# Patient Record
Sex: Female | Born: 1935 | Race: White | Hispanic: No | Smoking: Never smoker
Health system: Southern US, Community
[De-identification: ages and names within clinical notes are randomized; demographics above are authoritative.]

## PROBLEM LIST (undated history)

## (undated) DIAGNOSIS — R05 Cough: Secondary | ICD-10-CM

## (undated) DIAGNOSIS — K219 Gastro-esophageal reflux disease without esophagitis: Secondary | ICD-10-CM

## (undated) DIAGNOSIS — G43909 Migraine, unspecified, not intractable, without status migrainosus: Secondary | ICD-10-CM

## (undated) DIAGNOSIS — K449 Diaphragmatic hernia without obstruction or gangrene: Secondary | ICD-10-CM

## (undated) DIAGNOSIS — J449 Chronic obstructive pulmonary disease, unspecified: Secondary | ICD-10-CM

## (undated) DIAGNOSIS — E871 Hypo-osmolality and hyponatremia: Secondary | ICD-10-CM

## (undated) DIAGNOSIS — I4891 Unspecified atrial fibrillation: Secondary | ICD-10-CM

## (undated) DIAGNOSIS — R053 Chronic cough: Secondary | ICD-10-CM

## (undated) DIAGNOSIS — F419 Anxiety disorder, unspecified: Secondary | ICD-10-CM

## (undated) DIAGNOSIS — R32 Unspecified urinary incontinence: Secondary | ICD-10-CM

## (undated) DIAGNOSIS — E222 Syndrome of inappropriate secretion of antidiuretic hormone: Secondary | ICD-10-CM

## (undated) DIAGNOSIS — J8410 Pulmonary fibrosis, unspecified: Secondary | ICD-10-CM

## (undated) DIAGNOSIS — G459 Transient cerebral ischemic attack, unspecified: Secondary | ICD-10-CM

## (undated) DIAGNOSIS — M810 Age-related osteoporosis without current pathological fracture: Secondary | ICD-10-CM

## (undated) DIAGNOSIS — E785 Hyperlipidemia, unspecified: Secondary | ICD-10-CM

## (undated) DIAGNOSIS — J45909 Unspecified asthma, uncomplicated: Secondary | ICD-10-CM

## (undated) DIAGNOSIS — E039 Hypothyroidism, unspecified: Secondary | ICD-10-CM

## (undated) DIAGNOSIS — D649 Anemia, unspecified: Secondary | ICD-10-CM

## (undated) DIAGNOSIS — I10 Essential (primary) hypertension: Secondary | ICD-10-CM

## (undated) DIAGNOSIS — M1990 Unspecified osteoarthritis, unspecified site: Secondary | ICD-10-CM

## (undated) HISTORY — DX: Hypothyroidism, unspecified: E03.9

## (undated) HISTORY — PX: EYE SURGERY: SHX253

## (undated) HISTORY — DX: Hyperlipidemia, unspecified: E78.5

## (undated) HISTORY — DX: Unspecified osteoarthritis, unspecified site: M19.90

## (undated) HISTORY — DX: Unspecified asthma, uncomplicated: J45.909

## (undated) HISTORY — DX: Anxiety disorder, unspecified: F41.9

## (undated) HISTORY — DX: Gastro-esophageal reflux disease without esophagitis: K21.9

## (undated) HISTORY — DX: Unspecified urinary incontinence: R32

## (undated) HISTORY — DX: Diaphragmatic hernia without obstruction or gangrene: K44.9

---

## 1964-07-13 HISTORY — PX: ABDOMINAL HYSTERECTOMY: SHX81

## 2004-01-23 ENCOUNTER — Other Ambulatory Visit: Payer: Self-pay

## 2004-10-06 ENCOUNTER — Ambulatory Visit: Payer: Self-pay | Admitting: Internal Medicine

## 2005-10-14 ENCOUNTER — Inpatient Hospital Stay: Payer: Self-pay | Admitting: Internal Medicine

## 2005-10-14 ENCOUNTER — Other Ambulatory Visit: Payer: Self-pay

## 2005-10-20 ENCOUNTER — Ambulatory Visit: Payer: Self-pay | Admitting: Internal Medicine

## 2006-10-26 ENCOUNTER — Ambulatory Visit: Payer: Self-pay | Admitting: Internal Medicine

## 2007-04-11 ENCOUNTER — Inpatient Hospital Stay: Payer: Self-pay | Admitting: Internal Medicine

## 2007-04-11 ENCOUNTER — Other Ambulatory Visit: Payer: Self-pay

## 2007-06-22 ENCOUNTER — Emergency Department: Payer: Self-pay | Admitting: Emergency Medicine

## 2007-06-22 ENCOUNTER — Other Ambulatory Visit: Payer: Self-pay

## 2007-12-08 ENCOUNTER — Ambulatory Visit: Payer: Self-pay | Admitting: Internal Medicine

## 2008-03-22 ENCOUNTER — Ambulatory Visit: Payer: Self-pay | Admitting: Internal Medicine

## 2008-10-13 ENCOUNTER — Emergency Department: Payer: Self-pay | Admitting: Emergency Medicine

## 2009-10-03 ENCOUNTER — Ambulatory Visit: Payer: Self-pay | Admitting: Internal Medicine

## 2009-10-07 ENCOUNTER — Inpatient Hospital Stay: Payer: Self-pay | Admitting: Internal Medicine

## 2009-10-09 ENCOUNTER — Ambulatory Visit: Payer: Self-pay | Admitting: Internal Medicine

## 2009-10-16 ENCOUNTER — Ambulatory Visit: Payer: Self-pay | Admitting: Internal Medicine

## 2009-10-21 ENCOUNTER — Ambulatory Visit: Payer: Self-pay | Admitting: Internal Medicine

## 2009-10-25 ENCOUNTER — Ambulatory Visit: Payer: Self-pay | Admitting: Internal Medicine

## 2009-10-29 ENCOUNTER — Ambulatory Visit: Payer: Self-pay | Admitting: Internal Medicine

## 2009-11-04 ENCOUNTER — Ambulatory Visit: Payer: Self-pay | Admitting: Internal Medicine

## 2009-11-11 ENCOUNTER — Ambulatory Visit: Payer: Self-pay | Admitting: Internal Medicine

## 2009-11-12 ENCOUNTER — Ambulatory Visit: Payer: Self-pay | Admitting: Internal Medicine

## 2009-11-18 ENCOUNTER — Ambulatory Visit: Payer: Self-pay | Admitting: Internal Medicine

## 2009-11-22 ENCOUNTER — Ambulatory Visit: Payer: Self-pay | Admitting: Internal Medicine

## 2009-11-26 ENCOUNTER — Ambulatory Visit: Payer: Self-pay | Admitting: Internal Medicine

## 2009-12-02 ENCOUNTER — Ambulatory Visit: Payer: Self-pay | Admitting: Internal Medicine

## 2009-12-06 ENCOUNTER — Ambulatory Visit: Payer: Self-pay | Admitting: Internal Medicine

## 2009-12-13 ENCOUNTER — Ambulatory Visit: Payer: Self-pay | Admitting: Internal Medicine

## 2009-12-20 ENCOUNTER — Ambulatory Visit: Payer: Self-pay | Admitting: Internal Medicine

## 2009-12-27 ENCOUNTER — Ambulatory Visit: Payer: Self-pay | Admitting: Internal Medicine

## 2010-01-03 ENCOUNTER — Ambulatory Visit: Payer: Self-pay | Admitting: Internal Medicine

## 2010-01-10 ENCOUNTER — Ambulatory Visit: Payer: Self-pay | Admitting: Internal Medicine

## 2011-04-11 ENCOUNTER — Ambulatory Visit: Payer: Self-pay | Admitting: Internal Medicine

## 2011-07-14 ENCOUNTER — Ambulatory Visit: Payer: Self-pay | Admitting: Oncology

## 2011-07-14 ENCOUNTER — Ambulatory Visit: Payer: Self-pay | Admitting: Internal Medicine

## 2011-07-20 LAB — CBC WITH DIFFERENTIAL/PLATELET
Basophil #: 0 10*3/uL (ref 0.0–0.1)
Basophil %: 0.4 %
Eosinophil #: 0 10*3/uL (ref 0.0–0.7)
HGB: 12 g/dL (ref 12.0–16.0)
Lymphocyte #: 1.1 10*3/uL (ref 1.0–3.6)
MCH: 30.3 pg (ref 26.0–34.0)
MCHC: 33.6 g/dL (ref 32.0–36.0)
MCV: 90 fL (ref 80–100)
Monocyte #: 0.5 10*3/uL (ref 0.0–0.7)
Neutrophil #: 6.3 10*3/uL (ref 1.4–6.5)
Neutrophil %: 79.5 %
Platelet: 275 10*3/uL (ref 150–440)
RDW: 13.6 % (ref 11.5–14.5)
WBC: 7.9 10*3/uL (ref 3.6–11.0)

## 2011-07-20 LAB — URINALYSIS, COMPLETE
Bacteria: NONE SEEN
Blood: NEGATIVE
Protein: NEGATIVE
Specific Gravity: 1.002 (ref 1.003–1.030)
WBC UR: NONE SEEN /HPF (ref 0–5)

## 2011-07-20 LAB — BASIC METABOLIC PANEL
Anion Gap: 9 (ref 7–16)
BUN: 7 mg/dL (ref 7–18)
Calcium, Total: 8.6 mg/dL (ref 8.5–10.1)
EGFR (African American): >60
Glucose: 93 mg/dL (ref 65–99)
Potassium: 4.3 mmol/L (ref 3.5–5.1)
Sodium: 127 mmol/L — ABNORMAL LOW (ref 136–145)

## 2011-07-21 ENCOUNTER — Observation Stay: Payer: Self-pay | Admitting: Internal Medicine

## 2011-07-21 LAB — BASIC METABOLIC PANEL
Anion Gap: 9 (ref 7–16)
BUN: 7 mg/dL (ref 7–18)
Chloride: 98 mmol/L (ref 98–107)
Creatinine: 0.72 mg/dL (ref 0.60–1.30)
EGFR (African American): >60
EGFR (Non-African Amer.): >60
Glucose: 94 mg/dL (ref 65–99)
Osmolality: 270 (ref 275–301)
Sodium: 136 mmol/L (ref 136–145)

## 2011-07-21 LAB — CK TOTAL AND CKMB (NOT AT ARMC)
CK, Total: 43 U/L (ref 21–215)
CK, Total: 49 U/L (ref 21–215)
CK-MB: 1.4 ng/mL (ref 0.5–3.6)
CK-MB: 1.4 ng/mL (ref 0.5–3.6)
CK-MB: 1.5 ng/mL (ref 0.5–3.6)

## 2011-07-21 LAB — TROPONIN I
Troponin-I: 0.12 ng/mL — ABNORMAL HIGH
Troponin-I: <0.02 ng/mL

## 2011-07-29 ENCOUNTER — Inpatient Hospital Stay: Payer: Self-pay | Admitting: Internal Medicine

## 2011-07-29 LAB — CBC
MCH: 30 pg (ref 26.0–34.0)
MCHC: 33.3 g/dL (ref 32.0–36.0)
MCV: 90 fL (ref 80–100)
RBC: 3.43 10*6/uL — ABNORMAL LOW (ref 3.80–5.20)
WBC: 6.7 10*3/uL (ref 3.6–11.0)

## 2011-07-29 LAB — COMPREHENSIVE METABOLIC PANEL
Albumin: 2.9 g/dL — ABNORMAL LOW (ref 3.4–5.0)
Anion Gap: 10 (ref 7–16)
Bilirubin,Total: 0.3 mg/dL (ref 0.2–1.0)
Calcium, Total: 8.1 mg/dL — ABNORMAL LOW (ref 8.5–10.1)
Co2: 31 mmol/L (ref 21–32)
EGFR (African American): >60
EGFR (Non-African Amer.): >60
Osmolality: 248 (ref 275–301)
Potassium: 4.8 mmol/L (ref 3.5–5.1)
Sodium: 124 mmol/L — ABNORMAL LOW (ref 136–145)

## 2011-07-29 LAB — CK TOTAL AND CKMB (NOT AT ARMC)
CK, Total: 43 U/L (ref 21–215)
CK-MB: 2.5 ng/mL (ref 0.5–3.6)

## 2011-07-30 LAB — BASIC METABOLIC PANEL
BUN: 6 mg/dL — ABNORMAL LOW (ref 7–18)
Chloride: 85 mmol/L — ABNORMAL LOW (ref 98–107)
Co2: 29 mmol/L (ref 21–32)
Creatinine: 0.58 mg/dL — ABNORMAL LOW (ref 0.60–1.30)
Potassium: 4.4 mmol/L (ref 3.5–5.1)

## 2011-07-30 LAB — CBC WITH DIFFERENTIAL/PLATELET
Basophil #: 0 10*3/uL (ref 0.0–0.1)
Basophil %: 0 %
Eosinophil %: 0 %
HCT: 30.4 % — ABNORMAL LOW (ref 35.0–47.0)
Lymphocyte #: 0.7 10*3/uL — ABNORMAL LOW (ref 1.0–3.6)
Lymphocyte %: 7.5 %
MCH: 29.8 pg (ref 26.0–34.0)
MCV: 90 fL (ref 80–100)
Monocyte #: 0.5 10*3/uL (ref 0.0–0.7)
Monocyte %: 5.8 %
Neutrophil #: 7.9 10*3/uL — ABNORMAL HIGH (ref 1.4–6.5)
Platelet: 244 10*3/uL (ref 150–440)
RBC: 3.4 10*6/uL — ABNORMAL LOW (ref 3.80–5.20)
WBC: 9.1 10*3/uL (ref 3.6–11.0)

## 2011-07-30 LAB — TSH: Thyroid Stimulating Horm: 6.21 u[IU]/mL — ABNORMAL HIGH

## 2011-07-30 LAB — SODIUM, URINE, RANDOM: Sodium, Urine Random: 38 mmol/L (ref 20–110)

## 2011-07-30 LAB — TROPONIN I: Troponin-I: <0.02 ng/mL

## 2011-07-30 LAB — CK TOTAL AND CKMB (NOT AT ARMC): CK, Total: 31 U/L (ref 21–215)

## 2011-07-31 LAB — BASIC METABOLIC PANEL
Calcium, Total: 7.9 mg/dL — ABNORMAL LOW (ref 8.5–10.1)
Chloride: 79 mmol/L — ABNORMAL LOW (ref 98–107)
Co2: 33 mmol/L — ABNORMAL HIGH (ref 21–32)
EGFR (Non-African Amer.): >60
Osmolality: 238 (ref 275–301)
Potassium: 4.8 mmol/L (ref 3.5–5.1)
Sodium: 119 mmol/L — CL (ref 136–145)

## 2011-08-01 LAB — BASIC METABOLIC PANEL
Calcium, Total: 8.5 mg/dL (ref 8.5–10.1)
Chloride: 78 mmol/L — ABNORMAL LOW (ref 98–107)
Co2: 31 mmol/L (ref 21–32)
Creatinine: 0.45 mg/dL — ABNORMAL LOW (ref 0.60–1.30)
EGFR (African American): >60
EGFR (Non-African Amer.): >60
Glucose: 134 mg/dL — ABNORMAL HIGH (ref 65–99)
Osmolality: 241 (ref 275–301)
Sodium: 119 mmol/L — CL (ref 136–145)

## 2011-08-01 LAB — MAGNESIUM: Magnesium: 1.6 mg/dL — ABNORMAL LOW

## 2011-08-01 LAB — URIC ACID: Uric Acid: 1.9 mg/dL — ABNORMAL LOW (ref 2.6–6.0)

## 2011-08-02 LAB — BASIC METABOLIC PANEL
BUN: 11 mg/dL (ref 7–18)
Calcium, Total: 8.8 mg/dL (ref 8.5–10.1)
Co2: 32 mmol/L (ref 21–32)
EGFR (African American): >60
Glucose: 169 mg/dL — ABNORMAL HIGH (ref 65–99)
Osmolality: 242 (ref 275–301)
Potassium: 4.9 mmol/L (ref 3.5–5.1)
Sodium: 118 mmol/L — CL (ref 136–145)

## 2011-08-02 LAB — SODIUM: Sodium: 123 mmol/L — ABNORMAL LOW (ref 136–145)

## 2011-08-03 LAB — BASIC METABOLIC PANEL
BUN: 13 mg/dL (ref 7–18)
Calcium, Total: 8.5 mg/dL (ref 8.5–10.1)
EGFR (African American): >60
EGFR (Non-African Amer.): >60
Glucose: 116 mg/dL — ABNORMAL HIGH (ref 65–99)
Osmolality: 249 (ref 275–301)
Potassium: 5.9 mmol/L — ABNORMAL HIGH (ref 3.5–5.1)
Sodium: 123 mmol/L — ABNORMAL LOW (ref 136–145)

## 2011-08-04 LAB — BASIC METABOLIC PANEL
Anion Gap: 10 (ref 7–16)
Calcium, Total: 8.3 mg/dL — ABNORMAL LOW (ref 8.5–10.1)
Chloride: 82 mmol/L — ABNORMAL LOW (ref 98–107)
Co2: 36 mmol/L — ABNORMAL HIGH (ref 21–32)
Osmolality: 257 (ref 275–301)
Potassium: 4.6 mmol/L (ref 3.5–5.1)

## 2011-08-04 LAB — MAGNESIUM: Magnesium: 1.5 mg/dL — ABNORMAL LOW

## 2011-08-04 LAB — CA 125: CA 125: 128.7 U/mL — ABNORMAL HIGH (ref 0.0–34.0)

## 2011-08-04 LAB — PROTEIN ELECTROPHORESIS(ARMC)

## 2011-08-05 LAB — BASIC METABOLIC PANEL
Anion Gap: 7 (ref 7–16)
BUN: 16 mg/dL (ref 7–18)
Chloride: 85 mmol/L — ABNORMAL LOW (ref 98–107)
Co2: 38 mmol/L — ABNORMAL HIGH (ref 21–32)
Creatinine: 0.57 mg/dL — ABNORMAL LOW (ref 0.60–1.30)
EGFR (African American): >60
Glucose: 124 mg/dL — ABNORMAL HIGH (ref 65–99)
Osmolality: 263 (ref 275–301)
Sodium: 130 mmol/L — ABNORMAL LOW (ref 136–145)

## 2011-08-05 LAB — MAGNESIUM: Magnesium: 1.7 mg/dL — ABNORMAL LOW

## 2011-08-05 LAB — URINE IEP, RANDOM

## 2011-08-06 LAB — BASIC METABOLIC PANEL
Anion Gap: 5 — ABNORMAL LOW (ref 7–16)
Calcium, Total: 8.1 mg/dL — ABNORMAL LOW (ref 8.5–10.1)
Chloride: 85 mmol/L — ABNORMAL LOW (ref 98–107)
Co2: 37 mmol/L — ABNORMAL HIGH (ref 21–32)
Creatinine: 0.43 mg/dL — ABNORMAL LOW (ref 0.60–1.30)
EGFR (African American): >60
Potassium: 3.8 mmol/L (ref 3.5–5.1)
Sodium: 127 mmol/L — ABNORMAL LOW (ref 136–145)

## 2011-08-10 ENCOUNTER — Inpatient Hospital Stay: Payer: Self-pay | Admitting: Internal Medicine

## 2011-08-10 LAB — LIPID PANEL
HDL Cholesterol: 106 mg/dL — ABNORMAL HIGH (ref 40–60)
Triglycerides: 73 mg/dL (ref 0–200)
VLDL Cholesterol, Calc: 15 mg/dL (ref 5–40)

## 2011-08-10 LAB — CK TOTAL AND CKMB (NOT AT ARMC)
CK, Total: 20 U/L — ABNORMAL LOW (ref 21–215)
CK, Total: 22 U/L (ref 21–215)
CK-MB: 0.8 ng/mL (ref 0.5–3.6)

## 2011-08-10 LAB — COMPREHENSIVE METABOLIC PANEL
Anion Gap: 10 (ref 7–16)
Bilirubin,Total: 0.8 mg/dL (ref 0.2–1.0)
Calcium, Total: 8.5 mg/dL (ref 8.5–10.1)
Chloride: 88 mmol/L — ABNORMAL LOW (ref 98–107)
Co2: 30 mmol/L (ref 21–32)
Creatinine: 0.38 mg/dL — ABNORMAL LOW (ref 0.60–1.30)
EGFR (African American): >60
EGFR (Non-African Amer.): >60
Glucose: 91 mg/dL (ref 65–99)
Osmolality: 257 (ref 275–301)
Potassium: 3.8 mmol/L (ref 3.5–5.1)
SGOT(AST): 27 U/L (ref 15–37)
Sodium: 128 mmol/L — ABNORMAL LOW (ref 136–145)

## 2011-08-10 LAB — CBC
HCT: 39.7 % (ref 35.0–47.0)
MCHC: 33.1 g/dL (ref 32.0–36.0)
Platelet: 312 10*3/uL (ref 150–440)
RBC: 4.43 10*6/uL (ref 3.80–5.20)
RDW: 13.8 % (ref 11.5–14.5)
WBC: 13 10*3/uL — ABNORMAL HIGH (ref 3.6–11.0)

## 2011-08-10 LAB — URINALYSIS, COMPLETE
Bilirubin,UR: NEGATIVE
Blood: NEGATIVE
Glucose,UR: NEGATIVE mg/dL (ref 0–75)
Specific Gravity: 1.006 (ref 1.003–1.030)

## 2011-08-10 LAB — TROPONIN I
Troponin-I: 0.03 ng/mL
Troponin-I: <0.02 ng/mL

## 2011-08-11 LAB — COMPREHENSIVE METABOLIC PANEL
Alkaline Phosphatase: 78 U/L (ref 50–136)
Anion Gap: 11 (ref 7–16)
Bilirubin,Total: 0.8 mg/dL (ref 0.2–1.0)
Calcium, Total: 8.2 mg/dL — ABNORMAL LOW (ref 8.5–10.1)
Chloride: 90 mmol/L — ABNORMAL LOW (ref 98–107)
Co2: 26 mmol/L (ref 21–32)
EGFR (African American): >60
EGFR (Non-African Amer.): >60
Glucose: 77 mg/dL (ref 65–99)
SGOT(AST): 17 U/L (ref 15–37)
SGPT (ALT): 23 U/L

## 2011-08-11 LAB — CBC WITH DIFFERENTIAL/PLATELET
Basophil #: 0 10*3/uL (ref 0.0–0.1)
Basophil %: 0.1 %
Eosinophil #: 0 10*3/uL (ref 0.0–0.7)
HGB: 13.1 g/dL (ref 12.0–16.0)
Lymphocyte #: 1.3 10*3/uL (ref 1.0–3.6)
Lymphocyte %: 7.3 %
MCH: 30.1 pg (ref 26.0–34.0)
MCHC: 33.1 g/dL (ref 32.0–36.0)
Neutrophil %: 86.8 %
Platelet: 315 10*3/uL (ref 150–440)
RBC: 4.36 10*6/uL (ref 3.80–5.20)
RDW: 13.8 % (ref 11.5–14.5)

## 2011-08-12 LAB — BASIC METABOLIC PANEL
BUN: 11 mg/dL (ref 7–18)
Creatinine: 0.41 mg/dL — ABNORMAL LOW (ref 0.60–1.30)
EGFR (African American): >60
EGFR (Non-African Amer.): >60
Glucose: 75 mg/dL (ref 65–99)
Sodium: 128 mmol/L — ABNORMAL LOW (ref 136–145)

## 2011-08-13 LAB — BASIC METABOLIC PANEL
Anion Gap: 10 (ref 7–16)
Calcium, Total: 8.1 mg/dL — ABNORMAL LOW (ref 8.5–10.1)
Chloride: 94 mmol/L — ABNORMAL LOW (ref 98–107)
Co2: 27 mmol/L (ref 21–32)
EGFR (African American): >60
EGFR (Non-African Amer.): >60
Glucose: 75 mg/dL (ref 65–99)
Sodium: 131 mmol/L — ABNORMAL LOW (ref 136–145)

## 2011-08-14 ENCOUNTER — Ambulatory Visit: Payer: Self-pay | Admitting: Internal Medicine

## 2011-08-14 ENCOUNTER — Ambulatory Visit: Payer: Self-pay | Admitting: Oncology

## 2011-08-14 LAB — BASIC METABOLIC PANEL
Anion Gap: 9 (ref 7–16)
BUN: 7 mg/dL (ref 7–18)
Calcium, Total: 8.3 mg/dL — ABNORMAL LOW (ref 8.5–10.1)
Chloride: 95 mmol/L — ABNORMAL LOW (ref 98–107)
Co2: 30 mmol/L (ref 21–32)
Glucose: 79 mg/dL (ref 65–99)
Potassium: 3.5 mmol/L (ref 3.5–5.1)
Sodium: 134 mmol/L — ABNORMAL LOW (ref 136–145)

## 2011-08-15 LAB — BASIC METABOLIC PANEL
Anion Gap: 9 (ref 7–16)
Calcium, Total: 8 mg/dL — ABNORMAL LOW (ref 8.5–10.1)
Chloride: 99 mmol/L (ref 98–107)
Co2: 28 mmol/L (ref 21–32)
Creatinine: 0.61 mg/dL (ref 0.60–1.30)
EGFR (African American): >60
EGFR (Non-African Amer.): >60
Glucose: 93 mg/dL (ref 65–99)
Osmolality: 272 (ref 275–301)

## 2011-08-15 LAB — URINALYSIS, COMPLETE
Blood: NEGATIVE
Glucose,UR: NEGATIVE mg/dL (ref 0–75)
Leukocyte Esterase: NEGATIVE
Nitrite: NEGATIVE
Protein: NEGATIVE
RBC,UR: <1 /HPF (ref 0–5)
Specific Gravity: 1.011 (ref 1.003–1.030)
WBC UR: 3 /HPF (ref 0–5)

## 2011-08-15 LAB — PLATELET COUNT: Platelet: 219 10*3/uL (ref 150–440)

## 2011-08-16 LAB — BASIC METABOLIC PANEL
BUN: 12 mg/dL (ref 7–18)
Calcium, Total: 8.4 mg/dL — ABNORMAL LOW (ref 8.5–10.1)
Co2: 31 mmol/L (ref 21–32)
EGFR (African American): >60
EGFR (Non-African Amer.): >60
Osmolality: 265 (ref 275–301)
Sodium: 133 mmol/L — ABNORMAL LOW (ref 136–145)

## 2011-08-16 LAB — CBC WITH DIFFERENTIAL/PLATELET
Basophil #: 0 10*3/uL (ref 0.0–0.1)
Eosinophil %: 2.1 %
MCV: 89 fL (ref 80–100)
Monocyte %: 9 %
Neutrophil #: 4.6 10*3/uL (ref 1.4–6.5)
Neutrophil %: 69.9 %
Platelet: 230 10*3/uL (ref 150–440)
RBC: 3.62 10*6/uL — ABNORMAL LOW (ref 3.80–5.20)
RDW: 14 % (ref 11.5–14.5)
WBC: 6.6 10*3/uL (ref 3.6–11.0)

## 2011-08-16 LAB — CULTURE, BLOOD (SINGLE)

## 2011-08-17 LAB — BASIC METABOLIC PANEL
Anion Gap: 8 (ref 7–16)
BUN: 10 mg/dL (ref 7–18)
Calcium, Total: 8.3 mg/dL — ABNORMAL LOW (ref 8.5–10.1)
Chloride: 96 mmol/L — ABNORMAL LOW (ref 98–107)
Co2: 31 mmol/L (ref 21–32)
Creatinine: 0.55 mg/dL — ABNORMAL LOW (ref 0.60–1.30)
EGFR (African American): >60
Potassium: 3.9 mmol/L (ref 3.5–5.1)
Sodium: 135 mmol/L — ABNORMAL LOW (ref 136–145)

## 2012-06-28 ENCOUNTER — Emergency Department: Payer: Self-pay | Admitting: Emergency Medicine

## 2012-06-28 LAB — CBC WITH DIFFERENTIAL/PLATELET
Eosinophil #: 0.1 10*3/uL (ref 0.0–0.7)
Eosinophil %: 1.4 %
HCT: 32.4 % — ABNORMAL LOW (ref 35.0–47.0)
Lymphocyte #: 0.9 10*3/uL — ABNORMAL LOW (ref 1.0–3.6)
MCH: 30.2 pg (ref 26.0–34.0)
MCHC: 33.8 g/dL (ref 32.0–36.0)
Monocyte #: 1 x10 3/mm — ABNORMAL HIGH (ref 0.2–0.9)
Neutrophil #: 8.3 10*3/uL — ABNORMAL HIGH (ref 1.4–6.5)
Neutrophil %: 79.6 %
Platelet: 325 10*3/uL (ref 150–440)

## 2012-06-28 LAB — COMPREHENSIVE METABOLIC PANEL
Albumin: 3.7 g/dL (ref 3.4–5.0)
Alkaline Phosphatase: 146 U/L — ABNORMAL HIGH (ref 50–136)
BUN: 14 mg/dL (ref 7–18)
Calcium, Total: 9.1 mg/dL (ref 8.5–10.1)
EGFR (Non-African Amer.): >60
Glucose: 110 mg/dL — ABNORMAL HIGH (ref 65–99)
Osmolality: 260 (ref 275–301)
Potassium: 4.2 mmol/L (ref 3.5–5.1)
SGPT (ALT): 20 U/L (ref 12–78)
Sodium: 129 mmol/L — ABNORMAL LOW (ref 136–145)
Total Protein: 7.2 g/dL (ref 6.4–8.2)

## 2012-06-28 LAB — CK TOTAL AND CKMB (NOT AT ARMC): CK, Total: 61 U/L (ref 21–215)

## 2012-06-28 LAB — TROPONIN I: Troponin-I: <0.02 ng/mL

## 2012-08-11 ENCOUNTER — Inpatient Hospital Stay: Payer: Self-pay | Admitting: Internal Medicine

## 2012-08-11 LAB — TSH: Thyroid Stimulating Horm: 9.34 u[IU]/mL — ABNORMAL HIGH

## 2012-08-11 LAB — COMPREHENSIVE METABOLIC PANEL
Albumin: 3.5 g/dL (ref 3.4–5.0)
Alkaline Phosphatase: 126 U/L (ref 50–136)
Anion Gap: 8 (ref 7–16)
BUN: 17 mg/dL (ref 7–18)
Bilirubin,Total: 0.2 mg/dL (ref 0.2–1.0)
Chloride: 95 mmol/L — ABNORMAL LOW (ref 98–107)
Co2: 25 mmol/L (ref 21–32)
Creatinine: 0.65 mg/dL (ref 0.60–1.30)
EGFR (African American): >60
SGPT (ALT): 17 U/L (ref 12–78)
Sodium: 128 mmol/L — ABNORMAL LOW (ref 136–145)
Total Protein: 7.2 g/dL (ref 6.4–8.2)

## 2012-08-11 LAB — CK TOTAL AND CKMB (NOT AT ARMC)
CK, Total: 63 U/L (ref 21–215)
CK-MB: 1.4 ng/mL (ref 0.5–3.6)

## 2012-08-11 LAB — CBC
HCT: 31.6 % — ABNORMAL LOW (ref 35.0–47.0)
MCH: 31 pg (ref 26.0–34.0)
MCHC: 33.8 g/dL (ref 32.0–36.0)
MCV: 92 fL (ref 80–100)
RDW: 13.5 % (ref 11.5–14.5)
WBC: 9.9 10*3/uL (ref 3.6–11.0)

## 2012-08-11 LAB — PROTIME-INR
INR: 0.9
Prothrombin Time: 12.4 secs (ref 11.5–14.7)

## 2012-08-11 LAB — URINALYSIS, COMPLETE
Bilirubin,UR: NEGATIVE
Hyaline Cast: 1
Ketone: NEGATIVE
Ph: 6 (ref 4.5–8.0)
Protein: NEGATIVE
RBC,UR: 1 /HPF (ref 0–5)
Squamous Epithelial: 1
WBC UR: 62 /HPF (ref 0–5)

## 2012-08-11 LAB — TROPONIN I: Troponin-I: <0.02 ng/mL

## 2012-08-12 LAB — CBC WITH DIFFERENTIAL/PLATELET
Basophil #: 0.1 10*3/uL (ref 0.0–0.1)
Eosinophil %: 1.9 %
HCT: 28.4 % — ABNORMAL LOW (ref 35.0–47.0)
HGB: 9.9 g/dL — ABNORMAL LOW (ref 12.0–16.0)
Lymphocyte #: 1.3 10*3/uL (ref 1.0–3.6)
Lymphocyte %: 17.6 %
MCH: 32.7 pg (ref 26.0–34.0)
MCHC: 35 g/dL (ref 32.0–36.0)
Neutrophil #: 4.8 10*3/uL (ref 1.4–6.5)
RBC: 3.04 10*6/uL — ABNORMAL LOW (ref 3.80–5.20)
RDW: 13.4 % (ref 11.5–14.5)

## 2012-08-12 LAB — BASIC METABOLIC PANEL
Anion Gap: 7 (ref 7–16)
BUN: 12 mg/dL (ref 7–18)
Chloride: 102 mmol/L (ref 98–107)
Co2: 25 mmol/L (ref 21–32)
EGFR (African American): >60
EGFR (Non-African Amer.): >60
Osmolality: 267 (ref 275–301)
Potassium: 4.3 mmol/L (ref 3.5–5.1)
Sodium: 134 mmol/L — ABNORMAL LOW (ref 136–145)

## 2012-08-12 LAB — SODIUM, URINE, RANDOM: Sodium, Urine Random: 33 mmol/L (ref 20–110)

## 2012-08-12 LAB — OSMOLALITY, URINE: Osmolality: 365 mOsm/kg

## 2012-08-14 LAB — URINE CULTURE

## 2014-11-02 NOTE — H&P (Signed)
DATE OF BIRTH:  02/28/1919  CHIEF COMPLAINT:  Syncopal episode.   PRIMARY CARE PHYSICIAN:  Dr. Yates Decamp from [facility].   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   The patient comes today with a history of being in the beauty parlor under the dryer for a while. After she was asked to move into the beautician chair, the patient walked several steps, sat down in the chair and then collapsed leaning down to the right. She was not responsive, not talking, and she kept her eyes closed. She was breathing without any difficulty. She woke up, talked for a couple of sentences and then she passed out again, kept her eyes closed, and she was a little bit limp and not answering for commands. There was never any slurred speech associated with this prior to the event or after the event. She was alert, oriented x 3 after a while. She was brought in to be evaluated. At the ER, she was found to have a urinary tract infection with 62 white blood cells, positive leukocyte esterase, but negative nitrites. Her hemoglobin was around 10, and her TSH has been slightly elevated at 9.34. Her vital signs were all stable, and she was afebrile.   REVIEW OF SYSTEMS:   GENERAL:  A 12-system review of systems was done. The patient denies any fever, fatigue, significant weakness, weight loss or weight gain.  EYES:  Denies any blurry vision or redness. She had cataract surgery.  ENT:  Denies any tinnitus, difficulty swallowing or nasal discharge.  RESPIRATORY:  No cough. No  wheezing prior to today. Apparently, she was wheezing here at the ER, for which respiratory treatments were given. Right now, she is clear. There is no painful respiration or pneumonias at this moment. She has COPD and asthma.  CARDIOVASCULAR:  No chest pain, no orthopnea, no edema, no arrhythmias, no palpitations. Positive syncope today, but not prior to today.  GASTROINTESTINAL:  No nausea, vomiting, diarrhea. Normal bowel movements. No hematemesis or melena.  GENITOURINARY:  The patient denies any dysuria or hematuria. She feels like she has not been urinating as much lately.  GYNECOLOGIC:  No breast masses. The patient had a hysterectomy in the past.  HEMATOLOGY/LYMPHATIC:  No anemia, easy bruising or bleeding.  ENDOCRINOLOGY:  No polyuria, polydipsia or polyphagia. No cold or heat intolerance. She is on thyroid replacement, and at this moment, her TSH is elevated, but she is asymptomatic.  SKIN:  No acne, rashes or lesions.  NEUROLOGIC:  No numbness, tingling, vertigo or ataxia. Positive for TIAs and CVAs in the past. She has been recently started on aspirin on top of her Plavix due to a past TIA back in December. Primary care physician, Dr. Dan Humphreys, did not want to go ahead and do warfarin on her, which would be her next step, which I agree with since the patient will have a high risk of bleeding at the age of 42 due to falls.  PSYCHIATRIC:  No anxiety or insomnia or depression.   PAST MEDICAL HISTORY:  1.  TIAs.  2.  Previous CVA.  3.  COPD.  4.  Asthma.  5.  Hypothyroidism.  6.  Osteoporosis.  7.  GERD.  8.  Hiatal hernia.  9.  Chronic anemia.  10.  History of migraine headaches.  11.  Hypertension, uncontrolled.  12.  Fibrocystic breast disease.  13.  History of ovarian mass, status post hysterectomy.  14.  Chronic anxiety, which at this moment is stable.   ALLERGIES:  CODEINE,, DITROPAN, FLAGYL, HYDROCHLOROTHIAZIDE, PROPRANOLOL, TRAMADOL, and VICODIN. see other allergies on  allscript.  FAMILY HISTORY:  Denies any hypertension, diabetes or MIs in her family.   SOCIAL HISTORY:  The patient used to live alone up until last year. She was hospitalized several times, for which she moved in with her daughter after she was discharged from rehab. She ambulates with a walker. She does not smoke or drink.   CURRENT MEDICATIONS:  Include losartan 50 mg once a day, clonidine 0.1 mg every 8 hours, Nitrol patch every day 0.6 mg, nitroglycerin sublingual p.r.n., Ativan 0.5 mg every 8 hours, Plavix 75 mg once a day, Spiriva 80 mcg once daily, amlodipine 10 mg once a day, ranitidine 150 mg twice daily, ferrous sulfate 325 mg twice daily, Colace 100 mg twice daily, MiraLax as needed for constipation, Flonase nasal spray once a day, fluticasone inhaler 110 mcg twice daily, levothyroxine 100 mcg once a day, multivitamin once daily, vitamin B12 at 1000 mcg once daily, vitamin C 500 mg once daily, vitamin D 3000 mg once daily.   PHYSICAL EXAMINATION:  VITAL SIGNS:  Blood pressure 150/65, pulse 66, respiratory rate 18, temperature 97, O2 sats 97% on room air.  GENERAL:  The patient is alert and oriented x 3. No acute distress. No respiratory distress. Hemodynamically stable.  HEENT:  Her pupils are equal and reactive. Extraocular movements are intact. Anicteric sclerae. Pink conjunctivae. Mucosa are overall moist. No oral lesions. No oropharyngeal exudates.  NECK:  Supple. No JVD. No thyromegaly. No adenopathy. No carotid bruits. Normal range of motion. No rigidity.  CARDIOVASCULAR:  Regular rate and rhythm. Positive systolic ejection murmur 2/6. The patient knows about this murmur. She has been told all her life she had it. No rubs or gallops. No displacement of PMI. No tenderness to palpation of anterior chest wall.  LUNGS:  Clear without any wheezing or crepitus at this moment. She is status post a respiratory treatment. No use of accessory muscles. No dullness to percussion.  ABDOMEN:   Soft, nontender, nondistended. No hepatosplenomegaly. No masses. Bowel sounds are positive.  GENITAL:  Exam negative for external lesions.  EXTREMITIES:  No edema, no cyanosis, no clubbing.  SKIN:  Without any rashes or petechiae. Has good turgor.  LYMPHATIC:  Negative for lymphadenopathies in neck or supraclavicular areas.  NEUROLOGIC:  Cranial nerves II through XII intact. No focal findings. Strength is 5/5 in all 4 extremities. Sensation is normal.  PSYCHIATRIC:  Negative for agitation or significant depression.  MUSCULOSKELETAL:  No joint effusions or joint deformities.   LABORATORIES:  Glucose 98, BUN 17, sodium 128, potassium 4.3. LFTs within normal limits. TSH 9.34. Hemoglobin of 10.7, red blood cells of 345, hematocrit 31, white blood cells 9.9 and platelets 327. UA shows white blood cells of 62 with positive leukocyte esterase +2, negative nitrites, good sample with only 1 epithelial cell. EKG, normal sinus rhythm with bradycardia at 57. CT of the head shows atrophy with chronic microvascular ischemic disease, no acute intracranial abnormalities, bilateral basal ganglia lacunar infarcts are present.   ASSESSMENT AND PLAN:  A 81 year old female with history of cerebrovascular accidents, transient ischemic attacks, hypothyroidism, osteoporosis, uncontrolled hypertension, migraines, anemia, admitted for a syncopal episode.  1.  Syncope. The patient is admitted due to syncope and collapse, likely related to orthostatic hypotension. Players on these event would be urinary tract infection, as the patient has 61 white blood cells and may have some symptoms of urinary tract infection in the past couple days. Orthostatic hypotension related to being in a beauty parlor under a dryer for a while, getting up and walking around, and having decreased high blood pressure. Due to all this, I also cannot rule out the possibility of a cerebrovascular accident or transient ischemic attack. We are going to keep her  on observation, telemetry monitoring, neurologic checks p.r.n. and also fluid restriction due to her syndrome of inappropriate antidiuretic hormone secretion with close fluid ins and outs monitoring every 4 hours.  2.  The patient also had a bradycardia and elevated TSH, which could be related also to the syncopal episode or be playing a role in the syncopal episode. The patient is going to be admitted and transferred to the care of Dr. Dan Humphreys in the morning. Due to her bradycardia, the patient is just going to be on telemetry. She is not on a beta blocker, but the bradycardia could be related to uncontrolled hypothyroidism.  3.  Uncontrolled hypertension. The patient has a high blood pressure. We will continue all her medications, except for the nitroglycerin transdermal patch. She does not have a history of coronary artery disease, as far as we know. I will let Dr. Dan Humphreys decide if he wants to continue that, but she has a lot of blood pressure medications, and that could be causing orthostatic hypotension. I might suggest for this patient to have permissive hypertension to a certain degree since she is 68 with a high risk of falls.  4.  Transient ischemic attacks. The patient is on Plavix and aspirin. The patient her last transient ischemic attack in December 2013, and this is when she was started on aspirin. She is not on a statin, which I am not going to start. Due to her fragility, she could have significant complications out of this medication, plus at the age of 71, she might not benefit from starting one anyway.  5.  History of anemia. Her hemoglobin is stable around 10. No signs of bleeding.  6.  Gastroesophageal reflux. Continue ranitidine.  7.  Syndrome of inappropriate antidiuretic hormone secretion. The patient has a sodium, as per family, that usually runs around 130 to 131. Today, it is 128. She does not look fluid overloaded or dehydrated. I am going to order a urine osmolarity, urine sodium,  to see if this is really syndrome of inappropriate antidiuretic hormone secretion. She does not look dehydrated, as I said. She has been given some fluids here in the ER by the ER physician, but I think at this moment, I will just try to pursue gentle fluid restriction and monitor her fluid intake and outtake very closely.  8.  Urinary tract infection. The patient is going to be started on Rocephin, and follow up urine cultures. This might be playing a role in her syncopal episode today.  9.  Deep vein thrombosis prophylaxis. As the patient is already on Plavix and aspirin, we are going to continue that with sequential compression devices, and for gastrointestinal prophylaxis, the patient is on ranitidine.  10.  Other medical problems seem to be stable except for her  hypothyroidism, which I already said is off, with her TSH of 9.0. We are going to increase the dose of Synthroid to 125 mcg. She needs to recheck this within the next 4 to 6 weeks.   Total time with this admission 45 minutes. The patient is a FULL CODE at this moment. The patient is going to be transferred to Dr. Tilman Neat service.    ____________________________ Felipa Furnace, MD rsg:ms D: 08/11/2012 20:51:14 ET T: 08/11/2012 23:25:54 ET JOB#: [id number]  cc: Felipa Furnace, MD, <Dictator> Alanni Vader Juanda Chance MD ELECTRONICALLY SIGNED 08/23/2012 13:24

## 2014-11-04 NOTE — Discharge Summary (Signed)
PATIENT NAME:  Alyssa Landry, Alyssa Landry MR#:  [id number] DATE OF BIRTH:  May 24, 1919  DATE OF ADMISSION:  08/10/2011 DATE OF DISCHARGE:  08/17/2011  HISTORY OF PRESENT ILLNESS: Alyssa Landry is a 81 year old white lady who recently had been admitted for hypertensive crisis and then transferred to rehab for reconditioning. The patient apparently did well according to the daughter the first 2 or 3 days but then had the sudden onset of acute confusion. She started having slurred speech and was hard to arouse. The patient was brought back to the Emergency Room for evaluation where she was evaluated by the hospitalist service and admitted.   PAST MEDICAL HISTORY: 1. Asthma. 2. Hypothyroidism.  3. Osteoporosis.  4. Esophageal reflux disease.  5. Chronic anemia. 6. Hypertension. 7. Chronic hyponatremia. 8. History of transient ischemic attacks. 9. History of a left adnexal mass noted on her last admission.   ADMISSION MEDICATIONS:  1. Amlodipine 10 mg daily.  2. Nitroglycerin 0.4 mg per hour patch. 3. Plavix 75 mg daily.  4. Spiriva 1 puff daily.  5. Vitamin B12 1000 mcg daily.  6. Vitamin D3 1000 mg daily.  7. Flovent 110 mcg 2 puffs b.i.d.  8. Clonidine 0.2 mg every eight hours. 9. Levoxyl 100 mcg daily.  10. Protonix 20 mg daily.  11. Tranxene 3.75 mg t.i.d. p.r.n.   ALLERGIES: The patient was noted to be intolerant to aspirin, codeine, Ditropan, Flagyl, hydrochlorothiazide, propranolol, Tavist, tramadol, and Vicodin.   SOCIAL HISTORY AND FAMILY HISTORY: Unchanged as per the last admission.   ADMISSION PHYSICAL EXAMINATION: Temperature 97, pulse 74, respirations 18, blood pressure 200/81. Oxygen saturation was 97% on room air. Exam as described by the admitting physician was notable for some scattered wheezes on pulmonary exam. CARDIAC: Regular rhythm with a grade 3/6 systolic murmur. Some ankle edema was noted. NEUROLOGIC: Neurological exam was felt to be compromised by the patient's inability  to cooperate but no focal neurological findings were found.   LABORATORY, DIAGNOSTIC AND RADIOLOGIC DATA: The patient's admission CBC showed a hemoglobin of 13.1 with a hematocrit of 39.7. White count was 13,000. Platelet count was 312,000. Admission comprehensive metabolic panel was notable for a sodium of 128 and a chloride of 88. Albumin was 2.8. Admission urinalysis showed 20 RBCs per high-power field and 9 WBCs per high-power field. There was trace leukocyte esterase on the dipstick. Blood cultures drawn on admission eventually showed no growth. Urine culture eventually grew out 100,000 colonies of a coag negative Staph. Admission EKG shows normal sinus rhythm with left anterior fascicular block. There was evidence of an old septal infarction. Echocardiogram showed a normal ejection fraction of 50%. There was mild to moderate mitral regurgitation. There was also mild to moderate tricuspid regurgitation. There was no clot.   Admission head CT without contrast showed no acute changes. An MRI of the brain without contrast showed chronic microangiopathy but no acute infarct. Carotid Doppler's showed no stenosis. Chest x-ray showed atelectasis in the right base. There was a persistent effusion on the left. There were changes consistent with chronic obstructive pulmonary disease and pulmonary fibrosis.   HOSPITAL COURSE: The patient was admitted to the regular medical floor where she was placed on IV antibiotics by the admitting physician with a tentative diagnosis of possible pneumonia. She was eventually seen by both Neurology and Behavioral Medicine. It was Neurology's opinion that her altered mental status was metabolic encephalopathy secondary to urinary tract infection. She was subsequently not felt to have pneumonia. She was seen in  consultation by Behavioral Medicine because of chronic anxiety. She was placed on Zyprexa which seemed to help smooth out her mood. The patient's blood pressure remained a  problem on this hospitalization and there were some minor alterations made in her medication for better blood pressure control. The patient was continued on a fluid restriction diet because of her chronic hyponatremia. Her final chemistry panel showed a BUN of 10 with a creatinine of 0.55. Sodium was 135, potassium 3.9, and chloride 96. The patient was re-evaluated by physical therapy while she was in the hospital and appeared to be gaining strength on the days that she was not confused. The patient is now being transferred back to a rehab facility for further reconditioning.   DISCHARGE MEDICATIONS:  1. Spiriva 1 puff daily.  2. Nitro-Dur patch 0.6 mg daily.  3. Flovent 110 mcg inhaler 2 puffs b.i.d.  4. Norvasc 10 mg daily.  5. Levaquin 250 mg for an additional three days.  6. Plavix 75 mg daily.  7. Ativan 0.5 mg q.8 hours p.r.n.  8. Vitamin B12 1000 mcg daily.  9. Vitamin D3 1000 units daily.  10. Zyprexa 2.5 mg daily.  11. Clonidine 0.1 mg q.8 hours.  12. Synthroid 0.1 mg daily.  13. Colace 100 mg b.i.d.   14. Milk of Magnesia 30 mL daily p.r.n.  15. Cozaar 50 mg daily.   DISCHARGE DISPOSITION: The patient is being discharged on a mechanical soft diet. The patient is on a 1 liter fluid restriction.   PROGNOSIS: The patient's prognosis remains somewhat guarded.   CODE STATUS: The patient is a NO CODE.   ____________________________ Letta Pate. Danne Harbor, MD jbw:drc D: 08/17/2011 07:06:47 ET T: 08/17/2011 08:08:12 ET JOB#: [id number]  cc: Jonny Ruiz B. Danne Harbor, MD, <Dictator> Elmo Putt III MD ELECTRONICALLY SIGNED 08/20/2011 10:31

## 2014-11-04 NOTE — H&P (Signed)
PATIENT NAME:  Alyssa Landry, Alyssa Landry MR#:  [id number] DATE OF BIRTH:  10/20/1918  DATE OF ADMISSION:  07/20/2011  PRIMARY MD: Dr. Dan HumphreysWalker   ED REFERRING DOCTOR: Dr. Pamala Duffelobb Wiegand   CHIEF COMPLAINT: Substernal pressure, headache, accelerated hypertension.   HISTORY OF PRESENT ILLNESS: The patient is a 81 year old white female with history of hypertension who earlier at noon started having shortness of breath, also started having headaches and felt like she was having difficulty with speech but was able to communicate with her daughter on the phone. She told her about these symptoms so they brought her to the ED. In the ER, the patient was noted to have elevated blood pressure initially 215/100. The patient had a similar type of presentation in March 2011. The patient reports that her blood pressure has been intermittently high, usually stays at 150. She was recently seen as an outpatient for possible sinus infection and at that time systolic was in the 160's. She otherwise denies any fevers or chills. No cough. No abdominal pain, nausea, vomiting, or diarrhea.   PAST MEDICAL HISTORY:  1. History of transient ischemic attacks in the past with negative work-up.  2. Asthma, on inhalers.  3. Hypothyroidism.  4. Osteoporosis.  5. Hiatal hernia with reflux.  6. Fibrocystic breast disease.  7. History of anemia.  8. History of migraine headaches.  9. Hypertension.  10. Status post hysterectomy   ALLERGIES: She has an allergy to aspirin, codeine, Ditropan, Flagyl, hydrochlorothiazide, Flagyl, propranolol, Tavist, and tramadol.   CURRENT MEDICATIONS:  1. Acetaminophen 500 1 tab p.o. daily p.r.n. for pain.  2. Amoxicillin 875 mg 1 tab p.o. b.i.d. for 10 days; start date 01/03, end date 07/26/2011. 3. Bystolic 20 10 b.i.d.  4. Calcium Plus Vitamin D 1 tab p.o. daily.  5. Clorazepate 3.75 1 tab p.o. t.i.d.  6. Flovent 2 puffs b.i.d. 7. Gelnique apply gel to affected areas.  8. Lisinopril 40 daily.   9. Maalox 1 tsp as needed.  10. Protonix 20 daily.  11. Plavix 75 p.o. daily.  12. Premarin 0.3 1 tab p.o. daily.  13. Synthroid 100 mcg 1 tab p.o. daily in the morning half hour prior to breakfast.  14. Vitamin B12 1 tab p.o. daily.  15. Vitamin D3 1000 IU b.i.d.   SOCIAL HISTORY: The patient is a nonsmoker. Lives by herself. No alcohol. She is independent.   FAMILY HISTORY: No history of any heart disease, diabetes, or hypertension.   REVIEW OF SYSTEMS: CONSTITUTIONAL: Denies any fevers or fatigue. No weakness. Headache which is now resolved. No weight loss. No weight gain. EYES: No blurred or double vision. No pain. No redness. No inflammation. No glaucoma. ENT: No tinnitus. No ear pain. Does have hearing aids for hearing loss, age related. No epistaxis. No difficulty with swallowing. RESPIRATORY: No cough. No wheezing. No hemoptysis. CARDIOVASCULAR: No chest pain. No orthopnea. No edema. No arrhythmia. No palpitations. No syncope. GI: No nausea, vomiting, diarrhea. No abdominal pain. No hematemesis. No melena. The patient does report difficulty with swallowing especially pills that get stuck in her throat. GU: Denies any dysuria, hematuria, renal calculus, frequency, or incontinence. ENDOCRINE: Denies any polyuria or nocturia. Does have history of hypothyroidism. Denies any heat or cold intolerance. HEME/LYMPH: Denies any anemia, easy bruisability or bleeding. SKIN: No acne. No rash. No change in mole, hair or skin. MUSCULOSKELETAL: Denies any pain in the neck, back or shoulders. NEUROLOGIC: No numbness. No weakness. No dysarthria. PSYCHIATRIC: No anxiety. No insomnia. No ADD.  PHYSICAL EXAMINATION:   VITAL SIGNS: Temperature 98.5, pulse 67, respirations 20, initial blood pressure on presentation 215/100, O2 99%. Currently last blood pressure was 188/81.   GENERAL: The patient is a 81 year old, appears younger than her stated age, in no acute distress.   HEENT: Head atraumatic,  normocephalic. Pupils equally round, reactive to light and accommodation. Extraocular movements intact. Oropharynx is clear without any exudates.   NECK: There is no thyromegaly. No carotid bruits.   CARDIOVASCULAR: Regular rate and rhythm. No murmurs, rubs, clicks, or gallops. PMI is not displaced.   LUNGS: Clear to auscultation bilaterally without any rales, rhonchi, or wheezing.   ABDOMEN: Soft, nontender, nondistended. Positive bowel sounds x4.   EXTREMITIES: No clubbing, cyanosis, or edema.   NEUROLOGIC: Awake, alert, oriented x3. No focal deficits.   SKIN: There is no rash.   LYMPHATICS: No lymph nodes palpable.   MUSCULOSKELETAL: There is no erythema or swelling.   VASCULAR: Good DP, PT pulses.   NEUROLOGICAL: Awake, alert, oriented x3. Cranial nerves II through XII grossly intact. No focal deficits.   LABORATORY, DIAGNOSTIC, AND RADIOLOGICAL DATA: Urinalysis nitrites negative, leukocytes negative. CT scan of the head shows no acute abnormality. WBC 7.9, hemoglobin 12, platelet count 275. BMP glucose 93, creatinine 0.51, sodium 127, potassium 4.3, chloride 89, CO2 29.   ASSESSMENT AND PLAN: The patient is a 81 year old white female who comes to the ED complaining of shortness of breath, headache with speech difficulties. Now her speech is normal.  1. Accelerated/malignant hypertension. At this time will continue Bystolic and lisinopril. Will give her clonidine b.i.d. and p.r.n. hydralazine. If the patient's blood pressure continues to be an issue, consider evaluation for renal artery stenosis with outpatient Doppler.  2. Speech difficulties which have now resolved likely related to her malignant hypertension. If recurs, consider getting carotid Doppler's, et Karie Soda. Continue Plavix. She can't take aspirin.  3. Asthma. Continue Flovent as doing at home.  4. Hypothyroidism. Continue thyroid supplements as doing previously.   5. Hyponatremia of unclear etiology. During previous  hospitalization, the patient had similar low sodium, not certain it it's chronic. At this time will follow. She was given IV fluids which caused her blood pressure to increase. I will hold off on any fluids for the time being. Will follow her BMP in the a.m.   TIME SPENT: 35 minutes spent on this H and P.  ____________________________ Lacie Scotts. Allena Katz, MD shp:drc D: 07/20/2011 22:46:22 ET T: 07/21/2011 07:53:35 ET JOB#: [id number]  cc: Isabel Freese H. Allena Katz, MD, <Dictator> John B. Danne Harbor, MD Charise Carwin MD ELECTRONICALLY SIGNED 07/28/2011 14:23

## 2014-11-04 NOTE — Consult Note (Signed)
PATIENT NAME:  Alyssa Landry, ATTRIDGE MR#:  [id number] DATE OF BIRTH:  Aug 24, 1918  DATE OF CONSULTATION:  08/11/2011  REFERRING PHYSICIAN:   CONSULTING PHYSICIAN:  Sandeep Radell K. Sherryll Burger, MD  REASON FOR CONSULTATION: Altered mental status, possible seizure.   HISTORY OF PRESENT ILLNESS: Alyssa Landry is a 81 year old Caucasian female who has a history of recurrent transient ischemic attack and multiple other medical problems who was recently admitted and then discharged to a facility. On Sunday, around 08/09/2011, the family felt like she was just not quite herself. She needed help eating, dressing, etc. For a period of time, her level of alertness went downhill and she was not talking to anybody. She was not able to complete sentences and would not keep her eyes open.   She was responding to very short sentences.   This afternoon, on 08/11/2011, the patient was noted to have sudden episode of shouting, calling for God, and asking him to take her away, etc.   The patient was never noted to have shaking, jerking, tongue biting, or loss of vision, urine, or bowel movement.   During this hospitalization, the patient was found to have some urinary tract infection. She does have some leukocytosis which is getting worse. For example, at admission, her WBC count was 13 but this morning it is 17.6.   The patient also received MRI of the brain which was unremarkable.   PAST MEDICAL HISTORY:  1. Positive for recent admission with chest pain and asthma. 2. Hypothyroidism. 3. Osteoporosis. 4. Hiatal hernia. 5. History of fibrocystic breast disease. 6. Migraine headaches.  7. Anemia.  8. Hypertension.  9. Chronic hyponatremia with possible SIADH.  10. History of ovarian mass of unclear etiology.  11. Anxiety disorder.   ALLERGIES: She is allergic to aspirin, codeine, Ditropan, Flagyl, HCTZ, propranolol, Tavist, Tramadol, and Vicodin.   SOCIAL HISTORY: She does not smoke and does not drink alcohol. She has  never had a DUI. She does not do recreational drugs. She was recently discharged to a rehab place. Before that she used to live by her himself.   FAMILY HISTORY: Negative for heart disease, diabetes, and hypertension. Most of her immediate family members are deceased.   REVIEW OF SYSTEMS: Not obtainable as the patient gives very few responses.   PHYSICAL EXAMINATION:   VITAL SIGNS: Temperature 97.8, pulse 82, pulse oximetry 98%, respiratory 22, blood pressure 158/69.   GENERAL: She is an elderly-looking, thin, cachectic Caucasian female lying in bed, not in acute distress, surrounded by more than 7 to 8 family members.   She has severe arthritic changes in her fingers. She is arousable with physical and verbal stimulation, but falls back to sleep very easily.   She gives one word answers. She was able to tell me the daughter's name. She showed me two fingers on her right hand. She was able to move the left lower extremity voluntarily, but then she started saying no to most of the questions such as further questions for orientation and whether her skin can feel me touching or not.   She did not resist any movement of her arms or legs.   Further assessment of higher cortical function was difficult due to her poor responsiveness and cooperation.   A family member mentioned that she just gets tired easily and does not want to answer any questions.   CRANIAL NERVES: Her pupils are equal, round, and reactive. Her extraocular movements were difficult to check and her visual fields seemed symmetric. Her face  was symmetric. Tongue was midline. Her hearing is not reliably tested.   On her motor exam, her tone was normal. She seemed to move all her extremities.  On sensory examination, she declined she felt touch anywhere.  Her reflexes were not checked. Her gait was not checked.   LABS/STUDIES: On MRI of the brain, she has extensive white matter microvascular ischemic changes, but I did not see  any new ischemic infarct.   I also reviewed her MRI from 2008, 2011, and 2012.   ASSESSMENT AND PLAN:  Acute on chronic encephalopathy. At baseline she does have some cognitive decline, but for her age she is doing good. She does need help with food and taking care of her finances. She had very limited driving at baseline before all her problems started.   She does not have known psychiatric history.   Based on the family's description of events, I have very low suspicion that this spell in the morning represented a seizure episode.   I cannot completely rule out postictal phase, but she did not have any other signs of postictal Todd's, etc.   I will not continue Dilantin on her for now.   If the patient continues to have fluctuation of her symptoms, we can consider doing an EEG in the future, but might be low yield.   Avoid giving her Phenergan, which is on her current orders, as a p.r.n.   The most likely cause of her encephalopathy is toxic metabolic (possible septic encephalopathy with her worsening leukocytosis and possible urinary tract infection and possible pneumonia).   I had an extensive discussion with the family regarding in patients with poor cognitive reserve can have delirium with the changes in other systemic issues.   The family seemed to agree. They prefer not going for any other further invasive diagnostic tests such lumbar puncture, which is understandable.   So far her labs look okay.  I talked to the family about the importance of providing frequent orientation and orientation about day, date, and month, and having a well-lit room during the daytime, keep the lights off at nighttime, avoid any unnecessary interruption to her sleep, and keep the TV off if she is not watching, especially at nighttime.   Look for any stool impaction, etc.   It was my pleasure to see this patient. Feel free to contact me with any further questions. I will try to call Dr. Dan HumphreysWalker to  update him. I will see her on a p.r.n. basis.  ____________________________ Durene CalHemang K. Sherryll BurgerShah, MD hks:slb D: 08/11/2011 18:08:54 ET T: 08/12/2011 07:18:47 ET JOB#: [id number]  cc: Gracelynn Bircher K. Sherryll BurgerShah, MD, <Dictator> Durene CalHEMANG K [facility] MD ELECTRONICALLY SIGNED 08/26/2011 12:25

## 2014-11-04 NOTE — Consult Note (Signed)
Past Medical/Surgical Hx:  GERD - Esophageal Reflux:   anxiety:   hypothyroidism:   hiatal hernia:   hypertension:   atshma:   Partial Hysterectomy:   Home Medications: Medication Instructions Last Modified Date/Time  predniSONE 10 mg oral tablet 1 tab(s) orally x 1 dose at 9am for pneumonia, bacterial. **start date 08/11/11 end date 08/11/11** 28-Jan-13 12:37  predniSONE 10 mg oral tablet 2 tab(s) orally x 1 dose at 9am for pneumonia, bacterial.  28-Jan-13 12:37  amlodipine 10 mg oral tablet 1 tab(s) orally once a day at 9am for benign essential hypertension. 28-Jan-13 12:37  nitroglycerin 0.4 mg/hr transdermal film, extended release 1 patch transdermal once a day at 9am for chest pain symptom. **remove old patch 28-Jan-13 12:37  clopidogrel 75 mg oral tablet 1 tab(s) orally once a day at 9am for cerebrovascular disease le. **brand name plavix** 28-Jan-13 12:37  tiotropium 18 mcg inhalation capsule Inhale 1 cap(s) via handihaler once a day at 9am for chronic obst asthma.  **brand name spiriva** 28-Jan-13 12:37  Vitamin B-12 1000 mcg oral tablet 1 tab(s) orally once a day at 9am for vitamin b deficiency. 28-Jan-13 12:37  cholecalciferol 1000 intl units oral tablet 1 tab(s) orally once a day at 9am for vitamin deficiency.  **brand name vitamin d3** 28-Jan-13 12:37  fluticasone CFC free 110 mcg/inh inhalation aerosol 2 puff(s) inhaled 2 times a day (9am, 6pm) for chronic obst asthma.  **brand name flovent** 28-Jan-13 12:37  clonidine 0.2 mg oral tablet 1 tab(s) orally every 8 hours (6am, 2pm, 10pm) for benign hypertension. 28-Jan-13 12:37  levothyroxine 100 mcg (0.1 mg) oral tablet 1 tab(s) orally once a day at 6:30am for hypothyroidism. **brand name levoxyl** 28-Jan-13 12:37  pantoprazole 20 mg oral delayed release tablet 1 tab(s) orally once a day at 6:30am for esophageal reflux. *do not crush*  **brand name protonix** 28-Jan-13 12:37  nitroglycerin 0.4 mg sublingual tablet 1 tab(s) sublingual  every 5 minutes for chest pain x 3 doses as needed. if no relief call 911 28-Jan-13 12:37  clorazepate 3.75 mg oral tablet 1 tab(s) orally 3 times a day as needed for anxiety. **brand name tranxene t-tab** 28-Jan-13 12:37  acetaminophen 325 mg oral tablet 2 tabs (620m) orally every 4 hours as needed for pain/fever. **brand name tylenol** 28-Jan-13 12:37   Allergies:  Codeine: Unknown  Propranolol: Unknown  Flagyl: Unknown  Metronidazole: Unknown  Ditropan: Unknown  Aspirin: Unknown  Tavist: Unknown  Hydrochlorothiazide: Unknown  Tramadol HCl: Unknown  Vicodin: Unknown  Vital Signs: **Vital Signs.:   29-Jan-13 14:52   Vital Signs Type Routine   Temperature Temperature (F) 97.4   Celsius 36.3   Temperature Source axillary   Pulse Pulse 89   Pulse source per Dinamap   Respirations Respirations 22   Systolic BP Systolic BP 1161  Diastolic BP (mmHg) Diastolic BP (mmHg) 89   Mean BP 113   BP Source Dinamap   Pulse Ox % Pulse Ox % 95   Pulse Ox Activity Level  At rest   Oxygen Delivery Room Air/ 21 %   Lab Results:  Thyroid:  28-Jan-13 06:19    Thyroid Stimulating Hormone 3.85  Hepatic:  29-Jan-13 05:01    Bilirubin, Total 0.8   Alkaline Phosphatase 78   SGPT (ALT) 23   SGOT (AST) 17   Total Protein, Serum 6.6   Albumin, Serum   2.8  Routine Chem:  28-Jan-13 06:19    Cholesterol, Serum   220   Triglycerides, Serum 73  HDL (INHOUSE)   106   VLDL Cholesterol Calculated 15   LDL Cholesterol Calculated 99  29-Jan-13 05:01    Glucose, Serum 77   BUN 15   Creatinine (comp)   0.49   Sodium, Serum   127   Potassium, Serum 3.8   Chloride, Serum   90   CO2, Serum 26   Calcium (Total), Serum   8.2   Osmolality (calc) 255   eGFR (African American) >60   eGFR (Non-African American) >60   Anion Gap 11  Cardiac:  28-Jan-13 22:04    Troponin I < 0.02   CK, Total 22   CPK-MB, Serum 0.6  Routine UA:  28-Jan-13 09:04    Color (UA) Yellow   Clarity (UA) Cloudy    Glucose (UA) Negative   Bilirubin (UA) Negative   Ketones (UA) Trace   Specific Gravity (UA) 1.006   Blood (UA) Negative   pH (UA) 8.0   Protein (UA) Negative   Nitrite (UA) Negative   Leukocyte Esterase (UA) Trace   RBC (UA) 20 /HPF   WBC (UA) 9 /HPF   Bacteria (UA) TRACE   Amorphous Crystal (UA) PRESENT  Routine Hem:  29-Jan-13 05:01    WBC (CBC)   17.6   RBC (CBC) 4.36   Hemoglobin (CBC) 13.1   Hematocrit (CBC) 39.7   Platelet Count (CBC) 315   MCV 91   MCH 30.1   MCHC 33.1   RDW 13.8   Neutrophil % 86.8   Lymphocyte % 7.3   Monocyte % 5.7   Eosinophil % 0.1   Basophil % 0.1   Neutrophil #   15.3   Lymphocyte # 1.3   Monocyte #   1.0   Eosinophil # 0.0   Basophil # 0.0   Radiology Results: [place]:    28-Jan-13 11:52, US Carotid Doppler Bilateral   US Carotid Doppler Bilateral    REASON FOR EXAM:    suspected stroke in L hemisphere  COMMENTS:       PROCEDURE: [place]  - US CAROTID DOPPLER BILATERAL  - Aug 10 2011 11:52AM     RESULT: Carotid Doppler interrogation is compared to the previous study   of October 07, 2009.    Antegrade flow is seen in both carotids and both vertebral arteries. The   peak systolic velocities, color Doppler and SPECTRAL Doppler appearance   is normal. The internal to common carotid peak systolic velocity ratio is   1.18 on the right and 0.78 on the left. Minimal atherosclerotic   noncalcified and calcified plaque is seen in the carotid bifurcation   regions bilaterally.    IMPRESSION:   1. Minimal atherosclerotic disease.   2. No evidence of hemodynamically significant stenosis.    Thank you for the opportunity to contribute to the care of your patient.           Verified By: Sundra Aland, M.D., MD  MRI:    28-Jan-13 13:51, MRI Brain Without Contrast   MRI Brain Without Contrast    REASON FOR EXAM:    [place]  COMMENTS:       PROCEDURE: MR  - MR BRAIN WO CONTRAST  - Aug 10 2011  1:51PM     RESULT: Comparison: MRI the  brain 04/11/2011. CT of the brain 08/10/2011    Technique: Standard brain protocol, without intravenous contrast.    Findings:  Periventricular and subcortical T2 hyperintensity is similar to prior,   and consistent with sequela of  chronic microangiopathy. The images are   slightly degraded by patient motion artifact. There are no areas of   restricted diffusion to suggest acute infarct. No evidence of mass, mass   effect, midline shift, or extra-axial fluid collection. The intracranial     flow voids are unremarkable.    The visualized paranasal sinuses are well aerated.    IMPRESSION:     1. No acute infarct.  2. Chronic microangiopathy.          Verified By: Gregor Hams, M.D., MD  CT:    28-Jan-13 06:29, CT Head Without Contrast   CT Head Without Contrast    REASON FOR EXAM:    aloc  COMMENTS:   May transport without cardiac monitor    PROCEDURE: CT  - CT HEAD WITHOUT CONTRAST  - Aug 10 2011  6:29AM     RESULT:     Comparison:  Comparison is made to a prior study dated 07/20/2011.    Technique: Helical noncontrasted 5 mm sections were obtained from the   skull base through the vertex.    Findings: There is mild diffuse cortical atrophy and mild areas of low   attenuation within the subcortical, deep, and periventricular white   matter regions. There isno evidence of intra-axial or extra-axial fluid     collections or evidence of acute hemorrhage. The ventricles and cisterns   are patent. There is no evidence of mass effect or a depressed skull   fracture.    IMPRESSION:      1. Involutional changes without evidence of focal or acute abnormalities.  2. Dr. Renee Ramus of the Emergency Department was informed of these findings   via a preliminary faxed report.    Thank you for the opportunity to contribute to the care of your patient.           Verified By: Mikki Santee, M.D., MD   Impression/Recommendations:  Recommendations:   Please see my dictation  for details.81 yo WF with worsening leucocytosis, possible UTI came due to lethargy, decreased ability to do things, hand an episode of shouting out loud for god's help etc.to represent seizure based on family's description of events. Non focal exam.toxic-metabolic encephalopathy (septic encephalopthy). Neg MRI brain, not indicated.pt continues to have flactuation - we can consider EEG in future but low yield. Don't continue dilantin. for the opportunity to participate in care of your patient. Feel free to contact me with any further questions on this pt, I will follow this patient on PRN basis.   Electronic Signatures: Ray Church (MD)  (Signed 29-Jan-13 18:09)  Authored: PAST MEDICAL/SURGICAL HISTORY, HOME MEDICATIONS, ALLERGIES, NURSING VITAL SIGNS, LAB RESULTS, RADIOLOGY RESULTS, Recommendations   Last Updated: 29-Jan-13 18:09 by Ray Church (MD)

## 2014-11-04 NOTE — H&P (Signed)
PATIENT NAME:  Alyssa Landry, LANGILL MR#:  [id number] DATE OF BIRTH:  1918/12/03  DATE OF ADMISSION:  07/29/2011  ER REFERRING PHYSICIAN:   Maurilio Lovely, MD   PRIMARY CARE PHYSICIAN: John B. Dan Humphreys, III, MD   CHIEF COMPLAINT: Chest tightness, shortness of breath.   HISTORY OF PRESENT ILLNESS: The patient is a 81 year old female with past medical history of transient ischemic attack. She also reports that during her recent admission to [facility] from 07/20/2011 to 07/22/2011 she had small stroke, asthma, hypothyroidism, osteoporosis,  reports that she was well until this afternoon when she suddenly developed chest tightness described as pressure-like and difficulty breathing. She reports that she could not take a deep breath. She reports some cough, denies any fever, denies any expectoration, nausea, vomiting, diarrhea, constipation, lightheadedness or dizziness. The patient was recently admitted to [facility] from 07/20/2011 to 07/22/2011 for substernal pressure, headache and accelerated hypertension. She was also found to be hyponatremic. The patient reports that she has chronic on and off hyponatremia which resolves on its own without any treatment. During her last admission, she was given some fluids and that caused her blood pressure to further increase, therefore, her fluids were stopped. The patient denies any wheezing. On chest x-ray, she was found to have slightly increased pleural effusion. During her last admission she had mildly elevated troponin which was found to be due to stress demand ischemia resulting from accelerated hypertension.   ALLERGIES: Aspirin, codeine, Ditropan,  Flagyl, HCTZ, propranolol, Tavist, tramadol, Vicodin.   CURRENT MEDICATIONS:  1. Tylenol p.r.n.  2. Bystolic 10 mg b.i.d.  3. Clonidine 0.2 mg t.i.d.   4. Clorazepate 3.75 mg t.i.d.  5. Flovent 110 mcg, 2 puffs b.i.d.  6. Gelnique, apply to affected area once a day.  7. Lisinopril 5 mg daily.  8. Maalox p.r.n.   9. Protonix 20 mg daily.  10. Plavix 75 mg daily p.r.n.  11. Premarin 0.3 mg daily.  12. Synthroid 100 mcg daily.  13. Vitamin B12 daily.  14. Vitamin D3, 1000 international units b.i.d.   PAST MEDICAL HISTORY:  1. History of transient ischemic.  2. Asthma.  3. Hypothyroidism.  4. Osteoporosis.  5. Hiatal hernia with gastroesophageal reflux disease. 6. Fibrocystic breast disease. 7. History of anemia.  8. Migraine headaches.  9. Hypertension.  10. History of chronic hyponatremia.  11. Recent admission for accelerated hypertension. The patient reported that she had a stroke at that time; however, I do not see if the patient had an MRI of her brain that time. The last MRI of the patient was in September 2012 which showed no acute intracranial abnormality.   SOCIAL HISTORY: The patient denies any history of smoking, alcohol or drug abuse. She lives alone. Since her last hospitalization, she has been ambulating with a walker.   FAMILY HISTORY: Denies any history of heart disease, diabetes or hypertension.   REVIEW OF SYSTEMS: CONSTITUTIONAL: Denies any fever or fatigue. Reports weakness and headache. EYES: Denies any blurred or double vision. ENT: Denies any tinnitus, ear pain. Wears hearing aids. RESPIRATORY: Reports some cough. Denies any wheezing or painful respiration. CARDIOVASCULAR: Reports some chest tightness. Denies any orthopnea, edema, or palpitations. GI: Denies any nausea, vomiting, diarrhea, or abdominal pain. GU: Denies any hematuria, dysuria. ENDOCRINE: Denies any polyuria, nocturia. Has hypothyroidism. HEME/LYMPH: Denies any anemia or easy bruisability. SKIN: Denies any history of acne or rash. NEUROLOGICAL: Denies any numbness or weakness. PSYCHIATRIC: Denies any history of anxiety, depression.     PHYSICAL EXAMINATION:  VITAL SIGNS: Temperature 97.7, heart rate 52, respiratory rate 20, blood pressure 218/102, pulse oximetry 94% on room air.   GENERAL: The patient is an  elderly Caucasian female sitting comfortably in bed. She appears mildly distressed.   HEENT: Head: Atraumatic, normocephalic. Eyes: No pallor, icterus, or cyanosis. Pupils equal, round, and reactive to light and accommodation. Extraocular movements intact. ENT: Dry mucous membranes. No oropharyngeal erythema or thrush.   NECK: Supple. No masses. No JVD. No thyromegaly or lymphadenopathy.   CHEST WALL: No tenderness to palpation. Not using accessory muscles of respiration. No intercostal retractions.   LUNGS: The patient has bilateral basal crepitations. No wheezing, rhonchi.   CARDIOVASCULAR: S1, S2 regular.  There is a systolic murmur. No rubs or gallops.  ABDOMEN: Soft, nontender, nondistended. No guarding, no rigidity, no organomegaly. Normal bowel sounds.   SKIN: No rashes or lesions.   PERIPHERIES: No pedal edema, 2+ pedal pulses.   MUSCULOSKELETAL: No cyanosis or clubbing.   NEUROLOGICAL: Awake, alert, oriented x3. Nonfocal neurological exam. Cranial nerves are grossly intact.   PSYCHIATRIC: Appears to be mildly anxious.   LABORATORY, DIAGNOSTIC AND RADIOLOGICAL DATA:  EKG shows bradycardia with heart rate of about 52. There is no acute ST-T wave change. Cardiac enzymes are negative so far.  Glucose 104, BUN 8, creatinine 0.50, sodium 124, potassium 4.8, chloride 8.83, CO2 31, calcium 8.1, AST 59. The rest of hepatic panel is normal.  White count 6.7, hemoglobin 10.3, hematocrit 31.0, platelet count 247.  Chest x-ray showed increasing left pleural effusion. Underlying infection or atelectasis is suggested. Changes of chronic fibrosis versus edema right lung base. Minimal infiltrate versus atelectasis versus fibrosis.   ASSESSMENT AND PLAN: A 81 year old female with history of hypertension, recently discharged from the hospital, presents with complaints of chest pain, chest tightness, and shortness of breath.   1. Chest pain: Could be possibly due to accelerated hypertension.  During the patient's last hospitalization, she did have minimally elevated troponin. The highest was 0.16, and this was felt to be due to demand ischemia from accelerated hypertension. The primary physician saw the patient and did not order any additional work-up. We will admit the patient into the hospital and check serial cardiac enzymes. We will continue her current medications, including beta blocker and ACE inhibitor. She is allergic to aspirin. We will continue her Plavix. We will defer further management and testing to the patient's primary care physician.  2. Accelerated hypertension: The patient's most recent blood pressure was 218/102.  Currently her blood pressure is better. We will continue the patient's Bystolic, clonidine and lisinopril. We will place on p.r.n. hydralazine. We will adjust medications as needed to achieve good hypertensive control.  3. Hyperglycemia, possibly reactive: The patient has a history of diabetes.  4. Hyponatremia: The patient's sodium level is 124. During the last hospitalization, her sodium level was 127. As per the patient, she has chronic hyponatremia and has had work-up which has been negative. With just conservative management, her sodium has normalized since the last admission. When she was given fluids, it caused her blood pressure to increase; therefore, I will not give her any IV fluids for the time being. The patient's mental status appears to be at her baseline.   5. Anemia, likely of chronic disease: Stable.  6. Left pleural effusion: The patient's chest x-ray shows left pleural effusion and changes of atelectasis versus fibrosis versus infection. However, clinically the patient does not appear to have any signs of infection. She is afebrile with  a normal white count, therefore, we will manage conservatively.  7. History of asthma: We will continue the patient's Flovent and also place on albuterol. Currently there is no evidence of wheezing.   8. Hypothyroidism: We will continue the patient's Synthroid.  9. History of gastroesophageal reflux disease with hiatal hernia: Continue PPI.  10. History of transient ischemic attack/questionable recent stroke: We will continue the patient on her Plavix.  I reviewed old medical records, discussed with the ER physician, discussed with the patient and her daughter the plan of care and management.   TIME SPENT:   75 minutes.   ____________________________ Darrick Meigs, MD sp:cbb D: 07/29/2011 17:31:48 ET T: 07/29/2011 18:28:16 ET JOB#: [id number]  cc: Darrick Meigs, MD, <Dictator> John B. Danne Harbor, MD Darrick Meigs MD ELECTRONICALLY SIGNED 07/29/2011 19:21

## 2014-11-04 NOTE — Discharge Summary (Signed)
PATIENT NAME:  Alyssa Landry, Alyssa Landry MR#:  [id number] DATE OF BIRTH:  1918/11/12  DATE OF ADMISSION:  07/29/2011 DATE OF DISCHARGE:  08/07/2011  HISTORY OF PRESENT ILLNESS: Alyssa Landry is a 81 year old white lady with a history of difficult to control hypertension who presented to the Emergency Room complaining of chest pain and difficulty breathing. The patient had recently been hospitalized with hypertensive crisis with transient neurological deficits. At that time she had been discharged on clonidine which had controlled her blood pressure while in the hospital but at home she started having wide variations between high and low blood pressures. In the Emergency Room, the patient was noted to have a small pleural effusion on chest x-ray. She was also complaining of substernal chest pain and, therefore, was admitted for further evaluation.   ALLERGIES: The patient was noted to be intolerant to aspirin, codeine, Ditropan, Flagyl, hydrochlorothiazide, propranolol, Tavist, tramadol, and Vicodin.   MEDICATIONS ON ADMISSION:  1. Bystolic 10 mg b.i.d.  2. Clonidine 0.2 mg t.i.d.  3. Tranxene 3.75 mg t.i.d. p.r.n.  4. Flovent 110 mcg 2 puffs b.i.d.  5. Lisinopril 5 mg daily.  6. Protonix 40 mg daily.  7. Plavix 75 mg daily.  8. Premarin 0.3 mg daily.  9. Synthroid 100 mcg daily.  10. Vitamin B12 1000 mcg daily.  11. Vitamin D3 1000 units b.i.d.   PAST MEDICAL HISTORY:  1. Cerebrovascular disease with previous transient ischemic attacks.  2. Asthma.  3. Hypothyroidism.  4. Osteoporosis. 5. Hypertension. 6. Chronic hyponatremia.   ADMISSION PHYSICAL EXAMINATION: Temperature 97.7, heart rate 52, respiratory rate 20, blood pressure 218/102. Pulse oximetry was 94% on room air. Exam as described by the admitting physician revealed crackles in both lung bases but no wheezing, rhonchi, or rales. She was also noted to have a systolic murmur that is not well described in the admission note. The remainder of  the exam was essentially unremarkable.   LABORATORY, DIAGNOSTIC, AND RADIOLOGICAL DATA: The patient's admission CBC showed a hemoglobin of 10.3 with a hematocrit of 31. White count was 6700. Platelet count was 247,000. Admission comprehensive metabolic panel showed a BUN of 8 with a creatinine of 0.5, sodium 124, chloride 83, calcium 8.1. Albumin was 2.9. Magnesium was 1.5. TSH was 6.21. Admission EKG showed a sinus bradycardia with marked sinus arrhythmia. There was evidence of an old anterior septal infarct. Admission chest x-ray showed a left pleural effusion. There was evidence of pulmonary fibrosis. There was possible atelectasis versus fibrosis in the right lung base.   HOSPITAL COURSE: The patient was admitted to the regular medical floor where she was placed on telemetry. Again, serial cardiac enzymes were obtained all of which were normal. Cardiology was consulted since on her previous admission she had had one troponin that was slightly elevated. Their opinion was, however, that her chest discomfort was noncardiac. The patient was also seen in consultation by Nephrology for control of her blood pressure and also her electrolyte imbalance. Her beta-blocker eventually had to be discontinued due to bradycardia. She was placed on hydralazine which also eventually had to be discontinued due to severe flushing. She was eventually placed back on Norvasc which she had taken in the past and at the time of transfer her blood pressure control is still borderline. The patient was evaluated for her hyponatremia and was felt to have inappropriate ADH. However, a search for a possible underlying malignancy was initiated by Nephrology. She did have a chest CT that showed multiple mediastinal lymph nodes.  There were also multiple tiny lucencies noted throughout the spine. There were also interstitial changes that was read out by the radiologist as heart failure but it was felt by the consultant to myself to represent  the patient's underlying pulmonary fibrosis as she had no clinical evidence of heart failure. The patient did have a serum protein immunoelectrophoresis that was negative. She eventually also had a CT scan of the abdomen and pelvis. CT scan of the pelvis was notable for a 5 x 4 cm hypodense fluid attenuating left adnexal mass. Follow-up pelvic ultrasound showed a cystic focus in the left adnexal region. It had a thick rim and they cannot decide whether it was benign or malignant. The patient, however, did have an elevated CA-125. She was seen in consultation by Oncology who felt that she would be a poor candidate for treatment and did not recommend pursuing a tissue diagnosis. They do plan to see her as an outpatient following discharge. The patient was placed on fluid restriction during her hospitalization because of her hyponatremia. At the time of discharge, it was ranging between 128 and 130. The patient was also started on a pulmonary toilet for her chronic hypoxia that again was felt to be due to her underlying pulmonary fibrosis.   DISCHARGE DIAGNOSES:  1. Malignant hypertension.  2. Hyponatremia most likely secondary to inappropriate ADH.  3. Possible left adnexal mass.  4. Pulmonary fibrosis.  5. Hypothyroidism  6. History of asthma.   DISCHARGE MEDICATIONS:  1. Nitroglycerin 0.4 mg sublingually p.r.n.  2. Clonidine 0.2 mg q.8 hours.  3. Tranxene 3.75 mg t.i.d. p.r.n.  4. Flovent 110 mcg 2 puffs b.i.d.  5. Protonix 20 mg daily.  6. Plavix 75 mg daily.  7. Levoxyl 0.1 mg daily.  8. Vitamin B12 1000 mcg daily.  9. Vitamin D3 1000 units daily.  10. Tylenol 650 mg q.4 hours p.r.n.  11. Nitroglycerin patch 0.4 mg topically daily.  12. Spiriva 1 puff daily.  13. Prednisone 40 mg to be tapered by 10 mg daily.  14. Amlodipine 10 mg daily.   DISCHARGE INSTRUCTIONS:  1. The patient is being discharged on diet as tolerated.  2. She is to continue on fluid restriction to 1 liter a day.   3. It is suggested that her basic metabolic panel be repeated in approximately one week.  4. She was seen in consultation by physical therapy while she was in the hospital which will be continued in skilled nursing.  5. The patient was also seen by Palliative Care and is now a NO CODE.      6. The patient has a tentative appointment to be seen by Oncology on February 12th for follow-up on her possible left adnexal mass.   ____________________________ Letta PateJohn B. Danne HarborWalker III, MD [id number] D: 08/07/2011 07:43:00 ET T: 08/07/2011 08:07:20 ET JOB#: [id number]  cc: Letta PateJohn B. Danne HarborWalker III, MD, <Dictator> Elmo PuttJOHN B WALKER III MD ELECTRONICALLY SIGNED 08/10/2011 21:39

## 2014-11-04 NOTE — Consult Note (Signed)
    General Aspect 81 yo female with history of hypertension who was admitted with chest discomfort, hypertension and shortness of breath with chest pain. She has no signficant troponin elevation. She complains of chest tightness at rest. She has wheezing and a dry cough. Her blood pressure has been difficult to control. She states she is compliant with her meds at home.   Physical Exam:   GEN thin    HEENT PERRL    NECK supple    RESP wheezing  rhonchi    CARD Regular rate and rhythm  Murmur    ABD denies tenderness  normal BS    LYMPH negative neck    EXTR negative cyanosis/clubbing, negative edema    SKIN normal to palpation    NEURO cranial nerves intact, motor/sensory function intact    PSYCH A+O to time, place, person   Review of Systems:   Subjective/Chief Complaint chest tightness and shortness of breath    Skin: No Complaints    ENT: No Complaints    Eyes: No Complaints    Neck: No Complaints    Respiratory: Frequent cough  Short of breath  Wheezing    Cardiovascular: Tightness    Gastrointestinal: No Complaints    Genitourinary: No Complaints    Vascular: No Complaints    Musculoskeletal: No Complaints    Neurologic: No Complaints    Hematologic: No Complaints    Endocrine: No Complaints    Psychiatric: No Complaints    Review of Systems: All other systems were reviewed and found to be negative    Medications/Allergies Reviewed Medications/Allergies reviewed     GERD - Esophageal Reflux:    anxiety:    hypothyroidism:    hiatal hernia:    hypertension:    atshma:    Partial Hysterectomy:   EKG:   EKG NSR    Codeine: Unknown  Propranolol: Unknown  Flagyl: Unknown  Metronidazole: Unknown  Ditropan: Unknown  Aspirin: Unknown  Tavist: Unknown  Hydrochlorothiazide: Unknown  Tramadol HCl: Unknown  Vicodin: Unknown    Impression 81 yo female with history of hypertension who was admitted with accelerated hypertension,  shortness of breath, chest tightness. SHe has ruled out for an mi with a normal serum tropoonin. Her blood pressure has been difficult to control. She complains of chest tightness without radiation. She denies syncope or presyncope. She is being worked up for renovascular hypertension.    Plan 1. Continue with aggressive treatemnt of hypertension. Have added topical nitrates to help control pressure 2. Continue iwth workup of renovascular hypertension, pheo etc. 3. Does not appear to require invasive cardiac evaluaiton at present.   Electronic Signatures: Dalia HeadingFath, Kenneth A (MD)  (Signed 17-Jan-13 19:26)  Authored: General Aspect/Present Illness, History and Physical Exam, Review of System, Past Medical History, EKG , Allergies, Impression/Plan   Last Updated: 17-Jan-13 19:26 by Dalia HeadingFath, Kenneth A (MD)

## 2014-11-04 NOTE — Consult Note (Signed)
PATIENT NAME:  Allen DerryWILLIAMS, Donella C MR#:  [id number] DATE OF BIRTH:  1918-10-20  DATE OF CONSULTATION:  08/11/2011  REFERRING PHYSICIAN:   Ned GraceNancy Phifer, MD CONSULTING PHYSICIAN:  Adelene AmasJames S. Jameela Michna, MD  REASON FOR CONSULTATION: Acute mental status changes with hallucinations.    HISTORY OF PRESENT ILLNESS: Ms. Heidi Dachnnie Lang is a 81 year old widowed female admitted [facility] on the 28th of January 2013 due to acute mental status change.    [name] has a number of general medical problems. She recently was undergoing rehabilitation for a CVA two days prior to admission. She was in her normal mental status. She then became progressively confused and was not able to feed herself, and she was brought into the hospital. This morning she began calling out to God over and over asking God not to leave her in a distressed disposition. She then went into a position of resisting movement without cooperating with the staff. This afternoon, the staff was able to provide bedside care. The patient is not combative. She will only provide very short phrases when asked questions. She has severe poverty of speech. She remains in a partially reclined supine position with her hands folded.   The undersigned discussed the behavior and verbal activities of the patient this morning with her daughter, Ms. [place]. Ms. Marcelle OverlieHolland stated that the repeated verbal exclamations were consistent with the patient's religion. She did not know of any expression that was not consistent with the patient's religion, and she is open to treating any thoughts or hallucinations that are distressing as well as any disposition that would undermine critical care of the patient.   Regarding memory and orientation specifically, the patient will not provide answers.   PAST PSYCHIATRIC HISTORY: [name] does have a long-term history of feeling on edge, muscle tension and excessive worry which have been treated with  Tranxene 3.75 mg per day as needed. That was the most recent dose upon admission. If there have been other dosages they are not known.  [name] has no history of major depression, elevated mood, or prior hallucinations. She has no history of psychiatric care or suicide attempts.   FAMILY PSYCHIATRIC HISTORY: None known.   SOCIAL HISTORY: [name] is widowed. She has no history of smoking, using alcohol or using illegal drugs. Most recently she was living alone and was ambulating with a walker. Occupation: Retired. Marital status: Widowed. She has close family support.   PAST MEDICAL HISTORY:  1. Cerebrovascular disease with history of cerebrovascular accidents.  2. Osteoporosis. 3. Hypothyroidism.  4. Asthma.  5. Fibrocystic breast disease. 6. Hiatal hernia.  7. Gastroesophageal reflux.  8. Anemia. 9. Migraine headaches. 10. Hypertension. 11. Hyponatremia secondary to SIADH.  12. Ovarian mass.   ALLERGIES: Aspirin, codeine, Ditropan, Flagyl, hydrochlorothiazide, metronidazole, propranolol, Tavist, tramadol, and Vicodin.   MEDICATIONS: The MAR is reviewed. There are no psychotropic medications.   LABORATORY, DIAGNOSTIC AND RADIOLOGICAL DATA:  Complete metabolic profile shows decreased albumin at 2.8, hyponatremia at 127. BUN and creatinine are unremarkable.  CBC shows WBC at 17.6.  CK total was 22. MRI of the brain without contrast yesterday showed no acute infarct with chronic microangiopathy.  Urinalysis showed 9 per high-power field of WBCs. No epithelial cells. The growth showed coagulase-negative Staphylococcus. The patient is on Levaquin.  EKG: QTc 415 ms. TSH normal.  REVIEW OF SYSTEMS: Constitutional, HEENT, mouth, neurologic, psychiatric, cardiovascular, respiratory, gastrointestinal, genitourinary, skin, musculoskeletal, hematologic, lymphatic, endocrine, metabolic are all unremarkable.  PHYSICAL EXAMINATION:  VITAL SIGNS: Temperature 97.4, pulse 89,  respiratory rate 22, blood pressure 163/89.   MENTAL STATUS EXAM: A slightly cachectic elderly female lying in a partially reclined supine position in her hospital bed with no abnormal involuntary movements. She does allow a soft grasping of her right hand and examination of range of motion at the right elbow. There is no rigidity. Her grooming and hygiene are normal. Specific testing on orientation, memory, detailed thought process, fund of knowledge, abstraction, ability, use of language, intelligence, and thought content are not permitted by the patient. She does appear to be fairly alert. She does provide short answers, occasionally in the form of phrases, without dysarthria. They are appropriate. Please see the history of present illness. The undersigned asked about her religious faith, and she stated "I am Baptist." When the undersigned stated that he appreciated her answers and that she could contact the nursing station if she was motivated to provide any additional information, the patient stated, "It was nice talking to you, also." Her judgment and insight are grossly impaired. Her affect is flat with eyes closed. Her head remains in the same position symmetrically positioned forward anterior. She does appear to be able to concentrate on what is said around her. She does not divulge any hallucinations at the time of the interview.   ASSESSMENT:  AXIS I:  1. Delirium due to multiple general medical problems.  2. Psychotic disorder, not otherwise specified, with hallucinations.  3. Generalized anxiety disorder, provisional. This disorder is in the background given her current organic condition.   AXIS II: None.   AXIS III: Cerebrovascular disease. Please see the past medical history.   AXIS IV: General medical.   AXIS V: 20.   The indications, alternatives and adverse effects of Zyprexa and Ativan were discussed with the patient's daughter, Ms. [place]. She understands and wants to proceed  with Ativan for anti- acute severe anxiety/agitation or Zyprexa for antipsychosis. She only approves of using the Zyprexa if the hallucinations are of an ego dystonic nature, not if they are of the patient's religion and pleasant. The undersigned discussed the case with Dr. Harvie Junior.    RECOMMENDATIONS:  1. If the conditions of psychosis emerge as above, I would start Zyprexa 2.5 mg, Zydis p.o. or 2.5 mg IM at 17:00.  2. For severe anxiety or agitation, I would utilize Ativan 0.5 to 1 mg p.o. or IM every 6 hours p.r.n.  3. The following tests would be unlikely needed; however, if the patient persists with cognitive abnormalities over the long run, could check B12 and folic acid.  4. I would continue to provide a low-stimulation environment and reinforcement of orientation.  ____________________________ Adelene Amas. Gustavus Haskin, MD [id number]:[id number] D: 08/11/2011 16:37:05 ET T: 08/11/2011 17:21:36 ET JOB#: [id number]  cc: Adelene Amas. Adelyna Brockman, MD, <Dictator> Lester Amherst MD ELECTRONICALLY SIGNED 08/14/2011 11:14

## 2014-11-04 NOTE — Consult Note (Signed)
History of Present Illness:   Reason for Consult Mediastinal lymphadenopathy and multiple bony lesions, suspicious for underlying malignancy.    HPI Patient is a 81-year-old female who presented to the emergency room with increasing weakness and fatigue, chest tightness, and difficulty breathing.  She also admits to a approximately 15-20 pound unintentional weight loss over the past 1-2 months.  Finally, she complains of a mild persistent right-sided headache.  She was recently admitted to the hospital in early January to accelerated hypertension and hyponatremia.  Currently she feels improved from admission, but not back to baseline.  She has no other neurologic complaints.  She denies any nausea, vomiting, constipation, or diarrhea.  She has a fair appetite.  She denies any bony pain.  She has no urinary complaints.  Patient otherwise feels well and offers no further specific.   PFSH:   Additional Past Medical and Surgical History Past medical history: TIA, asthma, viral, osteoporosis, GERD, fibrocystic breast disease, anemia, migraines, hypertension, hyponatremia.  Surgical history: Negative.  Family history: Negative and noncontributory.  Social history: No report of tobacco or alcohol.   Review of Systems:   Performance Status (ECOG) 1    Review of Systems As per HPI. Otherwise, 10 point system review was negative.   NURSING NOTES: **Vital Signs.:   21-Jan-13 13:58    Vital Signs Type: Routine    Temperature Temperature (F): 97.4    Celsius: 36.3    Temperature Source: oral    Pulse Pulse: 67    Pulse source: per Dinamap    Respirations Respirations: 18    Systolic BP Systolic BP: 140    Diastolic BP (mmHg) Diastolic BP (mmHg): 85    Mean BP: 103    BP Source: manual    Pulse Ox % Pulse Ox %: 96    Pulse Ox Activity Level: At rest    Oxygen Delivery: 2L   Physical Exam:   Physical Exam General: Thin, no acute distress. Eyes: Pink conjunctiva, anicteric  sclera. HEENT: Normocephalic, moist mucous membranes, clear oropharnyx. Lungs: Clear to auscultation bilaterally. Heart: Regular rate and rhythm. No rubs, murmurs, or gallops. Abdomen: Soft, nontender, nondistended. No organomegaly noted, normoactive bowel sounds. Musculoskeletal: No edema, cyanosis, or clubbing. Neuro: Alert, answering all questions appropriately. Cranial nerves grossly intact. Skin: No rashes or petechiae noted. Psych: Normal affect. Lymphatics: No cervical, calvicular, axillary or inguinal LAD.    Codeine: Unknown  Propranolol: Unknown  Flagyl: Unknown  Metronidazole: Unknown  Ditropan: Unknown  Aspirin: Unknown  Tavist: Unknown  Hydrochlorothiazide: Unknown  Tramadol HCl: Unknown  Vicodin: Unknown    pantoprazole 20 mg oral delayed release tablet: 1 tab(s) orally once a day (in the morning) before breakfast., Active, 0, None   clorazepate 3.75 mg oral tablet: 1 tab(s) orally 3 times a day, Active, 0, None   Flovent HFA 110 mcg/inh inhalation aerosol: 2 puff(s) inhaled 2 times a day (morning and evening), Active, 0, None   Premarin 0.3 mg oral tablet: 1 tab(s) orally once a day at lunch., Active, 0, None   Synthroid 100 mcg (0.1 mg) oral tablet: 1 tab(s) orally once a day (in the morning) 1/2 hour before breakfast., Active, 0, None   Plavix 75 mg oral tablet: 1 tab(s) orally once a day (in the morning) with food., Active, 0, None   lisinopril 40 mg oral tablet: 1 tab(s) orally once a day (in the morning), Active, 0, None   Bystolic 20 mg oral tablet: 0.5 tab (10mg) orally once a day (  in the morning) and 0.5 tab (70m) orally in the evening., Active, 0, None   Gelnique: apply gel to affected area as directed transdermal once a day. **pt not sure of strength**, Active, 0, None   Vitamin D3 1000 intl units oral tablet: 1 tab(s) orally 2 times a day, Active, 0, None   Vitamin B-12: 1 tab(s) orally once a day. **pt not sure of strength**, Active, 0, None    Maalox Antacid [place] Regular Strength: 1 teaspoonful (5 milliliters) orally as needed., Active, 0, None   acetaminophen 500 mg oral tablet: 1 tab(s) orally as needed- for pain , Active, 0, None   clonidine 0.2 mg oral tablet: 1 tab(s) orally 3 times a day (morning, afternoon, and night), Active, 0, None    Routine Chem:  21-Jan-13 02:28    Glucose, Serum 116   BUN 13   Creatinine (comp) 0.55   Sodium, Serum 123   Potassium, Serum 5.9   Chloride, Serum 82   CO2, Serum 34   Calcium (Total), Serum 8.5   Osmolality (calc) 249   eGFR (African American) >60   eGFR (Non-African American) >60   Anion Gap 7   Assessment and Plan:   Impression Mediastinal lymphadenopathy and multiple bony lesions, suspicious for underlying malignancy.    Plan 1. Possible underlying malignancy:  Have ordered a CT of the chest, abdomen, and pelvis with contrast to further evaluate for a primary.  Tumor markers as well as SIEP and UIEP are currently pending.  Given patient's advanced age and decreased performance, if she has underlying malignancy, would unlikely be able to offer any treatment.  Will further discusss with family when results are available. 2.  Headache: Recent CT scan did not reveal abnormality.  No further intervention needed at this time.   Appeciate consult, will follow.   Electronic Signatures: FDelight Hoh(MD)  (Signed 21-Jan-13 15:13)  Authored: HISTORY OF PRESENT ILLNESS, PFSH, ROS, NURSING NOTES, PE, ALLERGIES, HOME MEDICATIONS, LABS, ASSESSMENT AND PLAN   Last Updated: 21-Jan-13 15:13 by FDelight Hoh(MD)

## 2014-11-04 NOTE — H&P (Signed)
PATIENT NAME:  Alyssa Landry, Alyssa Landry MR#:  [id number] DATE OF BIRTH:  [birth year]  DATE OF ADMISSION:  08/10/2011  PRIMARY CARE PHYSICIAN: Dr. Dan Humphreys.   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 Apparently according to the patient's daughters, last night she was not able to feed herself. She was able to move her arms, however, not able to bring spoon to her mouth to feed herself so she was helped to do so. Today in the morning she was not able to talk completely. She was, according to the patient's daughters, lifting up her upper lids, however, not able to speak whatsoever. She was able to respond to their request with simple nods of her head intermittently, however, not following much commands according to the patient's daughters. No reported cough or fevers, however, according to the Emergency Room physician the patient was suspected to have pneumonia and hospitalist services were contacted for admission.   PAST MEDICAL HISTORY:  1. Last admission in January 2013 and just recently discharged on 08/07/2011 for chest pains.  2. History of CVAs/TIAs. 3. Asthma. 4. Hypothyroidism. 5. Osteoporosis. 6. Hiatal hernia with gastroesophageal reflux disease. 7. History of fibrocystic breast disease. 8. History of anemia. 9. Migraine headaches. 10. Hypertension. 11. 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 12. History of accelerated hypertension.  13. History of ovarian mass of unclear etiology. Left adnexal mass was noted on last admission which was unclear benign or malignant. The patient was evaluated by oncologist and felt that given the patient's age as well as decreased performance even if she has underlying malignancy would  not be able to offer her treatment.  14. Anxiety disorder.  MEDICATIONS FROM NURSING FACILITY: 1. Amlodipine 10 mg p.o. daily.  2. Nitroglycerin 0.4 mg an hour, 24 hour patch daily.  3. Plavix 75 mg p.o. daily.  4. Spiriva 1 capsule once daily.  5. Vitamin B12 1000 mcg p.o. daily.  6. Vitamin D3 1000 units p.o. daily.  7. Prednisone 40 mg starting dose, started on 08/08/2011, to be tapered by 10 mg daily.  8. Flovent 110 mcg inhalation 2 puffs twice daily.  9. Mechanical soft diet with aspiration precautions as well as upright for all meals.  10. Clonidine 0.2 mg p.o. every eight hours.  11. Levoxyl 100 mcg p.o. daily.  12. Protonix 20 mg p.o. daily.  13. One liter fluid restriction a day.  14. Nitroglycerin 0.4 mg sublingually as needed.  15. Tranxene 3.75 mg p.o. daily as needed.  16. Tylenol 650 mg p.o. daily as needed.  ALLERGIES: Aspirin, codeine, Ditropan, Flagyl, HCTZ, propranolol, Tavist, tramadol, as well as Vicodin.   SOCIAL HISTORY: No smoking, alcohol, or drug abuse. Lives alone, since last hospitalization discharge she is in rehab. She is able to ambulate with walker. She was seen by physical therapist yesterday and she was able to walk at least 50 feet.   FAMILY HISTORY: No heart disease, diabetes, or hypertension.   REVIEW OF SYSTEMS: Not available as the patient is mute.   PHYSICAL EXAMINATION:   VITAL SIGNS: On arrival to the hospital, temperature 97, pulse 74, respiration rate 18, blood pressure 200/81, saturation 97% on room air.   GENERAL: This is a well developed, well nourished pale  tiny Caucasian female sitting on the stretcher.   HEENT: Her pupils are equal and reactive to light. Extraocular movements intact. No icterus or conjunctivitis. Has normal hearing. Not able to assess pharynx due to the patient not opening her mouth. Not able to assess her tongue.  NECK: Thyroid is nontender. Supple. No masses. No adenopathy. No JVD or carotid bruits bilaterally.  Full range of motion.   LUNGS: Few wheezes were heard on the left but not measured on the right. Quite clear auscultation on the right anteriorly. Not able to assess posteriorly as the patient is not leaning forward. No rales, rhonchi, diminished breath sounds or wheezing otherwise was noted. No labored inspirations, increased effort, dullness to percussion, or respiratory distress.   CARDIOVASCULAR: S1, S2 appreciated. 3/6 systolic murmur was heard precordially. No gallops or rubs were noted. The patient did have some mild swelling in the lower extremities around her calves as well as pedal area, however, peripheral pulses are 2+.   ABDOMEN: Soft, nontender. Bowel sounds are present. No hepatosplenomegaly or masses were noted.   RECTAL: Deferred.   MUSCULOSKELETAL: She was able to move her upper extremities quite well. She was able to follow commands. She is able to wiggle her toes, however, not able to lift up her lower extremities from horizontal surface. No cyanosis.   SKIN: She has no rashes, lesions, erythema, nodularity, or induration. Skin was warm and dry to palpation. She has few dressings, Band-Aids on her right toes.   LYMPH: No adenopathy in the cervical region.   NEUROLOGICAL: Cranial nerves grossly intact, however, very difficult exam. Deep tendon reflexes are somewhat diminished. Sensory not able to assess. The patient is mute. She is alert, intermittently cooperative. Not able to assess her memory. No agitation or confusion was noted.   LABORATORY, DIAGNOSTIC, AND RADIOLOGICAL DATA: The patient's EKG showed normal sinus rhythm with sinus arrhythmia at 90 beats per minute, left axis deviation, left anterior fascicular block with QS in V1, V2. No acute ST-T changes were noted. No significant change since 07/29/2011.    Chest, portable, single view, 08/10/2011, /2013 atelectasis versus infiltrate right lung base. Findings likely representing left effusion and a component of  atelectasis versus infiltrate in left lung base. Chronic obstructive pulmonary disease with component of underlying pulmonary fibrosis according to the radiologist.   CT of head without contrast 08/10/2011 showed involutional changes without evidence of focal or acute abnormalities.   BUN and creatinine 15 and 0.38, sodium 128, otherwise unremarkable study. Albumin level on liver enzymes 2.8, otherwise unremarkable study. Troponin level 0.02. White blood cell count elevated to 13.0, hemoglobin 13.1, platelet count 312. Urinalysis yellow cloudy urine, negative for glucose or bilirubin, trace ketones, specific gravity 1.006, pH 8.0, negative for blood, protein, nitrites, trace leukocyte esterase, 20 red blood cells, 9 white blood cells, trace bacteria, no epithelial cells were seen as well as amorphous crystals were present   ASSESSMENT AND PLAN:  1. Cerebrovascular accident. Admit patient to medical floor. I am concerned about the patient being mute and possible damage in her left hemisphere. She has intermittently followed commands. Her upper extremity strength seems to be good. However, lower extremity strength is difficult to get due to her inability to follow commands. Will admit her to telemetry. Will get MRI of her brain as well as carotid ultrasound and echocardiogram. Will follow her on telemetry. Will start rectal aspirin at this time, however, will change her to p.o. Plavix as soon as speech therapist evaluates and clears  her for p.o. intake.  2. Malignant hypertension. Will continue nitroglycerin topically. Add labetalol IV as needed.  3. Aspiration versus bacterial pneumonia. Will check sputum cultures. Will continue Levaquin. The patient was given Rocephin as well as Zithromax IV in the Emergency Room.  4. Hyponatremia, seems to be stable, in the past was considered to be SIADH. The patient would benefit from fluid restriction, however, now she is n.p.o. and we will be giving her just a very  little amount of IV fluids. If her sodium level worsens, will discontinue IV fluids.  5. Leukocytosis, questionable due to pneumonia versus urinary tract infection. Get urine cultures as well as sputum cultures. Follow culture results as well as white blood cell count with antibiotic therapy.   TIME SPENT: 50 minutes.   ____________________________ Katharina Caperima Caydn Justen, MD rv:drc D: 08/10/2011 10:43:10 ET T: 08/10/2011 11:51:12 ET JOB#: [id number] Alyssa Delia MD ELECTRONICALLY SIGNED 08/23/2011 20:25

## 2015-01-25 ENCOUNTER — Telehealth: Payer: Self-pay

## 2015-01-25 NOTE — Telephone Encounter (Signed)
PA for gelnique 1% was approved for 01/25/15-01/25/16. Cw,lpn

## 2015-03-08 ENCOUNTER — Telehealth: Payer: Self-pay

## 2015-03-08 DIAGNOSIS — N3281 Overactive bladder: Secondary | ICD-10-CM

## 2015-03-08 NOTE — Telephone Encounter (Signed)
Kristen from [employer] called stating they can no longer get gelnique 3%. The 10% is the only dosage available now. Please advise.

## 2015-03-09 NOTE — Telephone Encounter (Signed)
She will now have to apply just 1 packet of the medication to her scan versus the 3 pumps from the dispenser.  Prescription will be oxybutynin topical 10% apply 1 packet 2 skin daily #90 packets with 6 refills.

## 2015-03-11 MED ORDER — OXYBUTYNIN CHLORIDE 10 % TD GEL: 1.0000 | Freq: Every day | TRANSDERMAL | Status: DC

## 2015-03-11 NOTE — Telephone Encounter (Signed)
New script has been sent

## 2015-06-26 ENCOUNTER — Encounter: Payer: Self-pay | Admitting: *Deleted

## 2015-06-26 ENCOUNTER — Emergency Department
Admission: EM | Admit: 2015-06-26 | Discharge: 2015-06-26 | Disposition: A | Discharge status: Home / Self Care | Payer: Medicare Other | Attending: Emergency Medicine | Admitting: Emergency Medicine

## 2015-06-26 ENCOUNTER — Emergency Department: Payer: Medicare Other

## 2015-06-26 DIAGNOSIS — G459 Transient cerebral ischemic attack, unspecified: Secondary | ICD-10-CM

## 2015-06-26 DIAGNOSIS — Z7951 Long term (current) use of inhaled steroids: Secondary | ICD-10-CM | POA: Diagnosis not present

## 2015-06-26 DIAGNOSIS — Z79899 Other long term (current) drug therapy: Secondary | ICD-10-CM | POA: Diagnosis not present

## 2015-06-26 DIAGNOSIS — Z7982 Long term (current) use of aspirin: Secondary | ICD-10-CM | POA: Insufficient documentation

## 2015-06-26 DIAGNOSIS — I10 Essential (primary) hypertension: Secondary | ICD-10-CM | POA: Diagnosis not present

## 2015-06-26 DIAGNOSIS — R4701 Aphasia: Secondary | ICD-10-CM | POA: Diagnosis present

## 2015-06-26 HISTORY — DX: Essential (primary) hypertension: I10

## 2015-06-26 HISTORY — DX: Transient cerebral ischemic attack, unspecified: G45.9

## 2015-06-26 HISTORY — DX: Chronic obstructive pulmonary disease, unspecified: J44.9

## 2015-06-26 LAB — COMPREHENSIVE METABOLIC PANEL
ALBUMIN: 3.9 g/dL (ref 3.5–5.0)
ALT: 14 U/L (ref 14–54)
ANION GAP: 6 (ref 5–15)
AST: 18 U/L (ref 15–41)
Alkaline Phosphatase: 119 U/L (ref 38–126)
BILIRUBIN TOTAL: 0.6 mg/dL (ref 0.3–1.2)
BUN: 22 mg/dL — ABNORMAL HIGH (ref 6–20)
CALCIUM: 8.8 mg/dL — AB (ref 8.9–10.3)
CHLORIDE: 96 mmol/L — AB (ref 101–111)
CO2: 27 mmol/L (ref 22–32)
CREATININE: 0.5 mg/dL (ref 0.44–1.00)
GFR calc Af Amer: >60 mL/min (ref >60)
GLUCOSE: 107 mg/dL — AB (ref 65–99)
POTASSIUM: 4.2 mmol/L (ref 3.5–5.1)
Sodium: 129 mmol/L — ABNORMAL LOW (ref 135–145)
TOTAL PROTEIN: 7.5 g/dL (ref 6.5–8.1)

## 2015-06-26 LAB — CBC WITH DIFFERENTIAL/PLATELET
BASOS PCT: 1 %
Basophils Absolute: 0.1 10*3/uL (ref 0–0.1)
EOS ABS: 0.1 10*3/uL (ref 0–0.7)
Eosinophils Relative: 1 %
HEMATOCRIT: 31.8 % — AB (ref 35.0–47.0)
Hemoglobin: 10.8 g/dL — ABNORMAL LOW (ref 12.0–16.0)
Lymphocytes Relative: 11 %
Lymphs Abs: 1 10*3/uL (ref 1.0–3.6)
MCH: 30.2 pg (ref 26.0–34.0)
MCHC: 34 g/dL (ref 32.0–36.0)
MCV: 88.7 fL (ref 80.0–100.0)
MONO ABS: 0.8 10*3/uL (ref 0.2–0.9)
MONOS PCT: 9 %
NEUTROS ABS: 7 10*3/uL — AB (ref 1.4–6.5)
Neutrophils Relative %: 78 %
PLATELETS: 322 10*3/uL (ref 150–440)
RBC: 3.59 MIL/uL — ABNORMAL LOW (ref 3.80–5.20)
RDW: 13.4 % (ref 11.5–14.5)
WBC: 9 10*3/uL (ref 3.6–11.0)

## 2015-06-26 LAB — URINALYSIS COMPLETE WITH MICROSCOPIC (ARMC ONLY)
Bacteria, UA: NONE SEEN
Bilirubin Urine: NEGATIVE
GLUCOSE, UA: NEGATIVE mg/dL
Hgb urine dipstick: NEGATIVE
KETONES UR: NEGATIVE mg/dL
Leukocytes, UA: NEGATIVE
Nitrite: NEGATIVE
Protein, ur: NEGATIVE mg/dL
SPECIFIC GRAVITY, URINE: 1.005 (ref 1.005–1.030)
SQUAMOUS EPITHELIAL / LPF: NONE SEEN
pH: 7 (ref 5.0–8.0)

## 2015-06-26 NOTE — ED Provider Notes (Signed)
[facility] Emergency Department Provider Note  ____________________________________________  Time seen: Approximately 7:20 PM  I have reviewed the triage vital signs and the nursing notes.   HISTORY  Chief Complaint Aphasia and Transient Ischemic Attack    HPI Allen Derrynnie C Brawley is a 81 y.o. female who was noticed by her daughter at about 579 AM to have a lot of word finding difficulty for about 2-3 minutes. Daughter reports that the patient had been sitting by herself for sometime before that the daughter was not sure when this word finding difficulty started. Patient has been well since about 9 AM. Patient reportedly had TIAs in the past but that was over 2 years ago. Patient is taking aspirin 325 and Plavix by mouth now. Patient is returned to baseline at the present time    Past Medical History  Diagnosis Date  . Hypertension   . COPD (chronic obstructive pulmonary disease) (HCC)   . TIA (transient ischemic attack)     There are no active problems to display for this patient.   History reviewed. No pertinent past surgical history.  Current Outpatient Rx  Name  Route  Sig  Dispense  Refill  . albuterol (PROVENTIL HFA;VENTOLIN HFA) 108 (90 BASE) MCG/ACT inhaler   Inhalation   Inhale 1-2 puffs into the lungs every 4 (four) hours as needed for wheezing or shortness of breath.         Marland Kitchen. amLODipine (NORVASC) 10 MG tablet   Oral   Take 10 mg by mouth daily.         Marland Kitchen. aspirin EC 81 MG tablet   Oral   Take 81 mg by mouth every evening.          . cholecalciferol (VITAMIN D) 1000 UNITS tablet   Oral   Take 1,000 Units by mouth daily.         . cloNIDine (CATAPRES) 0.1 MG tablet   Oral   Take 0.1 mg by mouth 3 (three) times daily.         . clopidogrel (PLAVIX) 75 MG tablet   Oral   Take 75 mg by mouth daily.         Marland Kitchen. docusate sodium (COLACE) 100 MG capsule   Oral   Take 100 mg by mouth 2 (two) times daily.          . ferrous  sulfate 325 (65 FE) MG tablet   Oral   Take 325 mg by mouth 2 (two) times daily.         . fluticasone (FLONASE) 50 MCG/ACT nasal spray   Each Nare   Place 2 sprays into both nostrils daily.          . fluticasone (FLOVENT HFA) 110 MCG/ACT inhaler   Inhalation   Inhale 2 puffs into the lungs 2 (two) times daily.         Marland Kitchen. levothyroxine (SYNTHROID, LEVOTHROID) 100 MCG tablet   Oral   Take 100 mcg by mouth daily before breakfast.         . LORazepam (ATIVAN) 0.5 MG tablet   Oral   Take 0.5 mg by mouth 3 (three) times daily as needed for anxiety.         Marland Kitchen. losartan (COZAAR) 50 MG tablet   Oral   Take 50 mg by mouth daily.         . Multiple Vitamin (MULTIVITAMIN WITH MINERALS) TABS tablet   Oral   Take 1 tablet by mouth  daily.         . nitroGLYCERIN (NITRODUR - DOSED IN MG/24 HR) 0.6 mg/hr patch   Transdermal   Place 0.6 mg onto the skin daily.         . Oxybutynin 3 (28) % (MG/ACT) GEL   Transdermal   Place 1 application onto the skin at bedtime.          . polyethylene glycol (MIRALAX / GLYCOLAX) packet   Oral   Take 17 g by mouth daily.         . ranitidine (ZANTAC) 150 MG tablet   Oral   Take 150 mg by mouth 2 (two) times daily before a meal.          . sodium bicarbonate 325 MG tablet   Oral   Take 325 mg by mouth 4 (four) times daily.         Marland Kitchen tiotropium (SPIRIVA) 18 MCG inhalation capsule   Inhalation   Place 18 mcg into inhaler and inhale daily.         . vitamin B-12 (CYANOCOBALAMIN) 1000 MCG tablet   Oral   Take 1,000 mcg by mouth daily.         . Oxybutynin Chloride 10 % GEL   Transdermal   Place 1 application onto the skin daily. Patient not taking: Reported on 06/26/2015   90 g   3     Allergies Codeine; Ditropan; Hctz; Metronidazole; Propranolol; Tavist; Tramadol; and Vicodin  Family History  Problem Relation Age of Onset  . Leukemia Mother   . Heart disease Father     Social History Social History   Substance Use Topics  . Smoking status: Never Smoker   . Smokeless tobacco: None  . Alcohol Use: None    Review of Systems Constitutional: No fever/chills Eyes: No visual changes. ENT: No sore throat. Cardiovascular: Denies chest pain. Respiratory: Denies shortness of breath. Gastrointestinal: No abdominal pain.  No nausea, no vomiting.  No diarrhea.  No constipation. Genitourinary: Negative for dysuria. Musculoskeletal: Negative for back pain. Skin: Negative for rash. Neurological: See history of present illness  10-point ROS otherwise negative.  ____________________________________________   PHYSICAL EXAM:  VITAL SIGNS: ED Triage Vitals  Enc Vitals Group     BP 06/26/15 1729 175/80 mmHg     Pulse Rate 06/26/15 1729 93     Resp 06/26/15 1729 18     Temp 06/26/15 1729 97.8 F (36.6 C)     Temp Source 06/26/15 1729 Oral     SpO2 06/26/15 1729 97 %     Weight 06/26/15 1729 104 lb (47.174 kg)     Height 06/26/15 1729  (1.549 m)     Head Cir --      Peak Flow --      Pain Score --      Pain Loc --      Pain Edu? --      Excl. in GC? --     Constitutional: Alert and oriented. Well appearing and in no acute distress. Eyes: Conjunctivae are normal. PERRL. EOMI. Head: Atraumatic. Nose: No congestion/rhinnorhea. Mouth/Throat: Mucous membranes are moist.  Oropharynx non-erythematous. Neck: No stridor. No bruits  Cardiovascular: Normal rate, regular rhythm. Grossly normal heart sounds.  Good peripheral circulation. Respiratory: Normal respiratory effort.  No retractions. Lungs CTAB. Gastrointestinal: Soft and nontender. No distention. No abdominal bruits. No CVA tenderness. Musculoskeletal: No lower extremity tenderness nor edema.  No joint effusions. Neurologic:  Normal speech and language.  No gross focal neurologic deficits are appreciated. No gait instability. Skin:  Skin is warm, dry and intact. No rash noted. Psychiatric: Mood and affect are normal. Speech  and behavior are normal.  ____________________________________________   LABS (all labs ordered are listed, but only abnormal results are displayed)  Labs Reviewed  COMPREHENSIVE METABOLIC PANEL - Abnormal; Notable for the following:    Sodium 129 (*)    Chloride 96 (*)    Glucose, Bld 107 (*)    BUN 22 (*)    Calcium 8.8 (*)    All other components within normal limits  CBC WITH DIFFERENTIAL/PLATELET - Abnormal; Notable for the following:    RBC 3.59 (*)    Hemoglobin 10.8 (*)    HCT 31.8 (*)    Neutro Abs 7.0 (*)    All other components within normal limits  URINALYSIS COMPLETEWITH MICROSCOPIC ([facility] ONLY) - Abnormal; Notable for the following:    Color, Urine STRAW (*)    APPearance CLEAR (*)    All other components within normal limits   ____________________________________________  EKG  EKG read and interpreted by me shows normal sinus rhythm at a rate of 94 slight possible right axis the baseline is very irregular and the way QRS complex in lead 1 is very small there are no apparent acute ST-T wave changes  ____________________________________________  RADIOLOGY  No acute strokes are seen on the head CT by radiology Chest x-ray is read as no acute disease by radiology ____________________________________________   PROCEDURES   Hospitalist spoke with Dr. Herma Carson the neurologist as did I Dr. the neurologist feels like the patient can complete outpatient workup since she is taking both aspirin and Plavix. Hospitalist explained this to the family ____________________________________________   INITIAL IMPRESSION / ASSESSMENT AND PLAN / ED COURSE  Pertinent labs & imaging results that were available during my care of the patient were reviewed by me and considered in my medical decision making (see chart for details).  ____________________________________________   FINAL CLINICAL IMPRESSION(S) / ED DIAGNOSES  Final diagnoses:  Transient cerebral ischemia, unspecified  transient cerebral ischemia type      Arnaldo Natal, MD 06/26/15 2329

## 2015-06-26 NOTE — ED Notes (Signed)
Introduced self to patient. Patient updated on current plan of care. No further needs at this time.  

## 2015-06-26 NOTE — ED Notes (Signed)
Patient with no complaints at this time. Respirations even and unlabored. Skin warm/dry. Discharge instructions reviewed with patient at this time. Patient given opportunity to voice concerns/ask questions. IV removed per policy and band-aid applied to site. Patient discharged at this time and left Emergency Department, via wheelchair.   

## 2015-06-26 NOTE — ED Notes (Signed)
Admitting MD at bedside, updating patient and family on discharge disposition.

## 2015-06-26 NOTE — ED Notes (Signed)
Admitting MD at bedside.

## 2015-06-26 NOTE — Consult Note (Signed)
[facility] - [place] at [facility]   PATIENT NAME: Alyssa Landry    MR#:  [id number]  DATE OF BIRTH:  [birth year]  DATE OF CONSULT:  06/26/2015  PRIMARY CARE PHYSICIAN: Rafael Bihari, MD   REQUESTING/REFERRING PHYSICIAN: Dr. Dorothea Glassman  CHIEF COMPLAINT:   Chief Complaint  Patient presents with  . Aphasia  . Transient Ischemic Attack    HISTORY OF PRESENT ILLNESS:  Alyssa Landry  is a 81 y.o. female with a known history of COPD, hypertension, history of previous TIA, hypothyroidism, GERD who presented to the hospital due to slurred speech and aphasia. As per the daughter gives most of the history when her mother woke up this morning and she was talking to her she couldn't get the right words out. This only lasted a few minutes and then resolved on its own. She denied any headache, nausea, vomiting, chest pain, shortness of breath or any other associated symptoms. Given the afternoon today patient developed some dizziness so daughter was a bit concerned brought her to the ER for further evaluation. Hospitalist services were contacted for possible admission for TIA/CVA workup. Clinically The patient is back to baseline and denies any acute complaints.  PAST MEDICAL HISTORY:   Past Medical History  Diagnosis Date  . Hypertension   . COPD (chronic obstructive pulmonary disease) (HCC)   . TIA (transient ischemic attack)     PAST SURGICAL HISTOIRY:  History reviewed. No pertinent past surgical history.  SOCIAL HISTORY:   Social History  Substance Use Topics  . Smoking status: Never Smoker   . Smokeless tobacco: Not on file  . Alcohol Use: Not on file    FAMILY HISTORY:  History reviewed. No pertinent family history.  DRUG ALLERGIES:   Allergies  Allergen Reactions  . Codeine Other (See Comments)    Reaction:  Unknown   . Ditropan [Oxybutynin] Other (See Comments)    Reaction:  Unknown   . Hctz [Hydrochlorothiazide] Other (See  Comments)    Reaction:  Causes pts sodium/potassium levels to drop significantly   . Metronidazole Other (See Comments)    Reaction:  Unknown   . Propranolol Other (See Comments)    Reaction:  Unknown   . Tavist [Clemastine] Other (See Comments)    Reaction:  Unknown   . Tramadol Other (See Comments)    Reaction:  Unknown   . Vicodin [Hydrocodone-Acetaminophen] Other (See Comments)    Reaction:  Unknown     REVIEW OF SYSTEMS:   Review of Systems  Constitutional: Negative for fever and weight loss.  HENT: Negative for congestion, nosebleeds and tinnitus.   Eyes: Negative for blurred vision, double vision and redness.  Respiratory: Negative for cough, hemoptysis and shortness of breath.   Cardiovascular: Negative for chest pain, orthopnea, leg swelling and PND.  Gastrointestinal: Negative for nausea, vomiting, abdominal pain, diarrhea and melena.  Genitourinary: Negative for dysuria, urgency and hematuria.  Musculoskeletal: Negative for joint pain and falls.  Neurological: Positive for speech change (resolved). Negative for dizziness, tingling, sensory change, focal weakness, seizures, weakness and headaches.  Endo/Heme/Allergies: Negative for polydipsia. Does not bruise/bleed easily.  Psychiatric/Behavioral: Negative for depression and memory loss. The patient is not nervous/anxious.      MEDICATIONS AT HOME:   Prior to Admission medications   Medication Sig Start Date End Date Taking? Authorizing Provider  albuterol (PROVENTIL HFA;VENTOLIN HFA) 108 (90 BASE) MCG/ACT inhaler Inhale 1-2 puffs into the lungs every 4 (four) hours as needed for wheezing  or shortness of breath.   Yes Historical Provider, MD  amLODipine (NORVASC) 10 MG tablet Take 10 mg by mouth daily.   Yes Historical Provider, MD  aspirin EC 81 MG tablet Take 81 mg by mouth every evening.    Yes Historical Provider, MD  cholecalciferol (VITAMIN D) 1000 UNITS tablet Take 1,000 Units by mouth daily.   Yes Historical  Provider, MD  cloNIDine (CATAPRES) 0.1 MG tablet Take 0.1 mg by mouth 3 (three) times daily.   Yes Historical Provider, MD  clopidogrel (PLAVIX) 75 MG tablet Take 75 mg by mouth daily.   Yes Historical Provider, MD  docusate sodium (COLACE) 100 MG capsule Take 100 mg by mouth 2 (two) times daily.    Yes Historical Provider, MD  ferrous sulfate 325 (65 FE) MG tablet Take 325 mg by mouth 2 (two) times daily.   Yes Historical Provider, MD  fluticasone (FLONASE) 50 MCG/ACT nasal spray Place 2 sprays into both nostrils daily.    Yes Historical Provider, MD  fluticasone (FLOVENT HFA) 110 MCG/ACT inhaler Inhale 2 puffs into the lungs 2 (two) times daily.   Yes Historical Provider, MD  levothyroxine (SYNTHROID, LEVOTHROID) 100 MCG tablet Take 100 mcg by mouth daily before breakfast.   Yes Historical Provider, MD  LORazepam (ATIVAN) 0.5 MG tablet Take 0.5 mg by mouth 3 (three) times daily as needed for anxiety.   Yes Historical Provider, MD  losartan (COZAAR) 50 MG tablet Take 50 mg by mouth daily.   Yes Historical Provider, MD  Multiple Vitamin (MULTIVITAMIN WITH MINERALS) TABS tablet Take 1 tablet by mouth daily.   Yes Historical Provider, MD  nitroGLYCERIN (NITRODUR - DOSED IN MG/24 HR) 0.6 mg/hr patch Place 0.6 mg onto the skin daily.   Yes Historical Provider, MD  Oxybutynin 3 (28) % (MG/ACT) GEL Place 1 application onto the skin at bedtime.    Yes Historical Provider, MD  polyethylene glycol (MIRALAX / GLYCOLAX) packet Take 17 g by mouth daily.   Yes Historical Provider, MD  ranitidine (ZANTAC) 150 MG tablet Take 150 mg by mouth 2 (two) times daily before a meal.    Yes Historical Provider, MD  sodium bicarbonate 325 MG tablet Take 325 mg by mouth 4 (four) times daily.   Yes Historical Provider, MD  tiotropium (SPIRIVA) 18 MCG inhalation capsule Place 18 mcg into inhaler and inhale daily.   Yes Historical Provider, MD  vitamin B-12 (CYANOCOBALAMIN) 1000 MCG tablet Take 1,000 mcg by mouth daily.   Yes  Historical Provider, MD  Oxybutynin Chloride 10 % GEL Place 1 application onto the skin daily. Patient not taking: Reported on 06/26/2015 03/11/15   Carollee HerterShannon A McGowan, PA-C      VITAL SIGNS:  Blood pressure 137/61, pulse 90, temperature 97.8 F (36.6 C), temperature source Oral, resp. rate 18, height 5\' 1"  (1.549 m), weight 47.174 kg (104 lb), SpO2 94 %.  PHYSICAL EXAMINATION:  GENERAL:  81 y.o.-year-old patient lying in the bed with no acute distress.  EYES: Pupils equal, round, reactive to light and accommodation. No scleral icterus. Extraocular muscles intact.  HEENT: Head atraumatic, normocephalic. Oropharynx and nasopharynx clear.  NECK:  Supple, no jugular venous distention. No thyroid enlargement, no tenderness.  LUNGS: Normal breath sounds bilaterally, no wheezing, rales, rhonchi . No use of accessory muscles of respiration.  CARDIOVASCULAR: S1, S2, RRR. No murmurs, rubs, gallops, clicks.  ABDOMEN: Soft, nontender, nondistended. Bowel sounds present. No organomegaly or mass.  EXTREMITIES: No pedal edema, cyanosis, or clubbing.  NEUROLOGIC: Cranial nerves II through XII are intact. No focal motor or sensory deficits appreciated bilaterally  PSYCHIATRIC: The patient is alert and oriented x 3. Good affect SKIN: No obvious rash, lesion, or ulcer.   LABORATORY PANEL:   CBC  Recent Labs Lab 06/26/15 1835  WBC 9.0  HGB 10.8*  HCT 31.8*  PLT 322   ------------------------------------------------------------------------------------------------------------------  Chemistries   Recent Labs Lab 06/26/15 1835  NA 129*  K 4.2  CL 96*  CO2 27  GLUCOSE 107*  BUN 22*  CREATININE 0.50  CALCIUM 8.8*  AST 18  ALT 14  ALKPHOS 119  BILITOT 0.6   ------------------------------------------------------------------------------------------------------------------  Cardiac Enzymes No results for input(s): TROPONINI in the last 168  hours. ------------------------------------------------------------------------------------------------------------------  RADIOLOGY:  Ct Head Wo Contrast  06/26/2015  CLINICAL DATA:  Acute onset slurred speech this morning. Presyncope. Hypertension. Transient ischemic attacks. EXAM: CT HEAD WITHOUT CONTRAST TECHNIQUE: Contiguous axial images were obtained from the base of the skull through the vertex without intravenous contrast. COMPARISON:  08/11/2012 FINDINGS: There is no evidence of intracranial hemorrhage, brain edema, or other signs of acute infarction. There is no evidence of intracranial mass lesion or mass effect. No abnormal extraaxial fluid collections are identified. Mild cerebral atrophy and chronic small vessel disease are stable in appearance. No evidence of hydrocephalus. No skull abnormality identified. IMPRESSION: No acute intracranial abnormality. Stable mild cerebral atrophy and chronic small vessel disease. Electronically Signed   By: Myles Rosenthal M.D.   On: 06/26/2015 18:21   Dg Chest Portable 1 View  06/26/2015  CLINICAL DATA:  Episode of difficulty speaking this morning while sitting in chair, could not get words out, near syncopal this afternoon, sinus congestion, history hypertension, COPD EXAM: PORTABLE CHEST 1 VIEW COMPARISON:  Portable exam 1814 hours compared to 08/11/2012 FINDINGS: Upper normal heart size. Atherosclerotic calcification aorta. Mediastinal contours and pulmonary vascularity normal. Emphysematous and bronchitic changes consistent with COPD. No acute infiltrate, pleural effusion or pneumothorax. Diffuse osseous demineralization. IMPRESSION: COPD changes. No acute abnormalities. Electronically Signed   By: Ulyses Southward M.D.   On: 06/26/2015 18:26     IMPRESSION AND PLAN:   81 year old female with past medical history of COPD, history of TIAs, hypothyroidism, anxiety, history of urinary incontinence, who presented to the hospital due to slurred speech and  aphasia.  #1 aphasia/slurred speech-patient's clinical symptoms have resolved now and she is back to baseline. This possibly could be secondary to a TIA. Her CT head on admission is actually negative. -Patient given her advanced age and even if she was  was noted to have a possible small stroke on MRI, her management would not change.  This plan was discussed over the phone w/ Neurology Dr. Loretha Brasil.   -As per neurology then recommended an outpatient Holter monitor and a carotid Doppler to be done. -For now would continue dual antiplatelet therapy with aspirin and Plavix and follow up with PCP as an outpatient. Patient is clinically asymptomatic and this was discussed with the daughter at bedside and also with the patient and they're in agreement with this plan.  #2 hypertension-continue Norvasc, clonidine, losartan.  #3 hypothyroidism-continue Synthroid.  #4 anxiety-continue Ativan.  #5 GERD-continue Zantac.  #6 COPD-no acute exacerbation. Continue Spiriva, albuterol inhaler as needed.  #7 urinary incontinence-continue oxybutynin gel.  Patient is to be discharged home with close follow-up with her primary care physician as an outpatient. She is to have an outpatient carotid Doppler and also Holter monitor which is to be arranged through  primary care physician. The patient and the ER physician are in agreement with this plan.  All the records are reviewed and case discussed with Consulting provider. Management plans discussed with the patient, family and they are in agreement.  CODE STATUS: Full  TOTAL TIME TAKING CARE OF THIS PATIENT: 50 minutes.    Houston Siren M.D on 06/26/2015 at 9:02 PM  Between 7am to 6pm - Pager - [phone]  After 6pm go to [email or web address] - password [email or web address] [facility]  [place] [facility]  Office  [phone]  CC: Primary care Physician: Rafael Bihari, MD

## 2015-06-26 NOTE — ED Notes (Signed)
Daughter states this AM pt had an episode of difficulty speaking, states this AM she was sitting in her chair and couldn't get her words out, pt states this afternoon she felt like she was going to pass out, states some sinus congestion, pt awake and alert

## 2015-06-26 NOTE — ED Notes (Signed)
Patient given food tray per patient request.

## 2015-09-13 ENCOUNTER — Encounter: Payer: Self-pay | Admitting: *Deleted

## 2015-09-17 ENCOUNTER — Ambulatory Visit: Payer: Self-pay | Admitting: Urology

## 2015-09-24 ENCOUNTER — Ambulatory Visit (INDEPENDENT_AMBULATORY_CARE_PROVIDER_SITE_OTHER): Payer: Medicare Other | Admitting: Urology

## 2015-09-24 ENCOUNTER — Encounter: Payer: Self-pay | Admitting: Urology

## 2015-09-24 VITALS — BP 151/72 | HR 102 | Ht <= 58 in | Wt 106.9 lb

## 2015-09-24 DIAGNOSIS — L280 Lichen simplex chronicus: Secondary | ICD-10-CM

## 2015-09-24 DIAGNOSIS — N3941 Urge incontinence: Secondary | ICD-10-CM

## 2015-09-24 LAB — BLADDER SCAN AMB NON-IMAGING: SCAN RESULT: 0

## 2015-09-24 MED ORDER — OXYBUTYNIN CHLORIDE 10 % TD GEL: 1.0000 | Freq: Every day | TRANSDERMAL | Status: DC

## 2015-09-24 NOTE — Progress Notes (Signed)
09/24/2015 2:00 PM   Alyssa Landry 11/27/1918 [id number]  Referring provider: Rafael BihariJohn B Walker III, MD [phone] [facility] [place], [place] [id number]  Chief Complaint  Patient presents with  . Urinary Incontinence    HPI: Patient is a 82 year old Caucasian female who is using oxybutynin topical gel for her urinary incontinence who presents today for her yearly office visit.  Patient continues to use the topical gel.  She does not experience any rash or irritation on application sites.  She is still experiencing urinary incontinence and wears pads daily.  She states she uses one poise pad a day.  Her PVR today is 0 mL.   She still experiencing frequent urination, urgency, nocturia and leakage.  She does not have dysuria, gross hematuria or suprapubic pain.  She does not have fevers, chills, nausea or vomiting.  She did suffer a urinary tract infection at the end of February 2017.  It was pansensitive Escherichia coli and she was treated with the appropriate antibiotics.  Her symptoms at the time of the infection were dysuria, but the dysuria has now abated.   PMH: Past Medical History  Diagnosis Date  . Hypertension   . COPD (chronic obstructive pulmonary disease) (HCC)   . TIA (transient ischemic attack)   . Acid reflux   . Arthritis   . Diabetes (HCC)   . Hyperlipidemia   . Anxiety   . Hypothyroidism   . Hiatal hernia   . Asthma   . Incontinence     Surgical History: Past Surgical History  Procedure Laterality Date  . Abdominal hysterectomy  1966    Home Medications:    Medication List       This list is accurate as of: 09/24/15 11:59 PM.  Always use your most recent med list.               albuterol 108 (90 Base) MCG/ACT inhaler  Commonly known as:  PROVENTIL HFA;VENTOLIN HFA  Inhale 1-2 puffs into the lungs every 4 (four) hours as needed for wheezing or shortness of breath.     amLODipine 10 MG tablet  Commonly known as:   NORVASC  Take 10 mg by mouth daily.     aspirin EC 81 MG tablet  Take 81 mg by mouth every evening.     cholecalciferol 1000 units tablet  Commonly known as:  VITAMIN D  Take 1,000 Units by mouth daily.     cloNIDine 0.1 MG tablet  Commonly known as:  CATAPRES  Take 0.1 mg by mouth 3 (three) times daily.     clopidogrel 75 MG tablet  Commonly known as:  PLAVIX  Take 75 mg by mouth daily.     docusate sodium 100 MG capsule  Commonly known as:  COLACE  Take 100 mg by mouth 2 (two) times daily.     ENSURE PO  Take by mouth. Liquid form     ferrous sulfate 325 (65 FE) MG tablet  Take 325 mg by mouth 2 (two) times daily.     fluticasone 110 MCG/ACT inhaler  Commonly known as:  FLOVENT HFA  Inhale 2 puffs into the lungs 2 (two) times daily.     fluticasone 50 MCG/ACT nasal spray  Commonly known as:  FLONASE  Place 2 sprays into both nostrils daily.     levothyroxine 100 MCG tablet  Commonly known as:  SYNTHROID, LEVOTHROID  Take 100 mcg by mouth daily before breakfast.  LORazepam 0.5 MG tablet  Commonly known as:  ATIVAN  Take 0.5 mg by mouth 3 (three) times daily as needed for anxiety.     losartan 50 MG tablet  Commonly known as:  COZAAR  Take 50 mg by mouth daily.     multivitamin with minerals Tabs tablet  Take 1 tablet by mouth daily.     nitroGLYCERIN 0.6 mg/hr patch  Commonly known as:  NITRODUR - Dosed in mg/24 hr  Place 0.6 mg onto the skin daily.     Oxybutynin 3 (28) % (MG/ACT) Gel  Place 1 application onto the skin at bedtime. Reported on 09/24/2015     Oxybutynin Chloride 10 % Gel  Place 1 application onto the skin daily.     polyethylene glycol packet  Commonly known as:  MIRALAX / GLYCOLAX  Take 17 g by mouth daily.     ranitidine 150 MG tablet  Commonly known as:  ZANTAC  Take 150 mg by mouth 2 (two) times daily before a meal.     sodium bicarbonate 325 MG tablet  Take 325 mg by mouth 4 (four) times daily.     tiotropium 18 MCG  inhalation capsule  Commonly known as:  SPIRIVA  Place 18 mcg into inhaler and inhale daily.     vitamin B-12 1000 MCG tablet  Commonly known as:  CYANOCOBALAMIN  Take 1,000 mcg by mouth daily.        Allergies:  Allergies  Allergen Reactions  . Codeine Other (See Comments)    Reaction:  Unknown   . Ditropan [Oxybutynin] Other (See Comments)    Reaction:  Unknown   . Hctz [Hydrochlorothiazide] Other (See Comments)    Reaction:  Causes pts sodium/potassium levels to drop significantly   . Metronidazole Other (See Comments)    Reaction:  Unknown   . Propranolol Other (See Comments)    Reaction:  Unknown   . Tavist [Clemastine] Other (See Comments)    Reaction:  Unknown   . Tramadol Other (See Comments)    Reaction:  Unknown   . Vicodin [Hydrocodone-Acetaminophen] Other (See Comments)    Reaction:  Unknown     Family History: Family History  Problem Relation Age of Onset  . Leukemia Mother   . Heart disease Father   . Hypertension      Social History:  reports that she has never smoked. She does not have any smokeless tobacco history on file. She reports that she does not drink alcohol or use illicit drugs.  ROS: UROLOGY Frequent Urination?: Yes Hard to postpone urination?: No Burning/pain with urination?: No Get up at night to urinate?: Yes Leakage of urine?: Yes Urine stream starts and stops?: No Trouble starting stream?: No Do you have to strain to urinate?: No Blood in urine?: No Urinary tract infection?: No Sexually transmitted disease?: No Injury to kidneys or bladder?: No Painful intercourse?: No Weak stream?: No Currently pregnant?: No Vaginal bleeding?: No Last menstrual period?: n  Gastrointestinal Nausea?: No Vomiting?: No Indigestion/heartburn?: No Diarrhea?: No Constipation?: No  Constitutional Fever: No Night sweats?: No Weight loss?: No Fatigue?: No  Skin Skin rash/lesions?: Yes Itching?: Yes  Eyes Blurred vision?: No Double  vision?: No  Ears/Nose/Throat Sore throat?: No Sinus problems?: No  Hematologic/Lymphatic Swollen glands?: No Easy bruising?: Yes  Cardiovascular Leg swelling?: Yes Chest pain?: No  Respiratory Cough?: No Shortness of breath?: No  Endocrine Excessive thirst?: No  Musculoskeletal Back pain?: No Joint pain?: Yes  Neurological Headaches?: No Dizziness?:  No  Psychologic Depression?: No Anxiety?: Yes  Physical Exam: BP 151/72 mmHg  Pulse 102  Ht  (1.448 m)  Wt 106 lb 14.4 oz (48.49 kg)  BMI 23.13 kg/m2  Constitutional: Well nourished. Alert and oriented, No acute distress. HEENT: [place] AT, moist mucus membranes. Trachea midline, no masses. Cardiovascular: No clubbing, cyanosis, or edema. Respiratory: Normal respiratory effort, no increased work of breathing. GI: Abdomen is soft, non tender, non distended, no abdominal masses. Liver and spleen not palpable.  No hernias appreciated.  Stool sample for occult testing is not indicated.   GU: No CVA tenderness.  No bladder fullness or masses.  External genitalia with lichenification, normal pubic hair distribution, no lesions.  Urethral carbuncle is noted   No urethral masses, tenderness and/or tenderness. No bladder fullness, tenderness or masses. Normal vagina mucosa, good estrogen effect, no discharge, no lesions, good pelvic support, no cystocele or rectocele noted.  Uterus, cervix and ovaries are surgically absent.   No pelvic masses are noted. Anus and perineum are without rashes or lesions.    Skin: No rashes, bruises or suspicious lesions. Lymph: No cervical or inguinal adenopathy. Neurologic: Grossly intact, no focal deficits, moving all 4 extremities. Psychiatric: Normal mood and affect.  Laboratory Data: Lab Results  Component Value Date   WBC 6.0 09/26/2015   HGB 10.3* 09/26/2015   HCT 29.5* 09/26/2015   MCV 87.1 09/26/2015   PLT 270 09/26/2015   Lab Results  Component Value Date   CREATININE 0.50  09/26/2015   Lab Results  Component Value Date   TSH 9.34* 08/11/2012      Component Value Date/Time   CHOL 220* 08/10/2011 0619   HDL 106* 08/10/2011 0619   VLDL 15 08/10/2011 0619   LDLCALC 99 08/10/2011 0619   Lab Results  Component Value Date   AST 18 06/26/2015   Lab Results  Component Value Date   ALT 14 06/26/2015    Pertinent Imaging: Results for ALANEA, WOOLRIDGE (MRN [id number]) as of 09/26/2015 13:52  Ref. Range 09/24/2015 15:18  Scan Result Unknown 0    Assessment & Plan:    1. Urge incontinence:   Patient's urge incontinence is reduced with oxybutynin gel.  She will continue that prescription.  A refill sent to her pharmacy.  She will return in 1 year for PVR and exam.  - BLADDER SCAN AMB NON-IMAGING  2. Lichenification:    Patient was found to have lichenification of her labia.  I have advised her and her daughter to apply protective barrier creams to the labial to prevent irritation from the incontinence pads.  She return in 1 year for an exam.    3. UTI:   Resolved.  I did express to the patient that we are happy to see her for urinary tract issues whenever they may occur. If she should have difficulty in attaining an appointment, she can specifically ask for me.  Return in about 1 year (around 09/23/2016) for PVR and exam.  These notes generated with voice recognition software. I apologize for typographical errors.  Michiel Cowboy, PA-C  [facility] [address] [place], [place] [address] [phone]

## 2015-09-25 ENCOUNTER — Observation Stay
Admission: EM | Admit: 2015-09-25 | Discharge: 2015-09-26 | Disposition: A | Discharge status: Home / Self Care | Payer: Medicare Other | Attending: Internal Medicine | Admitting: Internal Medicine

## 2015-09-25 ENCOUNTER — Inpatient Hospital Stay
Admit: 2015-09-25 | Discharge: 2015-09-25 | Disposition: A | Discharge status: Home / Self Care | Payer: Medicare Other | Attending: Internal Medicine | Admitting: Internal Medicine

## 2015-09-25 DIAGNOSIS — J45909 Unspecified asthma, uncomplicated: Secondary | ICD-10-CM | POA: Diagnosis not present

## 2015-09-25 DIAGNOSIS — M1990 Unspecified osteoarthritis, unspecified site: Secondary | ICD-10-CM | POA: Diagnosis not present

## 2015-09-25 DIAGNOSIS — Z885 Allergy status to narcotic agent status: Secondary | ICD-10-CM | POA: Insufficient documentation

## 2015-09-25 DIAGNOSIS — I452 Bifascicular block: Secondary | ICD-10-CM | POA: Insufficient documentation

## 2015-09-25 DIAGNOSIS — E785 Hyperlipidemia, unspecified: Secondary | ICD-10-CM | POA: Insufficient documentation

## 2015-09-25 DIAGNOSIS — I639 Cerebral infarction, unspecified: Secondary | ICD-10-CM

## 2015-09-25 DIAGNOSIS — I440 Atrioventricular block, first degree: Secondary | ICD-10-CM | POA: Insufficient documentation

## 2015-09-25 DIAGNOSIS — K219 Gastro-esophageal reflux disease without esophagitis: Secondary | ICD-10-CM | POA: Diagnosis not present

## 2015-09-25 DIAGNOSIS — F419 Anxiety disorder, unspecified: Secondary | ICD-10-CM | POA: Diagnosis not present

## 2015-09-25 DIAGNOSIS — I10 Essential (primary) hypertension: Secondary | ICD-10-CM | POA: Diagnosis not present

## 2015-09-25 DIAGNOSIS — Z888 Allergy status to other drugs, medicaments and biological substances status: Secondary | ICD-10-CM | POA: Diagnosis not present

## 2015-09-25 DIAGNOSIS — Z79899 Other long term (current) drug therapy: Secondary | ICD-10-CM | POA: Insufficient documentation

## 2015-09-25 DIAGNOSIS — R55 Syncope and collapse: Secondary | ICD-10-CM | POA: Diagnosis not present

## 2015-09-25 DIAGNOSIS — Z7951 Long term (current) use of inhaled steroids: Secondary | ICD-10-CM | POA: Diagnosis not present

## 2015-09-25 DIAGNOSIS — R32 Unspecified urinary incontinence: Secondary | ICD-10-CM | POA: Insufficient documentation

## 2015-09-25 DIAGNOSIS — I959 Hypotension, unspecified: Secondary | ICD-10-CM | POA: Insufficient documentation

## 2015-09-25 DIAGNOSIS — K449 Diaphragmatic hernia without obstruction or gangrene: Secondary | ICD-10-CM | POA: Insufficient documentation

## 2015-09-25 DIAGNOSIS — Z90710 Acquired absence of both cervix and uterus: Secondary | ICD-10-CM | POA: Diagnosis not present

## 2015-09-25 DIAGNOSIS — I739 Peripheral vascular disease, unspecified: Secondary | ICD-10-CM | POA: Insufficient documentation

## 2015-09-25 DIAGNOSIS — Z7902 Long term (current) use of antithrombotics/antiplatelets: Secondary | ICD-10-CM | POA: Diagnosis not present

## 2015-09-25 DIAGNOSIS — Z8673 Personal history of transient ischemic attack (TIA), and cerebral infarction without residual deficits: Secondary | ICD-10-CM | POA: Diagnosis not present

## 2015-09-25 DIAGNOSIS — E039 Hypothyroidism, unspecified: Secondary | ICD-10-CM | POA: Insufficient documentation

## 2015-09-25 DIAGNOSIS — J449 Chronic obstructive pulmonary disease, unspecified: Secondary | ICD-10-CM | POA: Insufficient documentation

## 2015-09-25 DIAGNOSIS — E119 Type 2 diabetes mellitus without complications: Secondary | ICD-10-CM | POA: Insufficient documentation

## 2015-09-25 DIAGNOSIS — Z7982 Long term (current) use of aspirin: Secondary | ICD-10-CM | POA: Diagnosis not present

## 2015-09-25 LAB — BASIC METABOLIC PANEL
Anion gap: 8 (ref 5–15)
BUN: 16 mg/dL (ref 6–20)
CALCIUM: 8.9 mg/dL (ref 8.9–10.3)
CO2: 28 mmol/L (ref 22–32)
CREATININE: 0.64 mg/dL (ref 0.44–1.00)
Chloride: 93 mmol/L — ABNORMAL LOW (ref 101–111)
GFR calc Af Amer: >60 mL/min (ref >60)
GLUCOSE: 98 mg/dL (ref 65–99)
POTASSIUM: 3.9 mmol/L (ref 3.5–5.1)
SODIUM: 129 mmol/L — AB (ref 135–145)

## 2015-09-25 LAB — CBC
HCT: 34.1 % — ABNORMAL LOW (ref 35.0–47.0)
Hemoglobin: 11.7 g/dL — ABNORMAL LOW (ref 12.0–16.0)
MCH: 30 pg (ref 26.0–34.0)
MCHC: 34.2 g/dL (ref 32.0–36.0)
MCV: 87.7 fL (ref 80.0–100.0)
PLATELETS: 303 10*3/uL (ref 150–440)
RBC: 3.89 MIL/uL (ref 3.80–5.20)
RDW: 13 % (ref 11.5–14.5)
WBC: 7.6 10*3/uL (ref 3.6–11.0)

## 2015-09-25 LAB — URINALYSIS COMPLETE WITH MICROSCOPIC (ARMC ONLY)
BACTERIA UA: NONE SEEN
BILIRUBIN URINE: NEGATIVE
GLUCOSE, UA: NEGATIVE mg/dL
HGB URINE DIPSTICK: NEGATIVE
KETONES UR: NEGATIVE mg/dL
LEUKOCYTES UA: NEGATIVE
NITRITE: NEGATIVE
Protein, ur: NEGATIVE mg/dL
SPECIFIC GRAVITY, URINE: 1.006 (ref 1.005–1.030)
Squamous Epithelial / LPF: NONE SEEN
pH: 8 (ref 5.0–8.0)

## 2015-09-25 LAB — TROPONIN I

## 2015-09-25 MED ORDER — LEVOTHYROXINE SODIUM 100 MCG PO TABS
100.0000 ug | ORAL_TABLET | Freq: Every day | ORAL | Status: DC
  Administered 2015-09-26: 100 ug via ORAL
  Filled 2015-09-25: qty 1

## 2015-09-25 MED ORDER — TIOTROPIUM BROMIDE MONOHYDRATE 18 MCG IN CAPS: 18.0000 ug | ORAL_CAPSULE | Freq: Every day | RESPIRATORY_TRACT | Status: DC

## 2015-09-25 MED ORDER — HEPARIN SODIUM (PORCINE) 5000 UNIT/ML IJ SOLN
5000.0000 [IU] | Freq: Three times a day (TID) | INTRAMUSCULAR | Status: DC
  Administered 2015-09-25 – 2015-09-26 (×2): 5000 [IU] via SUBCUTANEOUS
  Filled 2015-09-25 (×3): qty 1

## 2015-09-25 MED ORDER — VITAMIN B-12 1000 MCG PO TABS
1000.0000 ug | ORAL_TABLET | Freq: Every day | ORAL | Status: DC
  Administered 2015-09-26: 1000 ug via ORAL
  Filled 2015-09-25: qty 1

## 2015-09-25 MED ORDER — FLUTICASONE PROPIONATE 50 MCG/ACT NA SUSP: 2.0000 | Freq: Every day | NASAL | Status: DC

## 2015-09-25 MED ORDER — POLYETHYLENE GLYCOL 3350 17 G PO PACK
17.0000 g | PACK | Freq: Every day | ORAL | Status: DC
  Administered 2015-09-26: 17 g via ORAL
  Filled 2015-09-25: qty 1

## 2015-09-25 MED ORDER — ADULT MULTIVITAMIN W/MINERALS CH
1.0000 | ORAL_TABLET | Freq: Every day | ORAL | Status: DC
  Administered 2015-09-26: 1 via ORAL
  Filled 2015-09-25: qty 1

## 2015-09-25 MED ORDER — BUDESONIDE 0.25 MG/2ML IN SUSP
0.2500 mg | Freq: Two times a day (BID) | RESPIRATORY_TRACT | Status: DC
Start: 2015-09-25 — End: 2015-09-26
  Administered 2015-09-25 – 2015-09-26 (×2): 0.25 mg via RESPIRATORY_TRACT
  Filled 2015-09-25 (×3): qty 2

## 2015-09-25 MED ORDER — LORAZEPAM 0.5 MG PO TABS
0.5000 mg | ORAL_TABLET | Freq: Three times a day (TID) | ORAL | Status: DC | PRN
  Administered 2015-09-25 – 2015-09-26 (×2): 0.5 mg via ORAL
  Filled 2015-09-25 (×2): qty 1

## 2015-09-25 MED ORDER — ENSURE ENLIVE PO LIQD
237.0000 mL | Freq: Two times a day (BID) | ORAL | Status: DC
  Administered 2015-09-26: 237 mL via ORAL

## 2015-09-25 MED ORDER — SODIUM BICARBONATE 650 MG PO TABS
325.0000 mg | ORAL_TABLET | Freq: Four times a day (QID) | ORAL | Status: DC
  Administered 2015-09-25 – 2015-09-26 (×4): 325 mg via ORAL
  Filled 2015-09-25 (×4): qty 1

## 2015-09-25 MED ORDER — FLUTICASONE PROPIONATE HFA 110 MCG/ACT IN AERO: 2.0000 | INHALATION_SPRAY | Freq: Two times a day (BID) | RESPIRATORY_TRACT | Status: DC

## 2015-09-25 MED ORDER — CLONIDINE HCL 0.1 MG PO TABS
0.1000 mg | ORAL_TABLET | Freq: Three times a day (TID) | ORAL | Status: DC
  Administered 2015-09-25 – 2015-09-26 (×3): 0.1 mg via ORAL
  Filled 2015-09-25 (×4): qty 1

## 2015-09-25 MED ORDER — VITAMIN D3 25 MCG (1000 UNIT) PO TABS
1000.0000 [IU] | ORAL_TABLET | Freq: Every day | ORAL | Status: DC
  Administered 2015-09-26: 1000 [IU] via ORAL
  Filled 2015-09-25 (×2): qty 1

## 2015-09-25 MED ORDER — FAMOTIDINE 20 MG PO TABS
20.0000 mg | ORAL_TABLET | Freq: Two times a day (BID) | ORAL | Status: DC
  Administered 2015-09-26: 20 mg via ORAL
  Filled 2015-09-25: qty 1

## 2015-09-25 MED ORDER — ASPIRIN EC 81 MG PO TBEC
81.0000 mg | DELAYED_RELEASE_TABLET | Freq: Every evening | ORAL | Status: DC
  Administered 2015-09-26: 81 mg via ORAL
  Filled 2015-09-25: qty 1

## 2015-09-25 MED ORDER — DOCUSATE SODIUM 100 MG PO CAPS
100.0000 mg | ORAL_CAPSULE | Freq: Two times a day (BID) | ORAL | Status: DC
  Administered 2015-09-25 – 2015-09-26 (×2): 100 mg via ORAL
  Filled 2015-09-25 (×2): qty 1

## 2015-09-25 MED ORDER — FERROUS SULFATE 325 (65 FE) MG PO TABS
325.0000 mg | ORAL_TABLET | Freq: Two times a day (BID) | ORAL | Status: DC
  Administered 2015-09-25 – 2015-09-26 (×2): 325 mg via ORAL
  Filled 2015-09-25 (×2): qty 1

## 2015-09-25 MED ORDER — CLOPIDOGREL BISULFATE 75 MG PO TABS
75.0000 mg | ORAL_TABLET | Freq: Every day | ORAL | Status: DC
  Administered 2015-09-26: 75 mg via ORAL
  Filled 2015-09-25: qty 1

## 2015-09-25 MED ORDER — SODIUM CHLORIDE 0.9% FLUSH
3.0000 mL | Freq: Two times a day (BID) | INTRAVENOUS | Status: DC
  Administered 2015-09-25 – 2015-09-26 (×2): 3 mL via INTRAVENOUS

## 2015-09-25 MED ORDER — ALBUTEROL SULFATE HFA 108 (90 BASE) MCG/ACT IN AERS: 1.0000 | INHALATION_SPRAY | RESPIRATORY_TRACT | Status: DC | PRN

## 2015-09-25 MED ORDER — ALBUTEROL SULFATE (2.5 MG/3ML) 0.083% IN NEBU
2.5000 mg | INHALATION_SOLUTION | RESPIRATORY_TRACT | Status: DC | PRN
  Administered 2015-09-26: 2.5 mg via RESPIRATORY_TRACT
  Filled 2015-09-25: qty 3

## 2015-09-25 NOTE — Progress Notes (Signed)
Patient refused BP med. Dr. Elisabeth PigeonVachhani notified of patient's current BP 163/51 HR 78. No new orders. Will continue to monitor. Rudean Haskellonnisha R Jani Moronta

## 2015-09-25 NOTE — Progress Notes (Signed)
Patient wants to take her own home meds.    Called pharmacy and was told per hospital policy that we do not allow patients to take their own medications from home.   Paged Dr. Elisabeth PigeonVachhani and he stated he would reduce her medication list to only very necessary medications that she needs to take while in hospital on observation.  Patient took all home medications prior to coming to hospital today.

## 2015-09-25 NOTE — ED Provider Notes (Signed)
[facility] Emergency Department Provider Note    ____________________________________________  Time seen: On EMS arrival  I have reviewed the triage vital signs and the nursing notes.   HISTORY  Chief Complaint Loss of Consciousness and Hypotension   History limited by: Not Limited   HPI Alyssa Landry is a 82 y.o. female who presents to the emergency department today because of  syncopal episodes.Per EMS the patient was with her daughter. She was sitting down when she passed out. She passed out twice. Initial blood pressure was systolic in the 50s. Patient is on multiple blood pressure medications and did have a nitroglycerin patch. EMS remove the nitroglycerin patch and gave the patient a small fluid bolus and put her in Trendelenburg. Her pressures did improve. Patient herself stated she just felt a little dizzy and noticed some associated blurred vision. She denies any associated chest pain or shortness of breath. She states she has passed out in the past although that was a number of years ago. She denies any recent fevers. States she was recently treated for urinary tract infection however saw her urologist yesterday and feels cured of that at this point.     Past Medical History  Diagnosis Date  . Hypertension   . COPD (chronic obstructive pulmonary disease) (HCC)   . TIA (transient ischemic attack)   . Acid reflux   . Arthritis   . Diabetes (HCC)   . Hyperlipidemia   . Anxiety   . Hypothyroidism   . Hiatal hernia   . Asthma   . Incontinence     There are no active problems to display for this patient.   Past Surgical History  Procedure Laterality Date  . Abdominal hysterectomy  1966    Current Outpatient Rx  Name  Route  Sig  Dispense  Refill  . albuterol (PROVENTIL HFA;VENTOLIN HFA) 108 (90 BASE) MCG/ACT inhaler   Inhalation   Inhale 1-2 puffs into the lungs every 4 (four) hours as needed for wheezing or shortness of breath.          Marland Kitchen. amLODipine (NORVASC) 10 MG tablet   Oral   Take 10 mg by mouth daily.         Marland Kitchen. aspirin EC 81 MG tablet   Oral   Take 81 mg by mouth every evening.          . cholecalciferol (VITAMIN D) 1000 UNITS tablet   Oral   Take 1,000 Units by mouth daily.         . cloNIDine (CATAPRES) 0.1 MG tablet   Oral   Take 0.1 mg by mouth 3 (three) times daily.         . clopidogrel (PLAVIX) 75 MG tablet   Oral   Take 75 mg by mouth daily.         Marland Kitchen. docusate sodium (COLACE) 100 MG capsule   Oral   Take 100 mg by mouth 2 (two) times daily.          . ferrous sulfate 325 (65 FE) MG tablet   Oral   Take 325 mg by mouth 2 (two) times daily.         . fluticasone (FLONASE) 50 MCG/ACT nasal spray   Each Nare   Place 2 sprays into both nostrils daily.          . fluticasone (FLOVENT HFA) 110 MCG/ACT inhaler   Inhalation   Inhale 2 puffs into the lungs 2 (two) times daily.         .Marland Kitchen  levothyroxine (SYNTHROID, LEVOTHROID) 100 MCG tablet   Oral   Take 100 mcg by mouth daily before breakfast.         . LORazepam (ATIVAN) 0.5 MG tablet   Oral   Take 0.5 mg by mouth 3 (three) times daily as needed for anxiety.         Marland Kitchen losartan (COZAAR) 50 MG tablet   Oral   Take 50 mg by mouth daily.         . Multiple Vitamin (MULTIVITAMIN WITH MINERALS) TABS tablet   Oral   Take 1 tablet by mouth daily.         . nitroGLYCERIN (NITRODUR - DOSED IN MG/24 HR) 0.6 mg/hr patch   Transdermal   Place 0.6 mg onto the skin daily.         . Nutritional Supplements (ENSURE PO)   Oral   Take by mouth. Liquid form         . Oxybutynin 3 (28) % (MG/ACT) GEL   Transdermal   Place 1 application onto the skin at bedtime. Reported on 09/24/2015         . Oxybutynin Chloride 10 % GEL   Transdermal   Place 1 application onto the skin daily.   90 g   3   . polyethylene glycol (MIRALAX / GLYCOLAX) packet   Oral   Take 17 g by mouth daily.         . ranitidine  (ZANTAC) 150 MG tablet   Oral   Take 150 mg by mouth 2 (two) times daily before a meal.          . sodium bicarbonate 325 MG tablet   Oral   Take 325 mg by mouth 4 (four) times daily.         Marland Kitchen tiotropium (SPIRIVA) 18 MCG inhalation capsule   Inhalation   Place 18 mcg into inhaler and inhale daily.         . vitamin B-12 (CYANOCOBALAMIN) 1000 MCG tablet   Oral   Take 1,000 mcg by mouth daily.           Allergies Codeine; Ditropan; Hctz; Metronidazole; Propranolol; Tavist; Tramadol; and Vicodin  Family History  Problem Relation Age of Onset  . Leukemia Mother   . Heart disease Father   . Hypertension      Social History Social History  Substance Use Topics  . Smoking status: Never Smoker   . Smokeless tobacco: None  . Alcohol Use: No    Review of Systems  Constitutional: Negative for fever. Cardiovascular: Negative for chest pain. Respiratory: Negative for shortness of breath. Gastrointestinal: Negative for abdominal pain, vomiting and diarrhea. Neurological: Negative for headaches, focal weakness or numbness.   10-point ROS otherwise negative.  ____________________________________________   PHYSICAL EXAM:  VITAL SIGNS: ED Triage Vitals  Enc Vitals Group     BP 09/25/15 1223 178/71 mmHg     Pulse Rate 09/25/15 1223 84     Resp 09/25/15 1223 20     Temp 09/25/15 1223 97.7 F (36.5 C)     Temp Source 09/25/15 1223 Oral     SpO2 09/25/15 1223 93 %     Weight 09/25/15 1223 114 lb 10.2 oz (52 kg)     Height 09/25/15 1223 5' (1.524 m)   Constitutional: Alert and oriented. Well appearing and in no distress. Eyes: Conjunctivae are normal. PERRL. Normal extraocular movements. ENT   Head: Normocephalic and atraumatic.   Nose: No congestion/rhinnorhea.  Mouth/Throat: Mucous membranes are moist.   Neck: No stridor. Hematological/Lymphatic/Immunilogical: No cervical lymphadenopathy. Cardiovascular: Normal rate, regular rhythm.  No  murmurs, rubs, or gallops. Respiratory: Normal respiratory effort without tachypnea nor retractions. Breath sounds are clear and equal bilaterally. No wheezes/rales/rhonchi. Gastrointestinal: Soft and nontender. No distention.  Genitourinary: Deferred Musculoskeletal: Normal range of motion in all extremities. No joint effusions.  No lower extremity tenderness nor edema. Neurologic:  Normal speech and language. No gross focal neurologic deficits are appreciated.  Skin:  Skin is warm, dry and intact. No rash noted. Psychiatric: Mood and affect are normal. Speech and behavior are normal. Patient exhibits appropriate insight and judgment.  ____________________________________________    LABS (pertinent positives/negatives)  Labs Reviewed  BASIC METABOLIC PANEL - Abnormal; Notable for the following:    Sodium 129 (*)    Chloride 93 (*)    All other components within normal limits  CBC - Abnormal; Notable for the following:    Hemoglobin 11.7 (*)    HCT 34.1 (*)    All other components within normal limits  URINALYSIS COMPLETEWITH MICROSCOPIC ([facility] ONLY) - Abnormal; Notable for the following:    Color, Urine YELLOW (*)    APPearance CLOUDY (*)    All other components within normal limits  TROPONIN I  CBG MONITORING, ED     ____________________________________________   EKG  I, Phineas Semen, attending physician, personally viewed and interpreted this EKG  EKG Time: 1223 Rate: 84 Rhythm: normal sinus rhythm Axis: normal Intervals: qtc 451 QRS: RBBB, LAFB, q waves V1, V2, V3 ST changes: no st elevation Impression: abnormal ekg   ____________________________________________    RADIOLOGY  None   ____________________________________________   PROCEDURES  Procedure(s) performed: None  Critical Care performed: No  ____________________________________________   INITIAL IMPRESSION / ASSESSMENT AND PLAN / ED COURSE  Pertinent labs & imaging results that were  available during my care of the patient were reviewed by me and considered in my medical decision making (see chart for details).  Patient presented to the emergency department today for 2 syncopal episodes. Per EMS report patient was quite hypertensive at that time. Patient normotensive here in the emergency department and appears well. Blood work without any overly concerning findings. Sodium was a little low however do not think this is likely etiology given chronic nature. We will however plan on admission to the hospitalist service for further workup and evaluation.  ____________________________________________   FINAL CLINICAL IMPRESSION(S) / ED DIAGNOSES  Final diagnoses:  Syncope, unspecified syncope type     Phineas Semen, MD 09/25/15 1549

## 2015-09-25 NOTE — H&P (Signed)
[facility] [place] [facility]   PATIENT NAME: Alyssa Landry    MR#:  [id number]  DATE OF BIRTH:  Sep 12, 1918  DATE OF ADMISSION:  09/25/2015  PRIMARY CARE PHYSICIAN: Rafael BihariWALKER III, JOHN B, MD   REQUESTING/REFERRING PHYSICIAN: Derrill KayGoodman  CHIEF COMPLAINT:   Chief Complaint  Patient presents with  . Loss of Consciousness  . Hypotension    HISTORY OF PRESENT ILLNESS: Alyssa Landry  is a 82 y.o. female with a known history of hypertension, COPD, TIA, diabetes, hyperlipidemia, hypothyroidism, asthma- lives very active life and walks with a walker, her daughter lives nearby. She was at her daughter's home today morning took all her medications on time and around 11:00 when she was sitting in the chair and talking to her daughter she suddenly complained backward and passed out for approximately 1 minute daughter was there to try to wake her up but she was not responding at that time. She did not notice any abnormal jerky movements or incontinence of the stool. Patient woke up completely on her own and was completely alert and oriented and without any focal deficit or headache or chest pain. Certainly continue doing the work up but after a few minutes she again passed out the same day while sitting in the chair and this time she was not responsive for almost 10-15 minutes. So daughter pressed her life alert button on and called EMS. When the EMT arrived she was alert and oriented completely, they checked her blood pressure and it was running low one time they noted 80/something, and next 50 / something, as they checked her she had nitroglycerin patch on on her arms so they removed that and brought her to emergency room. In ER she was given some IV fluid and her blood pressure was normal. Because of this episode happened twice the ER physician suggested to admit to medical team for further management.  Patient denies any symptoms of cough fever or chills, no urinary symptoms,  no jerking movements while this episode, no chest pain or focal weakness.  PAST MEDICAL HISTORY:   Past Medical History  Diagnosis Date  . Hypertension   . COPD (chronic obstructive pulmonary disease) (HCC)   . TIA (transient ischemic attack)   . Acid reflux   . Arthritis   . Diabetes (HCC)   . Hyperlipidemia   . Anxiety   . Hypothyroidism   . Hiatal hernia   . Asthma   . Incontinence     PAST SURGICAL HISTORY: Past Surgical History  Procedure Laterality Date  . Abdominal hysterectomy  1966    SOCIAL HISTORY:  Social History  Substance Use Topics  . Smoking status: Never Smoker   . Smokeless tobacco: Not on file  . Alcohol Use: No    FAMILY HISTORY:  Family History  Problem Relation Age of Onset  . Leukemia Mother   . Heart disease Father   . Hypertension      DRUG ALLERGIES:  Allergies  Allergen Reactions  . Codeine Other (See Comments)    Reaction:  Unknown   . Ditropan [Oxybutynin] Other (See Comments)    Reaction:  Unknown   . Hctz [Hydrochlorothiazide] Other (See Comments)    Reaction:  Causes pts sodium/potassium levels to drop significantly   . Metronidazole Other (See Comments)    Reaction:  Unknown   . Propranolol Other (See Comments)    Reaction:  Unknown   . Tavist [Clemastine] Other (See Comments)    Reaction:  Unknown   . Tramadol Other (See Comments)    Reaction:  Unknown   . Vicodin [Hydrocodone-Acetaminophen] Other (See Comments)    Reaction:  Unknown     REVIEW OF SYSTEMS:   CONSTITUTIONAL: No fever, fatigue or weakness.  EYES: No blurred or double vision.  EARS, NOSE, AND THROAT: No tinnitus or ear pain.  RESPIRATORY: No cough, shortness of breath, wheezing or hemoptysis.  CARDIOVASCULAR: No chest pain, orthopnea, edema.  GASTROINTESTINAL: No nausea, vomiting, diarrhea or abdominal pain.  GENITOURINARY: No dysuria, hematuria.  ENDOCRINE: No polyuria, nocturia,  HEMATOLOGY: No anemia, easy bruising or bleeding SKIN: No rash or  lesion. MUSCULOSKELETAL: No joint pain or arthritis.   NEUROLOGIC: No tingling, numbness, weakness. Had syncopal episode. PSYCHIATRY: No anxiety or depression.   MEDICATIONS AT HOME:  Prior to Admission medications   Medication Sig Start Date End Date Taking? Authorizing Provider  albuterol (PROVENTIL HFA;VENTOLIN HFA) 108 (90 BASE) MCG/ACT inhaler Inhale 1-2 puffs into the lungs every 4 (four) hours as needed for wheezing or shortness of breath.   Yes Historical Provider, MD  amLODipine (NORVASC) 10 MG tablet Take 10 mg by mouth daily.   Yes Historical Provider, MD  aspirin EC 81 MG tablet Take 81 mg by mouth every evening.    Yes Historical Provider, MD  cholecalciferol (VITAMIN D) 1000 UNITS tablet Take 1,000 Units by mouth daily.   Yes Historical Provider, MD  cloNIDine (CATAPRES) 0.1 MG tablet Take 0.1 mg by mouth 3 (three) times daily.   Yes Historical Provider, MD  clopidogrel (PLAVIX) 75 MG tablet Take 75 mg by mouth daily.   Yes Historical Provider, MD  docusate sodium (COLACE) 100 MG capsule Take 100 mg by mouth 2 (two) times daily.    Yes Historical Provider, MD  feeding supplement, ENSURE ENLIVE, (ENSURE ENLIVE) LIQD Take 237 mLs by mouth 2 (two) times daily between meals.   Yes Historical Provider, MD  ferrous sulfate 325 (65 FE) MG tablet Take 325 mg by mouth 2 (two) times daily.   Yes Historical Provider, MD  fluticasone (FLONASE) 50 MCG/ACT nasal spray Place 2 sprays into both nostrils daily.    Yes Historical Provider, MD  fluticasone (FLOVENT HFA) 110 MCG/ACT inhaler Inhale 2 puffs into the lungs 2 (two) times daily.   Yes Historical Provider, MD  levothyroxine (SYNTHROID, LEVOTHROID) 100 MCG tablet Take 100 mcg by mouth daily before breakfast.   Yes Historical Provider, MD  LORazepam (ATIVAN) 0.5 MG tablet Take 0.5 mg by mouth 3 (three) times daily as needed for anxiety.   Yes Historical Provider, MD  losartan (COZAAR) 50 MG tablet Take 50 mg by mouth daily.   Yes Historical  Provider, MD  Multiple Vitamin (MULTIVITAMIN WITH MINERALS) TABS tablet Take 1 tablet by mouth daily.   Yes Historical Provider, MD  nitroGLYCERIN (NITRODUR - DOSED IN MG/24 HR) 0.6 mg/hr patch Place 0.6 mg onto the skin daily.   Yes Historical Provider, MD  Oxybutynin Chloride 10 % GEL Place 1 application onto the skin at bedtime.   Yes Historical Provider, MD  polyethylene glycol (MIRALAX / GLYCOLAX) packet Take 17 g by mouth daily.   Yes Historical Provider, MD  ranitidine (ZANTAC) 150 MG tablet Take 150 mg by mouth 2 (two) times daily before a meal.    Yes Historical Provider, MD  sodium bicarbonate 325 MG tablet Take 325 mg by mouth 4 (four) times daily.   Yes Historical Provider, MD  tiotropium (SPIRIVA) 18 MCG inhalation capsule Place 18  mcg into inhaler and inhale daily.   Yes Historical Provider, MD  vitamin B-12 (CYANOCOBALAMIN) 1000 MCG tablet Take 1,000 mcg by mouth daily.   Yes Historical Provider, MD      PHYSICAL EXAMINATION:   VITAL SIGNS: Blood pressure 178/71, pulse 84, temperature 97.7 F (36.5 C), temperature source Oral, resp. rate 20, height 5' (1.524 m), weight 52 kg (114 lb 10.2 oz), SpO2 93 %.  GENERAL:  82 y.o.-year-old patient lying in the bed with no acute distress.  EYES: Pupils equal, round, reactive to light and accommodation. No scleral icterus. Extraocular muscles intact.  HEENT: Head atraumatic, normocephalic. Oropharynx and nasopharynx clear.  NECK:  Supple, no jugular venous distention. No thyroid enlargement, no tenderness.  LUNGS: Normal breath sounds bilaterally, no wheezing, rales,rhonchi or crepitation. No use of accessory muscles of respiration.  CARDIOVASCULAR: S1, S2 normal. No murmurs, rubs, or gallops.  ABDOMEN: Soft, nontender, nondistended. Bowel sounds present. No organomegaly or mass.  EXTREMITIES: No pedal edema, cyanosis, or clubbing.  NEUROLOGIC: Cranial nerves II through XII are intact. Muscle strength 5/5 in all extremities. Sensation  intact. Gait not checked.  PSYCHIATRIC: The patient is alert and oriented x 3.  SKIN: No obvious rash, lesion, or ulcer.   LABORATORY PANEL:   CBC  Recent Labs Lab 09/25/15 1222  WBC 7.6  HGB 11.7*  HCT 34.1*  PLT 303  MCV 87.7  MCH 30.0  MCHC 34.2  RDW 13.0   ------------------------------------------------------------------------------------------------------------------  Chemistries   Recent Labs Lab 09/25/15 1222  NA 129*  K 3.9  CL 93*  CO2 28  GLUCOSE 98  BUN 16  CREATININE 0.64  CALCIUM 8.9   ------------------------------------------------------------------------------------------------------------------ estimated creatinine clearance is 30.9 mL/min (by C-G formula based on Cr of 0.64). ------------------------------------------------------------------------------------------------------------------ No results for input(s): TSH, T4TOTAL, T3FREE, THYROIDAB in the last 72 hours.  Invalid input(s): FREET3   Coagulation profile No results for input(s): INR, PROTIME in the last 168 hours. ------------------------------------------------------------------------------------------------------------------- No results for input(s): DDIMER in the last 72 hours. -------------------------------------------------------------------------------------------------------------------  Cardiac Enzymes  Recent Labs Lab 09/25/15 1222  TROPONINI <0.03   ------------------------------------------------------------------------------------------------------------------ Invalid input(s): POCBNP  ---------------------------------------------------------------------------------------------------------------  Urinalysis    Component Value Date/Time   COLORURINE YELLOW* 09/25/2015 1311   COLORURINE Yellow 08/11/2012 1854   APPEARANCEUR CLOUDY* 09/25/2015 1311   APPEARANCEUR Cloudy 08/11/2012 1854   LABSPEC 1.006 09/25/2015 1311   LABSPEC 1.012 08/11/2012 1854   PHURINE  8.0 09/25/2015 1311   PHURINE 6.0 08/11/2012 1854   GLUCOSEU NEGATIVE 09/25/2015 1311   GLUCOSEU Negative 08/11/2012 1854   HGBUR NEGATIVE 09/25/2015 1311   HGBUR Negative 08/11/2012 1854   BILIRUBINUR NEGATIVE 09/25/2015 1311   BILIRUBINUR Negative 08/11/2012 1854   KETONESUR NEGATIVE 09/25/2015 1311   KETONESUR Negative 08/11/2012 1854   PROTEINUR NEGATIVE 09/25/2015 1311   PROTEINUR Negative 08/11/2012 1854   NITRITE NEGATIVE 09/25/2015 1311   NITRITE Negative 08/11/2012 1854   LEUKOCYTESUR NEGATIVE 09/25/2015 1311   LEUKOCYTESUR 2+ 08/11/2012 1854     RADIOLOGY: No results found.  EKG: Orders placed or performed during the hospital encounter of 09/25/15  . ED EKG  . ED EKG  . EKG 12-Lead  . EKG 12-Lead   NSR.  IMPRESSION AND PLAN:  * Syncopal episode  This happened twice at home, witnessed by her daughter.   As per daughter when EMS arrived her blood pressure was running low.  I will currently hold her antihypertensive medications and monitor her on cardiac monitoring, with echocardiogram.  As patient had complete recovery  immediately following that episode, without any confusion, poor focal weakness or headache or any jerky movements, I do not think this is a neurological origin.   I would target to keep patient's blood pressure goal of 140-150 systolic and not to target very tight blood pressure control.  * Hypertension  Currently hold all hypertensive blood pressure medications.  * COPD  No signs of wheezing or exacerbation episode, continue her inhalers.  * History of stroke  Continue present medications including aspirin and Plavix.  All the records are reviewed and case discussed with ED provider. Management plans discussed with the patient, family and they are in agreement.  CODE STATUS: DO NOT RESUSCITATE Code Status History    This patient does not have a recorded code status. Please follow your organizational policy for patients in this situation.      Patient's daughter was present in the room at the time of admission, discussed with her.  TOTAL TIME TAKING CARE OF THIS PATIENT: 50 minutes.    Altamese Dilling M.D on 09/25/2015   Between 7am to 6pm - Pager - [phone]  After 6pm go to [email or web address] - password [email or web address]  [place] [facility]  Office  [phone]  CC: Primary care physician; Rafael Bihari, MD   Note: This dictation was prepared with Dragon dictation along with smaller phrase technology. Any transcriptional errors that result from this process are unintentional.

## 2015-09-25 NOTE — ED Notes (Addendum)
Pt arrives to ER via [facility] c/o syncope X 2 today while sitting in chair both times. Pt hypotensive per FD VS and EMS VS. CBG checked and EMS states that it was normal but unable to give specific number. Pt lives with daughter. Pt alert and oriented X4, active, cooperative, pt in NAD. RR even and unlabored, color WNL.  MD at bedside. 20G to LAC initiated by EMS and approx 100cc NS administered en route.

## 2015-09-26 ENCOUNTER — Observation Stay: Payer: Medicare Other

## 2015-09-26 DIAGNOSIS — R55 Syncope and collapse: Secondary | ICD-10-CM | POA: Diagnosis not present

## 2015-09-26 DIAGNOSIS — L280 Lichen simplex chronicus: Secondary | ICD-10-CM | POA: Insufficient documentation

## 2015-09-26 DIAGNOSIS — N3941 Urge incontinence: Secondary | ICD-10-CM | POA: Insufficient documentation

## 2015-09-26 LAB — CBC
HCT: 29.5 % — ABNORMAL LOW (ref 35.0–47.0)
Hemoglobin: 10.3 g/dL — ABNORMAL LOW (ref 12.0–16.0)
MCH: 30.5 pg (ref 26.0–34.0)
MCHC: 35 g/dL (ref 32.0–36.0)
MCV: 87.1 fL (ref 80.0–100.0)
PLATELETS: 270 10*3/uL (ref 150–440)
RBC: 3.39 MIL/uL — AB (ref 3.80–5.20)
RDW: 12.9 % (ref 11.5–14.5)
WBC: 6 10*3/uL (ref 3.6–11.0)

## 2015-09-26 LAB — BASIC METABOLIC PANEL
Anion gap: 6 (ref 5–15)
BUN: 14 mg/dL (ref 6–20)
CHLORIDE: 96 mmol/L — AB (ref 101–111)
CO2: 26 mmol/L (ref 22–32)
CREATININE: 0.5 mg/dL (ref 0.44–1.00)
Calcium: 8.6 mg/dL — ABNORMAL LOW (ref 8.9–10.3)
GFR calc Af Amer: >60 mL/min (ref >60)
GFR calc non Af Amer: >60 mL/min (ref >60)
GLUCOSE: 91 mg/dL (ref 65–99)
Potassium: 4 mmol/L (ref 3.5–5.1)
Sodium: 128 mmol/L — ABNORMAL LOW (ref 135–145)

## 2015-09-26 LAB — ECHOCARDIOGRAM COMPLETE
Height: 60 in
Weight: 1635.2 oz

## 2015-09-26 MED ORDER — LOSARTAN POTASSIUM 50 MG PO TABS
50.0000 mg | ORAL_TABLET | Freq: Every day | ORAL | Status: DC
  Administered 2015-09-26: 50 mg via ORAL
  Filled 2015-09-26: qty 1

## 2015-09-26 MED ORDER — AMLODIPINE BESYLATE 10 MG PO TABS
10.0000 mg | ORAL_TABLET | Freq: Every day | ORAL | Status: DC
  Administered 2015-09-26: 10 mg via ORAL
  Filled 2015-09-26: qty 1

## 2015-09-26 NOTE — Progress Notes (Addendum)
Nurse called me to tell me the patient is complaining of blurred vision. Due to her high blood pressure we'll order MRI stat to evaluate pressure. Neuro checks every 4 hours.

## 2015-09-26 NOTE — Progress Notes (Signed)
Patient's next appointment is on March 24th. Per Sherrie the scheduler, that's the earliest appointment available.

## 2015-09-26 NOTE — Discharge Summary (Addendum)
[facility] [place] [facility]   PATIENT NAME: Alyssa Landry    MR#:  [id number]  DATE OF BIRTH:  08-20-1918  DATE OF ADMISSION:  09/25/2015 ADMITTING PHYSICIAN: Altamese DillingVaibhavkumar Vachhani, MD  DATE OF DISCHARGE: *09/25/2105  PRIMARY CARE PHYSICIAN: Rafael BihariWALKER III, JOHN B, MD    ADMISSION DIAGNOSIS:  Syncope, unspecified syncope type [R55]  DISCHARGE DIAGNOSIS:  Principal Problem:   Syncopal episodes   SECONDARY DIAGNOSIS:   Past Medical History  Diagnosis Date  . Hypertension   . COPD (chronic obstructive pulmonary disease) (HCC)   . TIA (transient ischemic attack)   . Acid reflux   . Arthritis   .    Marland Kitchen. Hyperlipidemia   . Anxiety   . Hypothyroidism   . Hiatal hernia   . Asthma   . Incontinence     HOSPITAL COURSE:    82 year--old female with history of essential hypertension who presented with syncope. Fr further details please further H&P.   1. Syncope; This is from hypotension which she presented with to the ED. Most of her BP medications were stopped ansd she was asked to check her blood pressure twice a day and LOG it. There were no signs of infection. Her blood pressure improved with IVF and stopping most  of her BP medications. Her ECHO shows normal EF and no major abnormalities.  2/ essential HTN: Please see discharge medications for stopped medications. She wil continue CLONIDINE to avoid rebound and NORVASC.  3. COPD No signs of wheezing or exacerbation episode, continue her inhalers.  4. History of stroke Continue aspirin and Plavix. She was complaining of occasional blurred vision,so I ordered and MRI which was negative for CVA. She can see an Opthomologist if she continues to have blurred vision.   DISCHARGE CONDITIONS AND DIET:   Stable heart healthy   CONSULTS OBTAINED:     DRUG ALLERGIES:   Allergies  Allergen Reactions  . Codeine Other (See Comments)    Reaction:  Unknown   . Ditropan [Oxybutynin] Other  (See Comments)    Reaction:  Unknown   . Hctz [Hydrochlorothiazide] Other (See Comments)    Reaction:  Causes pts sodium/potassium levels to drop significantly   . Metronidazole Other (See Comments)    Reaction:  Unknown   . Propranolol Other (See Comments)    Reaction:  Unknown   . Tavist [Clemastine] Other (See Comments)    Reaction:  Unknown   . Tramadol Other (See Comments)    Reaction:  Unknown   . Vicodin [Hydrocodone-Acetaminophen] Other (See Comments)    Reaction:  Unknown     DISCHARGE MEDICATIONS:   Current Discharge Medication List    CONTINUE these medications which have NOT CHANGED   Details  albuterol (PROVENTIL HFA;VENTOLIN HFA) 108 (90 BASE) MCG/ACT inhaler Inhale 1-2 puffs into the lungs every 4 (four) hours as needed for wheezing or shortness of breath.    amLODipine (NORVASC) 10 MG tablet Take 10 mg by mouth daily.    aspirin EC 81 MG tablet Take 81 mg by mouth every evening.     cholecalciferol (VITAMIN D) 1000 UNITS tablet Take 1,000 Units by mouth daily.    cloNIDine (CATAPRES) 0.1 MG tablet Take 0.1 mg by mouth 3 (three) times daily.    clopidogrel (PLAVIX) 75 MG tablet Take 75 mg by mouth daily.    docusate sodium (COLACE) 100 MG capsule Take 100 mg by mouth 2 (two) times daily.     feeding supplement, ENSURE  ENLIVE, (ENSURE ENLIVE) LIQD Take 237 mLs by mouth 2 (two) times daily between meals.    ferrous sulfate 325 (65 FE) MG tablet Take 325 mg by mouth 2 (two) times daily.    fluticasone (FLONASE) 50 MCG/ACT nasal spray Place 2 sprays into both nostrils daily.     fluticasone (FLOVENT HFA) 110 MCG/ACT inhaler Inhale 2 puffs into the lungs 2 (two) times daily.    levothyroxine (SYNTHROID, LEVOTHROID) 100 MCG tablet Take 100 mcg by mouth daily before breakfast.    LORazepam (ATIVAN) 0.5 MG tablet Take 0.5 mg by mouth 3 (three) times daily as needed for anxiety.    losartan (COZAAR) 50 MG tablet Take 50 mg by mouth daily.    Multiple Vitamin  (MULTIVITAMIN WITH MINERALS) TABS tablet Take 1 tablet by mouth daily.    Oxybutynin Chloride 10 % GEL Place 1 application onto the skin at bedtime.    polyethylene glycol (MIRALAX / GLYCOLAX) packet Take 17 g by mouth daily.    ranitidine (ZANTAC) 150 MG tablet Take 150 mg by mouth 2 (two) times daily before a meal.     sodium bicarbonate 325 MG tablet Take 325 mg by mouth 4 (four) times daily.    tiotropium (SPIRIVA) 18 MCG inhalation capsule Place 18 mcg into inhaler and inhale daily.    vitamin B-12 (CYANOCOBALAMIN) 1000 MCG tablet Take 1,000 mcg by mouth daily.      STOP taking these medications     nitroGLYCERIN (NITRODUR - DOSED IN MG/24 HR) 0.6 mg/hr patch               Today   CHIEF COMPLAINT:  Doing well  No issues over night not orthostatic   VITAL SIGNS:  Blood pressure 163/59, pulse 94, temperature 98.1 F (36.7 C), temperature source Oral, resp. rate 18, height 5' (1.524 m), weight 46.358 kg (102 lb 3.2 oz), SpO2 94 %.   REVIEW OF SYSTEMS:  Review of Systems  Constitutional: Negative for fever, chills and malaise/fatigue.  HENT: Negative for sore throat.   Eyes: Negative for blurred vision.  Respiratory: Negative for cough, hemoptysis, shortness of breath and wheezing.   Cardiovascular: Negative for chest pain, palpitations and leg swelling.  Gastrointestinal: Negative for nausea, vomiting, abdominal pain, diarrhea and blood in stool.  Genitourinary: Negative for dysuria.  Musculoskeletal: Negative for back pain.  Neurological: Negative for dizziness, tremors and headaches.  Endo/Heme/Allergies: Does not bruise/bleed easily.     PHYSICAL EXAMINATION:  GENERAL:  82 y.o.-year-old patient lying in the bed with no acute distress.  NECK:  Supple, no jugular venous distention. No thyroid enlargement, no tenderness.  LUNGS: Normal breath sounds bilaterally, no wheezing, rales,rhonchi  No use of accessory muscles of respiration.  CARDIOVASCULAR: S1,  S2 normal. 2/6 SEM NO rubs, or gallops.  ABDOMEN: Soft, non-tender, non-distended. Bowel sounds present. No organomegaly or mass.  EXTREMITIES: No pedal edema, cyanosis, or clubbing.  PSYCHIATRIC: The patient is alert and oriented x 3.  SKIN: No obvious rash, lesion, or ulcer.   DATA REVIEW:   CBC  Recent Labs Lab 09/26/15 0448  WBC 6.0  HGB 10.3*  HCT 29.5*  PLT 270    Chemistries   Recent Labs Lab 09/26/15 0448  NA 128*  K 4.0  CL 96*  CO2 26  GLUCOSE 91  BUN 14  CREATININE 0.50  CALCIUM 8.6*    Cardiac Enzymes  Recent Labs Lab 09/25/15 1222  TROPONINI <0.03    Microbiology Results  @  RADIOLOGY:  No results found.    Management plans discussed with the patient and she is in agreement. Stable for discharge home  Patient should follow up with PCP   CODE STATUS:     Code Status Orders        Start     Ordered   09/25/15 1618  Do not attempt resuscitation (DNR)   Continuous    Question Answer Comment  In the event of cardiac or respiratory ARREST Do not call a "code blue"   In the event of cardiac or respiratory ARREST Do not perform Intubation, CPR, defibrillation or ACLS   In the event of cardiac or respiratory ARREST Use medication by any route, position, wound care, and other measures to relive pain and suffering. May use oxygen, suction and manual treatment of airway obstruction as needed for comfort.      09/25/15 1617    Code Status History    Date Active Date Inactive Code Status Order ID Comments User Context   This patient has a current code status but no historical code status.      TOTAL TIME TAKING CARE OF THIS PATIENT: 35 minutes.    Note: This dictation was prepared with Dragon dictation along with smaller phrase technology. Any transcriptional errors that result from this process are unintentional.  Rielly Brunn M.D on 09/26/2015 at 2:32 PM  Between 7am to 6pm - Pager - [phone] After 6pm go to  [email or web address] - password [email or web address] [facility]  [facility] [facility]  Office  [phone]  CC: Primary care physician; Rafael Bihari, MD

## 2015-09-26 NOTE — Progress Notes (Signed)
Patient c/o of blurry vision. Notified Dr. Juliene PinaMody. Per MD will order MRI. Will continue to monitor patient. Alyssa Landry

## 2015-09-26 NOTE — Progress Notes (Signed)
MRI negative for CVA D/w family Ok to go home after supper

## 2015-09-26 NOTE — Progress Notes (Signed)
Patient received discharge instructions, pt verbalized understanding. IV was removed with no signs of infection. Dressing clean, dry intact. No skin tears or wounds present. Patient was escorted out with staff member via wheelchair via private auto. No further needs from care management team.  

## 2015-09-26 NOTE — Evaluation (Signed)
Physical Therapy Evaluation Patient Details Name: Alyssa Landry MRN: [id number] DOB: January 30, 1919 Today's Date: 09/26/2015   History of Present Illness  Patient is a 82 y/o female that presents after multiple syncopal episodes at home, found by EMS to have low BPs.  Clinical Impression  Patient is a very pleasant 82 y/o that presents after multiple syncopal episodes at home. No dizziness, faintness, or loss of balance noted in this session. Her vital signs remained stable and WNL throughout ambulation, no loss of balance and appropriate gait mechanics noted. She did demonstrate mild instability, evidenced by drifting to the R with SPC though no overt loss of balance. Patient unable to complete SLS or tandem stance without HHA, thus HEP provided. At this time patient appears at her baseline and does not need further therapy after her hospitalization.     Follow Up Recommendations No PT follow up    Equipment Recommendations       Recommendations for Other Services       Precautions / Restrictions Precautions Precautions: Fall Restrictions Weight Bearing Restrictions: No      Mobility  Bed Mobility Overal bed mobility: Needs Assistance Bed Mobility: Supine to Sit;Sit to Supine     Supine to sit: Modified independent (Device/Increase time);HOB elevated Sit to supine: Modified independent (Device/Increase time);HOB elevated   General bed mobility comments: No deficits noted.  Transfers Overall transfer level: Modified independent Equipment used: Straight cane             General transfer comment: No deficits in sit to stand. Able to complete 5x sit to stand without use of hands in 16 seconds.   Ambulation/Gait Ambulation/Gait assistance: Supervision Ambulation Distance (Feet): 200 Feet Assistive device: Straight cane Gait Pattern/deviations: WFL(Within Functional Limits)   Gait velocity interpretation: Below normal speed for age/gender General Gait Details:  Modified DGI of 11/12,reciprocal gait pattern with mild drifting noted though loss of balance observed.   Stairs            Wheelchair Mobility    Modified Rankin (Stroke Patients Only)       Balance Overall balance assessment: Modified Independent                                           Pertinent Vitals/Pain Pain Assessment: No/denies pain    Home Living                        Prior Function                 Hand Dominance        Extremity/Trunk Assessment   Upper Extremity Assessment: Overall WFL for tasks assessed           Lower Extremity Assessment: Overall WFL for tasks assessed         Communication      Cognition Arousal/Alertness: Awake/alert Behavior During Therapy: WFL for tasks assessed/performed Overall Cognitive Status: Within Functional Limits for tasks assessed                      General Comments      Exercises Other Exercises Other Exercises: 5x sit to stand wihtout use of hands in 16 seconds.  Other Exercises: SLS unable to perform with out HHA, completed with 5" holds x 8 repetitions and HHA bilaterally.  Other Exercises: Tandem stance  x 10" holds bilaterally for 3 attempts with HHA (unable to complete without HHA.       Assessment/Plan    PT Assessment Patient needs continued PT services  PT Diagnosis Difficulty walking   PT Problem List Decreased strength;Decreased balance  PT Treatment Interventions Gait training;DME instruction;Stair training;Therapeutic activities;Therapeutic exercise;Balance training   PT Goals (Current goals can be found in the Care Plan section) Acute Rehab PT Goals Patient Stated Goal: To return home safely PT Goal Formulation: With patient Time For Goal Achievement: 10/10/15 Potential to Achieve Goals: Good Additional Goals Additional Goal #1:  (Pt will score > 50/56 on BBS to demonstrate low falls risk)    Frequency Min 2X/week   Barriers to  discharge        Co-evaluation               End of Session Equipment Utilized During Treatment: Gait belt Activity Tolerance: Patient tolerated treatment well Patient left: in bed;with call bell/phone within reach;with chair alarm set;with family/visitor present Nurse Communication: Mobility status         Time: 1000-1025 PT Time Calculation (min) (ACUTE ONLY): 25 min   Charges:   PT Evaluation $PT Eval Moderate Complexity: 1 Procedure PT Treatments $Therapeutic Exercise: 8-22 mins   PT G Codes:       Kerin Ransom, PT, DPT    09/26/2015, 2:15 PM

## 2015-09-26 NOTE — Care Management Obs Status (Signed)
MEDICARE OBSERVATION STATUS NOTIFICATION   Patient Details  Name: Alyssa Landry MRN: [id number] Date of Birth: [birth year]   Medicare Observation Status Notification Given:  Yes    Eber HongGreene, Trellis Vanoverbeke R, RN 09/26/2015, 10:16 AM

## 2015-09-27 NOTE — Progress Notes (Signed)
   09/26/15 1210  PT Time Calculation  PT Start Time (ACUTE ONLY) 1000  PT Stop Time (ACUTE ONLY) 1025  PT Time Calculation (min) (ACUTE ONLY) 25 min  PT G-Codes **NOT FOR INPATIENT CLASS**  Functional Assessment Tool Used clinical judgement  Functional Limitation Mobility: Walking and moving around  Mobility: Walking and Moving Around Current Status (Z6109(G8978) CI  Mobility: Walking and Moving Around Goal Status (U0454(G8979) CI  Mobility: Walking and Moving Around Discharge Status (U9811(G8980) CI  PT General Charges  $$ ACUTE PT VISIT 1 Procedure  PT Evaluation  $PT Eval Moderate Complexity 1 Procedure  PT Treatments  $Therapeutic Exercise 8-22 mins   Late-entry G-codes entered after discipline-specific review of documentation and clinical findings. Shalan Neault H. Manson PasseyBrown, PT, DPT, NCS 09/27/2015, 9:00 AM [phone]

## 2015-12-11 ENCOUNTER — Emergency Department: Payer: Medicare Other

## 2015-12-11 ENCOUNTER — Inpatient Hospital Stay
Admission: EM | Admit: 2015-12-11 | Discharge: 2015-12-15 | DRG: 189 | Disposition: A | Discharge status: Home / Self Care | Payer: Medicare Other | Attending: Internal Medicine | Admitting: Internal Medicine

## 2015-12-11 ENCOUNTER — Encounter: Payer: Self-pay | Admitting: *Deleted

## 2015-12-11 DIAGNOSIS — Z7982 Long term (current) use of aspirin: Secondary | ICD-10-CM | POA: Diagnosis not present

## 2015-12-11 DIAGNOSIS — F419 Anxiety disorder, unspecified: Secondary | ICD-10-CM | POA: Diagnosis present

## 2015-12-11 DIAGNOSIS — Z7951 Long term (current) use of inhaled steroids: Secondary | ICD-10-CM

## 2015-12-11 DIAGNOSIS — J8410 Pulmonary fibrosis, unspecified: Secondary | ICD-10-CM | POA: Diagnosis present

## 2015-12-11 DIAGNOSIS — Z886 Allergy status to analgesic agent status: Secondary | ICD-10-CM | POA: Diagnosis not present

## 2015-12-11 DIAGNOSIS — J45901 Unspecified asthma with (acute) exacerbation: Secondary | ICD-10-CM | POA: Diagnosis present

## 2015-12-11 DIAGNOSIS — M1990 Unspecified osteoarthritis, unspecified site: Secondary | ICD-10-CM | POA: Diagnosis present

## 2015-12-11 DIAGNOSIS — E871 Hypo-osmolality and hyponatremia: Secondary | ICD-10-CM | POA: Diagnosis present

## 2015-12-11 DIAGNOSIS — Z79899 Other long term (current) drug therapy: Secondary | ICD-10-CM | POA: Diagnosis not present

## 2015-12-11 DIAGNOSIS — E039 Hypothyroidism, unspecified: Secondary | ICD-10-CM | POA: Diagnosis present

## 2015-12-11 DIAGNOSIS — I10 Essential (primary) hypertension: Secondary | ICD-10-CM | POA: Diagnosis present

## 2015-12-11 DIAGNOSIS — D649 Anemia, unspecified: Secondary | ICD-10-CM | POA: Diagnosis present

## 2015-12-11 DIAGNOSIS — E222 Syndrome of inappropriate secretion of antidiuretic hormone: Secondary | ICD-10-CM | POA: Diagnosis present

## 2015-12-11 DIAGNOSIS — J441 Chronic obstructive pulmonary disease with (acute) exacerbation: Secondary | ICD-10-CM | POA: Diagnosis present

## 2015-12-11 DIAGNOSIS — Z806 Family history of leukemia: Secondary | ICD-10-CM

## 2015-12-11 DIAGNOSIS — J189 Pneumonia, unspecified organism: Secondary | ICD-10-CM

## 2015-12-11 DIAGNOSIS — I4891 Unspecified atrial fibrillation: Secondary | ICD-10-CM | POA: Diagnosis present

## 2015-12-11 DIAGNOSIS — Z8673 Personal history of transient ischemic attack (TIA), and cerebral infarction without residual deficits: Secondary | ICD-10-CM

## 2015-12-11 DIAGNOSIS — Z8249 Family history of ischemic heart disease and other diseases of the circulatory system: Secondary | ICD-10-CM

## 2015-12-11 DIAGNOSIS — J9621 Acute and chronic respiratory failure with hypoxia: Secondary | ICD-10-CM | POA: Diagnosis present

## 2015-12-11 DIAGNOSIS — Z90710 Acquired absence of both cervix and uterus: Secondary | ICD-10-CM | POA: Diagnosis not present

## 2015-12-11 DIAGNOSIS — I480 Paroxysmal atrial fibrillation: Secondary | ICD-10-CM | POA: Diagnosis present

## 2015-12-11 DIAGNOSIS — M810 Age-related osteoporosis without current pathological fracture: Secondary | ICD-10-CM | POA: Diagnosis present

## 2015-12-11 DIAGNOSIS — Z66 Do not resuscitate: Secondary | ICD-10-CM | POA: Diagnosis present

## 2015-12-11 DIAGNOSIS — Z888 Allergy status to other drugs, medicaments and biological substances status: Secondary | ICD-10-CM | POA: Diagnosis not present

## 2015-12-11 DIAGNOSIS — R0600 Dyspnea, unspecified: Secondary | ICD-10-CM

## 2015-12-11 DIAGNOSIS — K219 Gastro-esophageal reflux disease without esophagitis: Secondary | ICD-10-CM | POA: Diagnosis present

## 2015-12-11 DIAGNOSIS — E119 Type 2 diabetes mellitus without complications: Secondary | ICD-10-CM | POA: Diagnosis present

## 2015-12-11 HISTORY — DX: Cough: R05

## 2015-12-11 HISTORY — DX: Gastro-esophageal reflux disease without esophagitis: K21.9

## 2015-12-11 HISTORY — DX: Hypo-osmolality and hyponatremia: E87.1

## 2015-12-11 HISTORY — DX: Unspecified atrial fibrillation: I48.91

## 2015-12-11 HISTORY — DX: Syndrome of inappropriate secretion of antidiuretic hormone: E22.2

## 2015-12-11 HISTORY — DX: Chronic cough: R05.3

## 2015-12-11 HISTORY — DX: Age-related osteoporosis without current pathological fracture: M81.0

## 2015-12-11 HISTORY — DX: Anemia, unspecified: D64.9

## 2015-12-11 HISTORY — DX: Pulmonary fibrosis, unspecified: J84.10

## 2015-12-11 HISTORY — DX: Migraine, unspecified, not intractable, without status migrainosus: G43.909

## 2015-12-11 LAB — BASIC METABOLIC PANEL
ANION GAP: 11 (ref 5–15)
BUN: 13 mg/dL (ref 6–20)
CHLORIDE: 83 mmol/L — AB (ref 101–111)
CO2: 23 mmol/L (ref 22–32)
Calcium: 8.5 mg/dL — ABNORMAL LOW (ref 8.9–10.3)
Creatinine, Ser: 0.53 mg/dL (ref 0.44–1.00)
GFR calc Af Amer: >60 mL/min (ref >60)
Glucose, Bld: 192 mg/dL — ABNORMAL HIGH (ref 65–99)
POTASSIUM: 4.6 mmol/L (ref 3.5–5.1)
SODIUM: 117 mmol/L — AB (ref 135–145)

## 2015-12-11 LAB — COMPREHENSIVE METABOLIC PANEL
ALT: 25 U/L (ref 14–54)
AST: 29 U/L (ref 15–41)
Albumin: 3.8 g/dL (ref 3.5–5.0)
Alkaline Phosphatase: 121 U/L (ref 38–126)
Anion gap: 10 (ref 5–15)
BUN: 11 mg/dL (ref 6–20)
CHLORIDE: 81 mmol/L — AB (ref 101–111)
CO2: 27 mmol/L (ref 22–32)
Calcium: 8.8 mg/dL — ABNORMAL LOW (ref 8.9–10.3)
Creatinine, Ser: 0.42 mg/dL — ABNORMAL LOW (ref 0.44–1.00)
Glucose, Bld: 135 mg/dL — ABNORMAL HIGH (ref 65–99)
POTASSIUM: 4.2 mmol/L (ref 3.5–5.1)
SODIUM: 118 mmol/L — AB (ref 135–145)
Total Bilirubin: 0.9 mg/dL (ref 0.3–1.2)
Total Protein: 7.3 g/dL (ref 6.5–8.1)

## 2015-12-11 LAB — CBC
HCT: 32.1 % — ABNORMAL LOW (ref 35.0–47.0)
Hemoglobin: 10.9 g/dL — ABNORMAL LOW (ref 12.0–16.0)
MCH: 29.6 pg (ref 26.0–34.0)
MCHC: 34 g/dL (ref 32.0–36.0)
MCV: 87.1 fL (ref 80.0–100.0)
PLATELETS: 300 10*3/uL (ref 150–440)
RBC: 3.69 MIL/uL — ABNORMAL LOW (ref 3.80–5.20)
RDW: 13.2 % (ref 11.5–14.5)
WBC: 11.2 10*3/uL — AB (ref 3.6–11.0)

## 2015-12-11 LAB — SODIUM, URINE, RANDOM: Sodium, Ur: <10 mmol/L

## 2015-12-11 LAB — GLUCOSE, CAPILLARY: Glucose-Capillary: 210 mg/dL — ABNORMAL HIGH (ref 65–99)

## 2015-12-11 LAB — TROPONIN I

## 2015-12-11 LAB — MRSA PCR SCREENING: MRSA BY PCR: NEGATIVE

## 2015-12-11 LAB — OSMOLALITY, URINE: Osmolality, Ur: 473 mOsm/kg (ref 300–900)

## 2015-12-11 MED ORDER — ALBUTEROL SULFATE (2.5 MG/3ML) 0.083% IN NEBU: 2.5000 mg | INHALATION_SOLUTION | RESPIRATORY_TRACT | Status: DC | PRN

## 2015-12-11 MED ORDER — FLUTICASONE PROPIONATE 50 MCG/ACT NA SUSP
2.0000 | Freq: Every day | NASAL | Status: DC
  Administered 2015-12-12 – 2015-12-15 (×4): 2 via NASAL
  Filled 2015-12-11: qty 16

## 2015-12-11 MED ORDER — IPRATROPIUM-ALBUTEROL 0.5-2.5 (3) MG/3ML IN SOLN
3.0000 mL | Freq: Once | RESPIRATORY_TRACT | Status: AC
  Administered 2015-12-11: 3 mL via RESPIRATORY_TRACT
  Filled 2015-12-11: qty 3

## 2015-12-11 MED ORDER — LEVOFLOXACIN IN D5W 750 MG/150ML IV SOLN
750.0000 mg | Freq: Once | INTRAVENOUS | Status: AC
  Administered 2015-12-11: 750 mg via INTRAVENOUS
  Filled 2015-12-11: qty 150

## 2015-12-11 MED ORDER — ONDANSETRON HCL 4 MG/2ML IJ SOLN
4.0000 mg | Freq: Four times a day (QID) | INTRAMUSCULAR | Status: DC | PRN
  Administered 2015-12-12: 4 mg via INTRAVENOUS
  Filled 2015-12-11: qty 2

## 2015-12-11 MED ORDER — FERROUS SULFATE 325 (65 FE) MG PO TABS
325.0000 mg | ORAL_TABLET | Freq: Two times a day (BID) | ORAL | Status: DC
  Administered 2015-12-12 – 2015-12-15 (×7): 325 mg via ORAL
  Filled 2015-12-11 (×7): qty 1

## 2015-12-11 MED ORDER — LEVALBUTEROL HCL 1.25 MG/0.5ML IN NEBU: 1.2500 mg | INHALATION_SOLUTION | Freq: Four times a day (QID) | RESPIRATORY_TRACT | Status: DC | PRN

## 2015-12-11 MED ORDER — POLYETHYLENE GLYCOL 3350 17 G PO PACK
17.0000 g | PACK | Freq: Every day | ORAL | Status: DC
  Administered 2015-12-12 – 2015-12-15 (×4): 17 g via ORAL
  Filled 2015-12-11 (×4): qty 1

## 2015-12-11 MED ORDER — VITAMIN B-12 1000 MCG PO TABS
1000.0000 ug | ORAL_TABLET | Freq: Every day | ORAL | Status: DC
  Administered 2015-12-12 – 2015-12-15 (×4): 1000 ug via ORAL
  Filled 2015-12-11 (×4): qty 1

## 2015-12-11 MED ORDER — LEVOFLOXACIN IN D5W 250 MG/50ML IV SOLN
250.0000 mg | INTRAVENOUS | Status: DC
Start: 2015-12-12 — End: 2015-12-12
  Filled 2015-12-11: qty 50

## 2015-12-11 MED ORDER — DILTIAZEM HCL 25 MG/5ML IV SOLN: 5.0000 mg | Freq: Four times a day (QID) | INTRAVENOUS | Status: DC | PRN

## 2015-12-11 MED ORDER — AMLODIPINE BESYLATE 10 MG PO TABS
10.0000 mg | ORAL_TABLET | Freq: Every day | ORAL | Status: DC
  Administered 2015-12-12: 10 mg via ORAL
  Filled 2015-12-11: qty 1

## 2015-12-11 MED ORDER — SODIUM CHLORIDE 0.9 % IV SOLN
Freq: Once | INTRAVENOUS | Status: AC
  Administered 2015-12-11: 17:00:00 via INTRAVENOUS

## 2015-12-11 MED ORDER — ASPIRIN EC 81 MG PO TBEC
81.0000 mg | DELAYED_RELEASE_TABLET | Freq: Every evening | ORAL | Status: DC
  Administered 2015-12-11 – 2015-12-14 (×4): 81 mg via ORAL
  Filled 2015-12-11 (×4): qty 1

## 2015-12-11 MED ORDER — BUDESONIDE 0.25 MG/2ML IN SUSP
0.2500 mg | Freq: Two times a day (BID) | RESPIRATORY_TRACT | Status: DC
Start: 2015-12-11 — End: 2015-12-15
  Administered 2015-12-12 – 2015-12-14 (×5): 0.25 mg via RESPIRATORY_TRACT
  Filled 2015-12-11 (×7): qty 2

## 2015-12-11 MED ORDER — DOCUSATE SODIUM 100 MG PO CAPS
100.0000 mg | ORAL_CAPSULE | Freq: Two times a day (BID) | ORAL | Status: DC
  Administered 2015-12-11 – 2015-12-15 (×8): 100 mg via ORAL
  Filled 2015-12-11 (×8): qty 1

## 2015-12-11 MED ORDER — ENSURE ENLIVE PO LIQD
237.0000 mL | Freq: Every day | ORAL | Status: DC
  Administered 2015-12-12 – 2015-12-15 (×4): 237 mL via ORAL

## 2015-12-11 MED ORDER — LOSARTAN POTASSIUM 50 MG PO TABS
50.0000 mg | ORAL_TABLET | Freq: Every day | ORAL | Status: DC
  Administered 2015-12-12 – 2015-12-15 (×4): 50 mg via ORAL
  Filled 2015-12-11 (×4): qty 1

## 2015-12-11 MED ORDER — BENZONATATE 100 MG PO CAPS
100.0000 mg | ORAL_CAPSULE | Freq: Three times a day (TID) | ORAL | Status: DC | PRN
  Administered 2015-12-11: 100 mg via ORAL
  Filled 2015-12-11: qty 1

## 2015-12-11 MED ORDER — ADULT MULTIVITAMIN W/MINERALS CH
1.0000 | ORAL_TABLET | Freq: Every day | ORAL | Status: DC
  Administered 2015-12-12 – 2015-12-15 (×4): 1 via ORAL
  Filled 2015-12-11 (×4): qty 1

## 2015-12-11 MED ORDER — CLOPIDOGREL BISULFATE 75 MG PO TABS
75.0000 mg | ORAL_TABLET | Freq: Every day | ORAL | Status: DC
  Administered 2015-12-12 – 2015-12-15 (×4): 75 mg via ORAL
  Filled 2015-12-11 (×4): qty 1

## 2015-12-11 MED ORDER — FLUTICASONE PROPIONATE HFA 110 MCG/ACT IN AERO: 2.0000 | INHALATION_SPRAY | Freq: Two times a day (BID) | RESPIRATORY_TRACT | Status: DC

## 2015-12-11 MED ORDER — CLONIDINE HCL 0.1 MG PO TABS
0.1000 mg | ORAL_TABLET | Freq: Three times a day (TID) | ORAL | Status: DC
  Administered 2015-12-12 – 2015-12-15 (×11): 0.1 mg via ORAL
  Filled 2015-12-11 (×11): qty 1

## 2015-12-11 MED ORDER — VITAMIN D 1000 UNITS PO TABS
1000.0000 [IU] | ORAL_TABLET | Freq: Every day | ORAL | Status: DC
  Administered 2015-12-12 – 2015-12-15 (×4): 1000 [IU] via ORAL
  Filled 2015-12-11 (×4): qty 1

## 2015-12-11 MED ORDER — IPRATROPIUM-ALBUTEROL 0.5-2.5 (3) MG/3ML IN SOLN
3.0000 mL | Freq: Four times a day (QID) | RESPIRATORY_TRACT | Status: DC
  Administered 2015-12-12 (×3): 3 mL via RESPIRATORY_TRACT
  Filled 2015-12-11 (×4): qty 3

## 2015-12-11 MED ORDER — LORAZEPAM 0.5 MG PO TABS
0.5000 mg | ORAL_TABLET | Freq: Three times a day (TID) | ORAL | Status: DC
  Administered 2015-12-11 – 2015-12-14 (×8): 0.5 mg via ORAL
  Filled 2015-12-11 (×8): qty 1

## 2015-12-11 MED ORDER — LEVOTHYROXINE SODIUM 100 MCG PO TABS
100.0000 ug | ORAL_TABLET | Freq: Every day | ORAL | Status: DC
  Administered 2015-12-12 – 2015-12-15 (×4): 100 ug via ORAL
  Filled 2015-12-11 (×4): qty 1

## 2015-12-11 MED ORDER — SODIUM BICARBONATE 650 MG PO TABS
325.0000 mg | ORAL_TABLET | Freq: Four times a day (QID) | ORAL | Status: DC
  Administered 2015-12-11 – 2015-12-15 (×15): 325 mg via ORAL
  Filled 2015-12-11 (×15): qty 1

## 2015-12-11 MED ORDER — GUAIFENESIN-DM 100-10 MG/5ML PO SYRP
5.0000 mL | ORAL_SOLUTION | ORAL | Status: DC | PRN
  Administered 2015-12-13 – 2015-12-14 (×2): 5 mL via ORAL
  Filled 2015-12-11 (×2): qty 5

## 2015-12-11 MED ORDER — SODIUM CHLORIDE 0.9% FLUSH
3.0000 mL | Freq: Two times a day (BID) | INTRAVENOUS | Status: DC
  Administered 2015-12-11 – 2015-12-15 (×8): 3 mL via INTRAVENOUS

## 2015-12-11 MED ORDER — ENOXAPARIN SODIUM 40 MG/0.4ML ~~LOC~~ SOLN
40.0000 mg | SUBCUTANEOUS | Status: DC
  Administered 2015-12-11: 40 mg via SUBCUTANEOUS
  Filled 2015-12-11: qty 0.4

## 2015-12-11 MED ORDER — METHYLPREDNISOLONE SODIUM SUCC 125 MG IJ SOLR
62.5000 mg | Freq: Once | INTRAMUSCULAR | Status: AC
  Administered 2015-12-11: 62.5 mg via INTRAVENOUS
  Filled 2015-12-11: qty 2

## 2015-12-11 MED ORDER — METHYLPREDNISOLONE SODIUM SUCC 125 MG IJ SOLR
60.0000 mg | Freq: Four times a day (QID) | INTRAMUSCULAR | Status: DC
  Administered 2015-12-11 – 2015-12-13 (×7): 60 mg via INTRAVENOUS
  Filled 2015-12-11 (×7): qty 2

## 2015-12-11 MED ORDER — TIOTROPIUM BROMIDE MONOHYDRATE 18 MCG IN CAPS
18.0000 ug | ORAL_CAPSULE | Freq: Every day | RESPIRATORY_TRACT | Status: DC
  Administered 2015-12-12 – 2015-12-15 (×4): 18 ug via RESPIRATORY_TRACT
  Filled 2015-12-11: qty 5

## 2015-12-11 MED ORDER — ONDANSETRON HCL 4 MG PO TABS: 4.0000 mg | ORAL_TABLET | Freq: Four times a day (QID) | ORAL | Status: DC | PRN

## 2015-12-11 MED ORDER — OXYBUTYNIN CHLORIDE 10 % TD GEL
1.0000 "application " | Freq: Every day | TRANSDERMAL | Status: DC
  Administered 2015-12-13 – 2015-12-14 (×2): 1 via TRANSDERMAL
  Filled 2015-12-11 (×3): qty 1

## 2015-12-11 MED ORDER — FAMOTIDINE 20 MG PO TABS
10.0000 mg | ORAL_TABLET | Freq: Every day | ORAL | Status: DC
  Administered 2015-12-12 – 2015-12-15 (×4): 10 mg via ORAL
  Filled 2015-12-11 (×4): qty 1

## 2015-12-11 NOTE — H&P (Signed)
[facility] [place] at [facility]   PATIENT NAME: Alyssa Landry    MR#:  [id number]  DATE OF BIRTH:  1919-03-14  DATE OF ADMISSION:  12/11/2015  PRIMARY CARE PHYSICIAN: Rafael Bihari, MD   REQUESTING/REFERRING PHYSICIAN: Lenard Lance, MD  CHIEF COMPLAINT:  Shortness of Breath  HISTORY OF PRESENT ILLNESS:  Alyssa Landry  is a 82 y.o. female with a known history of  COPD, hypertension, TIA, gastric reflux, anxiety, asthma and SIADH in the past, came to the  emergency department with difficulty breathing. According to the patient since this morning she has been experiencing shortness of breath and wheeze. She states a history of asthma, but no relief with breathing treatment. Patient denies any chest pain. Denies any fever. Denies any  congestion. Reporting productive cough Patient describes shortness breath is moderate, much worse with exertion. No past medical history of atrial fibrillation but during my examination patient is in A. fib with RVR. Has chronic history of hyponatremia and takes sodium tablets. Her baseline seemed to be at 128 and today it is at 118, patient is answering questions but short of breath and working hard to breathe. Daughter at bedside  PAST MEDICAL HISTORY:   Past Medical History  Diagnosis Date  . Hypertension   . COPD (chronic obstructive pulmonary disease) (HCC)   . TIA (transient ischemic attack)   . Acid reflux   . Arthritis   . Diabetes (HCC)   . Hyperlipidemia   . Anxiety   . Hypothyroidism   . Hiatal hernia   . Asthma   . Incontinence   . Osteoporosis, post-menopausal   . Chronic hyponatremia   . SIADH (syndrome of inappropriate ADH production) (HCC)   . GERD (gastroesophageal reflux disease)   . Hiatal hernia   . Migraine   . Anemia   . Pulmonary fibrosis (HCC)   . Persistent cough     PAST SURGICAL HISTOIRY:   Past Surgical History  Procedure Laterality Date  . Abdominal hysterectomy  1966     SOCIAL HISTORY:   Social History  Substance Use Topics  . Smoking status: Never Smoker   . Smokeless tobacco: Not on file  . Alcohol Use: No    FAMILY HISTORY:   Family History  Problem Relation Age of Onset  . Leukemia Mother   . Heart disease Father   . Hypertension      DRUG ALLERGIES:   Allergies  Allergen Reactions  . Codeine Other (See Comments)    Reaction:  Unknown   . Ditropan [Oxybutynin] Other (See Comments)    Reaction:  Unknown   . Hctz [Hydrochlorothiazide] Other (See Comments)    Reaction:  Causes pts sodium/potassium levels to drop significantly   . Metronidazole Other (See Comments)    Reaction:  Unknown   . Propranolol Other (See Comments)    Reaction:  Unknown   . Tavist [Clemastine] Other (See Comments)    Reaction:  Unknown   . Tramadol Other (See Comments)    Reaction:  Unknown   . Vicodin [Hydrocodone-Acetaminophen] Other (See Comments)    Reaction:  Unknown     REVIEW OF SYSTEMS:  CONSTITUTIONAL: No fever, fatigue or weakness.  EYES: No blurred or double vision.  EARS, NOSE, AND THROAT: No tinnitus or ear pain.  RESPIRATORY: Reporting productive cough, shortness of breath, wheezing  , denies hemoptysis.  CARDIOVASCULAR: No chest pain, orthopnea, edema.  GASTROINTESTINAL: No nausea, vomiting, diarrhea or abdominal pain.  GENITOURINARY: No dysuria,  hematuria.  ENDOCRINE: No polyuria, nocturia,  HEMATOLOGY: No anemia, easy bruising or bleeding SKIN: No rash or lesion. MUSCULOSKELETAL: No joint pain or arthritis.   NEUROLOGIC: No tingling, numbness, weakness.  PSYCHIATRY: No anxiety or depression.   MEDICATIONS AT HOME:   Prior to Admission medications   Medication Sig Start Date End Date Taking? Authorizing Provider  albuterol (PROVENTIL HFA;VENTOLIN HFA) 108 (90 BASE) MCG/ACT inhaler Inhale 1-2 puffs into the lungs every 4 (four) hours as needed for wheezing or shortness of breath.   Yes Historical Provider, MD  amLODipine  (NORVASC) 10 MG tablet Take 10 mg by mouth daily.   Yes Historical Provider, MD  aspirin EC 81 MG tablet Take 81 mg by mouth every evening.    Yes Historical Provider, MD  benzonatate (TESSALON) 100 MG capsule Take 100 mg by mouth 3 (three) times daily as needed for cough.   Yes Historical Provider, MD  cefUROXime (CEFTIN) 250 MG tablet Take 250 mg by mouth 2 (two) times daily with a meal. 12/10/15 12/20/15 Yes Historical Provider, MD  cholecalciferol (VITAMIN D) 1000 UNITS tablet Take 1,000 Units by mouth daily.   Yes Historical Provider, MD  cloNIDine (CATAPRES) 0.1 MG tablet Take 0.1 mg by mouth 3 (three) times daily.   Yes Historical Provider, MD  clopidogrel (PLAVIX) 75 MG tablet Take 75 mg by mouth daily.   Yes Historical Provider, MD  docusate sodium (COLACE) 100 MG capsule Take 100 mg by mouth 2 (two) times daily.    Yes Historical Provider, MD  feeding supplement, ENSURE ENLIVE, (ENSURE ENLIVE) LIQD Take 237 mLs by mouth daily.    Yes Historical Provider, MD  ferrous sulfate 325 (65 FE) MG tablet Take 325 mg by mouth 2 (two) times daily with a meal.    Yes Historical Provider, MD  fluticasone (FLONASE) 50 MCG/ACT nasal spray Place 2 sprays into both nostrils daily.    Yes Historical Provider, MD  fluticasone (FLOVENT HFA) 110 MCG/ACT inhaler Inhale 2 puffs into the lungs 2 (two) times daily.   Yes Historical Provider, MD  levothyroxine (SYNTHROID, LEVOTHROID) 100 MCG tablet Take 100 mcg by mouth daily before breakfast.   Yes Historical Provider, MD  LORazepam (ATIVAN) 0.5 MG tablet Take 0.5 mg by mouth 3 (three) times daily.    Yes Historical Provider, MD  losartan (COZAAR) 50 MG tablet Take 50 mg by mouth daily.   Yes Historical Provider, MD  Multiple Vitamin (MULTIVITAMIN WITH MINERALS) TABS tablet Take 1 tablet by mouth daily.   Yes Historical Provider, MD  Oxybutynin Chloride 10 % GEL Place 1 application onto the skin at bedtime.   Yes Historical Provider, MD  polyethylene glycol (MIRALAX  / GLYCOLAX) packet Take 17 g by mouth daily.   Yes Historical Provider, MD  ranitidine (ZANTAC) 150 MG tablet Take 150 mg by mouth 2 (two) times daily before a meal.    Yes Historical Provider, MD  sodium bicarbonate 325 MG tablet Take 325 mg by mouth 4 (four) times daily.   Yes Historical Provider, MD  tiotropium (SPIRIVA) 18 MCG inhalation capsule Place 18 mcg into inhaler and inhale daily.   Yes Historical Provider, MD  vitamin B-12 (CYANOCOBALAMIN) 1000 MCG tablet Take 1,000 mcg by mouth daily.   Yes Historical Provider, MD      VITAL SIGNS:  Blood pressure 151/51, pulse 115, temperature 97.5 F (36.4 C), temperature source Oral, resp. rate 23, height 5\' 2"  (1.575 m), weight 48.988 kg (108 lb), SpO2 97 %.  PHYSICAL EXAMINATION:  GENERAL:  82 y.o.-year-old patient lying in the bed with no acute distress.  EYES: Pupils equal, round, reactive to light and accommodation. No scleral icterus. Extraocular muscles intact.  HEENT: Head atraumatic, normocephalic. Oropharynx and nasopharynx clear.  NECK:  Supple, no jugular venous distention. No thyroid enlargement, no tenderness.  LUNGS: Diminished breath sounds bilaterally, diffuse wheezing, positive crackles   no rales,rhonchi or crepitation. positive use of accessory muscles of respiration.  CARDIOVASCULAR: Irregularly irregular with murmur, no  rubs, or gallops.  ABDOMEN: Soft, nontender, nondistended. Bowel sounds present. No organomegaly or mass.  EXTREMITIES: No pedal edema, cyanosis, or clubbing.  NEUROLOGIC: Cranial nerves II through XII are intact. Muscle strength 5/5 in all extremities. Sensation intact. Gait not checked.  PSYCHIATRIC: The patient is alert and oriented x 3.  SKIN: No obvious rash, lesion, or ulcer.   LABORATORY PANEL:   CBC  Recent Labs Lab 12/11/15 1439  WBC 11.2*  HGB 10.9*  HCT 32.1*  PLT 300    ------------------------------------------------------------------------------------------------------------------  Chemistries   Recent Labs Lab 12/11/15 1439  NA 118*  K 4.2  CL 81*  CO2 27  GLUCOSE 135*  BUN 11  CREATININE 0.42*  CALCIUM 8.8*  AST 29  ALT 25  ALKPHOS 121  BILITOT 0.9   ------------------------------------------------------------------------------------------------------------------  Cardiac Enzymes  Recent Labs Lab 12/11/15 1439  TROPONINI <0.03   ------------------------------------------------------------------------------------------------------------------  RADIOLOGY:  Dg Chest Portable 1 View  12/11/2015  CLINICAL DATA:  Worsening shortness of breath. History of pulmonary fibrosis. EXAM: PORTABLE CHEST 1 VIEW COMPARISON:  06/26/2015 FINDINGS: Increased densities at the costophrenic angles bilaterally, left side greater than right. Findings are suggestive for pleural effusions. Upper lungs are clear. Cardiac silhouette is within normal limits and stable. Atherosclerotic calcifications at the aortic arch. Increased densities along the medial right lung base. IMPRESSION: Bibasilar chest densities, left side greater than right. Findings may be associated with pleural effusions but cannot exclude airspace disease. Electronically Signed   By: Richarda Overlie M.D.   On: 12/11/2015 16:11    EKG:   Orders placed or performed during the hospital encounter of 12/11/15  . EKG 12-Lead  . EKG 12-Lead    IMPRESSION AND PLAN:   Kassiah Mccrory  is a 82 y.o. female with a known history of  COPD, hypertension, TIA, gastric reflux, anxiety, asthma and SIADH in the past, came to the  emergency department with difficulty breathing. According to the patient since this morning she has been experiencing shortness of breath and wheeze. She states a history of asthma, but no relief with breathing treatment. Patient denies any chest pain. Denies any fever. Denies any   congestion. Reporting productive cough Patient describes shortness breath is moderate, much worse with exertion. No past medical history of atrial fibrillation but during my examination patient is in A. fib with RVR. Has chronic history of hyponatremia and takes sodium tablets. Her baseline seemed to be at 128 and today it is at 118   #acute respiratory failure secondary to reactive airway disease exacerbation with underlying pulmonary fibrosis/possible pneumonia/A. fib with RVR  Admit patient to stepdown unit  BiPAP Consult pulmonology Provide Solu-Medrol, breathing treatments and IV Levaquin Sputum culture and sensitivity  #New-onset A. fib with RVR Cardizem when necessary, hydration with IV fluids Continue baby aspirin, patient is not a good candidate for anticoagulation Check TSH level  #Hyponatremia acute on chronic Baseline sodium is 128 and today she is at 118 but mentating fine Will recheck sodium after IV fluid  bolus Monitor sodium levels every 6 hours Given the history of SIADH in the past will consider fluid restriction and continuing sodium tablets Nephrology consult is placed and discussed with Dr. Wynelle Linkkolluru  #Chronic pulmonary fibrosis Provide oxygen and pulmonology consult  Provide GI and DVT prophylaxis   Condition critical, high risk of mortality  All the records are reviewed and case discussed with ED provider. Management plans discussed with the patient, family and they are in agreement.  CODE STATUS: DNR /  Daughter is the HCPOA  TOTALCritical care time  TAKING CARE OF THIS PATIENT:  50 minutes.    Ramonita LabGouru, Cameka Rae M.D on 12/11/2015 at 5:50 PM  Between 7am to 6pm - Pager - [phone]  After 6pm go to [email or web address] - password [email or web address] [email or web address]  [facility]  Office  [phone]  CC: Primary care physician; Rafael BihariWALKER III, JOHN B, MD

## 2015-12-11 NOTE — ED Notes (Signed)
Pt arrived to ED via EMS from home reporting increasing SOB since yesterday. EMS reports pt was 92% on RA with audible exp. Wheezing upon arrival to scene. PT was given 2 duonebs per EMS in route. PT continues to wheezing uon arrival to ED. PT began antibiotic treatment of trachyitis yesterday. Pt is alert and oriented x 4.

## 2015-12-11 NOTE — ED Provider Notes (Signed)
[facility] Emergency Department Provider Note  Time seen: 4:57 PM  I have reviewed the triage vital signs and the nursing notes.   HISTORY  Chief Complaint Shortness of Breath    HPI Alyssa Landry is a 82 y.o. female with a past medical history of COPD, hypertension, TIA, gastric reflux, anxiety, asthma, SIADH in the past, who presents the emergency department with difficulty breathing. According to the patient since this morning she has been experiencing shortness of breath and wheeze. She states a history of asthma, but no relief with breathing treatment. Patient denies any chest pain. Denies any fever. Denies any cough or congestion. Patient describes shortness breath is moderate, much worse with exertion.     Past Medical History  Diagnosis Date  . Hypertension   . COPD (chronic obstructive pulmonary disease) (HCC)   . TIA (transient ischemic attack)   . Acid reflux   . Arthritis   . Diabetes (HCC)   . Hyperlipidemia   . Anxiety   . Hypothyroidism   . Hiatal hernia   . Asthma   . Incontinence   . Osteoporosis, post-menopausal   . Chronic hyponatremia   . SIADH (syndrome of inappropriate ADH production) (HCC)   . GERD (gastroesophageal reflux disease)   . Hiatal hernia   . Migraine   . Anemia   . Pulmonary fibrosis (HCC)   . Persistent cough     Patient Active Problem List   Diagnosis Date Noted  . Urge incontinence 09/26/2015  . Lichenified rash 09/26/2015  . Syncopal episodes 09/25/2015    Past Surgical History  Procedure Laterality Date  . Abdominal hysterectomy  1966    Current Outpatient Rx  Name  Route  Sig  Dispense  Refill  . albuterol (PROVENTIL HFA;VENTOLIN HFA) 108 (90 BASE) MCG/ACT inhaler   Inhalation   Inhale 1-2 puffs into the lungs every 4 (four) hours as needed for wheezing or shortness of breath.         Marland Kitchen amLODipine (NORVASC) 10 MG tablet   Oral   Take 10 mg by mouth daily.         Marland Kitchen aspirin EC 81  MG tablet   Oral   Take 81 mg by mouth every evening.          . benzonatate (TESSALON) 100 MG capsule   Oral   Take 100 mg by mouth 3 (three) times daily as needed for cough.         . cefUROXime (CEFTIN) 250 MG tablet   Oral   Take 250 mg by mouth 2 (two) times daily with a meal.         . cholecalciferol (VITAMIN D) 1000 UNITS tablet   Oral   Take 1,000 Units by mouth daily.         . cloNIDine (CATAPRES) 0.1 MG tablet   Oral   Take 0.1 mg by mouth 3 (three) times daily.         . clopidogrel (PLAVIX) 75 MG tablet   Oral   Take 75 mg by mouth daily.         Marland Kitchen docusate sodium (COLACE) 100 MG capsule   Oral   Take 100 mg by mouth 2 (two) times daily.          . feeding supplement, ENSURE ENLIVE, (ENSURE ENLIVE) LIQD   Oral   Take 237 mLs by mouth daily.          . ferrous sulfate 325 (  65 FE) MG tablet   Oral   Take 325 mg by mouth 2 (two) times daily with a meal.          . fluticasone (FLONASE) 50 MCG/ACT nasal spray   Each Nare   Place 2 sprays into both nostrils daily.          . fluticasone (FLOVENT HFA) 110 MCG/ACT inhaler   Inhalation   Inhale 2 puffs into the lungs 2 (two) times daily.         Marland Kitchen. levothyroxine (SYNTHROID, LEVOTHROID) 100 MCG tablet   Oral   Take 100 mcg by mouth daily before breakfast.         . LORazepam (ATIVAN) 0.5 MG tablet   Oral   Take 0.5 mg by mouth 3 (three) times daily.          Marland Kitchen. losartan (COZAAR) 50 MG tablet   Oral   Take 50 mg by mouth daily.         . Multiple Vitamin (MULTIVITAMIN WITH MINERALS) TABS tablet   Oral   Take 1 tablet by mouth daily.         . Oxybutynin Chloride 10 % GEL   Transdermal   Place 1 application onto the skin at bedtime.         . polyethylene glycol (MIRALAX / GLYCOLAX) packet   Oral   Take 17 g by mouth daily.         . ranitidine (ZANTAC) 150 MG tablet   Oral   Take 150 mg by mouth 2 (two) times daily before a meal.          . sodium  bicarbonate 325 MG tablet   Oral   Take 325 mg by mouth 4 (four) times daily.         Marland Kitchen. tiotropium (SPIRIVA) 18 MCG inhalation capsule   Inhalation   Place 18 mcg into inhaler and inhale daily.         . vitamin B-12 (CYANOCOBALAMIN) 1000 MCG tablet   Oral   Take 1,000 mcg by mouth daily.           Allergies Codeine; Ditropan; Hctz; Metronidazole; Propranolol; Tavist; Tramadol; and Vicodin  Family History  Problem Relation Age of Onset  . Leukemia Mother   . Heart disease Father   . Hypertension      Social History Social History  Substance Use Topics  . Smoking status: Never Smoker   . Smokeless tobacco: None  . Alcohol Use: No    Review of Systems Constitutional: Negative for fever. Cardiovascular: Negative for chest pain. Respiratory: Positive for shortness of breath. Gastrointestinal: Negative for abdominal pain Musculoskeletal: Negative for back pain. Neurological: Negative for headache 10-point ROS otherwise negative.  ____________________________________________   PHYSICAL EXAM:  VITAL SIGNS: ED Triage Vitals  Enc Vitals Group     BP 12/11/15 1409 143/65 mmHg     Pulse Rate 12/11/15 1409 103     Resp 12/11/15 1409 22     Temp 12/11/15 1409 97.5 F (36.4 C)     Temp Source 12/11/15 1409 Oral     SpO2 12/11/15 1409 98 %     Weight 12/11/15 1409 108 lb (48.988 kg)     Height 12/11/15 1409 5\' 2"  (1.575 m)     Head Cir --      Peak Flow --      Pain Score --      Pain Loc --      Pain  Edu? --      Excl. in GC? --     Constitutional: Alert and oriented. Well appearing and in no distress. Eyes: Normal exam ENT   Head: Normocephalic and atraumatic.   Mouth/Throat: Mucous membranes are moist. Cardiovascular: Normal rate, regular rhythm. No murmur Respiratory: Mild tachypnea around 25 breast per minute. No retractions. Patient sitting upright in bed with moderate expiratory wheeze bilaterally. Gastrointestinal: Soft and nontender. No  distention.   Musculoskeletal: Nontender with normal range of motion in all extremities. No lower extremity tenderness or edema. Neurologic:  Normal speech and language. No gross focal neurologic deficits Skin:  Skin is warm, dry and intact.  Psychiatric: Mood and affect are normal.   ____________________________________________    EKG  EKG reviewed and interpreted by myself shows possible atrial fibrillation 102 bpm. A. fib versus sinus arrhythmia, model at school and apparently difficult to decipher. Nonspecific ST changes. Narrow QRS.  ____________________________________________    RADIOLOGY  Chest x-ray shows small bilateral pleural effusions versus infiltrate.  ____________________________________________   INITIAL IMPRESSION / ASSESSMENT AND PLAN / ED COURSE  Pertinent labs & imaging results that were available during my care of the patient were reviewed by me and considered in my medical decision making (see chart for details).  Patient presents the emergency department with shortness breath starting this morning. Asians exam is significant for moderate expiratory wheeze. Patient given duo nebs and Solu-Medrol in the emergency department. Patient's chest x-ray shows possible bilateral pleural effusions versus infiltrate, given the patient's difficulty breathing and hypoxia we will dose IV Levaquin to cover for any infectious process. Patient's labs are resulted significant for a sodium of 118. Patient does have a history of SIADH although her sodium appears to run 128-134 normally. Also the patient's EKG shows possible atrial fibrillation, no history of atrial fibrillation on record reviewed. Patient will be admitted to the hospital for further treatment and workup.  ____________________________________________   FINAL CLINICAL IMPRESSION(S) / ED DIAGNOSES  Dyspnea Hyponatremia Pneumonia Atrial fibrillation   Minna Antis, MD 12/11/15 1706

## 2015-12-11 NOTE — Progress Notes (Signed)
Anticoagulation monitoring(Lovenox):  82 yo  ordered Lovenox 30 mg Q24h  Filed Weights   12/11/15 1409  Weight: 108 lb (48.988 kg)   BMI  Lab Results  Component Value Date   CREATININE 0.42* 12/11/2015   CREATININE 0.50 09/26/2015   CREATININE 0.64 09/25/2015   Estimated Creatinine Clearance: 32.5 mL/min (by C-G formula based on Cr of 0.42). Hemoglobin & Hematocrit     Component Value Date/Time   HGB 10.9* 12/11/2015 1439   HGB 9.9* 08/12/2012 0526   HCT 32.1* 12/11/2015 1439   HCT 28.4* 08/12/2012 0526     Per Protocol for Patient with estCrcl>  30 ml/min and BMI < 40, will transition to Lovenox 40 mg Q24H.

## 2015-12-11 NOTE — Progress Notes (Signed)
ANTIBIOTIC CONSULT NOTE - INITIAL  Pharmacy Consult for Levaquin  Indication: pneumonia  Allergies  Allergen Reactions  . Codeine Other (See Comments)    Reaction:  Unknown   . Ditropan [Oxybutynin] Other (See Comments)    Reaction:  Unknown   . Hctz [Hydrochlorothiazide] Other (See Comments)    Reaction:  Causes pts sodium/potassium levels to drop significantly   . Metronidazole Other (See Comments)    Reaction:  Unknown   . Propranolol Other (See Comments)    Reaction:  Unknown   . Tavist [Clemastine] Other (See Comments)    Reaction:  Unknown   . Tramadol Other (See Comments)    Reaction:  Unknown   . Vicodin [Hydrocodone-Acetaminophen] Other (See Comments)    Reaction:  Unknown     Patient Measurements: Height: 5\' 2"  (157.5 cm) Weight: 108 lb (48.988 kg) IBW/kg (Calculated) : 50.1 Adjusted Body Weight:   Vital Signs: Temp: 97.5 F (36.4 C) (05/31 1409) Temp Source: Oral (05/31 1409) BP: 124/56 mmHg (05/31 2001) Pulse Rate: 83 (05/31 2001) Intake/Output from previous day:   Intake/Output from this shift:    Labs:  Recent Labs  12/11/15 1439  WBC 11.2*  HGB 10.9*  PLT 300  CREATININE 0.42*   Estimated Creatinine Clearance: 32.5 mL/min (by C-G formula based on Cr of 0.42). No results for input(s): VANCOTROUGH, VANCOPEAK, VANCORANDOM, GENTTROUGH, GENTPEAK, GENTRANDOM, TOBRATROUGH, TOBRAPEAK, TOBRARND, AMIKACINPEAK, AMIKACINTROU, AMIKACIN in the last 72 hours.   Microbiology: No results found for this or any previous visit (from the past 720 hour(s)).  Medical History: Past Medical History  Diagnosis Date  . Hypertension   . COPD (chronic obstructive pulmonary disease) (HCC)   . TIA (transient ischemic attack)   . Acid reflux   . Arthritis   . Hyperlipidemia   . Anxiety   . Hypothyroidism   . Hiatal hernia   . Asthma   . Incontinence   . Osteoporosis, post-menopausal   . Chronic hyponatremia   . SIADH (syndrome of inappropriate ADH production)  (HCC)   . GERD (gastroesophageal reflux disease)   . Hiatal hernia   . Migraine   . Anemia   . Pulmonary fibrosis (HCC)   . Persistent cough   . A-fib (HCC)     Medications:  Prescriptions prior to admission  Medication Sig Dispense Refill Last Dose  . albuterol (PROVENTIL HFA;VENTOLIN HFA) 108 (90 BASE) MCG/ACT inhaler Inhale 1-2 puffs into the lungs every 4 (four) hours as needed for wheezing or shortness of breath.   12/11/2015 at Unknown time  . amLODipine (NORVASC) 10 MG tablet Take 10 mg by mouth daily.   12/11/2015 at Unknown time  . aspirin EC 81 MG tablet Take 81 mg by mouth every evening.    12/10/2015 at 1800  . benzonatate (TESSALON) 100 MG capsule Take 100 mg by mouth 3 (three) times daily as needed for cough.   12/11/2015 at Unknown time  . cefUROXime (CEFTIN) 250 MG tablet Take 250 mg by mouth 2 (two) times daily with a meal.   12/11/2015 at Unknown time  . cholecalciferol (VITAMIN D) 1000 UNITS tablet Take 1,000 Units by mouth daily.   12/11/2015 at Unknown time  . cloNIDine (CATAPRES) 0.1 MG tablet Take 0.1 mg by mouth 3 (three) times daily.   12/11/2015 at Unknown time  . clopidogrel (PLAVIX) 75 MG tablet Take 75 mg by mouth daily.   12/11/2015 at 0900  . docusate sodium (COLACE) 100 MG capsule Take 100 mg by mouth 2 (  two) times daily.    12/11/2015 at Unknown time  . feeding supplement, ENSURE ENLIVE, (ENSURE ENLIVE) LIQD Take 237 mLs by mouth daily.    12/11/2015 at Unknown time  . ferrous sulfate 325 (65 FE) MG tablet Take 325 mg by mouth 2 (two) times daily with a meal.    12/11/2015 at Unknown time  . fluticasone (FLONASE) 50 MCG/ACT nasal spray Place 2 sprays into both nostrils daily.    12/11/2015 at Unknown time  . fluticasone (FLOVENT HFA) 110 MCG/ACT inhaler Inhale 2 puffs into the lungs 2 (two) times daily.   12/11/2015 at Unknown time  . levothyroxine (SYNTHROID, LEVOTHROID) 100 MCG tablet Take 100 mcg by mouth daily before breakfast.   12/11/2015 at Unknown time  .  LORazepam (ATIVAN) 0.5 MG tablet Take 0.5 mg by mouth 3 (three) times daily.    12/11/2015 at Unknown time  . losartan (COZAAR) 50 MG tablet Take 50 mg by mouth daily.   12/11/2015 at Unknown time  . Multiple Vitamin (MULTIVITAMIN WITH MINERALS) TABS tablet Take 1 tablet by mouth daily.   12/11/2015 at Unknown time  . Oxybutynin Chloride 10 % GEL Place 1 application onto the skin at bedtime.   12/10/2015 at Unknown time  . polyethylene glycol (MIRALAX / GLYCOLAX) packet Take 17 g by mouth daily.   12/11/2015 at Unknown time  . ranitidine (ZANTAC) 150 MG tablet Take 150 mg by mouth 2 (two) times daily before a meal.    12/11/2015 at Unknown time  . sodium bicarbonate 325 MG tablet Take 325 mg by mouth 4 (four) times daily.   12/11/2015 at Unknown time  . tiotropium (SPIRIVA) 18 MCG inhalation capsule Place 18 mcg into inhaler and inhale daily.   12/11/2015 at Unknown time  . vitamin B-12 (CYANOCOBALAMIN) 1000 MCG tablet Take 1,000 mcg by mouth daily.   12/11/2015 at Unknown time   Assessment: Pharmacy consulted to dose levaquin in this 82 year old female admitted with CAP.    Levaquin 750 mg IV x 1 given on 5/31. CrCl = 32.5 ml/min  Goal of Therapy:  resolution of infection  Plan:  Expected duration 7 days with resolution of temperature and/or normalization of WBC   Levaquin 750 mg IV X 1 given on 5/31. Levaquin 250 mg IV Q24H to start on 6/1 @ 18:00.   Dameir Gentzler D 12/11/2015,9:59 PM

## 2015-12-12 LAB — BASIC METABOLIC PANEL
ANION GAP: 9 (ref 5–15)
ANION GAP: 10 (ref 5–15)
BUN: 13 mg/dL (ref 6–20)
BUN: 13 mg/dL (ref 6–20)
CHLORIDE: 83 mmol/L — AB (ref 101–111)
CHLORIDE: 82 mmol/L — AB (ref 101–111)
CO2: 25 mmol/L (ref 22–32)
CO2: 25 mmol/L (ref 22–32)
CREATININE: 0.46 mg/dL (ref 0.44–1.00)
Calcium: 8.8 mg/dL — ABNORMAL LOW (ref 8.9–10.3)
Calcium: 9 mg/dL (ref 8.9–10.3)
Creatinine, Ser: 0.45 mg/dL (ref 0.44–1.00)
GFR calc non Af Amer: >60 mL/min (ref >60)
GFR calc non Af Amer: >60 mL/min (ref >60)
Glucose, Bld: 162 mg/dL — ABNORMAL HIGH (ref 65–99)
Glucose, Bld: 160 mg/dL — ABNORMAL HIGH (ref 65–99)
POTASSIUM: 4.7 mmol/L (ref 3.5–5.1)
Potassium: 4.5 mmol/L (ref 3.5–5.1)
SODIUM: 117 mmol/L — AB (ref 135–145)
SODIUM: 117 mmol/L — AB (ref 135–145)

## 2015-12-12 LAB — SODIUM
Sodium: 116 mmol/L — CL (ref 135–145)
Sodium: 118 mmol/L — CL (ref 135–145)

## 2015-12-12 LAB — CBC
HCT: 29.8 % — ABNORMAL LOW (ref 35.0–47.0)
HEMOGLOBIN: 10.7 g/dL — AB (ref 12.0–16.0)
MCH: 31.1 pg (ref 26.0–34.0)
MCHC: 35.7 g/dL (ref 32.0–36.0)
MCV: 87 fL (ref 80.0–100.0)
PLATELETS: 333 10*3/uL (ref 150–440)
RBC: 3.43 MIL/uL — AB (ref 3.80–5.20)
RDW: 13 % (ref 11.5–14.5)
WBC: 8.8 10*3/uL (ref 3.6–11.0)

## 2015-12-12 LAB — TSH: TSH: 0.736 u[IU]/mL (ref 0.350–4.500)

## 2015-12-12 LAB — OSMOLALITY: Osmolality: 249 mOsm/kg — CL (ref 275–295)

## 2015-12-12 LAB — OSMOLALITY, URINE: Osmolality, Ur: 526 mosm/kg (ref 300–900)

## 2015-12-12 LAB — SODIUM, URINE, RANDOM: Sodium, Ur: <10 mmol/L

## 2015-12-12 MED ORDER — DEXTROSE 5 % IV SOLN
1.0000 g | INTRAVENOUS | Status: DC
  Administered 2015-12-12 – 2015-12-14 (×3): 1 g via INTRAVENOUS
  Filled 2015-12-12 (×4): qty 10

## 2015-12-12 MED ORDER — AZITHROMYCIN 250 MG PO TABS
500.0000 mg | ORAL_TABLET | Freq: Every day | ORAL | Status: DC
  Administered 2015-12-12 – 2015-12-15 (×4): 500 mg via ORAL
  Filled 2015-12-12 (×4): qty 2

## 2015-12-12 MED ORDER — FUROSEMIDE 10 MG/ML IJ SOLN
20.0000 mg | Freq: Once | INTRAMUSCULAR | Status: AC
  Administered 2015-12-12: 20 mg via INTRAVENOUS
  Filled 2015-12-12: qty 2

## 2015-12-12 MED ORDER — DILTIAZEM HCL 30 MG PO TABS
30.0000 mg | ORAL_TABLET | Freq: Four times a day (QID) | ORAL | Status: DC
  Administered 2015-12-12 – 2015-12-15 (×10): 30 mg via ORAL
  Filled 2015-12-12 (×11): qty 1

## 2015-12-12 NOTE — Progress Notes (Signed)
Dr. Wynelle LinkKolluru notified of sodium level and that patient is having low urine output. Patient has 350 mL in bladder per bladder scanner. Verbal order received for IV lasix one time. Order placed. Trudee KusterBrandi R Mansfield

## 2015-12-12 NOTE — Consult Note (Signed)
[facility]  CONSULT NOTE    Date: 12/12/2015                  Patient Name:  Alyssa Landry  MRN: [id number]  DOB: Jul 27, 1918  Age / Sex: 82 y.o., female         PCP: Rafael Bihari, MD                 Service Requesting Consult: Dr. Amado Coe                 Reason for Consult: Hyponatremia            History of Present Illness: Alyssa Landry is a 82 y.o. white female with hypertension, COPD, TIA, hyperlipidemia, anxiety, hypothyroidism, atrial fibrillation, pulmonary fibrosis, hysterectomy, who was admitted to [facility] on 12/11/2015 for Hyponatremia [E87.1] Dyspnea [R06.00] Community acquired pneumonia [J18.9] Atrial fibrillation, unspecified type [facility]) [I48.91]  Patient admitted to ICU and placed on BIPAP. She was transitioned to nasal canula this morning and transferred to 2A. Daughter at bedside who assists with history taking. Patient has a history of SIADH and is on sodium tablets but is not on a fluid restriction. Basline sodium of 133 from 10/29/15.   Sodium today is 117.    Medications: Outpatient medications: Prescriptions prior to admission  Medication Sig Dispense Refill Last Dose  . albuterol (PROVENTIL HFA;VENTOLIN HFA) 108 (90 BASE) MCG/ACT inhaler Inhale 1-2 puffs into the lungs every 4 (four) hours as needed for wheezing or shortness of breath.   12/11/2015 at Unknown time  . amLODipine (NORVASC) 10 MG tablet Take 10 mg by mouth daily.   12/11/2015 at Unknown time  . aspirin EC 81 MG tablet Take 81 mg by mouth every evening.    12/10/2015 at 1800  . benzonatate (TESSALON) 100 MG capsule Take 100 mg by mouth 3 (three) times daily as needed for cough.   12/11/2015 at Unknown time  . cefUROXime (CEFTIN) 250 MG tablet Take 250 mg by mouth 2 (two) times daily with a meal.   12/11/2015 at Unknown time  . cholecalciferol (VITAMIN D) 1000 UNITS tablet Take 1,000 Units by mouth daily.   12/11/2015 at Unknown time  . cloNIDine (CATAPRES) 0.1 MG  tablet Take 0.1 mg by mouth 3 (three) times daily.   12/11/2015 at Unknown time  . clopidogrel (PLAVIX) 75 MG tablet Take 75 mg by mouth daily.   12/11/2015 at 0900  . docusate sodium (COLACE) 100 MG capsule Take 100 mg by mouth 2 (two) times daily.    12/11/2015 at Unknown time  . feeding supplement, ENSURE ENLIVE, (ENSURE ENLIVE) LIQD Take 237 mLs by mouth daily.    12/11/2015 at Unknown time  . ferrous sulfate 325 (65 FE) MG tablet Take 325 mg by mouth 2 (two) times daily with a meal.    12/11/2015 at Unknown time  . fluticasone (FLONASE) 50 MCG/ACT nasal spray Place 2 sprays into both nostrils daily.    12/11/2015 at Unknown time  . fluticasone (FLOVENT HFA) 110 MCG/ACT inhaler Inhale 2 puffs into the lungs 2 (two) times daily.   12/11/2015 at Unknown time  . levothyroxine (SYNTHROID, LEVOTHROID) 100 MCG tablet Take 100 mcg by mouth daily before breakfast.   12/11/2015 at Unknown time  . LORazepam (ATIVAN) 0.5 MG tablet Take 0.5 mg by mouth 3 (three) times daily.    12/11/2015 at Unknown time  . losartan (COZAAR) 50 MG tablet Take 50 mg by  mouth daily.   12/11/2015 at Unknown time  . Multiple Vitamin (MULTIVITAMIN WITH MINERALS) TABS tablet Take 1 tablet by mouth daily.   12/11/2015 at Unknown time  . Oxybutynin Chloride 10 % GEL Place 1 application onto the skin at bedtime.   12/10/2015 at Unknown time  . polyethylene glycol (MIRALAX / GLYCOLAX) packet Take 17 g by mouth daily.   12/11/2015 at Unknown time  . ranitidine (ZANTAC) 150 MG tablet Take 150 mg by mouth 2 (two) times daily before a meal.    12/11/2015 at Unknown time  . sodium bicarbonate 325 MG tablet Take 325 mg by mouth 4 (four) times daily.   12/11/2015 at Unknown time  . tiotropium (SPIRIVA) 18 MCG inhalation capsule Place 18 mcg into inhaler and inhale daily.   12/11/2015 at Unknown time  . vitamin B-12 (CYANOCOBALAMIN) 1000 MCG tablet Take 1,000 mcg by mouth daily.   12/11/2015 at Unknown time    Current medications: Current  Facility-Administered Medications  Medication Dose Route Frequency Provider Last Rate Last Dose  . albuterol (PROVENTIL) (2.5 MG/3ML) 0.083% nebulizer solution 2.5 mg  2.5 mg Inhalation Q4H PRN Ramonita Lab, MD      . amLODipine (NORVASC) tablet 10 mg  10 mg Oral Daily Ramonita Lab, MD   10 mg at 12/12/15 0829  . aspirin EC tablet 81 mg  81 mg Oral QPM Ramonita Lab, MD   81 mg at 12/11/15 2208  . benzonatate (TESSALON) capsule 100 mg  100 mg Oral TID PRN Ramonita Lab, MD   100 mg at 12/11/15 2209  . budesonide (PULMICORT) nebulizer solution 0.25 mg  0.25 mg Nebulization BID Ramonita Lab, MD   0.25 mg at 12/12/15 0802  . cholecalciferol (VITAMIN D) tablet 1,000 Units  1,000 Units Oral Daily Ramonita Lab, MD   1,000 Units at 12/12/15 0828  . cloNIDine (CATAPRES) tablet 0.1 mg  0.1 mg Oral TID Ramonita Lab, MD   0.1 mg at 12/12/15 5643  . clopidogrel (PLAVIX) tablet 75 mg  75 mg Oral Daily Ramonita Lab, MD   75 mg at 12/12/15 0828  . diltiazem (CARDIZEM) injection 5 mg  5 mg Intravenous Q6H PRN Ramonita Lab, MD      . docusate sodium (COLACE) capsule 100 mg  100 mg Oral BID Ramonita Lab, MD   100 mg at 12/12/15 0828  . enoxaparin (LOVENOX) injection 40 mg  40 mg Subcutaneous Q24H Ramonita Lab, MD   40 mg at 12/11/15 2208  . famotidine (PEPCID) tablet 10 mg  10 mg Oral Daily Ramonita Lab, MD   10 mg at 12/12/15 0828  . feeding supplement (ENSURE ENLIVE) (ENSURE ENLIVE) liquid 237 mL  237 mL Oral Daily Ramonita Lab, MD   237 mL at 12/11/15 2137  . ferrous sulfate tablet 325 mg  325 mg Oral BID WC Ramonita Lab, MD   325 mg at 12/12/15 0828  . fluticasone (FLONASE) 50 MCG/ACT nasal spray 2 spray  2 spray Each Nare Daily Ramonita Lab, MD   2 spray at 12/12/15 0944  . guaiFENesin-dextromethorphan (ROBITUSSIN DM) 100-10 MG/5ML syrup 5 mL  5 mL Oral Q4H PRN Aruna Gouru, MD      . ipratropium-albuterol (DUONEB) 0.5-2.5 (3) MG/3ML nebulizer solution 3 mL  3 mL Nebulization Q6H Ramonita Lab, MD   3 mL at 12/12/15 0802  .  levalbuterol (XOPENEX) nebulizer solution 1.25 mg  1.25 mg Nebulization Q6H PRN Ramonita Lab, MD      . Levofloxacin (LEVAQUIN) IVPB 250 mg  250 mg Intravenous Q24H Ramonita LabAruna Gouru, MD      . levothyroxine (SYNTHROID, LEVOTHROID) tablet 100 mcg  100 mcg Oral QAC breakfast Ramonita LabAruna Gouru, MD   100 mcg at 12/12/15 0828  . LORazepam (ATIVAN) tablet 0.5 mg  0.5 mg Oral TID Ramonita LabAruna Gouru, MD   0.5 mg at 12/12/15 0829  . losartan (COZAAR) tablet 50 mg  50 mg Oral Daily Ramonita LabAruna Gouru, MD   50 mg at 12/12/15 0828  . methylPREDNISolone sodium succinate (SOLU-MEDROL) 125 mg/2 mL injection 60 mg  60 mg Intravenous Q6H Ramonita LabAruna Gouru, MD   60 mg at 12/12/15 0829  . multivitamin with minerals tablet 1 tablet  1 tablet Oral Daily Ramonita LabAruna Gouru, MD   1 tablet at 12/12/15 0828  . ondansetron (ZOFRAN) tablet 4 mg  4 mg Oral Q6H PRN Ramonita LabAruna Gouru, MD       Or  . ondansetron (ZOFRAN) injection 4 mg  4 mg Intravenous Q6H PRN Ramonita LabAruna Gouru, MD   4 mg at 12/12/15 0249  . Oxybutynin Chloride 10 % GEL 1 application  1 application Transdermal QHS Ramonita LabAruna Gouru, MD   1 application at 12/12/15 0101  . polyethylene glycol (MIRALAX / GLYCOLAX) packet 17 g  17 g Oral Daily Ramonita LabAruna Gouru, MD   17 g at 12/12/15 0829  . sodium bicarbonate tablet 325 mg  325 mg Oral QID Ramonita LabAruna Gouru, MD   325 mg at 12/12/15 0828  . sodium chloride flush (NS) 0.9 % injection 3 mL  3 mL Intravenous Q12H Ramonita LabAruna Gouru, MD   3 mL at 12/12/15 1000  . tiotropium (SPIRIVA) inhalation capsule 18 mcg  18 mcg Inhalation Daily Ramonita LabAruna Gouru, MD   18 mcg at 12/12/15 0944  . vitamin B-12 (CYANOCOBALAMIN) tablet 1,000 mcg  1,000 mcg Oral Daily Ramonita LabAruna Gouru, MD   1,000 mcg at 12/12/15 [id number]      Allergies: Allergies  Allergen Reactions  . Codeine Other (See Comments)    Reaction:  Unknown   . Ditropan [Oxybutynin] Other (See Comments)    Reaction:  Unknown   . Hctz [Hydrochlorothiazide] Other (See Comments)    Reaction:  Causes pts sodium/potassium levels to drop significantly   .  Metronidazole Other (See Comments)    Reaction:  Unknown   . Propranolol Other (See Comments)    Reaction:  Unknown   . Tavist [Clemastine] Other (See Comments)    Reaction:  Unknown   . Tramadol Other (See Comments)    Reaction:  Unknown   . Vicodin [Hydrocodone-Acetaminophen] Other (See Comments)    Reaction:  Unknown       Past Medical History: Past Medical History  Diagnosis Date  . Hypertension   . COPD (chronic obstructive pulmonary disease) (HCC)   . TIA (transient ischemic attack)   . Acid reflux   . Arthritis   . Hyperlipidemia   . Anxiety   . Hypothyroidism   . Hiatal hernia   . Asthma   . Incontinence   . Osteoporosis, post-menopausal   . Chronic hyponatremia   . SIADH (syndrome of inappropriate ADH production) (HCC)   . GERD (gastroesophageal reflux disease)   . Hiatal hernia   . Migraine   . Anemia   . Pulmonary fibrosis (HCC)   . Persistent cough   . A-fib [facility](HCC)      Past Surgical History: Past Surgical History  Procedure Laterality Date  . Abdominal hysterectomy  1966     Family History: Family History  Problem Relation Age of  Onset  . Leukemia Mother   . Heart disease Father   . Hypertension       Social History: Social History   Social History  . Marital Status: Widowed    Spouse Name: N/A  . Number of Children: N/A  . Years of Education: N/A   Occupational History  . Not on file.   Social History Main Topics  . Smoking status: Never Smoker   . Smokeless tobacco: Not on file  . Alcohol Use: No  . Drug Use: No  . Sexual Activity: Not on file   Other Topics Concern  . Not on file   Social History Narrative     Review of Systems: Review of Systems  Constitutional: Positive for fever, chills, malaise/fatigue and diaphoresis. Negative for weight loss.  HENT: Negative for congestion, ear discharge, ear pain, hearing loss, nosebleeds, sore throat and tinnitus.   Eyes: Negative for blurred vision, double vision,  photophobia, pain, discharge and redness.  Respiratory: Positive for cough, sputum production, shortness of breath and wheezing. Negative for hemoptysis and stridor.   Cardiovascular: Negative for chest pain, palpitations, orthopnea, claudication, leg swelling and PND.  Gastrointestinal: Negative for heartburn, nausea, vomiting, abdominal pain, diarrhea, constipation, blood in stool and melena.  Genitourinary: Negative for dysuria, urgency, frequency, hematuria and flank pain.  Musculoskeletal: Negative for myalgias, back pain, joint pain, falls and neck pain.  Skin: Negative for itching and rash.  Neurological: Positive for weakness. Negative for dizziness, tingling, tremors, sensory change, speech change, focal weakness, seizures, loss of consciousness and headaches.  Endo/Heme/Allergies: Negative for environmental allergies and polydipsia. Does not bruise/bleed easily.  Psychiatric/Behavioral: Negative for depression, suicidal ideas, hallucinations, memory loss and substance abuse. The patient is not nervous/anxious and does not have insomnia.     Vital Signs: Blood pressure 164/84, pulse 104, temperature 97.6 F (36.4 C), temperature source Oral, resp. rate 21, height 5\' 2"  (1.575 m), weight 50.5 kg (111 lb 5.3 oz), SpO2 97 %.  Weight trends: Filed Weights   12/11/15 1409 12/11/15 2100  Weight: 48.988 kg (108 lb) 50.5 kg (111 lb 5.3 oz)    Physical Exam: General: NAD, sitting up in bed  Head: Normocephalic, atraumatic. Moist oral mucosal membranes  Eyes: Anicteric, PERRL  Neck: Supple, trachea midline  Lungs:  Bilateral wheezes  Heart: irregular  Abdomen:  Soft, nontender  Extremities: no peripheral edema.  Neurologic: Nonfocal, moving all four extremities  Skin: No lesions        Lab results: Basic Metabolic Panel:  Recent Labs Lab 12/11/15 2142 12/12/15 0331 12/12/15 0929  NA 117* 117* 117*  K 4.6 4.5 4.7  CL 83* 83* 82*  CO2 23 25 25   GLUCOSE 192* 162* 160*   BUN 13 13 13   CREATININE 0.53 0.46 0.45  CALCIUM 8.5* 8.8* 9.0    Liver Function Tests:  Recent Labs Lab 12/11/15 1439  AST 29  ALT 25  ALKPHOS 121  BILITOT 0.9  PROT 7.3  ALBUMIN 3.8   No results for input(s): LIPASE, AMYLASE in the last 168 hours. No results for input(s): AMMONIA in the last 168 hours.  CBC:  Recent Labs Lab 12/11/15 1439 12/12/15 0331  WBC 11.2* 8.8  HGB 10.9* 10.7*  HCT 32.1* 29.8*  MCV 87.1 87.0  PLT 300 333    Cardiac Enzymes:  Recent Labs Lab 12/11/15 1439  TROPONINI <0.03    BNP: Invalid input(s): POCBNP  CBG:  Recent Labs Lab 12/11/15 2047  GLUCAP 210*  Microbiology: Results for orders placed or performed during the hospital encounter of 12/11/15  MRSA PCR Screening     Status: None   Collection Time: 12/11/15  9:17 PM  Result Value Ref Range Status   MRSA by PCR NEGATIVE NEGATIVE Final    Comment:        The GeneXpert MRSA Assay (FDA approved for NASAL specimens only), is one component of a comprehensive MRSA colonization surveillance program. It is not intended to diagnose MRSA infection nor to guide or monitor treatment for MRSA infections.     Coagulation Studies: No results for input(s): LABPROT, INR in the last 72 hours.  Urinalysis: No results for input(s): COLORURINE, LABSPEC, PHURINE, GLUCOSEU, HGBUR, BILIRUBINUR, KETONESUR, PROTEINUR, UROBILINOGEN, NITRITE, LEUKOCYTESUR in the last 72 hours.  Invalid input(s): APPERANCEUR    Imaging: Dg Chest Portable 1 View  12/11/2015  CLINICAL DATA:  Worsening shortness of breath. History of pulmonary fibrosis. EXAM: PORTABLE CHEST 1 VIEW COMPARISON:  06/26/2015 FINDINGS: Increased densities at the costophrenic angles bilaterally, left side greater than right. Findings are suggestive for pleural effusions. Upper lungs are clear. Cardiac silhouette is within normal limits and stable. Atherosclerotic calcifications at the aortic arch. Increased densities along  the medial right lung base. IMPRESSION: Bibasilar chest densities, left side greater than right. Findings may be associated with pleural effusions but cannot exclude airspace disease. Electronically Signed   By: Richarda Overlie M.D.   On: 12/11/2015 16:11      Assessment & Plan: Ms. CRESTA RIDEN is a 82 y.o. white female with hypertension, COPD, TIA, hyperlipidemia, anxiety, hypothyroidism, atrial fibrillation, pulmonary fibrosis, hysterectomy, who was admitted to [facility] on 12/11/2015 for Hyponatremia [E87.1]  1. Hyponatremia: acute on chronic hyponatremia: history of SIADH as per chart. Baseline sodium of 133 from 10/29/2015.  - Start fluid restriction - Continue salt tablets (sodium bicarbonate) - may need to change to sodium chloride.  - serial sodium checks - Check serum and urine osm.   2. Hypertension and atrial fibrillation: blood pressure elevated, a-fib with RVR.  - amlodipine, clonidine, losartan - rate control as per primary.   3. Anemia: hemoglobin at 10.7. Normocytic.    LOS: 1 Nikoli Nasser 6/1/201710:29 AM

## 2015-12-12 NOTE — Progress Notes (Signed)
CRITICAL VALUE ALERT  Critical value received:  Osmolality and Sodium  Date of notification:  12/12/15  Time of notification:  1215  Critical value read back: Yes  Nurse who received alert:  Vilinda BlanksBrandi Mansfield, RN  MD notified (1st page):  Kolluru  Time MD responded:  [id number]  Athony Coppa R Mansfield

## 2015-12-12 NOTE — Progress Notes (Signed)
[facility] - [place] at [facility]   PATIENT NAME: Alyssa Landry    MR#:  [id number]  DATE OF BIRTH:  May 07, 1919  SUBJECTIVE:  Admitted  for COPD exacerbation, hyponatremia. /afib.Patient feels better.   CHIEF COMPLAINT:   Chief Complaint  Patient presents with  . Shortness of Breath    REVIEW OF SYSTEMS:   ROS CONSTITUTIONAL: No fever, fatigue or weakness.  EYES: No blurred or double vision.  EARS, NOSE, AND THROAT: No tinnitus or ear pain.  RESPIRATORY: No cough, shortness of breath, wheezing or hemoptysis.  CARDIOVASCULAR: No chest pain, orthopnea, edema.  GASTROINTESTINAL: No nausea, vomiting, diarrhea or abdominal pain.  GENITOURINARY: No dysuria, hematuria.  ENDOCRINE: No polyuria, nocturia,  HEMATOLOGY: No anemia, easy bruising or bleeding SKIN: No rash or lesion. MUSCULOSKELETAL: No joint pain or arthritis.   NEUROLOGIC: No tingling, numbness, weakness.  PSYCHIATRY: No anxiety or depression.   DRUG ALLERGIES:   Allergies  Allergen Reactions  . Codeine Other (See Comments)    Reaction:  Unknown   . Ditropan [Oxybutynin] Other (See Comments)    Reaction:  Unknown   . Hctz [Hydrochlorothiazide] Other (See Comments)    Reaction:  Causes pts sodium/potassium levels to drop significantly   . Metronidazole Other (See Comments)    Reaction:  Unknown   . Propranolol Other (See Comments)    Reaction:  Unknown   . Tavist [Clemastine] Other (See Comments)    Reaction:  Unknown   . Tramadol Other (See Comments)    Reaction:  Unknown   . Vicodin [Hydrocodone-Acetaminophen] Other (See Comments)    Reaction:  Unknown     VITALS:  Blood pressure 143/63, pulse 71, temperature 98.6 F (37 C), temperature source Oral, resp. rate 1, height  (1.575 m), weight 50.5 kg (111 lb 5.3 oz), SpO2 97 %.  PHYSICAL EXAMINATION:  GENERAL:  82 y.o.-year-old patient lying in the bed with no acute distress.  EYES: Pupils equal, round, reactive to light  and accommodation. No scleral icterus. Extraocular muscles intact.  HEENT: Head atraumatic, normocephalic. Oropharynx and nasopharynx clear.  NECK:  Supple, no jugular venous distention. No thyroid enlargement, no tenderness.  LUNGS: Normal breath sounds bilaterally, mild wheezing  Bilaterally.no rales,rhonchi or crepitation. No use of accessory muscles of respiration.  CARDIOVASCULAR: S1, S2 normal. No murmurs, rubs, or gallops.  ABDOMEN: Soft, nontender, nondistended. Bowel sounds present. No organomegaly or mass.  EXTREMITIES: No pedal edema, cyanosis, or clubbing.  NEUROLOGIC: Cranial nerves II through XII are intact. Muscle strength 5/5 in all extremities. Sensation intact. Gait not checked.  PSYCHIATRIC: The patient is alert and oriented x 3.  SKIN: No obvious rash, lesion, or ulcer.    LABORATORY PANEL:   CBC  Recent Labs Lab 12/12/15 0331  WBC 8.8  HGB 10.7*  HCT 29.8*  PLT 333   ------------------------------------------------------------------------------------------------------------------  Chemistries   Recent Labs Lab 12/11/15 1439  12/12/15 0929 12/12/15 1049  NA 118*  < > 117* 116*  K 4.2  < > 4.7  --   CL 81*  < > 82*  --   CO2 27  < > 25  --   GLUCOSE 135*  < > 160*  --   BUN 11  < > 13  --   CREATININE 0.42*  < > 0.45  --   CALCIUM 8.8*  < > 9.0  --   AST 29  --   --   --   ALT 25  --   --   --  ALKPHOS 121  --   --   --   BILITOT 0.9  --   --   --   < > = values in this interval not displayed. ------------------------------------------------------------------------------------------------------------------  Cardiac Enzymes  Recent Labs Lab 12/11/15 1439  TROPONINI <0.03   ------------------------------------------------------------------------------------------------------------------  RADIOLOGY:  Dg Chest Portable 1 View  12/11/2015  CLINICAL DATA:  Worsening shortness of breath. History of pulmonary fibrosis. EXAM: PORTABLE CHEST 1 VIEW  COMPARISON:  06/26/2015 FINDINGS: Increased densities at the costophrenic angles bilaterally, left side greater than right. Findings are suggestive for pleural effusions. Upper lungs are clear. Cardiac silhouette is within normal limits and stable. Atherosclerotic calcifications at the aortic arch. Increased densities along the medial right lung base. IMPRESSION: Bibasilar chest densities, left side greater than right. Findings may be associated with pleural effusions but cannot exclude airspace disease. Electronically Signed   By: Richarda OverlieAdam  Henn M.D.   On: 12/11/2015 16:11    EKG:   Orders placed or performed during the hospital encounter of 12/11/15  . EKG 12-Lead  . EKG 12-Lead    ASSESSMENT AND PLAN:  .acute Respiratory failure secondary to COPD exacerbation: Clinical improvement. Continue Solu-Medrol, Levaquin, nebulizers. #2 atrial fibrillation with RVR: Due to COPD flare. Rate is controlled at this time. High risk for anticoagulation because of advanced age. Continue aspirin. #3 severe hyponatremia acute on chronic. Seen by nephrology. Continue fluid restriction. No further medications are ordered by nephrology, received 1 L of fluid bolus and continue sodium tablets.  #4. CODE STATUS DO NOT RESUSCITATE  discussed with patient's daughter.     All the records are reviewed and case discussed with Care Management/Social Workerr. Management plans discussed with the patient, family and they are in agreement.  CODE STATUS: DNR TOTAL TIME TAKING CARE OF THIS PATIENT: 35 minutes.   POSSIBLE D/C IN  1-2  DAYS, DEPENDING ON CLINICAL CONDITION.   Katha HammingKONIDENA,Lillyen Schow M.D on 12/12/2015 at 12:56 PM  Between 7am to 6pm - Pager - [phone]  After 6pm go to [email or web address] - password [email or web address]  [facility]  Office  [phone]  CC: Primary care physician; Rafael BihariWALKER III, JOHN B, MD   Note: This dictation was prepared with Dragon dictation along with smaller phrase technology.  Any transcriptional errors that result from this process are unintentional.

## 2015-12-12 NOTE — Progress Notes (Signed)
Patient moved to room 244 by bed with Annice PihJackie, NT.  Patient moved on O2 and tele monitor. Family at side when leaving ICU.  Patient alert with no distress noted when leaving ICU.  Report called by Amada Jupiterale, RN prior to patient being moved.

## 2015-12-13 LAB — BASIC METABOLIC PANEL
ANION GAP: 9 (ref 5–15)
BUN: 18 mg/dL (ref 6–20)
CHLORIDE: 83 mmol/L — AB (ref 101–111)
CO2: 28 mmol/L (ref 22–32)
Calcium: 8.7 mg/dL — ABNORMAL LOW (ref 8.9–10.3)
Creatinine, Ser: 0.43 mg/dL — ABNORMAL LOW (ref 0.44–1.00)
GFR calc Af Amer: >60 mL/min (ref >60)
Glucose, Bld: 139 mg/dL — ABNORMAL HIGH (ref 65–99)
POTASSIUM: 5 mmol/L (ref 3.5–5.1)
SODIUM: 120 mmol/L — AB (ref 135–145)

## 2015-12-13 LAB — SODIUM
SODIUM: 117 mmol/L — AB (ref 135–145)
SODIUM: 117 mmol/L — AB (ref 135–145)
SODIUM: 118 mmol/L — AB (ref 135–145)
Sodium: 117 mmol/L — CL (ref 135–145)

## 2015-12-13 MED ORDER — FUROSEMIDE 20 MG PO TABS
20.0000 mg | ORAL_TABLET | Freq: Every day | ORAL | Status: DC
  Administered 2015-12-13 – 2015-12-15 (×3): 20 mg via ORAL
  Filled 2015-12-13 (×3): qty 1

## 2015-12-13 MED ORDER — CALCIUM CARBONATE ANTACID 500 MG PO CHEW
1.0000 | CHEWABLE_TABLET | Freq: Four times a day (QID) | ORAL | Status: DC | PRN
  Administered 2015-12-13 – 2015-12-15 (×4): 200 mg via ORAL
  Filled 2015-12-13 (×5): qty 1

## 2015-12-13 MED ORDER — DOCUSATE SODIUM 100 MG PO CAPS: 100.0000 mg | ORAL_CAPSULE | Freq: Two times a day (BID) | ORAL | Status: DC | PRN

## 2015-12-13 MED ORDER — METHYLPREDNISOLONE SODIUM SUCC 125 MG IJ SOLR
60.0000 mg | Freq: Three times a day (TID) | INTRAMUSCULAR | Status: DC
  Administered 2015-12-13 – 2015-12-14 (×2): 60 mg via INTRAVENOUS
  Filled 2015-12-13 (×3): qty 2

## 2015-12-13 NOTE — Progress Notes (Signed)
[facility] - [place] at [facility]   PATIENT NAME: Alyssa Landry    MR#:  [id number]  DATE OF BIRTH:  1919/05/15  SUBJECTIVE:  Admitted  for COPD exacerbation,/hyponatremia.she says she feels better today.no cough.  CHIEF COMPLAINT:   Chief Complaint  Patient presents with  . Shortness of Breath    REVIEW OF SYSTEMS:   ROS CONSTITUTIONAL: No fever, fatigue or weakness.  EYES: No blurred or double vision.  EARS, NOSE, AND THROAT: No tinnitus or ear pain.  RESPIRATORY: No cough, shortness of breath, wheezing or hemoptysis.  CARDIOVASCULAR: No chest pain, orthopnea, edema.  GASTROINTESTINAL: No nausea, vomiting, diarrhea or abdominal pain.  GENITOURINARY: No dysuria, hematuria.  ENDOCRINE: No polyuria, nocturia,  HEMATOLOGY: No anemia, easy bruising or bleeding SKIN: No rash or lesion. MUSCULOSKELETAL: No joint pain or arthritis.   NEUROLOGIC: No tingling, numbness, weakness.  PSYCHIATRY: No anxiety or depression.   DRUG ALLERGIES:   Allergies  Allergen Reactions  . Codeine Other (See Comments)    Reaction:  Unknown   . Ditropan [Oxybutynin] Other (See Comments)    Reaction:  Unknown   . Hctz [Hydrochlorothiazide] Other (See Comments)    Reaction:  Causes pts sodium/potassium levels to drop significantly   . Metronidazole Other (See Comments)    Reaction:  Unknown   . Propranolol Other (See Comments)    Reaction:  Unknown   . Tavist [Clemastine] Other (See Comments)    Reaction:  Unknown   . Tramadol Other (See Comments)    Reaction:  Unknown   . Vicodin [Hydrocodone-Acetaminophen] Other (See Comments)    Reaction:  Unknown     VITALS:  Blood pressure 152/50, pulse 67, temperature 98.1 F (36.7 C), temperature source Oral, resp. rate 23, height  (1.575 m), weight 50.5 kg (111 lb 5.3 oz), SpO2 85 %.  PHYSICAL EXAMINATION:  GENERAL:  82 y.o.-year-old patient lying in the bed with no acute distress.  EYES: Pupils equal, round,  reactive to light and accommodation. No scleral icterus. Extraocular muscles intact.  HEENT: Head atraumatic, normocephalic. Oropharynx and nasopharynx clear.  NECK:  Supple, no jugular venous distention. No thyroid enlargement, no tenderness.  LUNGS:diffuse decreased breath sounds. mild wheezing  Bilaterally.no rales,rhonchi or crepitation. No use of accessory muscles of respiration.  CARDIOVASCULAR: S1, S2 normal. No murmurs, rubs, or gallops.  ABDOMEN: Soft, nontender, nondistended. Bowel sounds present. No organomegaly or mass.  EXTREMITIES: No pedal edema, cyanosis, or clubbing.  NEUROLOGIC: Cranial nerves II through XII are intact. Muscle strength 5/5 in all extremities. Sensation intact. Gait not checked.  PSYCHIATRIC: The patient is alert and oriented x 3.  SKIN: No obvious rash, lesion, or ulcer.    LABORATORY PANEL:   CBC  Recent Labs Lab 12/12/15 0331  WBC 8.8  HGB 10.7*  HCT 29.8*  PLT 333   ------------------------------------------------------------------------------------------------------------------  Chemistries   Recent Labs Lab 12/11/15 1439  12/13/15 0459 12/13/15 1035  NA 118*  < > 120* 117*  K 4.2  < > 5.0  --   CL 81*  < > 83*  --   CO2 27  < > 28  --   GLUCOSE 135*  < > 139*  --   BUN 11  < > 18  --   CREATININE 0.42*  < > 0.43*  --   CALCIUM 8.8*  < > 8.7*  --   AST 29  --   --   --   ALT 25  --   --   --  ALKPHOS 121  --   --   --   BILITOT 0.9  --   --   --   < > = values in this interval not displayed. ------------------------------------------------------------------------------------------------------------------  Cardiac Enzymes  Recent Labs Lab 12/11/15 1439  TROPONINI <0.03   ------------------------------------------------------------------------------------------------------------------  RADIOLOGY:  No results found.  EKG:   Orders placed or performed during the hospital encounter of 12/11/15  . EKG 12-Lead  . EKG  12-Lead    ASSESSMENT AND PLAN:  .acute Respiratory failure secondary to COPD exacerbation: slow improvement.needs home  o2 as sats dropped to 85%on ambulation. #2 atrial fibrillation with RVR: Due to COPD flare. Rate is controlled at this time. High risk for anticoagulation because of advanced age. Continue aspirin./plavix cardizem. #3 severe hyponatremia acute on chronic. Seen by nephrology. Continue fluid restriction. Started small dose lasix.  #4. CODE STATUS DO NOT RESUSCITATE    6.discussed with patient's daughter. today  7..OAB;continue oxybutin gel,,d/w pharmacy,can her own.   All the records are reviewed and case discussed with Care Management/Social Workerr. Management plans discussed with the patient, family and they are in agreement.  CODE STATUS: DNR TOTAL TIME TAKING CARE OF THIS PATIENT: 35 minutes.   POSSIBLE D/C IN  1-2  DAYS, DEPENDING ON CLINICAL CONDITION.   Katha HammingKONIDENA,Jawanda Passey M.D on 12/13/2015 at 4:36 PM  Between 7am to 6pm - Pager - [phone]  After 6pm go to [email or web address] - password [email or web address]  [facility]  Office  [phone]  CC: Primary care physician; Rafael BihariWALKER III, JOHN B, MD   Note: This dictation was prepared with Dragon dictation along with smaller phrase technology. Any transcriptional errors that result from this process are unintentional.

## 2015-12-13 NOTE — Progress Notes (Signed)
SATURATION QUALIFICATIONS: (This note is used to comply with regulatory documentation for home oxygen)  Patient Saturations on Room Air at Rest = 94%  Patient Saturations on Room Air while Ambulating = 85%  Patient Saturations on 2 Liters of oxygen while Ambulating = 92%  Please briefly explain why patient needs home oxygen: 

## 2015-12-13 NOTE — Clinical Documentation Improvement (Signed)
Internal Medicine  Can the diagnosis of "new Atrial Fibrillation with RVR' be further specified? Please document findings in next progress note. Thank you!   Chronic Atrial fibrillation  Paroxysmal Atrial fibrillation  Permanent Atrial fibrillation  Persistent Atrial fibrillation  Other  Clinically Undetermined  Document any associated diagnoses/conditions.  Supporting Information:  Cardizem IV 5 mg q6h prn; changed to PO 30 mg q6h  Please exercise your independent, professional judgment when responding. A specific answer is not anticipated or expected.  Thank You,  Shellee MiloEileen T Wilmer Berryhill RN, BSN, CCDS Health Information Management  [phone]; Cell: [phone]

## 2015-12-13 NOTE — Progress Notes (Signed)
[facility]  ROUNDING NOTE   Subjective:   Na 120 - furosemide given yesterday.   Daughter at bedside.   Objective:  Vital signs in last 24 hours:  Temp:  [97.7 F (36.5 C)-98.6 F (37 C)] 97.7 F (36.5 C) (06/02 0813) Pulse Rate:  [68-83] 68 (06/02 0813) Resp:  [1-22] 14 (06/02 0813) BP: (127-145)/(53-71) 127/58 mmHg (06/02 0813) SpO2:  [95 %-100 %] 99 % (06/02 0813)  Weight change:  Filed Weights   12/11/15 1409 12/11/15 2100  Weight: 48.988 kg (108 lb) 50.5 kg (111 lb 5.3 oz)    Intake/Output: I/O last 3 completed shifts: In: 7 [IV Piggyback:50] Out: 950 [Urine:950]   Intake/Output this shift:  Total I/O In: 600 [P.O.:600] Out: -   Physical Exam: General: NAD, sitting in bed  Head: Normocephalic, atraumatic. Moist oral mucosal membranes  Eyes: Anicteric, PERRL  Neck: Supple, trachea midline  Lungs:  Clear to auscultation, 2L [place]  Heart: Regular rate and rhythm  Abdomen:  Soft, nontender   Extremities: no peripheral edema.  Neurologic: Nonfocal, moving all four extremities  Skin: No lesions       Basic Metabolic Panel:  Recent Labs Lab 12/11/15 1439 12/11/15 2142 12/12/15 0331 12/12/15 0929 12/12/15 1049 12/12/15 1615 12/12/15 2303 12/13/15 0459  NA 118* 117* 117* 117* 116* 118* 117* 120*  K 4.2 4.6 4.5 4.7  --   --   --  5.0  CL 81* 83* 83* 82*  --   --   --  83*  CO2 27 23 25 25   --   --   --  28  GLUCOSE 135* 192* 162* 160*  --   --   --  139*  BUN 11 13 13 13   --   --   --  18  CREATININE 0.42* 0.53 0.46 0.45  --   --   --  0.43*  CALCIUM 8.8* 8.5* 8.8* 9.0  --   --   --  8.7*    Liver Function Tests:  Recent Labs Lab 12/11/15 1439  AST 29  ALT 25  ALKPHOS 121  BILITOT 0.9  PROT 7.3  ALBUMIN 3.8   No results for input(s): LIPASE, AMYLASE in the last 168 hours. No results for input(s): AMMONIA in the last 168 hours.  CBC:  Recent Labs Lab 12/11/15 1439 12/12/15 0331  WBC 11.2* 8.8  HGB 10.9* 10.7*  HCT  32.1* 29.8*  MCV 87.1 87.0  PLT 300 333    Cardiac Enzymes:  Recent Labs Lab 12/11/15 1439  TROPONINI <0.03    BNP: Invalid input(s): POCBNP  CBG:  Recent Labs Lab 12/11/15 2047  GLUCAP 210*    Microbiology: Results for orders placed or performed during the hospital encounter of 12/11/15  Blood culture (routine x 2)     Status: None (Preliminary result)   Collection Time: 12/11/15  6:10 PM  Result Value Ref Range Status   Specimen Description BLOOD LEFT ASSIST CONTROL  Final   Special Requests BOTTLES DRAWN AEROBIC AND ANAEROBIC  10CC  Final   Culture NO GROWTH 2 DAYS  Final   Report Status PENDING  Incomplete  Blood culture (routine x 2)     Status: None (Preliminary result)   Collection Time: 12/11/15  6:15 PM  Result Value Ref Range Status   Specimen Description BLOOD RIGHT ASSIST CONTROL  Final   Special Requests BOTTLES DRAWN AEROBIC AND ANAEROBIC  15CC  Final   Culture NO GROWTH 2 DAYS  Final   Report Status PENDING  Incomplete  MRSA PCR Screening     Status: None   Collection Time: 12/11/15  9:17 PM  Result Value Ref Range Status   MRSA by PCR NEGATIVE NEGATIVE Final    Comment:        The GeneXpert MRSA Assay (FDA approved for NASAL specimens only), is one component of a comprehensive MRSA colonization surveillance program. It is not intended to diagnose MRSA infection nor to guide or monitor treatment for MRSA infections.     Coagulation Studies: No results for input(s): LABPROT, INR in the last 72 hours.  Urinalysis: No results for input(s): COLORURINE, LABSPEC, PHURINE, GLUCOSEU, HGBUR, BILIRUBINUR, KETONESUR, PROTEINUR, UROBILINOGEN, NITRITE, LEUKOCYTESUR in the last 72 hours.  Invalid input(s): APPERANCEUR    Imaging: Dg Chest Portable 1 View  12/11/2015  CLINICAL DATA:  Worsening shortness of breath. History of pulmonary fibrosis. EXAM: PORTABLE CHEST 1 VIEW COMPARISON:  06/26/2015 FINDINGS: Increased densities at the costophrenic  angles bilaterally, left side greater than right. Findings are suggestive for pleural effusions. Upper lungs are clear. Cardiac silhouette is within normal limits and stable. Atherosclerotic calcifications at the aortic arch. Increased densities along the medial right lung base. IMPRESSION: Bibasilar chest densities, left side greater than right. Findings may be associated with pleural effusions but cannot exclude airspace disease. Electronically Signed   By: Richarda Overlie M.D.   On: 12/11/2015 16:11     Medications:     . aspirin EC  81 mg Oral QPM  . azithromycin  500 mg Oral Daily  . budesonide  0.25 mg Nebulization BID  . cefTRIAXone (ROCEPHIN)  IV  1 g Intravenous Q24H  . cholecalciferol  1,000 Units Oral Daily  . cloNIDine  0.1 mg Oral TID  . clopidogrel  75 mg Oral Daily  . diltiazem  30 mg Oral Q6H  . docusate sodium  100 mg Oral BID  . famotidine  10 mg Oral Daily  . feeding supplement (ENSURE ENLIVE)  237 mL Oral Daily  . ferrous sulfate  325 mg Oral BID WC  . fluticasone  2 spray Each Nare Daily  . furosemide  20 mg Oral Daily  . levothyroxine  100 mcg Oral QAC breakfast  . LORazepam  0.5 mg Oral TID  . losartan  50 mg Oral Daily  . methylPREDNISolone (SOLU-MEDROL) injection  60 mg Intravenous Q6H  . multivitamin with minerals  1 tablet Oral Daily  . Oxybutynin Chloride  1 application Transdermal QHS  . polyethylene glycol  17 g Oral Daily  . sodium bicarbonate  325 mg Oral QID  . sodium chloride flush  3 mL Intravenous Q12H  . tiotropium  18 mcg Inhalation Daily  . vitamin B-12  1,000 mcg Oral Daily   albuterol, benzonatate, diltiazem, guaiFENesin-dextromethorphan, levalbuterol, ondansetron **OR** ondansetron (ZOFRAN) IV  Assessment/ Plan:  Alyssa Landry is a 82 y.o. white female with hypertension, COPD, TIA, hyperlipidemia, anxiety, hypothyroidism, atrial fibrillation, pulmonary fibrosis, hysterectomy, who was admitted to [facility] on 12/11/2015 for Hyponatremia  [E87.1]  1. Hyponatremia: acute on chronic hyponatremia: history of SIADH as per chart. Baseline sodium of 133 from 10/29/2015.  - fluid restriction - Continue salt tablets (sodium bicarbonate) - may need to change to sodium chloride.  - serial sodium checks - Start furosemide.   2. Hypertension and atrial fibrillation: blood pressure elevated, a-fib with RVR.  - amlodipine, clonidine, losartan - rate control as per primary.   3. Anemia: hemoglobin at 10.7. Normocytic.  LOS: 2 Jeilyn Reznik 6/2/201711:16 AM

## 2015-12-13 NOTE — Progress Notes (Addendum)
Notified MD of critical sodium 117; no new orders given at this time. Nursing staff will continue to monitor. Lamonte RicherKara A Thos Matsumoto, RN

## 2015-12-13 NOTE — Care Management (Signed)
Admitted with new onset afib.  Lives at home with daughter.  Patient has been on acute O2 while in the hospital.  Patient has bene wean to RA.  Patient to be ambulated to check exertional saturations.  Reported that patient has no mobility needs.  No RNCM needs identified at this time.

## 2015-12-14 LAB — SODIUM
SODIUM: 120 mmol/L — AB (ref 135–145)
SODIUM: 121 mmol/L — AB (ref 135–145)
SODIUM: 120 mmol/L — AB (ref 135–145)

## 2015-12-14 LAB — BASIC METABOLIC PANEL
ANION GAP: 10 (ref 5–15)
BUN: 21 mg/dL — ABNORMAL HIGH (ref 6–20)
CALCIUM: 8.8 mg/dL — AB (ref 8.9–10.3)
CHLORIDE: 83 mmol/L — AB (ref 101–111)
CO2: 28 mmol/L (ref 22–32)
CREATININE: 0.42 mg/dL — AB (ref 0.44–1.00)
GFR calc non Af Amer: >60 mL/min (ref >60)
Glucose, Bld: 127 mg/dL — ABNORMAL HIGH (ref 65–99)
Potassium: 5 mmol/L (ref 3.5–5.1)
SODIUM: 121 mmol/L — AB (ref 135–145)

## 2015-12-14 MED ORDER — LORAZEPAM 0.5 MG PO TABS
0.5000 mg | ORAL_TABLET | Freq: Four times a day (QID) | ORAL | Status: DC | PRN
  Administered 2015-12-14: 0.5 mg via ORAL
  Filled 2015-12-14: qty 1

## 2015-12-14 MED ORDER — METHYLPREDNISOLONE SODIUM SUCC 125 MG IJ SOLR
60.0000 mg | INTRAMUSCULAR | Status: DC
  Administered 2015-12-15: 60 mg via INTRAVENOUS
  Filled 2015-12-14: qty 2

## 2015-12-14 NOTE — Progress Notes (Signed)
SATURATION QUALIFICATIONS: (This note is used to comply with regulatory documentation for home oxygen)  Patient Saturations on Room Air at Rest = 95%  Patient Saturations on Room Air while Ambulating = 97%  Patient Saturations on  Liters of oxygen while Ambulating = %  Please briefly explain why patient needs home oxygen: patient does not qualify

## 2015-12-14 NOTE — Progress Notes (Signed)
[facility] [place] [facility]   PATIENT NAME: Alyssa Dachnnie Elison    MRN#:  [id number]  DATE OF BIRTH:  1918/11/29  SUBJECTIVE:  Hospital Day: 3 days Alyssa Landry is a 82 y.o. female presenting with Shortness of Breath .   Overnight events: No overnight events Interval Events: Patient states "I don't feel well" unable to further explain denies shortness of breath/chest pain  REVIEW OF SYSTEMS:  CONSTITUTIONAL: No fever, fatigue or weakness.  EYES: No blurred or double vision.  EARS, NOSE, AND THROAT: No tinnitus or ear pain.  RESPIRATORY: No cough, shortness of breath, wheezing or hemoptysis.  CARDIOVASCULAR: No chest pain, orthopnea, edema.  GASTROINTESTINAL: No nausea, vomiting, diarrhea or abdominal pain.  GENITOURINARY: No dysuria, hematuria.  ENDOCRINE: No polyuria, nocturia,  HEMATOLOGY: No anemia, easy bruising or bleeding SKIN: No rash or lesion. MUSCULOSKELETAL: No joint pain or arthritis.   NEUROLOGIC: No tingling, numbness, weakness.  PSYCHIATRY: No anxiety or depression.   DRUG ALLERGIES:   Allergies  Allergen Reactions  . Codeine Other (See Comments)    Reaction:  Unknown   . Ditropan [Oxybutynin] Other (See Comments)    Reaction:  Unknown   . Hctz [Hydrochlorothiazide] Other (See Comments)    Reaction:  Causes pts sodium/potassium levels to drop significantly   . Metronidazole Other (See Comments)    Reaction:  Unknown   . Propranolol Other (See Comments)    Reaction:  Unknown   . Tavist [Clemastine] Other (See Comments)    Reaction:  Unknown   . Tramadol Other (See Comments)    Reaction:  Unknown   . Vicodin [Hydrocodone-Acetaminophen] Other (See Comments)    Reaction:  Unknown     VITALS:  Blood pressure 157/57, pulse 79, temperature 98 F (36.7 C), temperature source Axillary, resp. rate 18, height 5\' 2"  (1.575 m), weight 111 lb 5.3 oz (50.5 kg), SpO2 93 %.  PHYSICAL EXAMINATION:  VITAL SIGNS: Filed Vitals:   12/14/15 0803 12/14/15 1243  BP:  157/57  Pulse:  79  Temp: 97.1 F (36.2 C) 98 F (36.7 C)  Resp:  18   GENERAL:82 y.o.female currently in no acute distress.  HEAD: Normocephalic, atraumatic.  EYES: Pupils equal, round, reactive to light. Extraocular muscles intact. No scleral icterus.  MOUTH: Moist mucosal membrane. Dentition intact. No abscess noted.  EAR, NOSE, THROAT: Clear without exudates. No external lesions.  NECK: Supple. No thyromegaly. No nodules. No JVD.  PULMONARY: Scattered rhonchi scant expiratory wheeze. No use of accessory muscles, Good respiratory effort. good air entry bilaterally CHEST: Nontender to palpation.  CARDIOVASCULAR: S1 and S2. Irregular rate/rhythm No murmurs, rubs, or gallops. No edema. Pedal pulses 2+ bilaterally.  GASTROINTESTINAL: Soft, nontender, nondistended. No masses. Positive bowel sounds. No hepatosplenomegaly.  MUSCULOSKELETAL: No swelling, clubbing, or edema. Range of motion full in all extremities.  NEUROLOGIC: Cranial nerves II through XII are intact. No gross focal neurological deficits. Sensation intact. Reflexes intact.  SKIN: No ulceration, lesions, rashes, or cyanosis. Skin warm and dry. Turgor intact.  PSYCHIATRIC: Mood, affect within normal limits. The patient is awake, alert and oriented x 3. Insight, judgment intact.      LABORATORY PANEL:   CBC  Recent Labs Lab 12/12/15 0331  WBC 8.8  HGB 10.7*  HCT 29.8*  PLT 333   ------------------------------------------------------------------------------------------------------------------  Chemistries   Recent Labs Lab 12/11/15 1439  12/14/15 0517 12/14/15 1038  NA 118*  < > 121* 120*  K 4.2  < > 5.0  --  CL 81*  < > 83*  --   CO2 27  < > 28  --   GLUCOSE 135*  < > 127*  --   BUN 11  < > 21*  --   CREATININE 0.42*  < > 0.42*  --   CALCIUM 8.8*  < > 8.8*  --   AST 29  --   --   --   ALT 25  --   --   --   ALKPHOS 121  --   --   --   BILITOT 0.9  --   --   --   <  > = values in this interval not displayed. ------------------------------------------------------------------------------------------------------------------  Cardiac Enzymes  Recent Labs Lab 12/11/15 1439  TROPONINI <0.03   ------------------------------------------------------------------------------------------------------------------  RADIOLOGY:  No results found.  EKG:   Orders placed or performed during the hospital encounter of 12/11/15  . EKG 12-Lead  . EKG 12-Lead    ASSESSMENT AND PLAN:   Alyssa Landry is a 82 y.o. female presenting with Shortness of Breath . Admitted 12/11/2015 : Day #: 3 days  .1. Acute on chronic respiratory failure with hypoxia: COPD exacerbation continue breathing treatments, decrease steroids, continue oxygen and wean as tolerated 2. Paroxysmal atrial fibrillation: Currently rate controlled aspirin./plavix, cardizem. 3. hyponatremia acute on chronic. Seen by nephrology. Continue fluid restriction. lasix.    All the records are reviewed and case discussed with Care Management/Social Workerr. Management plans discussed with the patient, family and they are in agreement.  CODE STATUS: dnr TOTAL TIME TAKING CARE OF THIS PATIENT: 28 minutes.   POSSIBLE D/C IN 1-2DAYS, DEPENDING ON CLINICAL CONDITION.   Wendi Lastra,  Mardi Mainland.D on 12/14/2015 at 12:52 PM  Between 7am to 6pm - Pager - [phone]  After 6pm: House Pager: - [phone]  [facility]  Office  [phone]  CC: Primary care physician; Rafael Bihari, MD

## 2015-12-14 NOTE — Progress Notes (Signed)
[facility]  ROUNDING NOTE   Subjective:   Breathing comfortably.   Na 121 (118)  Objective:  Vital signs in last 24 hours:  Temp:  [97.1 F (36.2 C)-98.1 F (36.7 C)] 97.1 F (36.2 C) (06/03 0803) Pulse Rate:  [64-78] 72 (06/03 0756) Resp:  [16-23] 16 (06/03 0756) BP: (128-152)/(36-76) 128/45 mmHg (06/03 0756) SpO2:  [85 %-99 %] 98 % (06/03 0756)  Weight change:  Filed Weights   12/11/15 1409 12/11/15 2100  Weight: 48.988 kg (108 lb) 50.5 kg (111 lb 5.3 oz)    Intake/Output: I/O last 3 completed shifts: In: 890 [P.O.:840; IV Piggyback:50] Out: 800 [Urine:800]   Intake/Output this shift:     Physical Exam: General: NAD, sitting in bed  Head: Normocephalic, atraumatic. Moist oral mucosal membranes  Eyes: Anicteric, PERRL  Neck: Supple, trachea midline  Lungs:  Clear to auscultation, 2L [place]  Heart: Regular rate and rhythm  Abdomen:  Soft, nontender   Extremities: no peripheral edema.  Neurologic: Nonfocal, moving all four extremities  Skin: No lesions       Basic Metabolic Panel:  Recent Labs Lab 12/11/15 2142 12/12/15 0331 12/12/15 0929  12/13/15 0459 12/13/15 1035 12/13/15 1645 12/13/15 2252 12/14/15 0517  NA 117* 117* 117*  < > 120* 117* 117* 118* 121*  K 4.6 4.5 4.7  --  5.0  --   --   --  5.0  CL 83* 83* 82*  --  83*  --   --   --  83*  CO2 --  28  --   --   --  28  GLUCOSE 192* 162* 160*  --  139*  --   --   --  127*  BUN --  18  --   --   --  21*  CREATININE 0.53 0.46 0.45  --  0.43*  --   --   --  0.42*  CALCIUM 8.5* 8.8* 9.0  --  8.7*  --   --   --  8.8*  < > = values in this interval not displayed.  Liver Function Tests:  Recent Labs Lab 12/11/15 1439  AST 29  ALT 25  ALKPHOS 121  BILITOT 0.9  PROT 7.3  ALBUMIN 3.8   No results for input(s): LIPASE, AMYLASE in the last 168 hours. No results for input(s): AMMONIA in the last 168 hours.  CBC:  Recent Labs Lab 12/11/15 1439 12/12/15 0331   WBC 11.2* 8.8  HGB 10.9* 10.7*  HCT 32.1* 29.8*  MCV 87.1 87.0  PLT 300 333    Cardiac Enzymes:  Recent Labs Lab 12/11/15 1439  TROPONINI <0.03    BNP: Invalid input(s): POCBNP  CBG:  Recent Labs Lab 12/11/15 2047  GLUCAP 210*    Microbiology: Results for orders placed or performed during the hospital encounter of 12/11/15  Blood culture (routine x 2)     Status: None (Preliminary result)   Collection Time: 12/11/15  6:10 PM  Result Value Ref Range Status   Specimen Description BLOOD LEFT ASSIST CONTROL  Final   Special Requests BOTTLES DRAWN AEROBIC AND ANAEROBIC  10CC  Final   Culture NO GROWTH 2 DAYS  Final   Report Status PENDING  Incomplete  Blood culture (routine x 2)     Status: None (Preliminary result)   Collection Time: 12/11/15  6:15 PM  Result Value Ref Range Status   Specimen Description BLOOD RIGHT ASSIST CONTROL  Final   Special Requests BOTTLES DRAWN AEROBIC AND ANAEROBIC  15CC  Final   Culture NO GROWTH 2 DAYS  Final   Report Status PENDING  Incomplete  MRSA PCR Screening     Status: None   Collection Time: 12/11/15  9:17 PM  Result Value Ref Range Status   MRSA by PCR NEGATIVE NEGATIVE Final    Comment:        The GeneXpert MRSA Assay (FDA approved for NASAL specimens only), is one component of a comprehensive MRSA colonization surveillance program. It is not intended to diagnose MRSA infection nor to guide or monitor treatment for MRSA infections.     Coagulation Studies: No results for input(s): LABPROT, INR in the last 72 hours.  Urinalysis: No results for input(s): COLORURINE, LABSPEC, PHURINE, GLUCOSEU, HGBUR, BILIRUBINUR, KETONESUR, PROTEINUR, UROBILINOGEN, NITRITE, LEUKOCYTESUR in the last 72 hours.  Invalid input(s): APPERANCEUR    Imaging: No results found.   Medications:     . aspirin EC  81 mg Oral QPM  . azithromycin  500 mg Oral Daily  . budesonide  0.25 mg Nebulization BID  . cefTRIAXone (ROCEPHIN)  IV  1  g Intravenous Q24H  . cholecalciferol  1,000 Units Oral Daily  . cloNIDine  0.1 mg Oral TID  . clopidogrel  75 mg Oral Daily  . diltiazem  30 mg Oral Q6H  . docusate sodium  100 mg Oral BID  . famotidine  10 mg Oral Daily  . feeding supplement (ENSURE ENLIVE)  237 mL Oral Daily  . ferrous sulfate  325 mg Oral BID WC  . fluticasone  2 spray Each Nare Daily  . furosemide  20 mg Oral Daily  . levothyroxine  100 mcg Oral QAC breakfast  . LORazepam  0.5 mg Oral TID  . losartan  50 mg Oral Daily  . methylPREDNISolone (SOLU-MEDROL) injection  60 mg Intravenous Q8H  . multivitamin with minerals  1 tablet Oral Daily  . Oxybutynin Chloride  1 application Transdermal QHS  . polyethylene glycol  17 g Oral Daily  . sodium bicarbonate  325 mg Oral QID  . sodium chloride flush  3 mL Intravenous Q12H  . tiotropium  18 mcg Inhalation Daily  . vitamin B-12  1,000 mcg Oral Daily   benzonatate, calcium carbonate, diltiazem, docusate sodium, guaiFENesin-dextromethorphan, levalbuterol, ondansetron **OR** ondansetron (ZOFRAN) IV  Assessment/ Plan:  Ms. Alyssa Landry is a 82 y.o. white female with hypertension, COPD, TIA, hyperlipidemia, anxiety, hypothyroidism, atrial fibrillation, pulmonary fibrosis, hysterectomy, who was admitted to [facility] on 12/11/2015 for Hyponatremia [E87.1]  1. Hyponatremia: acute on chronic hyponatremia: history of SIADH as per chart and consistent with urine and serum studies.  Hypo-osmolar with Baseline sodium of 133 from 10/29/2015.  - Continue fluid restriction - Continue salt tablets (sodium bicarbonate) - may need to change to sodium chloride.  - serial sodium checks - Continue furosemide.   2. Hypertension and atrial fibrillation: blood pressure elevated, a-fib with RVR on admission.  - diltiazem, clonidine, losartan and furosemide.  - rate controlled   3. Anemia: normocytic  - no indication for epo  4. Acute exacerbation of COPD - empiric antibiotics and  steroids. O2 - continue supportive care.    LOS: 3 Alyssa Landry 6/3/20179:56 AM

## 2015-12-15 LAB — BASIC METABOLIC PANEL
ANION GAP: 8 (ref 5–15)
BUN: 22 mg/dL — ABNORMAL HIGH (ref 6–20)
CALCIUM: 8.7 mg/dL — AB (ref 8.9–10.3)
CO2: 31 mmol/L (ref 22–32)
Chloride: 84 mmol/L — ABNORMAL LOW (ref 101–111)
Creatinine, Ser: 0.45 mg/dL (ref 0.44–1.00)
Glucose, Bld: 95 mg/dL (ref 65–99)
POTASSIUM: 4.7 mmol/L (ref 3.5–5.1)
Sodium: 123 mmol/L — ABNORMAL LOW (ref 135–145)

## 2015-12-15 LAB — SODIUM: SODIUM: 124 mmol/L — AB (ref 135–145)

## 2015-12-15 MED ORDER — PREDNISONE 10 MG (21) PO TBPK: 10.0000 mg | ORAL_TABLET | Freq: Every day | ORAL | Status: DC

## 2015-12-15 MED ORDER — DILTIAZEM HCL ER COATED BEADS 120 MG PO CP24: 120.0000 mg | ORAL_CAPSULE | Freq: Every day | ORAL | Status: AC

## 2015-12-15 MED ORDER — AZITHROMYCIN 250 MG PO TABS
250.0000 mg | ORAL_TABLET | Freq: Every day | ORAL | Status: DC
Start: 2015-12-15 — End: 2016-01-30

## 2015-12-15 NOTE — Discharge Summary (Signed)
[facility] [place] at [facility]   PATIENT NAME: Alyssa Landry    MR#:  [id number]  DATE OF BIRTH:  [birth year]  DATE OF ADMISSION:  12/11/2015 ADMITTING PHYSICIAN: Ramonita Lab, MD  DATE OF DISCHARGE: 12/15/2015  PRIMARY CARE PHYSICIAN: Rafael Bihari, MD    ADMISSION DIAGNOSIS:  Hyponatremia [E87.1] Dyspnea [R06.00] Atrial fibrillation, unspecified type (HCC) [I48.91]  DISCHARGE DIAGNOSIS:  Active Problems:   Atrial fibrillation with RVR (HCC)  COPD exacerbation Hyponatremia  SECONDARY DIAGNOSIS:   Past Medical History  Diagnosis Date  . Hypertension   . COPD (chronic obstructive pulmonary disease) (HCC)   . TIA (transient ischemic attack)   . Acid reflux   . Arthritis   . Hyperlipidemia   . Anxiety   . Hypothyroidism   . Hiatal hernia   . Asthma   . Incontinence   . Osteoporosis, post-menopausal   . Chronic hyponatremia   . SIADH (syndrome of inappropriate ADH production) (HCC)   . GERD (gastroesophageal reflux disease)   . Hiatal hernia   . Migraine   . Anemia   . Pulmonary fibrosis (HCC)   . Persistent cough   . A-fib [facility])     HOSPITAL COURSE:  Alyssa Landry  is a 82 y.o. female admitted 12/11/2015 with chief complaint Shortness of Breath . Please see H&P performed by Ramonita Lab, MD for further information. Patient presented with the above complaint started on steroids and empiric antibiotics supplemental oxygen as required for COPD exacerbation. Her course was complicated by atrial fibrillation rapid ventricular response for which she was started on Cardizem. Slowly her heart rate as well as respiratory status has improved back to baseline upon ambulation her heart rate remained controlled she did not have any issues with desaturations. She was also noted to be hyponatremic during her stay, surprisingly not that far from her baseline. She was evaluated by nephrology who assisted in sodium management.  DISCHARGE CONDITIONS:    Stable/improved  CONSULTS OBTAINED:  Treatment Team:  Lamont Dowdy, MD  DRUG ALLERGIES:   Allergies  Allergen Reactions  . Codeine Other (See Comments)    Reaction:  Unknown   . Ditropan [Oxybutynin] Other (See Comments)    Reaction:  Unknown   . Hctz [Hydrochlorothiazide] Other (See Comments)    Reaction:  Causes pts sodium/potassium levels to drop significantly   . Metronidazole Other (See Comments)    Reaction:  Unknown   . Propranolol Other (See Comments)    Reaction:  Unknown   . Tavist [Clemastine] Other (See Comments)    Reaction:  Unknown   . Tramadol Other (See Comments)    Reaction:  Unknown   . Vicodin [Hydrocodone-Acetaminophen] Other (See Comments)    Reaction:  Unknown     DISCHARGE MEDICATIONS:   Current Discharge Medication List    START taking these medications   Details  azithromycin (ZITHROMAX) 250 MG tablet Take 1 tablet (250 mg total) by mouth daily. Qty: 2 each, Refills: 0    diltiazem (CARDIZEM CD) 120 MG 24 hr capsule Take 1 capsule (120 mg total) by mouth daily. Qty: 30 capsule, Refills: 0    predniSONE (STERAPRED UNI-PAK 21 TAB) 10 MG (21) TBPK tablet Take 1 tablet (10 mg total) by mouth daily. 40mg  oral 1 day, then 20mg  oral for 2 days, then 10mg  oral 2 days, then stop Qty: 10 tablet, Refills: 0      CONTINUE these medications which have NOT CHANGED   Details  albuterol (PROVENTIL  HFA;VENTOLIN HFA) 108 (90 BASE) MCG/ACT inhaler Inhale 1-2 puffs into the lungs every 4 (four) hours as needed for wheezing or shortness of breath.    amLODipine (NORVASC) 10 MG tablet Take 10 mg by mouth daily.    aspirin EC 81 MG tablet Take 81 mg by mouth every evening.     benzonatate (TESSALON) 100 MG capsule Take 100 mg by mouth 3 (three) times daily as needed for cough.    cholecalciferol (VITAMIN D) 1000 UNITS tablet Take 1,000 Units by mouth daily.    cloNIDine (CATAPRES) 0.1 MG tablet Take 0.1 mg by mouth 3 (three) times daily.     clopidogrel (PLAVIX) 75 MG tablet Take 75 mg by mouth daily.    docusate sodium (COLACE) 100 MG capsule Take 100 mg by mouth 2 (two) times daily.     feeding supplement, ENSURE ENLIVE, (ENSURE ENLIVE) LIQD Take 237 mLs by mouth daily.     ferrous sulfate 325 (65 FE) MG tablet Take 325 mg by mouth 2 (two) times daily with a meal.     fluticasone (FLONASE) 50 MCG/ACT nasal spray Place 2 sprays into both nostrils daily.     fluticasone (FLOVENT HFA) 110 MCG/ACT inhaler Inhale 2 puffs into the lungs 2 (two) times daily.    levothyroxine (SYNTHROID, LEVOTHROID) 100 MCG tablet Take 100 mcg by mouth daily before breakfast.    LORazepam (ATIVAN) 0.5 MG tablet Take 0.5 mg by mouth 3 (three) times daily.     losartan (COZAAR) 50 MG tablet Take 50 mg by mouth daily.    Multiple Vitamin (MULTIVITAMIN WITH MINERALS) TABS tablet Take 1 tablet by mouth daily.    Oxybutynin Chloride 10 % GEL Place 1 application onto the skin at bedtime.    polyethylene glycol (MIRALAX / GLYCOLAX) packet Take 17 g by mouth daily.    ranitidine (ZANTAC) 150 MG tablet Take 150 mg by mouth 2 (two) times daily before a meal.     sodium bicarbonate 325 MG tablet Take 325 mg by mouth 4 (four) times daily.    tiotropium (SPIRIVA) 18 MCG inhalation capsule Place 18 mcg into inhaler and inhale daily.    vitamin B-12 (CYANOCOBALAMIN) 1000 MCG tablet Take 1,000 mcg by mouth daily.      STOP taking these medications     cefUROXime (CEFTIN) 250 MG tablet          DISCHARGE INSTRUCTIONS:  Continue with fluid restrictions 1 L daily, to be re-evaluated by nephrology as an outpatient  DIET:  Cardiac diet  DISCHARGE CONDITION:  Stable  ACTIVITY:  Activity as tolerated  OXYGEN:  Home Oxygen: No.   Oxygen Delivery: room air  DISCHARGE LOCATION:  home   If you experience worsening of your admission symptoms, develop shortness of breath, life threatening emergency, suicidal or homicidal thoughts you must seek  medical attention immediately by calling 911 or calling your MD immediately  if symptoms less severe.  You Must read complete instructions/literature along with all the possible adverse reactions/side effects for all the Medicines you take and that have been prescribed to you. Take any new Medicines after you have completely understood and accpet all the possible adverse reactions/side effects.   Please note  You were cared for by a hospitalist during your hospital stay. If you have any questions about your discharge medications or the care you received while you were in the hospital after you are discharged, you can call the unit and asked to speak with the hospitalist on  call if the hospitalist that took care of you is not available. Once you are discharged, your primary care physician will handle any further medical issues. Please note that NO REFILLS for any discharge medications will be authorized once you are discharged, as it is imperative that you return to your primary care physician (or establish a relationship with a primary care physician if you do not have one) for your aftercare needs so that they can reassess your need for medications and monitor your lab values.    On the day of Discharge:   VITAL SIGNS:  Blood pressure 140/70, pulse 58, temperature 97.5 F (36.4 C), temperature source Oral, resp. rate 16, height 5\' 2"  (1.575 m), weight 111 lb 5.3 oz (50.5 kg), SpO2 90 %.  I/O:   Intake/Output Summary (Last 24 hours) at 12/15/15 1020 Last data filed at 12/15/15 0830  Gross per 24 hour  Intake    120 ml  Output    500 ml  Net   -380 ml    PHYSICAL EXAMINATION:  GENERAL:  82 y.o.-year-old patient lying in the bed with no acute distress.  EYES: Pupils equal, round, reactive to light and accommodation. No scleral icterus. Extraocular muscles intact.  HEENT: Head atraumatic, normocephalic. Oropharynx and nasopharynx clear.  NECK:  Supple, no jugular venous distention. No  thyroid enlargement, no tenderness.  LUNGS: Normal breath sounds bilaterally, no wheezing, rales,rhonchi or crepitation. No use of accessory muscles of respiration.  CARDIOVASCULAR: S1, S2 normal. No murmurs, rubs, or gallops.  ABDOMEN: Soft, non-tender, non-distended. Bowel sounds present. No organomegaly or mass.  EXTREMITIES: No pedal edema, cyanosis, or clubbing.  NEUROLOGIC: Cranial nerves II through XII are intact. Muscle strength 5/5 in all extremities. Sensation intact. Gait not checked.  PSYCHIATRIC: The patient is alert and oriented x 3.  SKIN: No obvious rash, lesion, or ulcer.   DATA REVIEW:   CBC  Recent Labs Lab 12/12/15 0331  WBC 8.8  HGB 10.7*  HCT 29.8*  PLT 333    Chemistries   Recent Labs Lab 12/11/15 1439  12/15/15 0419  NA 118*  < > 123*  K 4.2  < > 4.7  CL 81*  < > 84*  CO2 27  < > 31  GLUCOSE 135*  < > 95  BUN 11  < > 22*  CREATININE 0.42*  < > 0.45  CALCIUM 8.8*  < > 8.7*  AST 29  --   --   ALT 25  --   --   ALKPHOS 121  --   --   BILITOT 0.9  --   --   < > = values in this interval not displayed.  Cardiac Enzymes  Recent Labs Lab 12/11/15 1439  TROPONINI <0.03    Microbiology Results  Results for orders placed or performed during the hospital encounter of 12/11/15  Blood culture (routine x 2)     Status: None (Preliminary result)   Collection Time: 12/11/15  6:10 PM  Result Value Ref Range Status   Specimen Description BLOOD LEFT ASSIST CONTROL  Final   Special Requests BOTTLES DRAWN AEROBIC AND ANAEROBIC  10CC  Final   Culture NO GROWTH 3 DAYS  Final   Report Status PENDING  Incomplete  Blood culture (routine x 2)     Status: None (Preliminary result)   Collection Time: 12/11/15  6:15 PM  Result Value Ref Range Status   Specimen Description BLOOD RIGHT ASSIST CONTROL  Final   Special Requests BOTTLES DRAWN  AEROBIC AND ANAEROBIC  15CC  Final   Culture NO GROWTH 3 DAYS  Final   Report Status PENDING  Incomplete  MRSA PCR  Screening     Status: None   Collection Time: 12/11/15  9:17 PM  Result Value Ref Range Status   MRSA by PCR NEGATIVE NEGATIVE Final    Comment:        The GeneXpert MRSA Assay (FDA approved for NASAL specimens only), is one component of a comprehensive MRSA colonization surveillance program. It is not intended to diagnose MRSA infection nor to guide or monitor treatment for MRSA infections.     RADIOLOGY:  No results found.   Management plans discussed with the patient, family and they are in agreement.  CODE STATUS:     Code Status Orders        Start     Ordered   12/11/15 2130  Do not attempt resuscitation (DNR)   Continuous    Question Answer Comment  In the event of cardiac or respiratory ARREST Do not call a "code blue"   In the event of cardiac or respiratory ARREST Do not perform Intubation, CPR, defibrillation or ACLS   In the event of cardiac or respiratory ARREST Use medication by any route, position, wound care, and other measures to relive pain and suffering. May use oxygen, suction and manual treatment of airway obstruction as needed for comfort.   Comments rn may pronounce      12/11/15 2129    Code Status History    Date Active Date Inactive Code Status Order ID Comments User Context   09/25/2015  4:17 PM 09/26/2015 10:07 PM DNR [id number]  Altamese Dilling, MD ED    Advance Directive Documentation        Most Recent Value   Type of Advance Directive  Out of facility DNR (pink MOST or yellow form), Healthcare Power of Attorney   Pre-existing out of facility DNR order (yellow form or pink MOST form)  Physician notified to receive inpatient order, Yellow form placed in chart (order not valid for inpatient use)   "MOST" Form in Place?        TOTAL TIME TAKING CARE OF THIS PATIENT: 28 minutes.    Alyssa Landry,  Mardi Mainland.D on 12/15/2015 at 10:20 AM  Between 7am to 6pm - Pager - [phone]  After 6pm go to [email or web address] - Social research officer, government  [facility] [place] [facility]  Office  [phone]  CC: Primary care physician; Rafael Bihari, MD

## 2015-12-15 NOTE — Progress Notes (Signed)
Patient discharged home at this time escorted by staff member and daughter via wheelchair.Discharge instructions explained and well understood,priscriptions given.Vital signs within normal limits upon discharge.

## 2015-12-16 LAB — CULTURE, BLOOD (ROUTINE X 2)
CULTURE: NO GROWTH
CULTURE: NO GROWTH

## 2016-01-26 ENCOUNTER — Encounter: Payer: Self-pay | Admitting: Emergency Medicine

## 2016-01-26 ENCOUNTER — Emergency Department: Payer: Medicare Other

## 2016-01-26 ENCOUNTER — Inpatient Hospital Stay
Admission: EM | Admit: 2016-01-26 | Discharge: 2016-01-30 | DRG: 190 | Disposition: A | Discharge status: Home / Self Care | Payer: Medicare Other | Attending: Specialist | Admitting: Specialist

## 2016-01-26 DIAGNOSIS — R05 Cough: Secondary | ICD-10-CM | POA: Diagnosis present

## 2016-01-26 DIAGNOSIS — Z8673 Personal history of transient ischemic attack (TIA), and cerebral infarction without residual deficits: Secondary | ICD-10-CM

## 2016-01-26 DIAGNOSIS — E222 Syndrome of inappropriate secretion of antidiuretic hormone: Secondary | ICD-10-CM | POA: Diagnosis present

## 2016-01-26 DIAGNOSIS — Z79899 Other long term (current) drug therapy: Secondary | ICD-10-CM

## 2016-01-26 DIAGNOSIS — Z888 Allergy status to other drugs, medicaments and biological substances status: Secondary | ICD-10-CM | POA: Diagnosis not present

## 2016-01-26 DIAGNOSIS — I5033 Acute on chronic diastolic (congestive) heart failure: Secondary | ICD-10-CM | POA: Diagnosis present

## 2016-01-26 DIAGNOSIS — E039 Hypothyroidism, unspecified: Secondary | ICD-10-CM | POA: Diagnosis present

## 2016-01-26 DIAGNOSIS — R059 Cough, unspecified: Secondary | ICD-10-CM

## 2016-01-26 DIAGNOSIS — M1990 Unspecified osteoarthritis, unspecified site: Secondary | ICD-10-CM | POA: Diagnosis present

## 2016-01-26 DIAGNOSIS — J9621 Acute and chronic respiratory failure with hypoxia: Secondary | ICD-10-CM | POA: Diagnosis present

## 2016-01-26 DIAGNOSIS — I110 Hypertensive heart disease with heart failure: Secondary | ICD-10-CM | POA: Diagnosis present

## 2016-01-26 DIAGNOSIS — R079 Chest pain, unspecified: Secondary | ICD-10-CM | POA: Diagnosis not present

## 2016-01-26 DIAGNOSIS — T380X5A Adverse effect of glucocorticoids and synthetic analogues, initial encounter: Secondary | ICD-10-CM | POA: Diagnosis present

## 2016-01-26 DIAGNOSIS — J189 Pneumonia, unspecified organism: Secondary | ICD-10-CM | POA: Diagnosis present

## 2016-01-26 DIAGNOSIS — Z90710 Acquired absence of both cervix and uterus: Secondary | ICD-10-CM | POA: Diagnosis not present

## 2016-01-26 DIAGNOSIS — Z806 Family history of leukemia: Secondary | ICD-10-CM

## 2016-01-26 DIAGNOSIS — Z7951 Long term (current) use of inhaled steroids: Secondary | ICD-10-CM

## 2016-01-26 DIAGNOSIS — J441 Chronic obstructive pulmonary disease with (acute) exacerbation: Secondary | ICD-10-CM | POA: Diagnosis present

## 2016-01-26 DIAGNOSIS — K219 Gastro-esophageal reflux disease without esophagitis: Secondary | ICD-10-CM | POA: Diagnosis present

## 2016-01-26 DIAGNOSIS — R0602 Shortness of breath: Secondary | ICD-10-CM

## 2016-01-26 DIAGNOSIS — Z8249 Family history of ischemic heart disease and other diseases of the circulatory system: Secondary | ICD-10-CM | POA: Diagnosis not present

## 2016-01-26 DIAGNOSIS — J440 Chronic obstructive pulmonary disease with acute lower respiratory infection: Secondary | ICD-10-CM | POA: Diagnosis present

## 2016-01-26 DIAGNOSIS — Z885 Allergy status to narcotic agent status: Secondary | ICD-10-CM | POA: Diagnosis not present

## 2016-01-26 DIAGNOSIS — I482 Chronic atrial fibrillation: Secondary | ICD-10-CM | POA: Diagnosis present

## 2016-01-26 DIAGNOSIS — M810 Age-related osteoporosis without current pathological fracture: Secondary | ICD-10-CM | POA: Diagnosis present

## 2016-01-26 DIAGNOSIS — Z7982 Long term (current) use of aspirin: Secondary | ICD-10-CM | POA: Diagnosis not present

## 2016-01-26 DIAGNOSIS — Z66 Do not resuscitate: Secondary | ICD-10-CM | POA: Diagnosis present

## 2016-01-26 DIAGNOSIS — R0603 Acute respiratory distress: Secondary | ICD-10-CM

## 2016-01-26 LAB — BASIC METABOLIC PANEL
ANION GAP: 10 (ref 5–15)
BUN: 17 mg/dL (ref 6–20)
CALCIUM: 8.7 mg/dL — AB (ref 8.9–10.3)
CO2: 27 mmol/L (ref 22–32)
CREATININE: 0.45 mg/dL (ref 0.44–1.00)
Chloride: 84 mmol/L — ABNORMAL LOW (ref 101–111)
Glucose, Bld: 132 mg/dL — ABNORMAL HIGH (ref 65–99)
Potassium: 4.3 mmol/L (ref 3.5–5.1)
SODIUM: 121 mmol/L — AB (ref 135–145)

## 2016-01-26 LAB — CBC WITH DIFFERENTIAL/PLATELET
BASOS ABS: 0 10*3/uL (ref 0–0.1)
Basophils Relative: 0 %
EOS ABS: 0 10*3/uL (ref 0–0.7)
EOS PCT: 0 %
HCT: 31.5 % — ABNORMAL LOW (ref 35.0–47.0)
HEMOGLOBIN: 10.9 g/dL — AB (ref 12.0–16.0)
LYMPHS PCT: 10 %
Lymphs Abs: 1.4 10*3/uL (ref 1.0–3.6)
MCH: 29.9 pg (ref 26.0–34.0)
MCHC: 34.5 g/dL (ref 32.0–36.0)
MCV: 86.6 fL (ref 80.0–100.0)
Monocytes Absolute: 1.8 10*3/uL — ABNORMAL HIGH (ref 0.2–0.9)
Monocytes Relative: 12 %
NEUTROS PCT: 78 %
Neutro Abs: 11.7 10*3/uL — ABNORMAL HIGH (ref 1.4–6.5)
PLATELETS: 331 10*3/uL (ref 150–440)
RBC: 3.64 MIL/uL — AB (ref 3.80–5.20)
RDW: 14.2 % (ref 11.5–14.5)
WBC: 15 10*3/uL — AB (ref 3.6–11.0)

## 2016-01-26 LAB — LACTIC ACID, PLASMA
LACTIC ACID, VENOUS: 1.2 mmol/L (ref 0.5–1.9)
Lactic Acid, Venous: 2 mmol/L (ref 0.5–1.9)

## 2016-01-26 LAB — TROPONIN I

## 2016-01-26 LAB — BRAIN NATRIURETIC PEPTIDE: B NATRIURETIC PEPTIDE 5: 279 pg/mL — AB (ref 0.0–100.0)

## 2016-01-26 MED ORDER — CLONIDINE HCL 0.1 MG PO TABS
0.1000 mg | ORAL_TABLET | Freq: Three times a day (TID) | ORAL | Status: DC
  Administered 2016-01-26 – 2016-01-30 (×10): 0.1 mg via ORAL
  Filled 2016-01-26 (×10): qty 1

## 2016-01-26 MED ORDER — METHYLPREDNISOLONE SODIUM SUCC 40 MG IJ SOLR
40.0000 mg | Freq: Three times a day (TID) | INTRAMUSCULAR | Status: DC
  Administered 2016-01-26 – 2016-01-28 (×5): 40 mg via INTRAVENOUS
  Filled 2016-01-26 (×5): qty 1

## 2016-01-26 MED ORDER — DEXTROSE 5 % IV SOLN
1.0000 g | Freq: Once | INTRAVENOUS | Status: AC
  Administered 2016-01-26: 1 g via INTRAVENOUS
  Filled 2016-01-26: qty 10

## 2016-01-26 MED ORDER — LORAZEPAM 0.5 MG PO TABS
0.5000 mg | ORAL_TABLET | Freq: Three times a day (TID) | ORAL | Status: DC
  Administered 2016-01-26 – 2016-01-30 (×11): 0.5 mg via ORAL
  Filled 2016-01-26 (×11): qty 1

## 2016-01-26 MED ORDER — BUDESONIDE 0.25 MG/2ML IN SUSP
0.2500 mg | Freq: Two times a day (BID) | RESPIRATORY_TRACT | Status: DC
  Administered 2016-01-27 – 2016-01-30 (×7): 0.25 mg via RESPIRATORY_TRACT
  Filled 2016-01-26 (×7): qty 2

## 2016-01-26 MED ORDER — ENOXAPARIN SODIUM 30 MG/0.3ML ~~LOC~~ SOLN: 30.0000 mg | SUBCUTANEOUS | Status: DC

## 2016-01-26 MED ORDER — DEXTROSE 5 % IV SOLN
500.0000 mg | Freq: Once | INTRAVENOUS | Status: AC
  Administered 2016-01-26: 500 mg via INTRAVENOUS
  Filled 2016-01-26: qty 500

## 2016-01-26 MED ORDER — LOSARTAN POTASSIUM 50 MG PO TABS
50.0000 mg | ORAL_TABLET | Freq: Every day | ORAL | Status: DC
  Administered 2016-01-27 – 2016-01-30 (×4): 50 mg via ORAL
  Filled 2016-01-26 (×4): qty 1

## 2016-01-26 MED ORDER — LEVOTHYROXINE SODIUM 100 MCG PO TABS
100.0000 ug | ORAL_TABLET | Freq: Every day | ORAL | Status: DC
  Administered 2016-01-27 – 2016-01-30 (×4): 100 ug via ORAL
  Filled 2016-01-26 (×4): qty 1

## 2016-01-26 MED ORDER — VITAMIN B-12 1000 MCG PO TABS
1000.0000 ug | ORAL_TABLET | Freq: Every day | ORAL | Status: DC
  Administered 2016-01-27 – 2016-01-30 (×4): 1000 ug via ORAL
  Filled 2016-01-26 (×5): qty 1

## 2016-01-26 MED ORDER — VITAMIN D 1000 UNITS PO TABS
1000.0000 [IU] | ORAL_TABLET | Freq: Every day | ORAL | Status: DC
  Administered 2016-01-27 – 2016-01-30 (×4): 1000 [IU] via ORAL
  Filled 2016-01-26 (×4): qty 1

## 2016-01-26 MED ORDER — FAMOTIDINE 20 MG PO TABS
20.0000 mg | ORAL_TABLET | Freq: Every day | ORAL | Status: DC
  Administered 2016-01-26 – 2016-01-30 (×5): 20 mg via ORAL
  Filled 2016-01-26 (×5): qty 1

## 2016-01-26 MED ORDER — ENOXAPARIN SODIUM 40 MG/0.4ML ~~LOC~~ SOLN: 40.0000 mg | SUBCUTANEOUS | Status: DC

## 2016-01-26 MED ORDER — CEFTRIAXONE SODIUM 1 G IJ SOLR
1.0000 g | INTRAMUSCULAR | Status: DC
  Administered 2016-01-27 – 2016-01-29 (×3): 1 g via INTRAVENOUS
  Filled 2016-01-26 (×4): qty 10

## 2016-01-26 MED ORDER — ACETAMINOPHEN 650 MG RE SUPP: 650.0000 mg | Freq: Four times a day (QID) | RECTAL | Status: DC | PRN

## 2016-01-26 MED ORDER — SODIUM BICARBONATE 650 MG PO TABS
325.0000 mg | ORAL_TABLET | Freq: Four times a day (QID) | ORAL | Status: DC
  Administered 2016-01-26 – 2016-01-30 (×15): 325 mg via ORAL
  Filled 2016-01-26 (×6): qty 1
  Filled 2016-01-26: qty 2
  Filled 2016-01-26 (×7): qty 1
  Filled 2016-01-26: qty 2

## 2016-01-26 MED ORDER — FLUTICASONE PROPIONATE 50 MCG/ACT NA SUSP
2.0000 | NASAL | Status: DC
  Administered 2016-01-27 – 2016-01-30 (×4): 2 via NASAL
  Filled 2016-01-26: qty 16

## 2016-01-26 MED ORDER — DILTIAZEM HCL ER COATED BEADS 120 MG PO CP24
120.0000 mg | ORAL_CAPSULE | Freq: Every day | ORAL | Status: DC
  Administered 2016-01-27 – 2016-01-30 (×4): 120 mg via ORAL
  Filled 2016-01-26 (×4): qty 1

## 2016-01-26 MED ORDER — BUDESONIDE 0.25 MG/2ML IN SUSP: 0.2500 mg | Freq: Two times a day (BID) | RESPIRATORY_TRACT | Status: DC

## 2016-01-26 MED ORDER — POLYETHYLENE GLYCOL 3350 17 G PO PACK
17.0000 g | PACK | Freq: Every day | ORAL | Status: DC
  Administered 2016-01-27 – 2016-01-30 (×2): 17 g via ORAL
  Filled 2016-01-26 (×3): qty 1

## 2016-01-26 MED ORDER — CLOPIDOGREL BISULFATE 75 MG PO TABS
75.0000 mg | ORAL_TABLET | Freq: Every day | ORAL | Status: DC
  Administered 2016-01-27 – 2016-01-30 (×4): 75 mg via ORAL
  Filled 2016-01-26 (×5): qty 1

## 2016-01-26 MED ORDER — ASPIRIN EC 81 MG PO TBEC
81.0000 mg | DELAYED_RELEASE_TABLET | Freq: Every evening | ORAL | Status: DC
  Administered 2016-01-26 – 2016-01-29 (×4): 81 mg via ORAL
  Filled 2016-01-26 (×4): qty 1

## 2016-01-26 MED ORDER — OXYBUTYNIN CHLORIDE 10 % TD GEL: 1.0000 "application " | Freq: Every day | TRANSDERMAL | Status: DC

## 2016-01-26 MED ORDER — ONDANSETRON HCL 4 MG PO TABS
4.0000 mg | ORAL_TABLET | Freq: Four times a day (QID) | ORAL | Status: DC | PRN
  Administered 2016-01-29: 4 mg via ORAL

## 2016-01-26 MED ORDER — ADULT MULTIVITAMIN W/MINERALS CH
1.0000 | ORAL_TABLET | Freq: Every day | ORAL | Status: DC
  Administered 2016-01-27 – 2016-01-30 (×4): 1 via ORAL
  Filled 2016-01-26 (×5): qty 1

## 2016-01-26 MED ORDER — ENSURE ENLIVE PO LIQD
237.0000 mL | Freq: Every day | ORAL | Status: DC
  Administered 2016-01-27 – 2016-01-30 (×3): 237 mL via ORAL

## 2016-01-26 MED ORDER — DOCUSATE SODIUM 100 MG PO CAPS
100.0000 mg | ORAL_CAPSULE | Freq: Two times a day (BID) | ORAL | Status: DC
  Administered 2016-01-26 – 2016-01-30 (×8): 100 mg via ORAL
  Filled 2016-01-26 (×8): qty 1

## 2016-01-26 MED ORDER — FERROUS SULFATE 325 (65 FE) MG PO TABS
325.0000 mg | ORAL_TABLET | Freq: Two times a day (BID) | ORAL | Status: DC
  Administered 2016-01-27 – 2016-01-30 (×7): 325 mg via ORAL
  Filled 2016-01-26 (×7): qty 1

## 2016-01-26 MED ORDER — AMLODIPINE BESYLATE 10 MG PO TABS
10.0000 mg | ORAL_TABLET | Freq: Every day | ORAL | Status: DC
  Administered 2016-01-27 – 2016-01-30 (×4): 10 mg via ORAL
  Filled 2016-01-26 (×3): qty 1

## 2016-01-26 MED ORDER — IPRATROPIUM-ALBUTEROL 0.5-2.5 (3) MG/3ML IN SOLN
3.0000 mL | Freq: Four times a day (QID) | RESPIRATORY_TRACT | Status: DC
  Administered 2016-01-27 – 2016-01-30 (×13): 3 mL via RESPIRATORY_TRACT
  Filled 2016-01-26 (×14): qty 3

## 2016-01-26 MED ORDER — AZITHROMYCIN 250 MG PO TABS
250.0000 mg | ORAL_TABLET | Freq: Every day | ORAL | Status: AC
  Administered 2016-01-27 – 2016-01-30 (×4): 250 mg via ORAL
  Filled 2016-01-26 (×4): qty 1

## 2016-01-26 MED ORDER — FLUTICASONE PROPIONATE HFA 110 MCG/ACT IN AERO
2.0000 | INHALATION_SPRAY | Freq: Two times a day (BID) | RESPIRATORY_TRACT | Status: DC
Start: 2016-01-26 — End: 2016-01-26

## 2016-01-26 MED ORDER — ONDANSETRON HCL 4 MG/2ML IJ SOLN
4.0000 mg | Freq: Four times a day (QID) | INTRAMUSCULAR | Status: DC | PRN
  Administered 2016-01-27: 4 mg via INTRAVENOUS
  Filled 2016-01-26: qty 2

## 2016-01-26 MED ORDER — ACETAMINOPHEN 325 MG PO TABS: 650.0000 mg | ORAL_TABLET | Freq: Four times a day (QID) | ORAL | Status: DC | PRN

## 2016-01-26 MED ORDER — ALBUTEROL SULFATE (2.5 MG/3ML) 0.083% IN NEBU
5.0000 mg | INHALATION_SOLUTION | Freq: Once | RESPIRATORY_TRACT | Status: AC
  Administered 2016-01-26: 5 mg via RESPIRATORY_TRACT
  Filled 2016-01-26: qty 6

## 2016-01-26 NOTE — ED Notes (Signed)
Pt presents to ED from home via EMS  c/o SOB that started today . EMS found pt with 88% O2 on RA. Pt received 1 albuterol tx at home; 1 neb tx with EMS and placed on nonrebreather. Alert and oriented on arrival.

## 2016-01-26 NOTE — H&P (Signed)
[employer] [facility] [place] [facility]   PATIENT NAME: Alyssa Landry    MR#:  [id number]  DATE OF BIRTH:  1919/05/28  DATE OF ADMISSION:  01/26/2016  PRIMARY CARE PHYSICIAN: Rafael Bihari, MD   REQUESTING/REFERRING PHYSICIAN: Dr. Ileana Roup  CHIEF COMPLAINT:   Chief Complaint  Patient presents with  . Shortness of Breath    HISTORY OF PRESENT ILLNESS:  Alyssa Landry  is a 82 y.o. female with a known history of COPD, chronic respiratory failure, hypertension, GERD, hyperlipidemia, anxiety, hypothyroidism, osteoporosis, chronic hyponatremia, SIADH, atrial fibrillation who presents to the hospital complaining of shortness of breath. Patient was recently discharged from the hospital about a month or so ago treated for COPD exacerbation. She went to see her primary care physician's Friday as she was complaining of some cough which was nonproductive and also some chest tightness. She was empirically started on some oral Levaquin for treatment for possible pneumonia. This morning she was having significant shortness of breath and therefore she was given a rescue inhaler which did not alleviate her symptoms. Daughter didn't brought her to the ER for further evaluation. In the emergency room patient was noted to be acutely hypoxic with O2 sats in the 80s and noted to be in acute on chronic respiratory failure with hypoxia secondary to COPD exacerbation and hospitalist services were contacted further treatment and evaluation. Patient denies any chest pain, abdominal pain, nausea, vomiting, fever, chills or any other associated symptoms presently.  PAST MEDICAL HISTORY:   Past Medical History  Diagnosis Date  . Hypertension   . COPD (chronic obstructive pulmonary disease) (HCC)   . TIA (transient ischemic attack)   . Acid reflux   . Arthritis   . Hyperlipidemia   . Anxiety   . Hypothyroidism   . Hiatal hernia   . Asthma   . Incontinence   . Osteoporosis,  post-menopausal   . Chronic hyponatremia   . SIADH (syndrome of inappropriate ADH production) (HCC)   . GERD (gastroesophageal reflux disease)   . Hiatal hernia   . Migraine   . Anemia   . Pulmonary fibrosis (HCC)   . Persistent cough   . A-fib [facility])     PAST SURGICAL HISTORY:   Past Surgical History  Procedure Laterality Date  . Abdominal hysterectomy  1966    SOCIAL HISTORY:   Social History  Substance Use Topics  . Smoking status: Never Smoker   . Smokeless tobacco: Not on file  . Alcohol Use: No    FAMILY HISTORY:   Family History  Problem Relation Age of Onset  . Leukemia Mother   . Heart disease Father   . Hypertension      DRUG ALLERGIES:   Allergies  Allergen Reactions  . Codeine Other (See Comments)    Reaction:  Unknown   . Ditropan [Oxybutynin] Other (See Comments)    Reaction:  Unknown   . Hctz [Hydrochlorothiazide] Other (See Comments)    Reaction:  Causes pts sodium/potassium levels to drop significantly   . Metronidazole Other (See Comments)    Reaction:  Unknown   . Propranolol Other (See Comments)    Reaction:  Unknown   . Tavist [Clemastine] Other (See Comments)    Reaction:  Unknown   . Tramadol Other (See Comments)    Reaction:  Unknown   . Vicodin [Hydrocodone-Acetaminophen] Other (See Comments)    Reaction:  Unknown     REVIEW OF SYSTEMS:   Review of Systems  Constitutional: Negative for fever and weight loss.  HENT: Negative for congestion, nosebleeds and tinnitus.   Eyes: Negative for blurred vision, double vision and redness.  Respiratory: Positive for cough, shortness of breath and wheezing. Negative for hemoptysis.   Cardiovascular: Negative for chest pain, orthopnea, leg swelling and PND.  Gastrointestinal: Negative for nausea, vomiting, abdominal pain, diarrhea and melena.  Genitourinary: Negative for dysuria, urgency and hematuria.  Musculoskeletal: Negative for joint pain and falls.  Neurological: Negative for  dizziness, tingling, sensory change, focal weakness, seizures, weakness and headaches.  Endo/Heme/Allergies: Negative for polydipsia. Does not bruise/bleed easily.  Psychiatric/Behavioral: Negative for depression and memory loss. The patient is not nervous/anxious.     MEDICATIONS AT HOME:   Prior to Admission medications   Medication Sig Start Date End Date Taking? Authorizing Provider  albuterol (PROVENTIL HFA;VENTOLIN HFA) 108 (90 BASE) MCG/ACT inhaler Inhale 1-2 puffs into the lungs every 4 (four) hours as needed for wheezing or shortness of breath.   Yes Historical Provider, MD  amLODipine (NORVASC) 10 MG tablet Take 10 mg by mouth daily.   Yes Historical Provider, MD  aspirin EC 81 MG tablet Take 81 mg by mouth every evening.    Yes Historical Provider, MD  cholecalciferol (VITAMIN D) 1000 UNITS tablet Take 1,000 Units by mouth daily.   Yes Historical Provider, MD  cloNIDine (CATAPRES) 0.1 MG tablet Take 0.1 mg by mouth 3 (three) times daily.   Yes Historical Provider, MD  clopidogrel (PLAVIX) 75 MG tablet Take 75 mg by mouth daily.   Yes Historical Provider, MD  diltiazem (CARDIZEM CD) 120 MG 24 hr capsule Take 1 capsule (120 mg total) by mouth daily. 12/15/15  Yes Wyatt Haste, MD  docusate sodium (COLACE) 100 MG capsule Take 100 mg by mouth 2 (two) times daily.    Yes Historical Provider, MD  feeding supplement, ENSURE ENLIVE, (ENSURE ENLIVE) LIQD Take 237 mLs by mouth daily.    Yes Historical Provider, MD  ferrous sulfate 325 (65 FE) MG tablet Take 325 mg by mouth 2 (two) times daily with a meal.    Yes Historical Provider, MD  fluticasone (FLONASE) 50 MCG/ACT nasal spray Place 2 sprays into both nostrils every morning.    Yes Historical Provider, MD  fluticasone (FLOVENT HFA) 110 MCG/ACT inhaler Inhale 2 puffs into the lungs 2 (two) times daily.   Yes Historical Provider, MD  levofloxacin (LEVAQUIN) 250 MG tablet Take 250 mg by mouth daily. X 10 days. 01/24/16 02/03/16 Yes Historical  Provider, MD  levothyroxine (SYNTHROID, LEVOTHROID) 100 MCG tablet Take 100 mcg by mouth daily before breakfast.   Yes Historical Provider, MD  LORazepam (ATIVAN) 0.5 MG tablet Take 0.5 mg by mouth 3 (three) times daily.    Yes Historical Provider, MD  losartan (COZAAR) 50 MG tablet Take 50 mg by mouth daily.   Yes Historical Provider, MD  Multiple Vitamin (MULTIVITAMIN WITH MINERALS) TABS tablet Take 1 tablet by mouth daily.   Yes Historical Provider, MD  Oxybutynin Chloride 10 % GEL Place 1 application onto the skin at bedtime.   Yes Historical Provider, MD  polyethylene glycol (MIRALAX / GLYCOLAX) packet Take 17 g by mouth daily. *Mix in 4-8 ounces of fluid prior to taking*   Yes Historical Provider, MD  ranitidine (ZANTAC) 150 MG tablet Take 150 mg by mouth 2 (two) times daily before a meal.    Yes Historical Provider, MD  sodium bicarbonate 325 MG tablet Take 325 mg by mouth 4 (four) times  daily.   Yes Historical Provider, MD  tiotropium (SPIRIVA) 18 MCG inhalation capsule Place 18 mcg into inhaler and inhale daily.   Yes Historical Provider, MD  vitamin B-12 (CYANOCOBALAMIN) 1000 MCG tablet Take 1,000 mcg by mouth daily.   Yes Historical Provider, MD  azithromycin (ZITHROMAX) 250 MG tablet Take 1 tablet (250 mg total) by mouth daily. 12/15/15   Wyatt Hasteavid K Hower, MD  benzonatate (TESSALON) 100 MG capsule Take 100 mg by mouth 3 (three) times daily as needed for cough.    Historical Provider, MD  predniSONE (STERAPRED UNI-PAK 21 TAB) 10 MG (21) TBPK tablet Take 1 tablet (10 mg total) by mouth daily. 40mg  oral 1 day, then 20mg  oral for 2 days, then 10mg  oral 2 days, then stop 12/15/15   Wyatt Hasteavid K Hower, MD      VITAL SIGNS:  Blood pressure 151/65, pulse 57, temperature 98 F (36.7 C), temperature source Oral, resp. rate 20, height 5\' 3"  (1.6 m), weight 54.432 kg (120 lb), SpO2 87 %.  PHYSICAL EXAMINATION:  Physical Exam  GENERAL:  82 y.o.-year-old patient lying in the bed in mild resp. Distress.    EYES: Pupils equal, round, reactive to light and accommodation. No scleral icterus. Extraocular muscles intact.  HEENT: Head atraumatic, normocephalic. Oropharynx and nasopharynx clear. No oropharyngeal erythema, moist oral mucosa  NECK:  Supple, no jugular venous distention. No thyroid enlargement, no tenderness.  LUNGS: Good A/e B/l, minimal wheezing, rhonchi b/l. No use of accessory muscles of respiration.  CARDIOVASCULAR: S1, S2 RRR. No murmurs, rubs, gallops, clicks.  ABDOMEN: Soft, nontender, nondistended. Bowel sounds present. No organomegaly or mass.  EXTREMITIES: No pedal edema, cyanosis, or clubbing. + 2 pedal & radial pulses b/l.   NEUROLOGIC: Cranial nerves II through XII are intact. No focal Motor or sensory deficits appreciated b/l.  Globally weak.  PSYCHIATRIC: The patient is alert and oriented x 3. Good affect.  SKIN: No obvious rash, lesion, or ulcer.   LABORATORY PANEL:   CBC  Recent Labs Lab 01/26/16 1820  WBC 15.0*  HGB 10.9*  HCT 31.5*  PLT 331   ------------------------------------------------------------------------------------------------------------------  Chemistries   Recent Labs Lab 01/26/16 1820  NA 121*  K 4.3  CL 84*  CO2 27  GLUCOSE 132*  BUN 17  CREATININE 0.45  CALCIUM 8.7*   ------------------------------------------------------------------------------------------------------------------  Cardiac Enzymes  Recent Labs Lab 01/26/16 1820  TROPONINI <0.03   ------------------------------------------------------------------------------------------------------------------  RADIOLOGY:  Dg Neck Soft Tissue  01/26/2016  CLINICAL DATA:  History of bronchitis. Worsening shortness of breath. EXAM: NECK SOFT TISSUES - 1+ VIEW COMPARISON:  None. FINDINGS: There is no evidence of retropharyngeal soft tissue swelling or epiglottic enlargement. The cervical airway is unremarkable and no radio-opaque foreign body identified. 4 mm of  anterolisthesis of C3 on C4 secondary to facet disease. Degenerative disc disease with disc height loss at C4-5, C5-6 and C6-7. Thoracic aortic atherosclerosis. IMPRESSION: 1. No radiopaque foreign body in the neck soft tissues. 2.  Aortic Atherosclerosis (ICD10-170.0) Electronically Signed   By: Elige KoHetal  Patel   On: 01/26/2016 19:18   Dg Chest Port 1 View  01/26/2016  CLINICAL DATA:  Worsening shortness of breath.  Bronchitis. EXAM: PORTABLE CHEST 1 VIEW COMPARISON:  12/11/2015 FINDINGS: Bilateral mild interstitial thickening. Small left pleural effusion. Trace right pleural effusion. No pneumothorax. Stable cardiomegaly. Thoracic aortic atherosclerosis. No acute osseous abnormality. IMPRESSION: Findings concerning for mild CHF. Aortic Atherosclerosis (ICD10-170.0) Electronically Signed   By: Elige KoHetal  Patel   On: 01/26/2016 19:14  IMPRESSION AND PLAN:   82 year old female with past medical history of COPD, anxiety, hypertension, osteoporosis, GERD, history of previous TIA, hypothyroidism, chronic hyponatremia, atrial fibrillation and presents to the hospital due to shortness of breath.  1. Acute on chronic respiratory failure with hypoxia-this is secondary to COPD exacerbation and suspected pneumonia. -I will treat her underlying COPD with IV steroids, DuoNeb's, and Antibiotics. -She will need to be assessed for home oxygen prior to discharge.  2. COPD exacerbation-suspected to be due to pneumonia. -I will place on IV steroids, scheduled DuoNeb's, Pulmicort nebs, empiric IV ceftriaxone, Zithromax. -Assess for home oxygen prior to discharge.  3. History of previous TIAs-continue Plavix.  4. History of chronic atrial fibrillation-rate controlled. Continue Cardizem.  5. Essential hypertension-continue Cardizem, amlodipine, losartan.  6. Hypothyroidism-continue Synthroid.  7. GERD-continue Pepcid.   All the records are reviewed and case discussed with ED provider. Management plans  discussed with the patient, family and they are in agreement.  CODE STATUS: DO NOT RESUSCITATE  TOTAL TIME TAKING CARE OF THIS PATIENT: 45 minutes.    Houston Siren M.D on 01/26/2016 at 8:50 PM  Between 7am to 6pm - Pager - [phone]  After 6pm go to [email or web address] - password [email or web address] [facility]  [facility]  Office  [phone]  CC: Primary care physician; Rafael Bihari, MD

## 2016-01-26 NOTE — ED Provider Notes (Addendum)
[facility] Emergency Department Provider Note  ____________________________________________   I have reviewed the triage vital signs and the nursing notes.   HISTORY  Chief Complaint Shortness of Breath    HPI Alyssa Landry is a 82 y.o. female with a history of CHF, COPD who is not on home oxygen, recently admitted for COPD flare, started having a cough last Friday. Her doctor put him on Levaquin that she has had continued cough and then today she became acutely short of breath. EMS found her working hard to breathe and sat in the mid 80s. Patient denies any chest pain. They gave her Solu-Medrol, as well as albuterol and she states her breathing is improved however she is still somewhat hypoxic. She denies any fever or chills. She denies throat swelling or discomfort.      Past Medical History  Diagnosis Date  . Hypertension   . COPD (chronic obstructive pulmonary disease) (HCC)   . TIA (transient ischemic attack)   . Acid reflux   . Arthritis   . Hyperlipidemia   . Anxiety   . Hypothyroidism   . Hiatal hernia   . Asthma   . Incontinence   . Osteoporosis, post-menopausal   . Chronic hyponatremia   . SIADH (syndrome of inappropriate ADH production) (HCC)   . GERD (gastroesophageal reflux disease)   . Hiatal hernia   . Migraine   . Anemia   . Pulmonary fibrosis (HCC)   . Persistent cough   . A-fib [facility](HCC)     Patient Active Problem List   Diagnosis Date Noted  . Atrial fibrillation with RVR (HCC) 12/11/2015  . Urge incontinence 09/26/2015  . Lichenified rash 09/26/2015  . Syncopal episodes 09/25/2015    Past Surgical History  Procedure Laterality Date  . Abdominal hysterectomy  1966    Current Outpatient Rx  Name  Route  Sig  Dispense  Refill  . albuterol (PROVENTIL HFA;VENTOLIN HFA) 108 (90 BASE) MCG/ACT inhaler   Inhalation   Inhale 1-2 puffs into the lungs every 4 (four) hours as needed for wheezing or shortness of breath.          Marland Kitchen. amLODipine (NORVASC) 10 MG tablet   Oral   Take 10 mg by mouth daily.         Marland Kitchen. aspirin EC 81 MG tablet   Oral   Take 81 mg by mouth every evening.          Marland Kitchen. azithromycin (ZITHROMAX) 250 MG tablet   Oral   Take 1 tablet (250 mg total) by mouth daily.   2 each   0   . benzonatate (TESSALON) 100 MG capsule   Oral   Take 100 mg by mouth 3 (three) times daily as needed for cough.         . cholecalciferol (VITAMIN D) 1000 UNITS tablet   Oral   Take 1,000 Units by mouth daily.         . cloNIDine (CATAPRES) 0.1 MG tablet   Oral   Take 0.1 mg by mouth 3 (three) times daily.         . clopidogrel (PLAVIX) 75 MG tablet   Oral   Take 75 mg by mouth daily.         Marland Kitchen. diltiazem (CARDIZEM CD) 120 MG 24 hr capsule   Oral   Take 1 capsule (120 mg total) by mouth daily.   30 capsule   0   . docusate sodium (COLACE) 100 MG  capsule   Oral   Take 100 mg by mouth 2 (two) times daily.          . feeding supplement, ENSURE ENLIVE, (ENSURE ENLIVE) LIQD   Oral   Take 237 mLs by mouth daily.          . ferrous sulfate 325 (65 FE) MG tablet   Oral   Take 325 mg by mouth 2 (two) times daily with a meal.          . fluticasone (FLONASE) 50 MCG/ACT nasal spray   Each Nare   Place 2 sprays into both nostrils daily.          . fluticasone (FLOVENT HFA) 110 MCG/ACT inhaler   Inhalation   Inhale 2 puffs into the lungs 2 (two) times daily.         Marland Kitchen levothyroxine (SYNTHROID, LEVOTHROID) 100 MCG tablet   Oral   Take 100 mcg by mouth daily before breakfast.         . LORazepam (ATIVAN) 0.5 MG tablet   Oral   Take 0.5 mg by mouth 3 (three) times daily.          Marland Kitchen losartan (COZAAR) 50 MG tablet   Oral   Take 50 mg by mouth daily.         . Multiple Vitamin (MULTIVITAMIN WITH MINERALS) TABS tablet   Oral   Take 1 tablet by mouth daily.         . Oxybutynin Chloride 10 % GEL   Transdermal   Place 1 application onto the skin at bedtime.          . polyethylene glycol (MIRALAX / GLYCOLAX) packet   Oral   Take 17 g by mouth daily.         . predniSONE (STERAPRED UNI-PAK 21 TAB) 10 MG (21) TBPK tablet   Oral   Take 1 tablet (10 mg total) by mouth daily.  oral 1 day, then  oral for 2 days, then  oral 2 days, then stop   10 tablet   0   . ranitidine (ZANTAC) 150 MG tablet   Oral   Take 150 mg by mouth 2 (two) times daily before a meal.          . sodium bicarbonate 325 MG tablet   Oral   Take 325 mg by mouth 4 (four) times daily.         Marland Kitchen tiotropium (SPIRIVA) 18 MCG inhalation capsule   Inhalation   Place 18 mcg into inhaler and inhale daily.         . vitamin B-12 (CYANOCOBALAMIN) 1000 MCG tablet   Oral   Take 1,000 mcg by mouth daily.           Allergies Codeine; Ditropan; Hctz; Metronidazole; Propranolol; Tavist; Tramadol; and Vicodin  Family History  Problem Relation Age of Onset  . Leukemia Mother   . Heart disease Father   . Hypertension      Social History Social History  Substance Use Topics  . Smoking status: Never Smoker   . Smokeless tobacco: None  . Alcohol Use: No    Review of Systems Constitutional: No fever/chills Eyes: No visual changes. ENT: No sore throat. No stiff neck no neck pain Cardiovascular: Denies chest pain. Respiratory: Positive shortness of breath. Gastrointestinal:   no vomiting.  No diarrhea.  No constipation. Genitourinary: Negative for dysuria. Musculoskeletal: Negative lower extremity swelling Skin: Negative for rash. Neurological: Negative for headaches, focal weakness  or numbness. 10-point ROS otherwise negative.  ____________________________________________   PHYSICAL EXAM:  VITAL SIGNS: ED Triage Vitals  Enc Vitals Group     BP 01/26/16 1813 151/65 mmHg     Pulse Rate 01/26/16 1813 57     Resp 01/26/16 1813 20     Temp 01/26/16 1813 98 F (36.7 C)     Temp Source 01/26/16 1813 Oral     SpO2 01/26/16 1813 87 %     Weight  01/26/16 1813 120 lb (54.432 kg)     Height 01/26/16 1813 5\' 3"  (1.6 m)     Head Cir --      Peak Flow --      Pain Score --      Pain Loc --      Pain Edu? --      Excl. in GC? --     Constitutional: Alert and oriented. Well appearing Patient with crease respiratory effort and speaking in 4 word dyspnea Eyes: Conjunctivae are normal. PERRL. EOMI. Head: Atraumatic. Nose: No congestion/rhinnorhea. Mouth/Throat: Mucous membranes are moist.  Oropharynx non-erythematous. Neck: No stridor.   Nontender with no meningismus Cardiovascular: Normal rate, regular rhythm. Grossly normal heart sounds.  Good peripheral circulation. Respiratory: With increased respiratory effort, occasional rhonchi noted no wheeze. No stridor. Abdominal: Soft and nontender. No distention. No guarding no rebound Back:  There is no focal tenderness or step off there is no midline tenderness there are no lesions noted. there is no CVA tenderness Musculoskeletal: No lower extremity tenderness. No joint effusions, no DVT signs strong distal pulses no edema Neurologic:  Normal speech and language. No gross focal neurologic deficits are appreciated.  Skin:  Skin is warm, dry and intact. No rash noted. Psychiatric: Mood and affect are normal. Speech and behavior are normal.  ____________________________________________   LABS (all labs ordered are listed, but only abnormal results are displayed)  Labs Reviewed  CBC WITH DIFFERENTIAL/PLATELET - Abnormal; Notable for the following:    WBC 15.0 (*)    RBC 3.64 (*)    Hemoglobin 10.9 (*)    HCT 31.5 (*)    Neutro Abs 11.7 (*)    Monocytes Absolute 1.8 (*)    All other components within normal limits  BASIC METABOLIC PANEL - Abnormal; Notable for the following:    Sodium 121 (*)    Chloride 84 (*)    Glucose, Bld 132 (*)    Calcium 8.7 (*)    All other components within normal limits  BRAIN NATRIURETIC PEPTIDE - Abnormal; Notable for the following:    B Natriuretic  Peptide 279.0 (*)    All other components within normal limits  CULTURE, BLOOD (ROUTINE X 2)  CULTURE, BLOOD (ROUTINE X 2)  LACTIC ACID, PLASMA  TROPONIN I  LACTIC ACID, PLASMA   ____________________________________________  EKG  I personally interpreted any EKGs ordered by me or triage Atrophic fibrillation rate 77 bpm no acute ST elevation or depressionof ST changes, ____________________________________________  RADIOLOGY  I reviewed any imaging ordered by me or triage that were performed during my shift and, if possible, patient and/or family made aware of any abnormal findings. ____________________________________________   PROCEDURES  Procedure(s) performed: None  Critical Care performed: CRITICAL CARE Performed by: Jeanmarie Plant   Total critical care time: 46 minutes  Critical care time was exclusive of separately billable procedures and treating other patients.  Critical care was necessary to treat or prevent imminent or life-threatening deterioration.  Critical care was time spent  personally by me on the following activities: development of treatment plan with patient and/or surrogate as well as nursing, discussions with consultants, evaluation of patient's response to treatment, examination of patient, obtaining history from patient or surrogate, ordering and performing treatments and interventions, ordering and review of laboratory studies, ordering and review of radiographic studies, pulse oximetry and re-evaluation of patient's condition.   ____________________________________________   INITIAL IMPRESSION / ASSESSMENT AND PLAN / ED COURSE  Pertinent labs & imaging results that were available during my care of the patient were reviewed by me and considered in my medical decision making (see chart for details).  Patient with acute shortness of breath, she received appropriate interventions from EMS, and now is doing much better. Initially I did order BiPAP  however she is much more comfortable at this time. Her oxygen saturations 100% on 2 L oxygen. I discussed with Dr. Kayleen Memos the hospitalist, who asked me to give the patient Rocephin and Zithromax. We will continue with fluid restriction given her recurrent hyponatremia and we will admit her to the hospital. ____________________________________________   FINAL CLINICAL IMPRESSION(S) / ED DIAGNOSES  Final diagnoses:  Cough      This chart was dictated using voice recognition software.  Despite best efforts to proofread,  errors can occur which can change meaning.     Jeanmarie Plant, MD 01/26/16 1925  Jeanmarie Plant, MD 01/26/16 [phone]

## 2016-01-27 ENCOUNTER — Inpatient Hospital Stay: Payer: Medicare Other

## 2016-01-27 ENCOUNTER — Inpatient Hospital Stay (HOSPITAL_COMMUNITY)
Admit: 2016-01-27 | Discharge: 2016-01-27 | Disposition: A | Discharge status: Home / Self Care | Payer: Medicare Other | Attending: Internal Medicine | Admitting: Internal Medicine

## 2016-01-27 DIAGNOSIS — R079 Chest pain, unspecified: Secondary | ICD-10-CM

## 2016-01-27 LAB — BASIC METABOLIC PANEL
Anion gap: 9 (ref 5–15)
BUN: 19 mg/dL (ref 6–20)
CHLORIDE: 82 mmol/L — AB (ref 101–111)
CO2: 27 mmol/L (ref 22–32)
Calcium: 8.6 mg/dL — ABNORMAL LOW (ref 8.9–10.3)
Creatinine, Ser: 0.58 mg/dL (ref 0.44–1.00)
GFR calc Af Amer: >60 mL/min (ref >60)
GFR calc non Af Amer: >60 mL/min (ref >60)
GLUCOSE: 211 mg/dL — AB (ref 65–99)
POTASSIUM: 4.9 mmol/L (ref 3.5–5.1)
SODIUM: 118 mmol/L — AB (ref 135–145)

## 2016-01-27 LAB — CBC
HEMATOCRIT: 29.2 % — AB (ref 35.0–47.0)
Hemoglobin: 10.3 g/dL — ABNORMAL LOW (ref 12.0–16.0)
MCH: 30.1 pg (ref 26.0–34.0)
MCHC: 35.3 g/dL (ref 32.0–36.0)
MCV: 85.3 fL (ref 80.0–100.0)
Platelets: 298 10*3/uL (ref 150–440)
RBC: 3.42 MIL/uL — ABNORMAL LOW (ref 3.80–5.20)
RDW: 13.6 % (ref 11.5–14.5)
WBC: 11 10*3/uL (ref 3.6–11.0)

## 2016-01-27 LAB — LACTIC ACID, PLASMA: Lactic Acid, Venous: 1.5 mmol/L (ref 0.5–1.9)

## 2016-01-27 MED ORDER — FUROSEMIDE 10 MG/ML IJ SOLN
20.0000 mg | Freq: Two times a day (BID) | INTRAMUSCULAR | Status: DC
  Administered 2016-01-27 – 2016-01-30 (×7): 20 mg via INTRAVENOUS
  Filled 2016-01-27 (×7): qty 2

## 2016-01-27 MED ORDER — IOPAMIDOL (ISOVUE-300) INJECTION 61%
75.0000 mL | Freq: Once | INTRAVENOUS | Status: AC | PRN
  Administered 2016-01-27: 12:00:00 75 mL via INTRAVENOUS

## 2016-01-27 MED ORDER — ENOXAPARIN SODIUM 30 MG/0.3ML ~~LOC~~ SOLN
30.0000 mg | SUBCUTANEOUS | Status: DC
  Administered 2016-01-27 – 2016-01-30 (×4): 30 mg via SUBCUTANEOUS
  Filled 2016-01-27 (×4): qty 0.3

## 2016-01-27 NOTE — Care Management Note (Signed)
Case Management Note  Patient Details  Name: [name] MRN: [id number] Date of Birth: [birth year]  Subjective/Objective:    82yo Mrs Alyssa Landry was admitted from home on 01/26/16 per COPD and Pneumonia. She resides with her daughter [name] who also provides transportation. PCP=John Walker III . Pharmacy[place] [place]. Home equipment includes a rolliator, a standard walker, and a cane. No home oxygen. No home health services. [facility] home health care provider if home health is ordered. Daughter Elease Hashimotoatricia will [facility] the [facility] Care Manager if she decides on another home health provider after looking through her records at home. Case management will follow for discharge planning.                  Action/Plan:   Expected Discharge Date:  01/28/16               Expected Discharge Plan:     In-House Referral:     Discharge planning Services     Post Acute Care Choice:    Choice offered to:     DME Arranged:    DME Agency:     HH Arranged:    HH Agency:     Status of Service:     If discussed at [employer] Meetings, dates discussed:    Additional Comments:  Emberlin Verner A, RN 01/27/2016, 3:52 PM

## 2016-01-27 NOTE — Progress Notes (Signed)
[facility] [place] at [facility]                                                                                                                                                                                            Patient Demographics   Alyssa Landry, is a 82 y.o. female, DOB - 1918-11-23, ZOX:[id number]  Admit date - 01/26/2016   Admitting Physician Houston Siren, MD  Outpatient Primary MD for the patient is Danne Harbor, Letta Pate, MD   LOS - 1  Subjective: Pt continues to c/o sob, Cough Daughter reports the patient has not urinated much no chest pains    Review of Systems:   CONSTITUTIONAL: No documented fever. No fatigue, weakness. No weight gain, no weight loss.  EYES: No blurry or double vision.  ENT: No tinnitus. No postnasal drip. No redness of the oropharynx.  RESPIRATORY: Positive cough, no wheeze, no hemoptysis.Positive dyspnea.  CARDIOVASCULAR: No chest pain. No orthopnea. No palpitations. No syncope.  GASTROINTESTINAL: No nausea, no vomiting or diarrhea. No abdominal pain. No melena or hematochezia.  GENITOURINARY: No dysuria or hematuria.  ENDOCRINE: No polyuria or nocturia. No heat or cold intolerance.  HEMATOLOGY: No anemia. No bruising. No bleeding.  INTEGUMENTARY: No rashes. No lesions.  MUSCULOSKELETAL: No arthritis. No swelling. No gout.  NEUROLOGIC: No numbness, tingling, or ataxia. No seizure-type activity.  PSYCHIATRIC: No anxiety. No insomnia. No ADD.    Vitals:   Filed Vitals:   01/27/16 0011 01/27/16 0247 01/27/16 0440 01/27/16 0826  BP: 150/52  146/63 126/43  Pulse: 72  68 68  Temp:   97.5 F (36.4 C) 98.4 F (36.9 C)  TempSrc:   Oral Oral  Resp:   16   Height:      Weight:      SpO2: 100% 95% 98% 96%    Wt Readings from Last 3 Encounters:  01/26/16 51.483 kg (113 lb 8 oz)  12/11/15 50.5 kg (111 lb 5.3 oz)  09/25/15 46.358 kg (102 lb 3.2 oz)     Intake/Output Summary (Last 24 hours) at 01/27/16  1314 Last data filed at 01/27/16 0900  Gross per 24 hour  Intake    240 ml  Output    350 ml  Net   -110 ml    Physical Exam:   GENERAL: Pleasant-appearing in no apparent distress.  HEAD, EYES, EARS, NOSE AND THROAT: Atraumatic, normocephalic. Extraocular muscles are intact. Pupils equal and reactive to light. Sclerae anicteric. No conjunctival injection. No oro-pharyngeal erythema.  NECK: Supple. There is no jugular venous distention. No bruits, no lymphadenopathy, no thyromegaly.  HEART: Regular  rate and rhythm,. No murmurs, no rubs, no clicks.  LUNGS: Rhonchus breath sounds at both bases ABDOMEN: Soft, flat, nontender, nondistended. Has good bowel sounds. No hepatosplenomegaly appreciated.  EXTREMITIES: No evidence of any cyanosis, clubbing, or peripheral edema.  +2 pedal and radial pulses bilaterally.  NEUROLOGIC: The patient is alert, awake, and oriented x3 with no focal motor or sensory deficits appreciated bilaterally.  SKIN: Moist and warm with no rashes appreciated.  Psych: Not anxious, depressed LN: No inguinal LN enlargement    Antibiotics   Anti-infectives    Start     Dose/Rate Route Frequency Ordered Stop   01/27/16 1800  cefTRIAXone (ROCEPHIN) 1 g in dextrose 5 % 50 mL IVPB     1 g 100 mL/hr over 30 Minutes Intravenous Every 24 hours 01/26/16 2156     01/27/16 1000  azithromycin (ZITHROMAX) tablet 250 mg     250 mg Oral Daily 01/26/16 2156     01/26/16 1930  cefTRIAXone (ROCEPHIN) 1 g in dextrose 5 % 50 mL IVPB     1 g 100 mL/hr over 30 Minutes Intravenous  Once 01/26/16 1922 01/26/16 2029   01/26/16 1930  azithromycin (ZITHROMAX) 500 mg in dextrose 5 % 250 mL IVPB     500 mg 250 mL/hr over 60 Minutes Intravenous  Once 01/26/16 1922 01/26/16 2105      Medications   Scheduled Meds: . amLODipine  10 mg Oral Daily  . aspirin EC  81 mg Oral QPM  . azithromycin  250 mg Oral Daily  . budesonide (PULMICORT) nebulizer solution  0.25 mg Nebulization BID  .  cefTRIAXone (ROCEPHIN)  IV  1 g Intravenous Q24H  . cholecalciferol  1,000 Units Oral Daily  . cloNIDine  0.1 mg Oral TID  . clopidogrel  75 mg Oral Daily  . diltiazem  120 mg Oral Daily  . docusate sodium  100 mg Oral BID  . enoxaparin (LOVENOX) injection  30 mg Subcutaneous Q24H  . famotidine  20 mg Oral Daily  . feeding supplement (ENSURE ENLIVE)  237 mL Oral Daily  . ferrous sulfate  325 mg Oral BID WC  . fluticasone  2 spray Each Nare [id number]  . ipratropium-albuterol  3 mL Nebulization Q6H  . levothyroxine  100 mcg Oral QAC breakfast  . LORazepam  0.5 mg Oral TID  . losartan  50 mg Oral Daily  . methylPREDNISolone (SOLU-MEDROL) injection  40 mg Intravenous Q8H  . multivitamin with minerals  1 tablet Oral Daily  . Oxybutynin Chloride  1 application Transdermal QHS  . polyethylene glycol  17 g Oral Daily  . sodium bicarbonate  325 mg Oral QID  . vitamin B-12  1,000 mcg Oral Daily   Continuous Infusions:  PRN Meds:.acetaminophen **OR** acetaminophen, ondansetron **OR** ondansetron (ZOFRAN) IV   Data Review:   Micro Results No results found for this or any previous visit (from the past 240 hour(s)).  Radiology Reports Dg Neck Soft Tissue  01/26/2016  CLINICAL DATA:  History of bronchitis. Worsening shortness of breath. EXAM: NECK SOFT TISSUES - 1+ VIEW COMPARISON:  None. FINDINGS: There is no evidence of retropharyngeal soft tissue swelling or epiglottic enlargement. The cervical airway is unremarkable and no radio-opaque foreign body identified. 4 mm of anterolisthesis of C3 on C4 secondary to facet disease. Degenerative disc disease with disc height loss at C4-5, C5-6 and C6-7. Thoracic aortic atherosclerosis. IMPRESSION: 1. No radiopaque foreign body in the neck soft tissues. 2.  Aortic Atherosclerosis (ICD10-170.0) Electronically Signed  By: Elige Ko   On: 01/26/2016 19:18   Ct Chest W Contrast  01/27/2016  CLINICAL DATA:  82 year old female with cough, shortness of  breath and wheezing. EXAM: CT CHEST WITH CONTRAST TECHNIQUE: Multidetector CT imaging of the chest was performed during intravenous contrast administration. CONTRAST:  75mL ISOVUE-300 IOPAMIDOL (ISOVUE-300) INJECTION 61% COMPARISON:  08/04/2011 chest CT and 01/26/2016 chest radiograph. FINDINGS: Mediastinum/Nodes: Mild cardiomegaly with biatrial enlargement. Stable trace pericardial fluid/thickening. Left main, left anterior descending, left circumflex and right coronary atherosclerosis. Atherosclerotic nonaneurysmal thoracic aorta. Normal caliber pulmonary arteries. No central pulmonary emboli. Atrophic thyroid gland with coarse left thyroid lobe calcification. Mildly patulous thoracic esophagus with fluid level. No appreciable esophageal wall thickening. Mildly enlarged 1.1 cm right paratracheal node (series 2/ image 22), unchanged since 08/04/2011. No additional pathologically enlarged mediastinal or hilar nodes. Lungs/Pleura: No pneumothorax. Small layering bilateral pleural effusions. Diffuse hazy parahilar ground-glass attenuation throughout both lungs. Mild diffuse interlobular septal thickening in both lungs. Symmetric mild biapical pleural-parenchymal scarring is not appreciably changed since 08/04/2011. Bandlike subpleural consolidation and distortion in the medial basilar right upper lobe, medial right middle lobe and lingula is unchanged, consistent with postinfectious/ postinflammatory scarring. Moderate left lower lobe and mild right lower lobe atelectasis. No significant pulmonary nodules or lung masses in the aerated portions of the lungs. Upper abdomen: Unremarkable. Musculoskeletal: No aggressive appearing focal osseous lesions. Moderate thoracic spondylosis. IMPRESSION: 1. Chest CT findings are suggestive of mild congestive heart failure, including mild cardiomegaly, small layering bilateral pleural effusions and diffuse mild parahilar ground-glass attenuation and interlobular septal thickening in  both lungs most consistent with mild pulmonary edema. 2. Bibasilar atelectasis, left greater than right. 3. Left main and 3 vessel coronary atherosclerosis. 4. Patulous thoracic esophagus with fluid level, suggesting dysmotility and/or reflux. 5. Mild mediastinal lymphadenopathy, stable since 2013, most consistent with benign reactive adenopathy. Electronically Signed   By: Delbert Phenix M.D.   On: 01/27/2016 13:09   Dg Chest Port 1 View  01/26/2016  CLINICAL DATA:  Worsening shortness of breath.  Bronchitis. EXAM: PORTABLE CHEST 1 VIEW COMPARISON:  12/11/2015 FINDINGS: Bilateral mild interstitial thickening. Small left pleural effusion. Trace right pleural effusion. No pneumothorax. Stable cardiomegaly. Thoracic aortic atherosclerosis. No acute osseous abnormality. IMPRESSION: Findings concerning for mild CHF. Aortic Atherosclerosis (ICD10-170.0) Electronically Signed   By: Elige Ko   On: 01/26/2016 19:14     CBC  Recent Labs Lab 01/26/16 1820 01/27/16 0235  WBC 15.0* 11.0  HGB 10.9* 10.3*  HCT 31.5* 29.2*  PLT 331 298  MCV 86.6 85.3  MCH 29.9 30.1  MCHC 34.5 35.3  RDW 14.2 13.6  LYMPHSABS 1.4  --   MONOABS 1.8*  --   EOSABS 0.0  --   BASOSABS 0.0  --     Chemistries   Recent Labs Lab 01/26/16 1820 01/27/16 0235  NA 121* 118*  K 4.3 4.9  CL 84* 82*  CO2 27 27  GLUCOSE 132* 211*  BUN 17 19  CREATININE 0.45 0.58  CALCIUM 8.7* 8.6*   ------------------------------------------------------------------------------------------------------------------ estimated creatinine clearance is 29.1 mL/min (by C-G formula based on Cr of 0.58). ------------------------------------------------------------------------------------------------------------------ No results for input(s): HGBA1C in the last 72 hours. ------------------------------------------------------------------------------------------------------------------ No results for input(s): CHOL, HDL, LDLCALC, TRIG, CHOLHDL,  LDLDIRECT in the last 72 hours. ------------------------------------------------------------------------------------------------------------------ No results for input(s): TSH, T4TOTAL, T3FREE, THYROIDAB in the last 72 hours.  Invalid input(s): FREET3 ------------------------------------------------------------------------------------------------------------------ No results for input(s): VITAMINB12, FOLATE, FERRITIN, TIBC, IRON, RETICCTPCT in the last 72 hours.  Coagulation profile No results for input(s): INR, PROTIME in the last 168 hours.  No results for input(s): DDIMER in the last 72 hours.  Cardiac Enzymes  Recent Labs Lab 01/26/16 1820  TROPONINI <0.03   ------------------------------------------------------------------------------------------------------------------ Invalid input(s): POCBNP    Assessment & Plan   82 year old female with past medical history of COPD, anxiety, hypertension, osteoporosis, GERD, history of previous TIA, hypothyroidism, chronic hyponatremia, atrial fibrillation and presents to the hospital due to shortness of breath.  1. Acute on chronic respiratory failure with hypoxia-this is secondary to COPD exacerbation and suspected pneumonia. Due to recurrent nature of her symptoms and chest x-ray suggestive CHF I will obtain a CT scan of the chest Continue IV antibiotics Continue nebulizer therapy   2. COPD exacerbation-suspected to be due to pneumonia. Continue Solu-Medrol, nebulizers Antibiotic therapy for pneumonia  3. History of previous TIAs-continue Plavix.  4. History of chronic atrial fibrillation-rate controlled. Continue Cardizem.  5. Essential hypertension-continue Cardizem, amlodipine, losartan.  6. Hypothyroidism-continue Synthroid.  7. GERD-continue Pepcid     Code Status Orders        Start     Ordered   01/26/16 2157  Do not attempt resuscitation (DNR)   Continuous    Question Answer Comment  In the event of  cardiac or respiratory ARREST Do not call a "code blue"   In the event of cardiac or respiratory ARREST Do not perform Intubation, CPR, defibrillation or ACLS   In the event of cardiac or respiratory ARREST Use medication by any route, position, wound care, and other measures to relive pain and suffering. May use oxygen, suction and manual treatment of airway obstruction as needed for comfort.      01/26/16 2156    Code Status History    Date Active Date Inactive Code Status Order ID Comments User Context   12/11/2015  9:29 PM 12/15/2015  8:13 PM DNR [id number]  Ramonita Lab, MD Inpatient   09/25/2015  4:17 PM 09/26/2015 10:07 PM DNR [id number]  Altamese Dilling, MD ED           Consults  None    DVT Prophylaxis  Lovenox   Lab Results  Component Value Date   PLT 298 01/27/2016     Time Spent in minutes    Greater than 50% of time spent in care coordination and counseling patient regarding the condition and plan of care.   Auburn Bilberry M.D on 01/27/2016 at 1:14 PM  Between 7am to 6pm - Pager - [phone]  After 6pm go to [email or web address] - password [email or web address] [facility]  [facility]   Office  [phone]

## 2016-01-27 NOTE — Care Management Important Message (Signed)
Important Message  Patient Details  Name: Alyssa Landry MRN: [id number] Date of Birth: 1919/01/12   Medicare Important Message Given:  Yes    Johnell Bas A, RN 01/27/2016, 9:44 AM

## 2016-01-27 NOTE — Progress Notes (Signed)
Physical Therapy Evaluation Patient Details Name: Alyssa Landry MRN: [id number] DOB: 14-Mar-1919 Today's Date: 01/27/2016   History of Present Illness  Pt admitted for acute on chronic respiratory failure with complaints of SOB. PMH includes COPD, HTN, and a-fib.  Clinical Impression  Pt is a pleasant 82 y/o female who present with decreased endurance. Pt sodium has been chronically low, discussed with RN who ok'd evaluation. Pt [facility] and only uses RW when she is feeling very sick. Pt on 1L O2 upon arrival, O2 increased to 2L due to O2 in 80s while in supine. Pt was independent with all bed mobility and required min guard for transfers and ambulation. Pt demonstrates a step-through gait pattern with gait. Pt ambulated for 180 feet with 2 seated rest breaks and 2 standing rest breaks. O2 monitored throughout session, remained in 80%-94%, difficult to assess with pulse ox. 94% with use of Dynamap. Pt demonstrated increased use of accessory muscles and but reports no chest pains/SOB. Pt will benefit from skilled PT in order to improve endurance and functional mobility. Pt is appropriate for HHPT at this time.     Follow Up Recommendations Home health PT    Equipment Recommendations       Recommendations for Other Services       Precautions / Restrictions Precautions Precautions: Fall Restrictions Weight Bearing Restrictions: No      Mobility  Bed Mobility Overal bed mobility: Independent             General bed mobility comments: Pt was independent with all bed mobility and performed safe technique. Pt required verbal cueing to scoot to EOB.  Transfers Overall transfer level: Modified independent Equipment used: Straight cane Transfers: Sit to/from Stand Sit to Stand: Modified independent (Device/Increase time);Min guard [facility](SPC)         General transfer comment: Pt demonstrates safe sit/stand transfer technique with use of SPC  requiring CGA. Pt reports no unsteadiness or LOB upon standing.   Ambulation/Gait Ambulation/Gait assistance: Modified independent (Device/Increase time);Min guard Ambulation Distance (Feet): 20 Feet Assistive device: Straight cane Gait Pattern/deviations: WFL(Within Functional Limits);Step-through pattern   Gait velocity interpretation: Below normal speed for age/gender General Gait Details: Pt requires CGA with ambulation and demonstrates a step-through gait pattern with SPC. Pt on 2L O2.  Stairs            Wheelchair Mobility    Modified Rankin (Stroke Patients Only)       Balance Overall balance assessment: Modified Independent                                           Pertinent Vitals/Pain Pain Assessment: No/denies pain    Home Living Family/patient expects to be discharged to:: Private residence Living Arrangements: Children (daughter Elease Hashimoto(Patricia)) Available Help at Discharge: Family Type of Home: House Home Access: Stairs to enter Entrance Stairs-Rails:  (Chair lift) Secretary/administratorntrance Stairs-Number of Steps: 11 Home Layout: Two level Home Equipment: Environmental consultantWalker - 2 wheels;Cane - single point      Prior Function Level of Independence: Independent with assistive device(s)         Comments: Pt reports she uses SPC at home, only uses RW when she feels really sick.      Hand Dominance        Extremity/Trunk Assessment   Upper Extremity Assessment: Overall WFL for  tasks assessed (5/5 grip strength, 5/5 elbow flex/ext)           Lower Extremity Assessment: Overall WFL for tasks assessed (5 SLRs)         Communication   Communication: No difficulties  Cognition Arousal/Alertness: Awake/alert Behavior During Therapy: WFL for tasks assessed/performed Overall Cognitive Status: Within Functional Limits for tasks assessed                      General Comments      Exercises Other Exercises Other Exercises: Gait training x 180  feet with SPC and 2L O2. Step-through gait pattern. Pt required 2 sitting rest breaks and 2 standing rest breaks. O2 monitored throughout, 80%-94% low numbers may be due to difficulty to assess sats with pulse ox. O2 appeared stable at 94% when monitored with Dynamap.       Assessment/Plan    PT Assessment Patient needs continued PT services  PT Diagnosis Difficulty walking   PT Problem List Decreased activity tolerance;Cardiopulmonary status limiting activity  PT Treatment Interventions Gait training;Functional mobility training;Therapeutic activities;Therapeutic exercise;Balance training;Patient/family education   PT Goals (Current goals can be found in the Care Plan section) Acute Rehab PT Goals Patient Stated Goal: To return home PT Goal Formulation: With patient Time For Goal Achievement: 02/10/16 Potential to Achieve Goals: Good    Frequency Min 2X/week   Barriers to discharge        Co-evaluation               End of Session Equipment Utilized During Treatment: Gait belt Activity Tolerance: Patient limited by fatigue Patient left: in chair;with call bell/phone within reach;with chair alarm set;with family/visitor present Nurse Communication: Mobility status (O2 sats)         Time: 7829-5621 PT Time Calculation (min) (ACUTE ONLY): 33 min   Charges:         PT G Codes:        Thereasa Parkin 2016/02/08, 3:39 PM  Thereasa Parkin, SPT [phone]

## 2016-01-28 LAB — BASIC METABOLIC PANEL
ANION GAP: 7 (ref 5–15)
BUN: 24 mg/dL — AB (ref 6–20)
CO2: 30 mmol/L (ref 22–32)
Calcium: 9 mg/dL (ref 8.9–10.3)
Chloride: 83 mmol/L — ABNORMAL LOW (ref 101–111)
Creatinine, Ser: 0.64 mg/dL (ref 0.44–1.00)
GFR calc Af Amer: >60 mL/min (ref >60)
Glucose, Bld: 155 mg/dL — ABNORMAL HIGH (ref 65–99)
POTASSIUM: 4.8 mmol/L (ref 3.5–5.1)
SODIUM: 120 mmol/L — AB (ref 135–145)

## 2016-01-28 LAB — CBC
HEMATOCRIT: 30.7 % — AB (ref 35.0–47.0)
Hemoglobin: 10.7 g/dL — ABNORMAL LOW (ref 12.0–16.0)
MCH: 29.8 pg (ref 26.0–34.0)
MCHC: 34.8 g/dL (ref 32.0–36.0)
MCV: 85.8 fL (ref 80.0–100.0)
Platelets: 350 10*3/uL (ref 150–440)
RBC: 3.58 MIL/uL — ABNORMAL LOW (ref 3.80–5.20)
RDW: 14 % (ref 11.5–14.5)
WBC: 21.3 10*3/uL — AB (ref 3.6–11.0)

## 2016-01-28 LAB — ECHOCARDIOGRAM COMPLETE
Height: 57 in
Weight: 1816 oz

## 2016-01-28 MED ORDER — ACETYLCYSTEINE 20 % IN SOLN: 4.0000 mL | Freq: Two times a day (BID) | RESPIRATORY_TRACT | Status: DC

## 2016-01-28 MED ORDER — GUAIFENESIN ER 600 MG PO TB12
600.0000 mg | ORAL_TABLET | Freq: Two times a day (BID) | ORAL | Status: DC
  Administered 2016-01-28 – 2016-01-30 (×5): 600 mg via ORAL
  Filled 2016-01-28 (×5): qty 1

## 2016-01-28 MED ORDER — ACETYLCYSTEINE 20 % IN SOLN
4.0000 mL | Freq: Two times a day (BID) | RESPIRATORY_TRACT | Status: DC
  Administered 2016-01-28 – 2016-01-30 (×4): 4 mL via RESPIRATORY_TRACT
  Filled 2016-01-28 (×4): qty 4

## 2016-01-28 MED ORDER — METHYLPREDNISOLONE SODIUM SUCC 40 MG IJ SOLR
40.0000 mg | Freq: Two times a day (BID) | INTRAMUSCULAR | Status: DC
  Administered 2016-01-28 – 2016-01-29 (×2): 40 mg via INTRAVENOUS
  Filled 2016-01-28 (×2): qty 1

## 2016-01-28 NOTE — Progress Notes (Signed)
[facility] -  at [facility]                                                                                                                                                                                            Patient Demographics   Alyssa Landry, is a 82 y.o. female, DOB - Nov 01, 1918, ZOX:[id number]  Admit date - 01/26/2016   Admitting Physician Houston Siren, MD  Outpatient Primary MD for the patient is Rafael Bihari, MD   LOS - 2  Subjective: Patient reports that she is unable to break up her cough feels like stuck. Denies any chest pains or palpitations    Review of Systems:   CONSTITUTIONAL: No documented fever. No fatigue, weakness. No weight gain, no weight loss.  EYES: No blurry or double vision.  ENT: No tinnitus. No postnasal drip. No redness of the oropharynx.  RESPIRATORY: Positive cough, no wheeze, no hemoptysis.Positive dyspnea.  CARDIOVASCULAR: No chest pain. No orthopnea. No palpitations. No syncope.  GASTROINTESTINAL: No nausea, no vomiting or diarrhea. No abdominal pain. No melena or hematochezia.  GENITOURINARY: No dysuria or hematuria.  ENDOCRINE: No polyuria or nocturia. No heat or cold intolerance.  HEMATOLOGY: No anemia. No bruising. No bleeding.  INTEGUMENTARY: No rashes. No lesions.  MUSCULOSKELETAL: No arthritis. No swelling. No gout.  NEUROLOGIC: No numbness, tingling, or ataxia. No seizure-type activity.  PSYCHIATRIC: No anxiety. No insomnia. No ADD.    Vitals:   Filed Vitals:   01/27/16 2110 01/27/16 2149 01/28/16 0217 01/28/16 0538  BP: 137/49 128/34  137/48  Pulse: 84 68  60  Temp: 97.7 F (36.5 C) 97.8 F (36.6 C)    TempSrc: Oral Oral    Resp: 22 16  18   Height:      Weight:      SpO2: 94% 90% 92% 93%    Wt Readings from Last 3 Encounters:  01/26/16 51.483 kg (113 lb 8 oz)  12/11/15 50.5 kg (111 lb 5.3 oz)  09/25/15 46.358 kg (102 lb 3.2 oz)     Intake/Output Summary (Last 24 hours)  at 01/28/16 1133 Last data filed at 01/28/16 0900  Gross per 24 hour  Intake    360 ml  Output      0 ml  Net    360 ml    Physical Exam:   GENERAL: Pleasant-appearing in no apparent distress.  HEAD, EYES, EARS, NOSE AND THROAT: Atraumatic, normocephalic. Extraocular muscles are intact. Pupils equal and reactive to light. Sclerae anicteric. No conjunctival injection. No oro-pharyngeal erythema.  NECK: Supple. There is no jugular venous distention. No bruits, no  lymphadenopathy, no thyromegaly.  HEART: Regular rate and rhythm,. No murmurs, no rubs, no clicks.  LUNGS: Rhonchus breath sounds at both bases no cracles ABDOMEN: Soft, flat, nontender, nondistended. Has good bowel sounds. No hepatosplenomegaly appreciated.  EXTREMITIES: No evidence of any cyanosis, clubbing, or peripheral edema.  +2 pedal and radial pulses bilaterally.  NEUROLOGIC: The patient is alert, awake, and oriented x3 with no focal motor or sensory deficits appreciated bilaterally.  SKIN: Moist and warm with no rashes appreciated.  Psych: Not anxious, depressed LN: No inguinal LN enlargement    Antibiotics   Anti-infectives    Start     Dose/Rate Route Frequency Ordered Stop   01/27/16 1800  cefTRIAXone (ROCEPHIN) 1 g in dextrose 5 % 50 mL IVPB     1 g 100 mL/hr over 30 Minutes Intravenous Every 24 hours 01/26/16 2156     01/27/16 1000  azithromycin (ZITHROMAX) tablet 250 mg     250 mg Oral Daily 01/26/16 2156     01/26/16 1930  cefTRIAXone (ROCEPHIN) 1 g in dextrose 5 % 50 mL IVPB     1 g 100 mL/hr over 30 Minutes Intravenous  Once 01/26/16 1922 01/26/16 2029   01/26/16 1930  azithromycin (ZITHROMAX) 500 mg in dextrose 5 % 250 mL IVPB     500 mg 250 mL/hr over 60 Minutes Intravenous  Once 01/26/16 1922 01/26/16 2105      Medications   Scheduled Meds: . amLODipine  10 mg Oral Daily  . aspirin EC  81 mg Oral QPM  . azithromycin  250 mg Oral Daily  . budesonide (PULMICORT) nebulizer solution  0.25 mg  Nebulization BID  . cefTRIAXone (ROCEPHIN)  IV  1 g Intravenous Q24H  . cholecalciferol  1,000 Units Oral Daily  . cloNIDine  0.1 mg Oral TID  . clopidogrel  75 mg Oral Daily  . diltiazem  120 mg Oral Daily  . docusate sodium  100 mg Oral BID  . enoxaparin (LOVENOX) injection  30 mg Subcutaneous Q24H  . famotidine  20 mg Oral Daily  . feeding supplement (ENSURE ENLIVE)  237 mL Oral Daily  . ferrous sulfate  325 mg Oral BID WC  . fluticasone  2 spray Each Nare [id number]  . furosemide  20 mg Intravenous Q12H  . ipratropium-albuterol  3 mL Nebulization Q6H  . levothyroxine  100 mcg Oral QAC breakfast  . LORazepam  0.5 mg Oral TID  . losartan  50 mg Oral Daily  . methylPREDNISolone (SOLU-MEDROL) injection  40 mg Intravenous Q8H  . multivitamin with minerals  1 tablet Oral Daily  . Oxybutynin Chloride  1 application Transdermal QHS  . polyethylene glycol  17 g Oral Daily  . sodium bicarbonate  325 mg Oral QID  . vitamin B-12  1,000 mcg Oral Daily   Continuous Infusions:  PRN Meds:.acetaminophen **OR** acetaminophen, ondansetron **OR** ondansetron (ZOFRAN) IV   Data Review:   Micro Results Recent Results (from the past 240 hour(s))  Culture, blood (routine x 2)     Status: None (Preliminary result)   Collection Time: 01/26/16  6:45 PM  Result Value Ref Range Status   Specimen Description BLOOD LEFT ARM  Final   Special Requests BOTTLES DRAWN AEROBIC AND ANAEROBIC  3CC  Final   Culture NO GROWTH 2 DAYS  Final   Report Status PENDING  Incomplete  Culture, blood (routine x 2)     Status: None (Preliminary result)   Collection Time: 01/26/16  7:50 PM  Result  Value Ref Range Status   Specimen Description BLOOD RIGHT ARM  Final   Special Requests   Final    BOTTLES DRAWN AEROBIC AND ANAEROBIC 25CCAERO,23CCANA   Culture NO GROWTH 2 DAYS  Final   Report Status PENDING  Incomplete    Radiology Reports Dg Neck Soft Tissue  01/26/2016  CLINICAL DATA:  History of bronchitis. Worsening  shortness of breath. EXAM: NECK SOFT TISSUES - 1+ VIEW COMPARISON:  None. FINDINGS: There is no evidence of retropharyngeal soft tissue swelling or epiglottic enlargement. The cervical airway is unremarkable and no radio-opaque foreign body identified. 4 mm of anterolisthesis of C3 on C4 secondary to facet disease. Degenerative disc disease with disc height loss at C4-5, C5-6 and C6-7. Thoracic aortic atherosclerosis. IMPRESSION: 1. No radiopaque foreign body in the neck soft tissues. 2.  Aortic Atherosclerosis (ICD10-170.0) Electronically Signed   By: Elige Ko   On: 01/26/2016 19:18   Ct Chest W Contrast  01/27/2016  CLINICAL DATA:  82 year old female with cough, shortness of breath and wheezing. EXAM: CT CHEST WITH CONTRAST TECHNIQUE: Multidetector CT imaging of the chest was performed during intravenous contrast administration. CONTRAST:  75mL ISOVUE-300 IOPAMIDOL (ISOVUE-300) INJECTION 61% COMPARISON:  08/04/2011 chest CT and 01/26/2016 chest radiograph. FINDINGS: Mediastinum/Nodes: Mild cardiomegaly with biatrial enlargement. Stable trace pericardial fluid/thickening. Left main, left anterior descending, left circumflex and right coronary atherosclerosis. Atherosclerotic nonaneurysmal thoracic aorta. Normal caliber pulmonary arteries. No central pulmonary emboli. Atrophic thyroid gland with coarse left thyroid lobe calcification. Mildly patulous thoracic esophagus with fluid level. No appreciable esophageal wall thickening. Mildly enlarged 1.1 cm right paratracheal node (series 2/ image 22), unchanged since 08/04/2011. No additional pathologically enlarged mediastinal or hilar nodes. Lungs/Pleura: No pneumothorax. Small layering bilateral pleural effusions. Diffuse hazy parahilar ground-glass attenuation throughout both lungs. Mild diffuse interlobular septal thickening in both lungs. Symmetric mild biapical pleural-parenchymal scarring is not appreciably changed since 08/04/2011. Bandlike subpleural  consolidation and distortion in the medial basilar right upper lobe, medial right middle lobe and lingula is unchanged, consistent with postinfectious/ postinflammatory scarring. Moderate left lower lobe and mild right lower lobe atelectasis. No significant pulmonary nodules or lung masses in the aerated portions of the lungs. Upper abdomen: Unremarkable. Musculoskeletal: No aggressive appearing focal osseous lesions. Moderate thoracic spondylosis. IMPRESSION: 1. Chest CT findings are suggestive of mild congestive heart failure, including mild cardiomegaly, small layering bilateral pleural effusions and diffuse mild parahilar ground-glass attenuation and interlobular septal thickening in both lungs most consistent with mild pulmonary edema. 2. Bibasilar atelectasis, left greater than right. 3. Left main and 3 vessel coronary atherosclerosis. 4. Patulous thoracic esophagus with fluid level, suggesting dysmotility and/or reflux. 5. Mild mediastinal lymphadenopathy, stable since 2013, most consistent with benign reactive adenopathy. Electronically Signed   By: Delbert Phenix M.D.   On: 01/27/2016 13:09   Dg Chest Port 1 View  01/26/2016  CLINICAL DATA:  Worsening shortness of breath.  Bronchitis. EXAM: PORTABLE CHEST 1 VIEW COMPARISON:  12/11/2015 FINDINGS: Bilateral mild interstitial thickening. Small left pleural effusion. Trace right pleural effusion. No pneumothorax. Stable cardiomegaly. Thoracic aortic atherosclerosis. No acute osseous abnormality. IMPRESSION: Findings concerning for mild CHF. Aortic Atherosclerosis (ICD10-170.0) Electronically Signed   By: Elige Ko   On: 01/26/2016 19:14     CBC  Recent Labs Lab 01/26/16 1820 01/27/16 0235 01/28/16 0545  WBC 15.0* 11.0 21.3*  HGB 10.9* 10.3* 10.7*  HCT 31.5* 29.2* 30.7*  PLT 331 298 350  MCV 86.6 85.3 85.8  MCH 29.9 30.1 29.8  MCHC 34.5 35.3 34.8  RDW 14.2 13.6 14.0  LYMPHSABS 1.4  --   --   MONOABS 1.8*  --   --   EOSABS 0.0  --   --    BASOSABS 0.0  --   --     Chemistries   Recent Labs Lab 01/26/16 1820 01/27/16 0235 01/28/16 0545  NA 121* 118* 120*  K 4.3 4.9 4.8  CL 84* 82* 83*  CO2 GLUCOSE 132* 211* 155*  BUN 17 19 24*  CREATININE 0.45 0.58 0.64  CALCIUM 8.7* 8.6* 9.0   ------------------------------------------------------------------------------------------------------------------ estimated creatinine clearance is 29.1 mL/min (by C-G formula based on Cr of 0.64). ------------------------------------------------------------------------------------------------------------------ No results for input(s): HGBA1C in the last 72 hours. ------------------------------------------------------------------------------------------------------------------ No results for input(s): CHOL, HDL, LDLCALC, TRIG, CHOLHDL, LDLDIRECT in the last 72 hours. ------------------------------------------------------------------------------------------------------------------ No results for input(s): TSH, T4TOTAL, T3FREE, THYROIDAB in the last 72 hours.  Invalid input(s): FREET3 ------------------------------------------------------------------------------------------------------------------ No results for input(s): VITAMINB12, FOLATE, FERRITIN, TIBC, IRON, RETICCTPCT in the last 72 hours.  Coagulation profile No results for input(s): INR, PROTIME in the last 168 hours.  No results for input(s): DDIMER in the last 72 hours.  Cardiac Enzymes  Recent Labs Lab 01/26/16 1820  TROPONINI <0.03   ------------------------------------------------------------------------------------------------------------------ Invalid input(s): POCBNP    Assessment & Plan   82 year old female with past medical history of COPD, anxiety, hypertension, osteoporosis, GERD, history of previous TIA, hypothyroidism, chronic hyponatremia, atrial fibrillation and presents to the hospital due to shortness of breath.  1. Acute on chronic  respiratory failure with hypoxia-this is secondary to COPD exacerbation and suspected pneumonia.  CT scan with IV contrast reviewed from yesterday that suggested possible congestive heart failure. Patient did have some response yesterday to Lasix. However her echocardiogram is normal. We will continue to try Lasix IV 1 more day I will have nurse recorded ins and outs. Continue therapy for COPD exacerbation and pneumonia.   2. COPD exacerbation-suspected to be due to pneumonia. Continue Solu-Medrol, nebulizers Antibiotic therapy for pneumonia Add mucomyst to current regimen W BC elevated due to steroids I will decrease the doses of steroid frequency 3. History of previous TIAs-continue Plavix.  4. History of chronic atrial fibrillation-rate controlled. Continue Cardizem.  5. Essential hypertension-continue Cardizem, amlodipine, losartan.  6. Hypothyroidism-continue Synthroid.  7. GERD-continue Pepcid  8. Hyponatremia chronic in nature improved her fluid restriction was increased yesterday to 1500 cc. Continue to monitor     Code Status Orders        Start     Ordered   01/26/16 2157  Do not attempt resuscitation (DNR)   Continuous    Question Answer Comment  In the event of cardiac or respiratory ARREST Do not call a "code blue"   In the event of cardiac or respiratory ARREST Do not perform Intubation, CPR, defibrillation or ACLS   In the event of cardiac or respiratory ARREST Use medication by any route, position, wound care, and other measures to relive pain and suffering. May use oxygen, suction and manual treatment of airway obstruction as needed for comfort.      01/26/16 2156    Code Status History    Date Active Date Inactive Code Status Order ID Comments User Context   12/11/2015  9:29 PM 12/15/2015  8:13 PM DNR [id number]  Ramonita Lab, MD Inpatient   09/25/2015  4:17 PM 09/26/2015 10:07 PM DNR [id number]  Altamese Dilling, MD ED     Case discussed with the daughter at  bedside      Consults  None    DVT Prophylaxis  Lovenox   Lab Results  Component Value Date   PLT 350 01/28/2016     Time Spent in minutes   35min  Greater than 50% of time spent in care coordination and counseling patient regarding the condition and plan of care.   Auburn BilberryPATEL, Shyann Hefner M.D on 01/28/2016 at 11:33 AM  Between 7am to 6pm - Pager - [phone]  After 6pm go to [email or web address] - password [email or web address] [facility]  [facility]   Office  [phone]

## 2016-01-28 NOTE — Progress Notes (Signed)
1800 Good day. Alert and oriented. Ate well until dinner. Tires easily especially with coughing. Up to bathroom with help. Walked with PT. Gait steady. Daughter with her until dinner time.

## 2016-01-28 NOTE — Progress Notes (Signed)
Physical Therapy Treatment Patient Details Name: Alyssa Landry MRN: [id number] DOB: [birth year] Today's Date: 01/28/2016    History of Present Illness Pt admitted for acute on chronic respiratory failure with complaints of SOB. PMH includes COPD, HTN, and a-fib.    PT Comments    Checked with nursing due to sodium 120 - OK received for therapy.  Bed mobility without assist and good safety.  Sats 93% on lpm O2.  She was able to ambulate with SPC 150' with slow cautious gait.  Upon return to room sats 88% on 1lpm.  After short rest returned to 93%.  Pt stated she has SPC and walker at home.  Trial of walker 100' with overall improved gait quality and speed.  Pt may benefit from walker use initially upon discharge until strength and endurance improve.  Walker may help with energy conservation to maintain O2 sats as well.  Pt and daughter agree that gait was improved with walker.  Assisted to bathroom per her request with min guard.  CNA notified of urine output.    Follow Up Recommendations  Home health PT     Equipment Recommendations       Recommendations for Other Services       Precautions / Restrictions Precautions Precautions: Fall Restrictions Weight Bearing Restrictions: No    Mobility  Bed Mobility Overal bed mobility: Independent                Transfers Overall transfer level: Modified independent Equipment used: Rolling walker (2 wheeled);Straight cane Transfers: Sit to/from Stand Sit to Stand: Modified independent (Device/Increase time);Min guard            Ambulation/Gait Ambulation/Gait assistance: Min guard Ambulation Distance (Feet): 150 Feet (then 100' with walker) Assistive device: Straight cane;Rolling walker (2 wheeled) Gait Pattern/deviations: Step-through pattern;Decreased step length - right;Decreased step length - left;Narrow base of support   Gait velocity interpretation: Below normal speed for age/gender General Gait Details: gait  improved with walker   Stairs            Wheelchair Mobility    Modified Rankin (Stroke Patients Only)       Balance Overall balance assessment: Needs assistance Sitting-balance support: Feet supported Sitting balance-Leahy Scale: Good     Standing balance support: No upper extremity supported Standing balance-Leahy Scale: Fair                      Cognition Arousal/Alertness: Awake/alert Behavior During Therapy: WFL for tasks assessed/performed Overall Cognitive Status: Within Functional Limits for tasks assessed                      Exercises      General Comments        Pertinent Vitals/Pain Pain Assessment: No/denies pain    Home Living                      Prior Function            PT Goals (current goals can now be found in the care plan section) Acute Rehab PT Goals Patient Stated Goal: To return home    Frequency  Min 2X/week    PT Plan Current plan remains appropriate    Co-evaluation             End of Session Equipment Utilized During Treatment: Gait belt Activity Tolerance: Patient limited by fatigue Patient left: in bed;with call bell/phone within reach;with bed alarm  set;with family/visitor present     Time: 1345-1405 PT Time Calculation (min) (ACUTE ONLY): 20 min  Charges:  $Gait Training: 8-22 mins                    G Codes:      Danielle DessSarah Kass Herberger, PTA 01/28/2016, 2:40 PM

## 2016-01-29 LAB — CBC
HEMATOCRIT: 33.5 % — AB (ref 35.0–47.0)
Hemoglobin: 11.6 g/dL — ABNORMAL LOW (ref 12.0–16.0)
MCH: 29.8 pg (ref 26.0–34.0)
MCHC: 34.6 g/dL (ref 32.0–36.0)
MCV: 86.1 fL (ref 80.0–100.0)
PLATELETS: 423 10*3/uL (ref 150–440)
RBC: 3.89 MIL/uL (ref 3.80–5.20)
RDW: 14.2 % (ref 11.5–14.5)
WBC: 20.6 10*3/uL — AB (ref 3.6–11.0)

## 2016-01-29 LAB — BASIC METABOLIC PANEL
Anion gap: 9 (ref 5–15)
BUN: 28 mg/dL — ABNORMAL HIGH (ref 6–20)
CHLORIDE: 82 mmol/L — AB (ref 101–111)
CO2: 30 mmol/L (ref 22–32)
CREATININE: 0.61 mg/dL (ref 0.44–1.00)
Calcium: 8.8 mg/dL — ABNORMAL LOW (ref 8.9–10.3)
Glucose, Bld: 128 mg/dL — ABNORMAL HIGH (ref 65–99)
POTASSIUM: 4.9 mmol/L (ref 3.5–5.1)
SODIUM: 121 mmol/L — AB (ref 135–145)

## 2016-01-29 MED ORDER — BOOST PLUS PO LIQD
237.0000 mL | Freq: Two times a day (BID) | ORAL | Status: DC
  Administered 2016-01-30 (×2): 237 mL via ORAL
  Filled 2016-01-29: qty 237

## 2016-01-29 MED ORDER — ONDANSETRON HCL 4 MG PO TABS
ORAL_TABLET | ORAL | Status: AC
  Administered 2016-01-29: 08:00:00 4 mg via ORAL
  Filled 2016-01-29: qty 1

## 2016-01-29 MED ORDER — METHYLPREDNISOLONE SODIUM SUCC 40 MG IJ SOLR
40.0000 mg | Freq: Every day | INTRAMUSCULAR | Status: DC
  Administered 2016-01-30: 40 mg via INTRAVENOUS
  Filled 2016-01-29: qty 1

## 2016-01-29 NOTE — Progress Notes (Signed)
Notified Dr. Cherlynn KaiserSainani via telephone that bp is 125/47 and lasix is ordered 20 mg bid, per MD okay to proceed with lasix with given blood pressure.

## 2016-01-29 NOTE — Progress Notes (Addendum)
Patient is alert and oriented, up to bathroom with stand by assist, continues on oxygen, desaturated on room air when ambulating with physical therapy, c/o nausea in am improved with po zofran, fair appetite, likely to d/c to home on 7/20. On po antibiotics and steroids, wbc remain elevated,  tolerating activity. Blood pressure slightly hypotensive, MD aware, continues on IV lasix bid.

## 2016-01-29 NOTE — Care Management Important Message (Signed)
Important Message  Patient Details  Name: Alyssa Landry MRN: [id number] Date of Birth: January 04, 1919   Medicare Important Message Given:  Yes    Gwenette [name], RN 01/29/2016, 3:32 PM

## 2016-01-29 NOTE — Progress Notes (Signed)
Physical Therapy Treatment Patient Details Name: Alyssa Landry MRN: [id number] DOB: 06-24-1919 Today's Date: 01/29/2016    History of Present Illness Pt admitted for acute on chronic respiratory failure with complaints of SOB. PMH includes COPD, HTN, and a-fib.    PT Comments    Pt agreeable to thereapy, c/o increased nausea this am, pt on 2L O2.  Discussed with pt continued use of RW for energy conservation at this time.  Pt performed bed mobility and sit to stand transfers with good safety Initial SpO2 95%, ambulated 195f with pt stating needing to void.  Returned to toilet, after toileting and pt sitting on EOB SpO2 93%.  MD entered room requesting trial of gait with RA.  Pt desat to low 80% upon standing however able to recover within 30sec with cues for diaphragmatic breathing.  Pt then ambulated 41fwith desat to 81%, pt unable to recover above 85% after 1 min. Supplemental O2 1L added, pt recovered to 93% after approx 30 seconds.  Pt ambulated 4535fo return to room with SpO2 decreased to 85% after gait.  Pt returned to bed with SpO2 at end of session 96%.  Pt in bed with all current needs met.   Follow Up Recommendations  Home health PT     Equipment Recommendations       Recommendations for Other Services       Precautions / Restrictions Precautions Precautions: Fall Restrictions Weight Bearing Restrictions: No    Mobility  Bed Mobility Overal bed mobility: Independent                Transfers Overall transfer level: Modified independent Equipment used: Rolling walker (2 wheeled) Transfers: Sit to/from Stand Sit to Stand: Modified independent (Device/Increase time)            Ambulation/Gait Ambulation/Gait assistance: Modified independent (Device/Increase time) Ambulation Distance (Feet): 100 Feet Assistive device: Rolling walker (2 wheeled) Gait Pattern/deviations: Step-through pattern   Gait velocity interpretation: Below normal speed for  age/gender General Gait Details: , SpO2> 90% with 2L O2, desat on tritration trial    Stairs            Wheelchair Mobility    Modified Rankin (Stroke Patients Only)       Balance Overall balance assessment: Needs assistance Sitting-balance support: Feet supported Sitting balance-Leahy Scale: Good     Standing balance support: Bilateral upper extremity supported Standing balance-Leahy Scale: Good                      Cognition Arousal/Alertness: Awake/alert Behavior During Therapy: WFL for tasks assessed/performed Overall Cognitive Status: Within Functional Limits for tasks assessed                      Exercises      General Comments        Pertinent Vitals/Pain Pain Assessment: No/denies pain    Home Living                      Prior Function            PT Goals (current goals can now be found in the care plan section) Acute Rehab PT Goals Patient Stated Goal: To return home Progress towards PT goals: Progressing toward goals    Frequency  Min 2X/week    PT Plan Current plan remains appropriate    Co-evaluation             End  of Session Equipment Utilized During Treatment: Gait belt Activity Tolerance: Patient tolerated treatment well Patient left: in bed;with call bell/phone within reach;with bed alarm set;with family/visitor present     Time: 8850-2774 PT Time Calculation (min) (ACUTE ONLY): 35 min  Charges:  $Gait Training: 8-22 mins $Therapeutic Activity: 8-22 mins                    G Codes:      Alyssa Landry Feb 07, 2016, 11:41 AM Wilmot Quevedo, PTA

## 2016-01-29 NOTE — Progress Notes (Signed)
At 18:30 patient notes she she feels "hot" similar to hot flash, denies pain, vital signs stable, but notes that nose feels dry, hydration added to oxygen. Will continue to monitor, patient encouraged to report any changes to RN, daughter at bedside.

## 2016-01-29 NOTE — Progress Notes (Signed)
[facility] - [place] at [facility]   PATIENT NAME: Alyssa Landry    MR#:  [id number]  DATE OF BIRTH:  1919-07-10  SUBJECTIVE:   Patient still having some significant shortness of breath with exertion. O2 sats dropped to the mid 80s on exertion. Denies any other complaints presently. Sodium levels to 121.  REVIEW OF SYSTEMS:    Review of Systems  Constitutional: Negative for fever and chills.  HENT: Negative for congestion and tinnitus.   Eyes: Negative for blurred vision and double vision.  Respiratory: Positive for shortness of breath. Negative for cough and wheezing.   Cardiovascular: Negative for chest pain, orthopnea and PND.  Gastrointestinal: Negative for nausea, vomiting, abdominal pain and diarrhea.  Genitourinary: Negative for dysuria and hematuria.  Neurological: Negative for dizziness, sensory change and focal weakness.  All other systems reviewed and are negative.   Nutrition: Renal with fluid restriction Tolerating Diet: Yes Tolerating PT: Yes   DRUG ALLERGIES:   Allergies  Allergen Reactions  . Codeine Other (See Comments)    Reaction:  Unknown   . Ditropan [Oxybutynin] Other (See Comments)    Reaction:  Unknown   . Hctz [Hydrochlorothiazide] Other (See Comments)    Reaction:  Causes pts sodium/potassium levels to drop significantly   . Metronidazole Other (See Comments)    Reaction:  Unknown   . Propranolol Other (See Comments)    Reaction:  Unknown   . Tavist [Clemastine] Other (See Comments)    Reaction:  Unknown   . Tramadol Other (See Comments)    Reaction:  Unknown   . Vicodin [Hydrocodone-Acetaminophen] Other (See Comments)    Reaction:  Unknown     VITALS:  Blood pressure 136/79, pulse 80, temperature 97.6 F (36.4 C), temperature source Oral, resp. rate 16, height  (1.448 m), weight 51.483 kg (113 lb 8 oz), SpO2 81 %.  PHYSICAL EXAMINATION:   Physical Exam  GENERAL:  82 y.o.-year-old patient lying in the bed in  no acute distress.  EYES: Pupils equal, round, reactive to light and accommodation. No scleral icterus. Extraocular muscles intact.  HEENT: Head atraumatic, normocephalic. Oropharynx and nasopharynx clear.  NECK:  Supple, no jugular venous distention. No thyroid enlargement, no tenderness.  LUNGS: Prolonged Insp. & exp. phase, no wheezing, rales, rhonchi. No use of accessory muscles of respiration.  CARDIOVASCULAR: S1, S2 normal. No murmurs, rubs, or gallops.  ABDOMEN: Soft, nontender, nondistended. Bowel sounds present. No organomegaly or mass.  EXTREMITIES: No cyanosis, clubbing or edema b/l.    NEUROLOGIC: Cranial nerves II through XII are intact. No focal Motor or sensory deficits b/l.   PSYCHIATRIC: The patient is alert and oriented x 3.  SKIN: No obvious rash, lesion, or ulcer.    LABORATORY PANEL:   CBC  Recent Labs Lab 01/29/16 0403  WBC 20.6*  HGB 11.6*  HCT 33.5*  PLT 423   ------------------------------------------------------------------------------------------------------------------  Chemistries   Recent Labs Lab 01/29/16 0403  NA 121*  K 4.9  CL 82*  CO2 30  GLUCOSE 128*  BUN 28*  CREATININE 0.61  CALCIUM 8.8*   ------------------------------------------------------------------------------------------------------------------  Cardiac Enzymes  Recent Labs Lab 01/26/16 1820  TROPONINI <0.03   ------------------------------------------------------------------------------------------------------------------  RADIOLOGY:  No results found.   ASSESSMENT AND PLAN:   82 year old female with past medical history of COPD, anxiety, hypertension, osteoporosis, GERD, history of previous TIA, hypothyroidism, chronic hyponatremia, atrial fibrillation and presents to the hospital due to shortness of breath.  1. Acute on chronic respiratory failure with hypoxia-this  is secondary to COPD exacerbation/Pneumonia/and also mild CHF.   - cont. IV Lasix for CHF and  continue IV steroids but tapered some more. Continue duo nebs, Pulmicort nebs, IV ceftriaxone and Zithromax underlying COPD exacerbation. -Patient was assessed for home oxygen will likely need it upon discharge.  2. CHF-acute on chronic diastolic dysfunction. -Patient's ejection fraction is 55-60%. Continue diuresis with Lasix, follow I's and O's and daily weights. -Continue Cardizem.  3. COPD exacerbation-suspected to be due to pneumonia. - cont. IV steroids but will taper, cont. scheduled DuoNeb's, Pulmicort nebs, empiric IV ceftriaxone, Zithromax. - pt. Will need Home O2 prior to discharge.   4. Leukocytosis - likely steroid mediated. - will monitor.  Trending down.   5. History of previous TIAs-continue Plavix.  5. History of chronic atrial fibrillation-rate controlled. Continue Cardizem.  6. Essential hypertension-continue Cardizem, amlodipine, losartan.  7. Hypothyroidism-continue Synthroid.  8. GERD-continue Pepcid.   All the records are reviewed and case discussed with Care Management/Social Workerr. Management plans discussed with the patient, family and they are in agreement.  CODE STATUS: DNR  DVT Prophylaxis: Lovenox  TOTAL TIME TAKING CARE OF THIS PATIENT: 30 minutes.   POSSIBLE D/C IN 1-2 DAYS, DEPENDING ON CLINICAL CONDITION.   Houston SirenSAINANI,Jasmeet Manton J M.D on 01/29/2016 at 12:55 PM  Between 7am to 6pm - Pager - [phone]  After 6pm go to [email or web address] - password [email or web address] [facility]  [facility]  Office  [phone]  CC: Primary care physician; Rafael BihariWALKER III, JOHN B, MD

## 2016-01-30 LAB — BASIC METABOLIC PANEL
Anion gap: 9 (ref 5–15)
BUN: 23 mg/dL — ABNORMAL HIGH (ref 6–20)
CALCIUM: 8.4 mg/dL — AB (ref 8.9–10.3)
CHLORIDE: 82 mmol/L — AB (ref 101–111)
CO2: 35 mmol/L — AB (ref 22–32)
Creatinine, Ser: 0.52 mg/dL (ref 0.44–1.00)
GFR calc Af Amer: >60 mL/min (ref >60)
GFR calc non Af Amer: >60 mL/min (ref >60)
GLUCOSE: 92 mg/dL (ref 65–99)
POTASSIUM: 4.3 mmol/L (ref 3.5–5.1)
Sodium: 126 mmol/L — ABNORMAL LOW (ref 135–145)

## 2016-01-30 LAB — CBC
HEMATOCRIT: 35.6 % (ref 35.0–47.0)
HEMOGLOBIN: 12.3 g/dL (ref 12.0–16.0)
MCH: 29.5 pg (ref 26.0–34.0)
MCHC: 34.5 g/dL (ref 32.0–36.0)
MCV: 85.3 fL (ref 80.0–100.0)
Platelets: 403 10*3/uL (ref 150–440)
RBC: 4.17 MIL/uL (ref 3.80–5.20)
RDW: 14 % (ref 11.5–14.5)
WBC: 17.5 10*3/uL — ABNORMAL HIGH (ref 3.6–11.0)

## 2016-01-30 LAB — GLUCOSE, CAPILLARY: GLUCOSE-CAPILLARY: 87 mg/dL (ref 65–99)

## 2016-01-30 MED ORDER — FUROSEMIDE 20 MG PO TABS: 20.0000 mg | ORAL_TABLET | Freq: Every day | ORAL | Status: DC

## 2016-01-30 MED ORDER — PREDNISONE 10 MG PO TABS: ORAL_TABLET | ORAL | Status: DC

## 2016-01-30 MED ORDER — LEVOFLOXACIN 500 MG PO TABS: 500.0000 mg | ORAL_TABLET | Freq: Every day | ORAL | Status: DC

## 2016-01-30 NOTE — Discharge Summary (Signed)
[facility] - [place] at [facility]   PATIENT NAME: Alyssa Landry    MR#:  [id number]  DATE OF BIRTH:  [birth year]  DATE OF ADMISSION:  01/26/2016 ADMITTING PHYSICIAN: Houston Siren, MD  DATE OF DISCHARGE: 01/30/2016  PRIMARY CARE PHYSICIAN: Rafael Bihari, MD    ADMISSION DIAGNOSIS:  Cough [R05] Respiratory distress [R06.00]  DISCHARGE DIAGNOSIS:  Active Problems:   Acute on chronic respiratory failure with hypoxia (HCC)   SECONDARY DIAGNOSIS:   Past Medical History  Diagnosis Date  . Hypertension   . COPD (chronic obstructive pulmonary disease) (HCC)   . TIA (transient ischemic attack)   . Acid reflux   . Arthritis   . Hyperlipidemia   . Anxiety   . Hypothyroidism   . Hiatal hernia   . Asthma   . Incontinence   . Osteoporosis, post-menopausal   . Chronic hyponatremia   . SIADH (syndrome of inappropriate ADH production) (HCC)   . GERD (gastroesophageal reflux disease)   . Hiatal hernia   . Migraine   . Anemia   . Pulmonary fibrosis (HCC)   . Persistent cough   . A-fib [facility])     HOSPITAL COURSE:   82 year old female with past medical history of COPD, anxiety, hypertension, osteoporosis, GERD, history of previous TIA, hypothyroidism, chronic hyponatremia, atrial fibrillation and presents to the hospital due to shortness of breath.  1. Acute on chronic respiratory failure with hypoxia-this was secondary to COPD exacerbation/Pneumonia/and also mild CHF.  -Patient was diuresed with IV Lasix and also given IV steroids, nebulized treatments, empiric antibiotics and has improved. -She is being discharged now on oral Lasix and prednisone taper and empiric antibiotics. She did qualify for home oxygen which is being arranged for her prior to discharge.  2. CHF-acute on chronic diastolic dysfunction. - pt. Was diuresed w/ IV lasix and has improved and now being discharged on Oral Lasix.   -Patient's echo showed ejection fraction is 55-60%. She  will cont. Continue Cardizem.  3. COPD exacerbation-this was due to pneumonia.  - Initially patient was treated with IV steroids which was then eventually tapered to oral prednisone. She was also treated with scheduled DuoNeb's Pulmicort nebs also empirically on ceftriaxone and Zithromax. -She will take a few days of Levaquin upon discharge. She also qualified for home oxygen which is being arranged for her prior to discharge.  4. Hyponatremia, chronic for the patient. Sodium up to 126 today. She will continue fluid restricted diet. This is related to SIADH.  5. Leukocytosis - steroid mediated. - improved and trending down.   6. History of previous TIAs- she will continue Plavix.  7. History of chronic atrial fibrillation- this remained rate controlled while in the hospital.  - Continue Cardizem.  8. Essential hypertension- she will continue Cardizem, amlodipine, losartan.  9. Hypothyroidism- she will continue Synthroid.  Pt. Is being arranged for home health PT, RN prior to discharge.   DISCHARGE CONDITIONS:   Stable  CONSULTS OBTAINED:     DRUG ALLERGIES:   Allergies  Allergen Reactions  . Codeine Other (See Comments)    Reaction:  Unknown   . Ditropan [Oxybutynin] Other (See Comments)    Reaction:  Unknown   . Hctz [Hydrochlorothiazide] Other (See Comments)    Reaction:  Causes pts sodium/potassium levels to drop significantly   . Metronidazole Other (See Comments)    Reaction:  Unknown   . Propranolol Other (See Comments)    Reaction:  Unknown   . Tavist [  Clemastine] Other (See Comments)    Reaction:  Unknown   . Tramadol Other (See Comments)    Reaction:  Unknown   . Vicodin [Hydrocodone-Acetaminophen] Other (See Comments)    Reaction:  Unknown     DISCHARGE MEDICATIONS:   Current Discharge Medication List    START taking these medications   Details  furosemide (LASIX) 20 MG tablet Take 1 tablet (20 mg total) by mouth daily. Qty: 30 tablet, Refills: 1     predniSONE (DELTASONE) 10 MG tablet Label  & dispense according to the schedule below. 5 Pills PO for 1 day then, 4 Pills PO for 1 day, 3 Pills PO for 1 day, 2 Pills PO for 1 day, 1 Pill PO for 1 days then STOP. Qty: 15 tablet, Refills: 0      CONTINUE these medications which have CHANGED   Details  levofloxacin (LEVAQUIN) 500 MG tablet Take 1 tablet (500 mg total) by mouth daily. Qty: 5 tablet, Refills: 0      CONTINUE these medications which have NOT CHANGED   Details  albuterol (PROVENTIL HFA;VENTOLIN HFA) 108 (90 BASE) MCG/ACT inhaler Inhale 1-2 puffs into the lungs every 4 (four) hours as needed for wheezing or shortness of breath.    amLODipine (NORVASC) 10 MG tablet Take 10 mg by mouth daily.    aspirin EC 81 MG tablet Take 81 mg by mouth every evening.     cholecalciferol (VITAMIN D) 1000 UNITS tablet Take 1,000 Units by mouth daily.    cloNIDine (CATAPRES) 0.1 MG tablet Take 0.1 mg by mouth 3 (three) times daily.    clopidogrel (PLAVIX) 75 MG tablet Take 75 mg by mouth daily.    diltiazem (CARDIZEM CD) 120 MG 24 hr capsule Take 1 capsule (120 mg total) by mouth daily. Qty: 30 capsule, Refills: 0    docusate sodium (COLACE) 100 MG capsule Take 100 mg by mouth 2 (two) times daily.     feeding supplement, ENSURE ENLIVE, (ENSURE ENLIVE) LIQD Take 237 mLs by mouth daily.     ferrous sulfate 325 (65 FE) MG tablet Take 325 mg by mouth 2 (two) times daily with a meal.     fluticasone (FLONASE) 50 MCG/ACT nasal spray Place 2 sprays into both nostrils every morning.     fluticasone (FLOVENT HFA) 110 MCG/ACT inhaler Inhale 2 puffs into the lungs 2 (two) times daily.    levothyroxine (SYNTHROID, LEVOTHROID) 100 MCG tablet Take 100 mcg by mouth daily before breakfast.    LORazepam (ATIVAN) 0.5 MG tablet Take 0.5 mg by mouth 3 (three) times daily.     losartan (COZAAR) 50 MG tablet Take 50 mg by mouth daily.    Multiple Vitamin (MULTIVITAMIN WITH MINERALS) TABS tablet Take 1  tablet by mouth daily.    Oxybutynin Chloride 10 % GEL Place 1 application onto the skin at bedtime.    polyethylene glycol (MIRALAX / GLYCOLAX) packet Take 17 g by mouth daily. *Mix in 4-8 ounces of fluid prior to taking*    ranitidine (ZANTAC) 150 MG tablet Take 150 mg by mouth 2 (two) times daily before a meal.     sodium bicarbonate 325 MG tablet Take 325 mg by mouth 4 (four) times daily.    tiotropium (SPIRIVA) 18 MCG inhalation capsule Place 18 mcg into inhaler and inhale daily.    vitamin B-12 (CYANOCOBALAMIN) 1000 MCG tablet Take 1,000 mcg by mouth daily.    benzonatate (TESSALON) 100 MG capsule Take 100 mg by mouth 3 (three)  times daily as needed for cough.      STOP taking these medications     azithromycin (ZITHROMAX) 250 MG tablet      predniSONE (STERAPRED UNI-PAK 21 TAB) 10 MG (21) TBPK tablet          DISCHARGE INSTRUCTIONS:   DIET:  Cardiac diet  DISCHARGE CONDITION:  Stable  ACTIVITY:  Activity as tolerated  OXYGEN:  Home Oxygen: Yes.     Oxygen Delivery: 2 liters/min via Patient connected to nasal cannula oxygen  DISCHARGE LOCATION:  Home with Home Health nursing, PT   If you experience worsening of your admission symptoms, develop shortness of breath, life threatening emergency, suicidal or homicidal thoughts you must seek medical attention immediately by calling 911 or calling your MD immediately  if symptoms less severe.  You Must read complete instructions/literature along with all the possible adverse reactions/side effects for all the Medicines you take and that have been prescribed to you. Take any new Medicines after you have completely understood and accpet all the possible adverse reactions/side effects.   Please note  You were cared for by a hospitalist during your hospital stay. If you have any questions about your discharge medications or the care you received while you were in the hospital after you are discharged, you can call the  unit and asked to speak with the hospitalist on call if the hospitalist that took care of you is not available. Once you are discharged, your primary care physician will handle any further medical issues. Please note that NO REFILLS for any discharge medications will be authorized once you are discharged, as it is imperative that you return to your primary care physician (or establish a relationship with a primary care physician if you do not have one) for your aftercare needs so that they can reassess your need for medications and monitor your lab values.     Today   Shortness of breath improved.  + cough but non-productive.  Sodium up to 126 today.   VITAL SIGNS:  Blood pressure 134/60, pulse 59, temperature 98 F (36.7 C), temperature source Oral, resp. rate 14, height  (1.448 m), weight 51.483 kg (113 lb 8 oz), SpO2 83 %.  I/O:   Intake/Output Summary (Last 24 hours) at 01/30/16 1220 Last data filed at 01/30/16 1115  Gross per 24 hour  Intake    770 ml  Output   1700 ml  Net   -930 ml    PHYSICAL EXAMINATION:   GENERAL: 82 y.o.-year-old patient lying in the bed in no acute distress.  EYES: Pupils equal, round, reactive to light and accommodation. No scleral icterus. Extraocular muscles intact.  HEENT: Head atraumatic, normocephalic. Oropharynx and nasopharynx clear.  NECK: Supple, no jugular venous distention. No thyroid enlargement, no tenderness.  LUNGS: Prolonged Insp. & exp. phase, no wheezing, rales, rhonchi. No use of accessory muscles of respiration.  CARDIOVASCULAR: S1, S2 normal. No murmurs, rubs, or gallops.  ABDOMEN: Soft, nontender, nondistended. Bowel sounds present. No organomegaly or mass.  EXTREMITIES: No cyanosis, clubbing or edema b/l.  NEUROLOGIC: Cranial nerves II through XII are intact. No focal Motor or sensory deficits b/l.  PSYCHIATRIC: The patient is alert and oriented x 3.  SKIN: No obvious rash, lesion, or ulcer.   DATA REVIEW:    CBC  Recent Labs Lab 01/30/16 0628  WBC 17.5*  HGB 12.3  HCT 35.6  PLT 403    Chemistries   Recent Labs Lab 01/30/16 0628  NA  126*  K 4.3  CL 82*  CO2 35*  GLUCOSE 92  BUN 23*  CREATININE 0.52  CALCIUM 8.4*    Cardiac Enzymes  Recent Labs Lab 01/26/16 1820  TROPONINI <0.03    Microbiology Results  Results for orders placed or performed during the hospital encounter of 01/26/16  Culture, blood (routine x 2)     Status: None (Preliminary result)   Collection Time: 01/26/16  6:45 PM  Result Value Ref Range Status   Specimen Description BLOOD LEFT ARM  Final   Special Requests BOTTLES DRAWN AEROBIC AND ANAEROBIC  3CC  Final   Culture NO GROWTH 4 DAYS  Final   Report Status PENDING  Incomplete  Culture, blood (routine x 2)     Status: None (Preliminary result)   Collection Time: 01/26/16  7:50 PM  Result Value Ref Range Status   Specimen Description BLOOD RIGHT ARM  Final   Special Requests   Final    BOTTLES DRAWN AEROBIC AND ANAEROBIC 25CCAERO,23CCANA   Culture NO GROWTH 4 DAYS  Final   Report Status PENDING  Incomplete    RADIOLOGY:  No results found.    Management plans discussed with the patient, family and they are in agreement.  CODE STATUS:     Code Status Orders        Start     Ordered   01/26/16 2157  Do not attempt resuscitation (DNR)   Continuous    Question Answer Comment  In the event of cardiac or respiratory ARREST Do not call a "code blue"   In the event of cardiac or respiratory ARREST Do not perform Intubation, CPR, defibrillation or ACLS   In the event of cardiac or respiratory ARREST Use medication by any route, position, wound care, and other measures to relive pain and suffering. May use oxygen, suction and manual treatment of airway obstruction as needed for comfort.      01/26/16 2156    Code Status History    Date Active Date Inactive Code Status Order ID Comments User Context   12/11/2015  9:29 PM 12/15/2015  8:13  PM DNR [id number]  Ramonita LabAruna Gouru, MD Inpatient   09/25/2015  4:17 PM 09/26/2015 10:07 PM DNR [id number]  Altamese DillingVaibhavkumar Vachhani, MD ED      TOTAL TIME TAKING CARE OF THIS PATIENT: 40 minutes.    Houston SirenSAINANI,[place] Borchardt J M.D on 01/30/2016 at 12:20 PM  Between 7am to 6pm - Pager - [phone]  After 6pm go to [email or web address] - password [email or web address] [facility]  [facility]  Office  [phone]  CC: Primary care physician; Rafael BihariWALKER III, JOHN B, MD

## 2016-01-30 NOTE — Progress Notes (Addendum)
Room Air at rest 83%. 2L at rest 98%.

## 2016-01-30 NOTE — Care Management (Signed)
Chose [facility] for oxygen needs in the home. Will update [facility] for Home Health needs. Daughter will transport. Discharge to home today per Dr. Nonie HoyerSaniani. Gwenette GreetBrenda S Camika Marsico RN MSN CCM Care Management [phone]

## 2016-01-30 NOTE — Progress Notes (Signed)
Patient discharged home per MD order. All discharge instructions given and all questions answered. Prescriptions given to patient. 

## 2016-01-31 LAB — CULTURE, BLOOD (ROUTINE X 2)
CULTURE: NO GROWTH
Culture: NO GROWTH

## 2016-02-18 ENCOUNTER — Inpatient Hospital Stay
Admission: EM | Admit: 2016-02-18 | Discharge: 2016-02-25 | DRG: 291 | Disposition: A | Discharge status: Skilled Nursing Facility | Payer: Medicare Other | Attending: Specialist | Admitting: Specialist

## 2016-02-18 ENCOUNTER — Emergency Department: Payer: Medicare Other

## 2016-02-18 DIAGNOSIS — I5033 Acute on chronic diastolic (congestive) heart failure: Secondary | ICD-10-CM | POA: Diagnosis present

## 2016-02-18 DIAGNOSIS — Z806 Family history of leukemia: Secondary | ICD-10-CM

## 2016-02-18 DIAGNOSIS — R0602 Shortness of breath: Secondary | ICD-10-CM

## 2016-02-18 DIAGNOSIS — J9811 Atelectasis: Secondary | ICD-10-CM | POA: Diagnosis present

## 2016-02-18 DIAGNOSIS — Z90710 Acquired absence of both cervix and uterus: Secondary | ICD-10-CM

## 2016-02-18 DIAGNOSIS — Z9109 Other allergy status, other than to drugs and biological substances: Secondary | ICD-10-CM

## 2016-02-18 DIAGNOSIS — R1310 Dysphagia, unspecified: Secondary | ICD-10-CM | POA: Diagnosis present

## 2016-02-18 DIAGNOSIS — R32 Unspecified urinary incontinence: Secondary | ICD-10-CM | POA: Diagnosis present

## 2016-02-18 DIAGNOSIS — Z7951 Long term (current) use of inhaled steroids: Secondary | ICD-10-CM | POA: Diagnosis not present

## 2016-02-18 DIAGNOSIS — J811 Chronic pulmonary edema: Secondary | ICD-10-CM | POA: Diagnosis present

## 2016-02-18 DIAGNOSIS — R0600 Dyspnea, unspecified: Secondary | ICD-10-CM | POA: Diagnosis not present

## 2016-02-18 DIAGNOSIS — R0902 Hypoxemia: Secondary | ICD-10-CM

## 2016-02-18 DIAGNOSIS — E039 Hypothyroidism, unspecified: Secondary | ICD-10-CM | POA: Diagnosis present

## 2016-02-18 DIAGNOSIS — I110 Hypertensive heart disease with heart failure: Secondary | ICD-10-CM | POA: Diagnosis present

## 2016-02-18 DIAGNOSIS — J9621 Acute and chronic respiratory failure with hypoxia: Secondary | ICD-10-CM | POA: Diagnosis present

## 2016-02-18 DIAGNOSIS — R0603 Acute respiratory distress: Secondary | ICD-10-CM

## 2016-02-18 DIAGNOSIS — M1990 Unspecified osteoarthritis, unspecified site: Secondary | ICD-10-CM | POA: Diagnosis present

## 2016-02-18 DIAGNOSIS — N390 Urinary tract infection, site not specified: Secondary | ICD-10-CM | POA: Diagnosis present

## 2016-02-18 DIAGNOSIS — Z886 Allergy status to analgesic agent status: Secondary | ICD-10-CM

## 2016-02-18 DIAGNOSIS — R609 Edema, unspecified: Secondary | ICD-10-CM

## 2016-02-18 DIAGNOSIS — Z8673 Personal history of transient ischemic attack (TIA), and cerebral infarction without residual deficits: Secondary | ICD-10-CM

## 2016-02-18 DIAGNOSIS — K219 Gastro-esophageal reflux disease without esophagitis: Secondary | ICD-10-CM | POA: Diagnosis present

## 2016-02-18 DIAGNOSIS — Z8249 Family history of ischemic heart disease and other diseases of the circulatory system: Secondary | ICD-10-CM | POA: Diagnosis not present

## 2016-02-18 DIAGNOSIS — R109 Unspecified abdominal pain: Secondary | ICD-10-CM

## 2016-02-18 DIAGNOSIS — M810 Age-related osteoporosis without current pathological fracture: Secondary | ICD-10-CM | POA: Diagnosis present

## 2016-02-18 DIAGNOSIS — J432 Centrilobular emphysema: Secondary | ICD-10-CM

## 2016-02-18 DIAGNOSIS — Z66 Do not resuscitate: Secondary | ICD-10-CM | POA: Diagnosis present

## 2016-02-18 DIAGNOSIS — L8990 Pressure ulcer of unspecified site, unspecified stage: Secondary | ICD-10-CM | POA: Diagnosis present

## 2016-02-18 DIAGNOSIS — J9819 Other pulmonary collapse: Secondary | ICD-10-CM | POA: Diagnosis present

## 2016-02-18 DIAGNOSIS — J441 Chronic obstructive pulmonary disease with (acute) exacerbation: Secondary | ICD-10-CM | POA: Diagnosis present

## 2016-02-18 DIAGNOSIS — E871 Hypo-osmolality and hyponatremia: Secondary | ICD-10-CM

## 2016-02-18 DIAGNOSIS — I509 Heart failure, unspecified: Secondary | ICD-10-CM

## 2016-02-18 DIAGNOSIS — K5900 Constipation, unspecified: Secondary | ICD-10-CM | POA: Diagnosis present

## 2016-02-18 DIAGNOSIS — Z9889 Other specified postprocedural states: Secondary | ICD-10-CM

## 2016-02-18 DIAGNOSIS — J90 Pleural effusion, not elsewhere classified: Secondary | ICD-10-CM

## 2016-02-18 DIAGNOSIS — J810 Acute pulmonary edema: Secondary | ICD-10-CM | POA: Diagnosis not present

## 2016-02-18 DIAGNOSIS — E222 Syndrome of inappropriate secretion of antidiuretic hormone: Secondary | ICD-10-CM | POA: Diagnosis present

## 2016-02-18 LAB — COMPREHENSIVE METABOLIC PANEL
ALT: 15 U/L (ref 14–54)
ANION GAP: 10 (ref 5–15)
AST: 20 U/L (ref 15–41)
Albumin: 3.5 g/dL (ref 3.5–5.0)
Alkaline Phosphatase: 116 U/L (ref 38–126)
BILIRUBIN TOTAL: 0.6 mg/dL (ref 0.3–1.2)
BUN: 6 mg/dL (ref 6–20)
CALCIUM: 8.7 mg/dL — AB (ref 8.9–10.3)
CO2: 36 mmol/L — ABNORMAL HIGH (ref 22–32)
Chloride: 70 mmol/L — ABNORMAL LOW (ref 101–111)
Creatinine, Ser: 0.34 mg/dL — ABNORMAL LOW (ref 0.44–1.00)
GFR calc Af Amer: >60 mL/min (ref >60)
Glucose, Bld: 166 mg/dL — ABNORMAL HIGH (ref 65–99)
POTASSIUM: 4.2 mmol/L (ref 3.5–5.1)
Sodium: 116 mmol/L — CL (ref 135–145)
TOTAL PROTEIN: 7.3 g/dL (ref 6.5–8.1)

## 2016-02-18 LAB — CBC WITH DIFFERENTIAL/PLATELET
Basophils Absolute: 0.1 10*3/uL (ref 0–0.1)
Basophils Relative: 1 %
Eosinophils Absolute: 0.2 10*3/uL (ref 0–0.7)
Eosinophils Relative: 1 %
HEMATOCRIT: 29.9 % — AB (ref 35.0–47.0)
Hemoglobin: 10.6 g/dL — ABNORMAL LOW (ref 12.0–16.0)
LYMPHS ABS: 0.4 10*3/uL — AB (ref 1.0–3.6)
LYMPHS PCT: 3 %
MCH: 29.8 pg (ref 26.0–34.0)
MCHC: 35.6 g/dL (ref 32.0–36.0)
MCV: 83.5 fL (ref 80.0–100.0)
MONO ABS: 0.9 10*3/uL (ref 0.2–0.9)
MONOS PCT: 7 %
NEUTROS ABS: 11.5 10*3/uL — AB (ref 1.4–6.5)
Neutrophils Relative %: 88 %
Platelets: 394 10*3/uL (ref 150–440)
RBC: 3.57 MIL/uL — ABNORMAL LOW (ref 3.80–5.20)
RDW: 14.8 % — AB (ref 11.5–14.5)
WBC: 13.1 10*3/uL — ABNORMAL HIGH (ref 3.6–11.0)

## 2016-02-18 LAB — TROPONIN I

## 2016-02-18 MED ORDER — SODIUM BICARBONATE 650 MG PO TABS
325.0000 mg | ORAL_TABLET | Freq: Four times a day (QID) | ORAL | Status: DC
  Administered 2016-02-19 – 2016-02-25 (×26): 325 mg via ORAL
  Filled 2016-02-18 (×4): qty 1
  Filled 2016-02-18: qty 0.5
  Filled 2016-02-18 (×4): qty 1
  Filled 2016-02-18: qty 2
  Filled 2016-02-18 (×17): qty 1

## 2016-02-18 MED ORDER — TIOTROPIUM BROMIDE MONOHYDRATE 18 MCG IN CAPS
18.0000 ug | ORAL_CAPSULE | Freq: Every day | RESPIRATORY_TRACT | Status: DC
Start: 2016-02-19 — End: 2016-02-25
  Administered 2016-02-19 – 2016-02-25 (×7): 18 ug via RESPIRATORY_TRACT
  Filled 2016-02-18 (×2): qty 5

## 2016-02-18 MED ORDER — FLUTICASONE PROPIONATE 50 MCG/ACT NA SUSP
2.0000 | NASAL | Status: DC
  Administered 2016-02-19 – 2016-02-25 (×8): 2 via NASAL
  Filled 2016-02-18: qty 16

## 2016-02-18 MED ORDER — DOCUSATE SODIUM 100 MG PO CAPS
100.0000 mg | ORAL_CAPSULE | Freq: Two times a day (BID) | ORAL | Status: DC
  Administered 2016-02-19 (×2): 100 mg via ORAL
  Filled 2016-02-18 (×3): qty 1

## 2016-02-18 MED ORDER — LORAZEPAM 0.5 MG PO TABS
ORAL_TABLET | ORAL | Status: AC
  Administered 2016-02-18: 0.5 mg via ORAL
  Filled 2016-02-18: qty 1

## 2016-02-18 MED ORDER — SODIUM CHLORIDE 0.9 % IV SOLN
Freq: Once | INTRAVENOUS | Status: AC
  Administered 2016-02-18: 21:00:00 via INTRAVENOUS

## 2016-02-18 MED ORDER — METHYLPREDNISOLONE SODIUM SUCC 125 MG IJ SOLR
125.0000 mg | Freq: Once | INTRAMUSCULAR | Status: AC
  Administered 2016-02-18: 125 mg via INTRAVENOUS
  Filled 2016-02-18: qty 2

## 2016-02-18 MED ORDER — METHYLPREDNISOLONE SODIUM SUCC 125 MG IJ SOLR
60.0000 mg | Freq: Four times a day (QID) | INTRAMUSCULAR | Status: DC
  Administered 2016-02-19 – 2016-02-21 (×11): 60 mg via INTRAVENOUS
  Filled 2016-02-18 (×12): qty 2

## 2016-02-18 MED ORDER — IPRATROPIUM-ALBUTEROL 0.5-2.5 (3) MG/3ML IN SOLN
3.0000 mL | Freq: Once | RESPIRATORY_TRACT | Status: AC
  Administered 2016-02-18: 3 mL via RESPIRATORY_TRACT
  Filled 2016-02-18: qty 3

## 2016-02-18 MED ORDER — SODIUM CHLORIDE 0.9% FLUSH
3.0000 mL | Freq: Two times a day (BID) | INTRAVENOUS | Status: DC
  Administered 2016-02-19 – 2016-02-25 (×12): 3 mL via INTRAVENOUS

## 2016-02-18 MED ORDER — LOSARTAN POTASSIUM 50 MG PO TABS
50.0000 mg | ORAL_TABLET | Freq: Every day | ORAL | Status: DC
  Administered 2016-02-19: 50 mg via ORAL
  Filled 2016-02-18: qty 1

## 2016-02-18 MED ORDER — FLUTICASONE PROPIONATE HFA 110 MCG/ACT IN AERO: 2.0000 | INHALATION_SPRAY | Freq: Two times a day (BID) | RESPIRATORY_TRACT | Status: DC

## 2016-02-18 MED ORDER — DILTIAZEM HCL ER COATED BEADS 120 MG PO CP24
120.0000 mg | ORAL_CAPSULE | Freq: Every day | ORAL | Status: DC
  Administered 2016-02-19 – 2016-02-25 (×7): 120 mg via ORAL
  Filled 2016-02-18 (×7): qty 1

## 2016-02-18 MED ORDER — CLONIDINE HCL 0.1 MG PO TABS
ORAL_TABLET | ORAL | Status: AC
  Administered 2016-02-18: 0.1 mg
  Filled 2016-02-18: qty 1

## 2016-02-18 MED ORDER — POLYETHYLENE GLYCOL 3350 17 G PO PACK
17.0000 g | PACK | Freq: Every day | ORAL | Status: DC
  Administered 2016-02-19 – 2016-02-24 (×5): 17 g via ORAL
  Filled 2016-02-18 (×6): qty 1

## 2016-02-18 MED ORDER — CLONIDINE HCL 0.1 MG PO TABS
0.1000 mg | ORAL_TABLET | Freq: Three times a day (TID) | ORAL | Status: DC
  Administered 2016-02-19 – 2016-02-25 (×20): 0.1 mg via ORAL
  Filled 2016-02-18 (×20): qty 1

## 2016-02-18 MED ORDER — VITAMIN B-12 1000 MCG PO TABS
1000.0000 ug | ORAL_TABLET | Freq: Every day | ORAL | Status: DC
  Administered 2016-02-19 – 2016-02-25 (×7): 1000 ug via ORAL
  Filled 2016-02-18 (×7): qty 1

## 2016-02-18 MED ORDER — LORAZEPAM 0.5 MG PO TABS
0.5000 mg | ORAL_TABLET | Freq: Three times a day (TID) | ORAL | Status: DC
  Administered 2016-02-19 – 2016-02-25 (×19): 0.5 mg via ORAL
  Filled 2016-02-18 (×20): qty 1

## 2016-02-18 MED ORDER — CLOPIDOGREL BISULFATE 75 MG PO TABS
75.0000 mg | ORAL_TABLET | Freq: Every day | ORAL | Status: DC
  Administered 2016-02-19 – 2016-02-25 (×7): 75 mg via ORAL
  Filled 2016-02-18 (×7): qty 1

## 2016-02-18 MED ORDER — LEVOTHYROXINE SODIUM 100 MCG PO TABS
100.0000 ug | ORAL_TABLET | Freq: Every day | ORAL | Status: DC
  Administered 2016-02-19 – 2016-02-25 (×7): 100 ug via ORAL
  Filled 2016-02-18 (×7): qty 1

## 2016-02-18 MED ORDER — AMLODIPINE BESYLATE 10 MG PO TABS
10.0000 mg | ORAL_TABLET | Freq: Every day | ORAL | Status: DC
  Administered 2016-02-19: 10 mg via ORAL
  Filled 2016-02-18: qty 1

## 2016-02-18 MED ORDER — VITAMIN D 1000 UNITS PO TABS
1000.0000 [IU] | ORAL_TABLET | Freq: Every day | ORAL | Status: DC
  Administered 2016-02-19 – 2016-02-25 (×7): 1000 [IU] via ORAL
  Filled 2016-02-18 (×7): qty 1

## 2016-02-18 MED ORDER — ENOXAPARIN SODIUM 40 MG/0.4ML ~~LOC~~ SOLN: 40.0000 mg | SUBCUTANEOUS | Status: DC

## 2016-02-18 MED ORDER — ENSURE ENLIVE PO LIQD
237.0000 mL | Freq: Every day | ORAL | Status: DC
  Administered 2016-02-19 – 2016-02-23 (×4): 237 mL via ORAL

## 2016-02-18 MED ORDER — ASPIRIN EC 81 MG PO TBEC
81.0000 mg | DELAYED_RELEASE_TABLET | Freq: Every evening | ORAL | Status: DC
  Administered 2016-02-19 – 2016-02-24 (×6): 81 mg via ORAL
  Filled 2016-02-18 (×6): qty 1

## 2016-02-18 MED ORDER — BUDESONIDE 0.25 MG/2ML IN SUSP
0.2500 mg | Freq: Two times a day (BID) | RESPIRATORY_TRACT | Status: DC
  Administered 2016-02-19 – 2016-02-25 (×14): 0.25 mg via RESPIRATORY_TRACT
  Filled 2016-02-18 (×14): qty 2

## 2016-02-18 MED ORDER — LEVOFLOXACIN 500 MG PO TABS: 500.0000 mg | ORAL_TABLET | Freq: Every day | ORAL | Status: DC

## 2016-02-18 MED ORDER — FERROUS SULFATE 325 (65 FE) MG PO TABS
325.0000 mg | ORAL_TABLET | Freq: Two times a day (BID) | ORAL | Status: DC
  Administered 2016-02-19 – 2016-02-25 (×13): 325 mg via ORAL
  Filled 2016-02-18 (×14): qty 1

## 2016-02-18 MED ORDER — ONDANSETRON HCL 4 MG/2ML IJ SOLN: 4.0000 mg | Freq: Four times a day (QID) | INTRAMUSCULAR | Status: DC | PRN

## 2016-02-18 MED ORDER — FAMOTIDINE 20 MG PO TABS
20.0000 mg | ORAL_TABLET | Freq: Two times a day (BID) | ORAL | Status: DC
  Administered 2016-02-19: 20 mg via ORAL
  Filled 2016-02-18: qty 1

## 2016-02-18 MED ORDER — ONDANSETRON HCL 4 MG PO TABS: 4.0000 mg | ORAL_TABLET | Freq: Four times a day (QID) | ORAL | Status: DC | PRN

## 2016-02-18 MED ORDER — ADULT MULTIVITAMIN W/MINERALS CH
1.0000 | ORAL_TABLET | Freq: Every day | ORAL | Status: DC
  Administered 2016-02-19 – 2016-02-25 (×7): 1 via ORAL
  Filled 2016-02-18 (×7): qty 1

## 2016-02-18 MED ORDER — SODIUM CHLORIDE 0.9 % IV SOLN
INTRAVENOUS | Status: DC
Start: 2016-02-18 — End: 2016-02-19
  Administered 2016-02-19: 06:00:00 via INTRAVENOUS

## 2016-02-18 MED ORDER — OXYBUTYNIN CHLORIDE 10 % TD GEL: 1.0000 "application " | Freq: Every day | TRANSDERMAL | Status: DC

## 2016-02-18 MED ORDER — LORAZEPAM 0.5 MG PO TABS
0.5000 mg | ORAL_TABLET | Freq: Once | ORAL | Status: AC
  Administered 2016-02-18: 0.5 mg via ORAL

## 2016-02-18 MED ORDER — IPRATROPIUM-ALBUTEROL 0.5-2.5 (3) MG/3ML IN SOLN
3.0000 mL | RESPIRATORY_TRACT | Status: DC | PRN
  Administered 2016-02-19 – 2016-02-21 (×6): 3 mL via RESPIRATORY_TRACT
  Filled 2016-02-18 (×6): qty 3

## 2016-02-18 NOTE — ED Provider Notes (Signed)
[facility] Emergency Department Provider Note  Time seen: 7:34 PM  I have reviewed the triage vital signs and the nursing notes.   HISTORY  Chief Complaint Shortness of Breath    HPI Alyssa Landry is a 82 y.o. female with a past medical history of atrial fibrillation, COPD, CHF, who presents the emergency department for shortness of breath starting this morning. According to the patient since this morning she has felt considerable shortness of breath. Denies cough, congestion, fever, sputum production. Denies chest pain. Patient was recently started on 2 L oxygen via nasal cannula 01/30/16.  Past Medical History:  Diagnosis Date  . A-fib (HCC)   . Acid reflux   . Anemia   . Anxiety   . Arthritis   . Asthma   . Chronic hyponatremia   . COPD (chronic obstructive pulmonary disease) (HCC)   . GERD (gastroesophageal reflux disease)   . Hiatal hernia   . Hiatal hernia   . Hyperlipidemia   . Hypertension   . Hypothyroidism   . Incontinence   . Migraine   . Osteoporosis, post-menopausal   . Persistent cough   . Pulmonary fibrosis (HCC)   . SIADH (syndrome of inappropriate ADH production) (HCC)   . TIA (transient ischemic attack)     Patient Active Problem List   Diagnosis Date Noted  . Acute on chronic respiratory failure with hypoxia (HCC) 01/26/2016  . Atrial fibrillation with RVR (HCC) 12/11/2015  . Urge incontinence 09/26/2015  . Lichenified rash 09/26/2015  . Syncopal episodes 09/25/2015    Past Surgical History:  Procedure Laterality Date  . ABDOMINAL HYSTERECTOMY  1966    Prior to Admission medications   Medication Sig Start Date End Date Taking? Authorizing Provider  albuterol (PROVENTIL HFA;VENTOLIN HFA) 108 (90 BASE) MCG/ACT inhaler Inhale 1-2 puffs into the lungs every 4 (four) hours as needed for wheezing or shortness of breath.    Historical Provider, MD  amLODipine (NORVASC) 10 MG tablet Take 10 mg by mouth daily.    Historical  Provider, MD  aspirin EC 81 MG tablet Take 81 mg by mouth every evening.     Historical Provider, MD  benzonatate (TESSALON) 100 MG capsule Take 100 mg by mouth 3 (three) times daily as needed for cough.    Historical Provider, MD  cholecalciferol (VITAMIN D) 1000 UNITS tablet Take 1,000 Units by mouth daily.    Historical Provider, MD  cloNIDine (CATAPRES) 0.1 MG tablet Take 0.1 mg by mouth 3 (three) times daily.    Historical Provider, MD  clopidogrel (PLAVIX) 75 MG tablet Take 75 mg by mouth daily.    Historical Provider, MD  diltiazem (CARDIZEM CD) 120 MG 24 hr capsule Take 1 capsule (120 mg total) by mouth daily. 12/15/15   Wyatt Hasteavid K Hower, MD  docusate sodium (COLACE) 100 MG capsule Take 100 mg by mouth 2 (two) times daily.     Historical Provider, MD  feeding supplement, ENSURE ENLIVE, (ENSURE ENLIVE) LIQD Take 237 mLs by mouth daily.     Historical Provider, MD  ferrous sulfate 325 (65 FE) MG tablet Take 325 mg by mouth 2 (two) times daily with a meal.     Historical Provider, MD  fluticasone (FLONASE) 50 MCG/ACT nasal spray Place 2 sprays into both nostrils every morning.     Historical Provider, MD  fluticasone (FLOVENT HFA) 110 MCG/ACT inhaler Inhale 2 puffs into the lungs 2 (two) times daily.    Historical Provider, MD  furosemide (LASIX)  20 MG tablet Take 1 tablet (20 mg total) by mouth daily. 01/30/16   Houston Siren, MD  levofloxacin (LEVAQUIN) 500 MG tablet Take 1 tablet (500 mg total) by mouth daily. 01/30/16   Houston Siren, MD  levothyroxine (SYNTHROID, LEVOTHROID) 100 MCG tablet Take 100 mcg by mouth daily before breakfast.    Historical Provider, MD  LORazepam (ATIVAN) 0.5 MG tablet Take 0.5 mg by mouth 3 (three) times daily.     Historical Provider, MD  losartan (COZAAR) 50 MG tablet Take 50 mg by mouth daily.    Historical Provider, MD  Multiple Vitamin (MULTIVITAMIN WITH MINERALS) TABS tablet Take 1 tablet by mouth daily.    Historical Provider, MD  Oxybutynin Chloride 10 %  GEL Place 1 application onto the skin at bedtime.    Historical Provider, MD  polyethylene glycol (MIRALAX / GLYCOLAX) packet Take 17 g by mouth daily. *Mix in 4-8 ounces of fluid prior to taking*    Historical Provider, MD  predniSONE (DELTASONE) 10 MG tablet Label  & dispense according to the schedule below. 5 Pills PO for 1 day then, 4 Pills PO for 1 day, 3 Pills PO for 1 day, 2 Pills PO for 1 day, 1 Pill PO for 1 days then STOP. 01/30/16   Houston Siren, MD  ranitidine (ZANTAC) 150 MG tablet Take 150 mg by mouth 2 (two) times daily before a meal.     Historical Provider, MD  sodium bicarbonate 325 MG tablet Take 325 mg by mouth 4 (four) times daily.    Historical Provider, MD  tiotropium (SPIRIVA) 18 MCG inhalation capsule Place 18 mcg into inhaler and inhale daily.    Historical Provider, MD  vitamin B-12 (CYANOCOBALAMIN) 1000 MCG tablet Take 1,000 mcg by mouth daily.    Historical Provider, MD    Allergies  Allergen Reactions  . Codeine Other (See Comments)    Reaction:  Unknown   . Ditropan [Oxybutynin] Other (See Comments)    Reaction:  Unknown   . Hctz [Hydrochlorothiazide] Other (See Comments)    Reaction:  Causes pts sodium/potassium levels to drop significantly   . Metronidazole Other (See Comments)    Reaction:  Unknown   . Propranolol Other (See Comments)    Reaction:  Unknown   . Tavist [Clemastine] Other (See Comments)    Reaction:  Unknown   . Tramadol Other (See Comments)    Reaction:  Unknown   . Vicodin [Hydrocodone-Acetaminophen] Other (See Comments)    Reaction:  Unknown     Family History  Problem Relation Age of Onset  . Leukemia Mother   . Heart disease Father   . Hypertension      Social History Social History  Substance Use Topics  . Smoking status: Never Smoker  . Smokeless tobacco: Not on file  . Alcohol use No    Review of Systems Constitutional: Negative for fever. Cardiovascular: Negative for chest pain. Respiratory: Moderate shortness  of breath Gastrointestinal: Negative for abdominal pain, vomiting and diarrhea. Genitourinary: Negative for dysuria. Musculoskeletal: Negative for back pain Neurological: Negative for headaches, focal weakness or numbness. 10-point ROS otherwise negative.  ____________________________________________   PHYSICAL EXAM:  VITAL SIGNS: ED Triage Vitals  Enc Vitals Group     BP 02/18/16 1930 (!) 182/79     Pulse Rate 02/18/16 1930 76     Resp 02/18/16 1930 (!) 24     Temp 02/18/16 1926 97.8 F (36.6 C)     Temp src --  SpO2 02/18/16 1930 98 %     Weight --      Height --      Head Circumference --      Peak Flow --      Pain Score 02/18/16 1919 0     Pain Loc --      Pain Edu? --      Excl. in GC? --     Constitutional: Alert and oriented. Well appearing and in no distress. Eyes: Normal exam ENT   Head: Normocephalic and atraumatic   Mouth/Throat: Mucous membranes are moist. Cardiovascular: Normal rate, regular rhythm. No murmur Respiratory: Patient has decreased air movement bilaterally with slight expiratory wheezes bilaterally. No rales or rhonchi. Gastrointestinal: Soft and nontender. No distention.   Musculoskeletal: Nontender with normal range of motion in all extremities. Minimal lower extremity edema noted. No tenderness. Neurologic:  Normal speech and language. No gross focal neurologic deficits Skin:  Skin is warm, dry and intact.  Psychiatric: Mood and affect are normal. Speech and behavior are normal.   ____________________________________________    EKG  EKG reviewed and interpreted by myself shows atrial fibrillation at 82 bpm, narrow QRS, appears to have a normal axis, nonspecific ST changes no ST elevation noted.  ____________________________________________    RADIOLOGY  Mild pulmonary edema.  ____________________________________________   INITIAL IMPRESSION / ASSESSMENT AND PLAN / ED COURSE  Pertinent labs & imaging results that  were available during my care of the patient were reviewed by me and considered in my medical decision making (see chart for details).  Patient presents the emergency department with shortness of breath since this morning. We'll check labs including cardiac enzymes, chest x-ray as well as an EKG. Patient no distress currently, does appear to have expiratory wheezes with decreased air movement bilaterally, highly suspect COPD exacerbation.  Patient's chest x-ray consistent with mild pulmonary edema. Patient's labs show significant hyponatremia of 116. Patient's baseline appears to be in the low 120s. However given the patient's pulmonary edema with hyponatremia believe the patient requires admission to the hospital for IV hydration and possible diuresis.  ____________________________________________   FINAL CLINICAL IMPRESSION(S) / ED DIAGNOSES   Dyspnea Hyponatremia Pulmonary edema   Minna Antis, MD 02/18/16 2034

## 2016-02-18 NOTE — ED Triage Notes (Signed)
Pt from home via EMS. Reports worsening SOB, was recently changed to 3 lasix, reports 8lb weight gain in past week. abd also distended and tender. Pt 77% on RA upon arrival, reports use of oxygen at home. Pt  Given breathing tx by EMS< pt wheezing

## 2016-02-19 ENCOUNTER — Inpatient Hospital Stay: Payer: Medicare Other

## 2016-02-19 DIAGNOSIS — L8990 Pressure ulcer of unspecified site, unspecified stage: Secondary | ICD-10-CM | POA: Insufficient documentation

## 2016-02-19 LAB — COMPREHENSIVE METABOLIC PANEL
ALBUMIN: 3.4 g/dL — AB (ref 3.5–5.0)
ALK PHOS: 109 U/L (ref 38–126)
ALT: 16 U/L (ref 14–54)
AST: 19 U/L (ref 15–41)
Anion gap: 10 (ref 5–15)
BILIRUBIN TOTAL: 0.5 mg/dL (ref 0.3–1.2)
BUN: 5 mg/dL — AB (ref 6–20)
CALCIUM: 8.4 mg/dL — AB (ref 8.9–10.3)
CO2: 36 mmol/L — ABNORMAL HIGH (ref 22–32)
Chloride: 70 mmol/L — ABNORMAL LOW (ref 101–111)
Creatinine, Ser: 0.42 mg/dL — ABNORMAL LOW (ref 0.44–1.00)
GFR calc Af Amer: >60 mL/min (ref >60)
GFR calc non Af Amer: >60 mL/min (ref >60)
GLUCOSE: 213 mg/dL — AB (ref 65–99)
Potassium: 3.5 mmol/L (ref 3.5–5.1)
Sodium: 116 mmol/L — CL (ref 135–145)
TOTAL PROTEIN: 7.1 g/dL (ref 6.5–8.1)

## 2016-02-19 LAB — CBC
HEMATOCRIT: 30.2 % — AB (ref 35.0–47.0)
Hemoglobin: 10.9 g/dL — ABNORMAL LOW (ref 12.0–16.0)
MCH: 30.1 pg (ref 26.0–34.0)
MCHC: 36 g/dL (ref 32.0–36.0)
MCV: 83.6 fL (ref 80.0–100.0)
Platelets: 346 10*3/uL (ref 150–440)
RBC: 3.61 MIL/uL — ABNORMAL LOW (ref 3.80–5.20)
RDW: 14.3 % (ref 11.5–14.5)
WBC: 11 10*3/uL (ref 3.6–11.0)

## 2016-02-19 LAB — SODIUM: Sodium: 116 mmol/L — CL (ref 135–145)

## 2016-02-19 LAB — TROPONIN I: Troponin I: <0.03 ng/mL (ref <0.03)

## 2016-02-19 MED ORDER — VANCOMYCIN HCL 500 MG IV SOLR
500.0000 mg | INTRAVENOUS | Status: DC
  Administered 2016-02-20: 500 mg via INTRAVENOUS
  Filled 2016-02-19 (×2): qty 500

## 2016-02-19 MED ORDER — POLYETHYLENE GLYCOL 3350 17 G PO PACK
17.0000 g | PACK | Freq: Every day | ORAL | Status: DC
  Administered 2016-02-19: 17 g via ORAL

## 2016-02-19 MED ORDER — SENNOSIDES-DOCUSATE SODIUM 8.6-50 MG PO TABS
1.0000 | ORAL_TABLET | Freq: Two times a day (BID) | ORAL | Status: DC
  Administered 2016-02-19 – 2016-02-21 (×6): 1 via ORAL
  Filled 2016-02-19 (×5): qty 1

## 2016-02-19 MED ORDER — OXYBUTYNIN CHLORIDE ER 5 MG PO TB24
5.0000 mg | ORAL_TABLET | Freq: Every day | ORAL | Status: DC
  Administered 2016-02-19 – 2016-02-24 (×6): 5 mg via ORAL
  Filled 2016-02-19 (×8): qty 1

## 2016-02-19 MED ORDER — GUAIFENESIN-DM 100-10 MG/5ML PO SYRP: 10.0000 mL | ORAL_SOLUTION | ORAL | Status: DC | PRN

## 2016-02-19 MED ORDER — NITROFURANTOIN MONOHYD MACRO 100 MG PO CAPS: 100.0000 mg | ORAL_CAPSULE | Freq: Two times a day (BID) | ORAL | Status: DC

## 2016-02-19 MED ORDER — FAMOTIDINE 20 MG PO TABS
20.0000 mg | ORAL_TABLET | Freq: Every day | ORAL | Status: DC
  Administered 2016-02-20 – 2016-02-25 (×6): 20 mg via ORAL
  Filled 2016-02-19 (×6): qty 1

## 2016-02-19 MED ORDER — LEVOFLOXACIN 500 MG PO TABS
250.0000 mg | ORAL_TABLET | Freq: Every day | ORAL | Status: DC
  Administered 2016-02-20 – 2016-02-25 (×6): 250 mg via ORAL
  Filled 2016-02-19 (×6): qty 1

## 2016-02-19 MED ORDER — ENOXAPARIN SODIUM 30 MG/0.3ML ~~LOC~~ SOLN
30.0000 mg | SUBCUTANEOUS | Status: DC
  Administered 2016-02-19 – 2016-02-20 (×2): 30 mg via SUBCUTANEOUS
  Filled 2016-02-19 (×3): qty 0.3

## 2016-02-19 MED ORDER — FUROSEMIDE 10 MG/ML IJ SOLN
20.0000 mg | Freq: Once | INTRAMUSCULAR | Status: AC
  Administered 2016-02-19: 20 mg via INTRAVENOUS
  Filled 2016-02-19: qty 2

## 2016-02-19 MED ORDER — DILTIAZEM HCL 30 MG PO TABS
30.0000 mg | ORAL_TABLET | Freq: Once | ORAL | Status: DC
  Filled 2016-02-19 (×2): qty 1

## 2016-02-19 MED ORDER — LEVOFLOXACIN 500 MG PO TABS
500.0000 mg | ORAL_TABLET | Freq: Once | ORAL | Status: AC
  Administered 2016-02-19: 500 mg via ORAL
  Filled 2016-02-19: qty 1

## 2016-02-19 MED ORDER — GUAIFENESIN ER 600 MG PO TB12
600.0000 mg | ORAL_TABLET | Freq: Two times a day (BID) | ORAL | Status: DC
  Administered 2016-02-19 – 2016-02-25 (×13): 600 mg via ORAL
  Filled 2016-02-19 (×13): qty 1

## 2016-02-19 MED ORDER — MORPHINE SULFATE (PF) 2 MG/ML IV SOLN
1.0000 mg | Freq: Four times a day (QID) | INTRAVENOUS | Status: DC | PRN
  Administered 2016-02-19 (×2): 1 mg via INTRAVENOUS
  Filled 2016-02-19 (×2): qty 1

## 2016-02-19 MED ORDER — POTASSIUM CHLORIDE 20 MEQ PO PACK
20.0000 meq | PACK | Freq: Once | ORAL | Status: AC
  Administered 2016-02-19: 20 meq via ORAL
  Filled 2016-02-19: qty 1

## 2016-02-19 MED ORDER — VANCOMYCIN HCL IN DEXTROSE 1-5 GM/200ML-% IV SOLN
1000.0000 mg | INTRAVENOUS | Status: AC
  Administered 2016-02-19: 1000 mg via INTRAVENOUS
  Filled 2016-02-19: qty 200

## 2016-02-19 NOTE — Progress Notes (Addendum)
[facility] [place] [facility]   PATIENT NAME: Alyssa Landry    MR#:  [id number]  DATE OF BIRTH:  June 23, 1919  SUBJECTIVE:  CHIEF COMPLAINT:   Chief Complaint  Patient presents with  . Shortness of Breath  Patient is a 82 year old Caucasian female with past medical history significant for history of atrial fibrillation, gastroesophageal reflux disease, COPD, hyperlipidemia, hypertension, pulmonary fibrosis, SIADH, who presents to the hospital with complaints of shortness of breath which started in the morning of admission, she denied any cough, fevers or sputum production. On arrival to the hospital. Patient's chest x-ray revealed increased a basal opacities and pleural effusions, most consistent with increased edema. Patient complains of severe shortness of breath, wheezing, cough. Sodium level was found to be low at 116.  Review of Systems  Unable to perform ROS: Medical condition    VITAL SIGNS: Blood pressure 122/61, pulse 65, temperature (!) 96.7 F (35.9 C), temperature source Axillary, resp. rate 18, weight 54 kg (119 lb), SpO2 93 %.  PHYSICAL EXAMINATION:   GENERAL:  82 y.o.-year-old patient lying in the bed in moderate to severe respiratory distress, tachypneic,  uncomfortable.  EYES: Pupils equal, round, reactive to light and accommodation. No scleral icterus. Extraocular muscles intact.  HEENT: Head atraumatic, normocephalic. Oropharynx and nasopharynx clear.  NECK:  Supple, no jugular venous distention. No thyroid enlargement, no tenderness.  LUNGS: Diminished breath sounds bilaterally, scattered wheezing, mostly on the right side, markedly diminished breath sounds otherwise throughout lung fields, no rales,rhonchi or crepitation. Using accessory muscles of respiration.  CARDIOVASCULAR: S1, S2 normal. No murmurs, rubs, or gallops.  ABDOMEN: Soft, nontender, nondistended. Bowel sounds present. No organomegaly or mass.  EXTREMITIES: 1-2+ lower  extremity and pedal edema, no cyanosis, or clubbing.  NEUROLOGIC: Cranial nerves II through XII are intact. Muscle strength 5/5 in all extremities. Sensation intact. Gait not checked.  PSYCHIATRIC: The patient is some somnolent, unable to assess orientation, minimal verbal interaction.  SKIN: No obvious rash, lesion, or ulcer.   ORDERS/RESULTS REVIEWED:   CBC  Recent Labs Lab 02/18/16 1946 02/19/16 0253  WBC 13.1* 11.0  HGB 10.6* 10.9*  HCT 29.9* 30.2*  PLT 394 346  MCV 83.5 83.6  MCH 29.8 30.1  MCHC 35.6 36.0  RDW 14.8* 14.3  LYMPHSABS 0.4*  --   MONOABS 0.9  --   EOSABS 0.2  --   BASOSABS 0.1  --    ------------------------------------------------------------------------------------------------------------------  Chemistries   Recent Labs Lab 02/18/16 1946 02/19/16 0253 02/19/16 0830  NA 116* 116* 116*  K 4.2 3.5  --   CL 70* 70*  --   CO2 36* 36*  --   GLUCOSE 166* 213*  --   BUN 6 5*  --   CREATININE 0.34* 0.42*  --   CALCIUM 8.7* 8.4*  --   AST 20 19  --   ALT 15 16  --   ALKPHOS 116 109  --   BILITOT 0.6 0.5  --    ------------------------------------------------------------------------------------------------------------------ estimated creatinine clearance is 29.8 mL/min (by C-G formula based on SCr of 0.8 mg/dL). ------------------------------------------------------------------------------------------------------------------ No results for input(s): TSH, T4TOTAL, T3FREE, THYROIDAB in the last 72 hours.  Invalid input(s): FREET3  Cardiac Enzymes  Recent Labs Lab 02/19/16 0253 02/19/16 0830 02/19/16 1348  TROPONINI <0.03 <0.03 <0.03   ------------------------------------------------------------------------------------------------------------------ Invalid input(s): POCBNP ---------------------------------------------------------------------------------------------------------------  RADIOLOGY: Dg Abd 1 View  Result Date: 02/19/2016 CLINICAL  DATA:  Initial evaluation for acute abdominal pain. EXAM: ABDOMEN - 1 VIEW  COMPARISON:  Prior CT from 08/04/2011. FINDINGS: Prominent gaseous distension of the stomach. Otherwise, no significant distention of the bowels is identified. No evidence for obstruction or ileus. Large amount of retained stool present within the colon. No abnormal bowel wall thickening. No free air on this limited supine view. No soft tissue mass or abnormal calcification. Opacification of the visualized left lung base, suspicious for effusion. A probable small right pleural effusion noted as well. Lungs are otherwise not well evaluated. Scoliosis with multilevel degenerative spondylolysis noted. Osteopenia. No acute osseous abnormality. IMPRESSION: 1. Prominent gaseous distension of the stomach. Otherwise no radiographic evidence for acute bowel obstruction. 2. Large amount of retained stool within the colon, suggesting constipation. 3. Bilateral pleural effusions, left larger than right. Electronically Signed   By: Rise Mu M.D.   On: 02/19/2016 05:04   US Venous Img Lower Bilateral  Result Date: 02/19/2016 CLINICAL DATA:  Bilateral lower extremity swelling. Shortness breath for 1 day. EXAM: BILATERAL LOWER EXTREMITY VENOUS DOPPLER ULTRASOUND TECHNIQUE: Gray-scale sonography with graded compression, as well as color Doppler and duplex ultrasound were performed to evaluate the lower extremity deep venous systems from the level of the common femoral vein and including the common femoral, femoral, profunda femoral, popliteal and calf veins including the posterior tibial, peroneal and gastrocnemius veins when visible. The superficial great saphenous vein was also interrogated. Spectral Doppler was utilized to evaluate flow at rest and with distal augmentation maneuvers in the common femoral, femoral and popliteal veins. COMPARISON:  None. FINDINGS: RIGHT LOWER EXTREMITY Normal compressibility, augmentation and color Doppler  flow in the right common femoral vein, right femoral vein and right popliteal vein. The right saphenofemoral junction is patent. Visualized right deep calf veins are patent without thrombus. LEFT LOWER EXTREMITY Normal compressibility, augmentation and color Doppler flow in the left common femoral vein, left femoral vein and left popliteal vein. The left saphenofemoral junction is patent. Left profunda femoral vein is patent without thrombus. Limited evaluation of the left calf veins. Other Findings:  Subcutaneous edema in both calves. IMPRESSION: No evidence of deep venous thrombosis in the lower extremities. Electronically Signed   By: Richarda Overlie M.D.   On: 02/19/2016 16:29   Dg Chest Portable 1 View  Result Date: 02/18/2016 CLINICAL DATA:  Pt from home via EMS. Reports worsening SOB, was recently changed to 3 Lasix, reports 8lb weight gain in past week. abd also distended and tender. Pt 77% on RA upon arrival, reports use of oxygen at home. Pt Given breathing tx by EMS< pt wheezing EXAM: PORTABLE CHEST 1 VIEW COMPARISON:  01/26/2016 FINDINGS: The heart is enlarged. There are bilateral pleural effusions. Bibasilar opacities obscure the hemidiaphragms. Interstitial edema is present. Opacity is increased since the most recent exam. IMPRESSION: Increased bibasilar opacities and pleural effusions, most consistent with increased edema. Superimposed infectious process could have a similar appearance. Electronically Signed   By: Norva Pavlov M.D.   On: 02/18/2016 19:51    EKG:  Orders placed or performed during the hospital encounter of 01/26/16  . ED EKG  . ED EKG  . ED EKG  . ED EKG    ASSESSMENT AND PLAN:  Active Problems:   Pulmonary edema   Pressure ulcer #1. Acute respiratory failure with hypoxia due to acute pulmonary edema due to acute diastolic congestive heart failure, initiate patient on Lasix intravenously, follow ins and outs, continue oxygen therapy. Echocardiogram done in July 2017  revealed normal ejection fraction, mild valvular regurgitation, elevated pulmonary arterial pressures #  2. Hyponatremia, SIADH and fluid overload, continue patient on Lasix, initiate fluid restriction, first follow sodium level in the morning #3. Leukocytosis, likely stress related, resolved #4. Questionable underlying pneumonia, continue levofloxacin, nebulizing therapy, follow clinically #5. Dysphagia, speech therapy consultation will be requested, done great diet to dysphagia 1 Management plans discussed with the patient, family and they are in agreement.   DRUG ALLERGIES:  Allergies  Allergen Reactions  . Codeine Other (See Comments)    Reaction:  Unknown   . Ditropan [Oxybutynin] Other (See Comments)    Reaction:  Unknown   . Hctz [Hydrochlorothiazide] Other (See Comments)    Reaction:  Causes pts sodium/potassium levels to drop significantly   . Metronidazole Other (See Comments)    Reaction:  Unknown   . Propranolol Other (See Comments)    Reaction:  Unknown   . Tavist [Clemastine] Other (See Comments)    Reaction:  Unknown   . Tramadol Other (See Comments)    Reaction:  Unknown   . Vicodin [Hydrocodone-Acetaminophen] Other (See Comments)    Reaction:  Unknown     CODE STATUS:     Code Status Orders        Start     Ordered   02/18/16 2311  Do not attempt resuscitation (DNR)  Continuous    Question Answer Comment  In the event of cardiac or respiratory ARREST Do not call a "code blue"   In the event of cardiac or respiratory ARREST Do not perform Intubation, CPR, defibrillation or ACLS   In the event of cardiac or respiratory ARREST Use medication by any route, position, wound care, and other measures to relive pain and suffering. May use oxygen, suction and manual treatment of airway obstruction as needed for comfort.      02/18/16 2310    Code Status History    Date Active Date Inactive Code Status Order ID Comments User Context   01/26/2016  9:56 PM 01/30/2016   7:34 PM DNR [id number]  Houston Siren, MD Inpatient   12/11/2015  9:29 PM 12/15/2015  8:13 PM DNR [id number]  Ramonita Lab, MD Inpatient   09/25/2015  4:17 PM 09/26/2015 10:07 PM DNR [id number]  Altamese Dilling, MD ED    Advance Directive Documentation   Flowsheet Row Most Recent Value  Type of Advance Directive  Healthcare Power of Attorney  Pre-existing out of facility DNR order (yellow form or pink MOST form)  No data  "MOST" Form in Place?  No data      TOTAL Critical care TIME TAKING CARE OF THIS PATIENT: 40 minutes.    Katharina Caper M.D on 02/19/2016 at 4:55 PM  Between 7am to 6pm - Pager - [phone]  After 6pm go to [email or web address] - password [email or web address] [facility]  [place] [facility]  Office  [phone]  CC: Primary care physician; Rafael Bihari, MD

## 2016-02-19 NOTE — H&P (Signed)
  EPIC DOWN DURING ADMISSION - H&P PRINTED AND ON CHART -Aleesa Sweigert, DO 

## 2016-02-19 NOTE — Progress Notes (Addendum)
Pt. Resp, labored  on admin to unit, Duo neb and BR TX admin, O2 at 2L with SATS in the 90, PT. BP also elevated in the 190's with HR in the 120's -140's.  MD notified and catapres 0.1 mg admin., BP down to 150'/70's. Cardizem 30 mg previously ordered for HR  held as rate down in the 70's. Pt. Slept for some time thereafter. Pt with abd pain this AM,, ABD distended and hard.  MD notified and I mg Morphine ordered and admin. ABD x ray also done. Results showing large amounts of retained stool in the colon. MD notified.

## 2016-02-19 NOTE — Progress Notes (Signed)
Pharmacy Antibiotic Note  Alyssa Landry is a 82 y.o. female admitted on 02/18/2016 with meningitis.  Pharmacy has been consulted for vancomycin dosing. Per Care Everywhere, pt with MRSA and enterococcus UTI sensitive to vancomycin from 8/3.   Plan: Vancomycin 1 g IV x1 then 500 mg IV q24h.  Will need to follow renal function closely in this elderly female. Will order SCr for AM.  Vanc trough 8/12 at 1100  Ke 0.028, half life 24 h, Vd 37.8 L  Weight: 119 lb (54 kg)  Temp (24hrs), Avg:97.5 F (36.4 C), Min:96.7 F (35.9 C), Max:97.8 F (36.6 C)   Recent Labs Lab 02/18/16 1946 02/19/16 0253  WBC 13.1* 11.0  CREATININE 0.34* 0.42*    Estimated Creatinine Clearance: 29.8 mL/min (by C-G formula based on SCr of 0.8 mg/dL).    Allergies  Allergen Reactions  . Codeine Other (See Comments)    Reaction:  Unknown   . Ditropan [Oxybutynin] Other (See Comments)    Reaction:  Unknown   . Hctz [Hydrochlorothiazide] Other (See Comments)    Reaction:  Causes pts sodium/potassium levels to drop significantly   . Metronidazole Other (See Comments)    Reaction:  Unknown   . Propranolol Other (See Comments)    Reaction:  Unknown   . Tavist [Clemastine] Other (See Comments)    Reaction:  Unknown   . Tramadol Other (See Comments)    Reaction:  Unknown   . Vicodin [Hydrocodone-Acetaminophen] Other (See Comments)    Reaction:  Unknown     Antimicrobials this admission: vanc 8/9 >>  Dose adjustments this admission:   Microbiology results: UCx sent  Thank you for allowing pharmacy to be a part of this patient's care.  Marty HeckWang, Keshon Markovitz L 02/19/2016 1:05 PM

## 2016-02-19 NOTE — Plan of Care (Signed)
Patient continues to struggle with breathing discomfort and abdominal pain. Dr. Truitt MerleNotified via request of respiratory.  Patient to be given soap suds enema to help relieve distention of abdomen and hopefully assist with discomfort.  Patient and family educated on current POC.

## 2016-02-19 NOTE — Evaluation (Signed)
Clinical/Bedside Swallow Evaluation Patient Details  Name: Allen Derrynnie C Capek MRN: [id number] Date of Birth: 1918/11/23  Today's Date: 02/19/2016 Time: SLP Start Time (ACUTE ONLY): 1410 SLP Stop Time (ACUTE ONLY): 1510 SLP Time Calculation (min) (ACUTE ONLY): 60 min  Past Medical History:  Past Medical History:  Diagnosis Date  . A-fib (HCC)   . Acid reflux   . Anemia   . Anxiety   . Arthritis   . Asthma   . Chronic hyponatremia   . COPD (chronic obstructive pulmonary disease) (HCC)   . GERD (gastroesophageal reflux disease)   . Hiatal hernia   . Hiatal hernia   . Hyperlipidemia   . Hypertension   . Hypothyroidism   . Incontinence   . Migraine   . Osteoporosis, post-menopausal   . Persistent cough   . Pulmonary fibrosis (HCC)   . SIADH (syndrome of inappropriate ADH production) (HCC)   . TIA (transient ischemic attack)    Past Surgical History:  Past Surgical History:  Procedure Laterality Date  . ABDOMINAL HYSTERECTOMY  1966   HPI:  Pt is a 82 y.o. female with a past medical history of atrial fibrillation, COPD, CHF, who presents the emergency department for shortness of breath starting this morning. According to the patient since this morning she has felt considerable shortness of breath. Denies cough, congestion, fever, sputum production. Denies chest pain. Patient was recently started on 2 L oxygen via nasal cannula 01/30/16. Pt is having increased respiratory effort since not feeling well w/ constipation per Dtr. Pt recevied tx for the constipation this morning. Pt also has a dxs of Reflux and hiatal hernia w/ multiple other medical issues. When taking pills w/ puree this morning, pt attempted to use sips of water to "wash it down" then began coughing per report. Pt does use a puree to swallow pills w/ at baseline per Dtr. Of note, a recent CT(2017) stated Patulous thoracic esophagus with fluid level, suggesting Esophageal dysmotility or Reflux.    Assessment / Plan /  Recommendation Clinical Impression  Pt appeared to tolerate trials of thin liquids via SINGLE SIPS FROM CUP and 1/2 tsp boluses of Puree w/ no immediate, overt s/s of aspiration noted; no wet vocal quality or coughing/throat clearing followed trials. Pt's declined respiratory status significantly increased demands on her breathing during any exertion and impacting her airway closure timing during swallowing - noted immediate leaning back and raising head back for long inhalation after each trial. Frequent Rest Breaks were given during the po trials to avoid further increased WOB/SOB and the chance for aspiration after the swallow. Pt appeared fatigued after the few trials. Thorough education w/ Daughter present, and pt, on strict aspiration precautions and monitoring of pt's respiratory effort during any oral intake. Encouraged ALL medications to be crushed in a Puree, or in a liquid form, for easier swallowing at this time. ST will f/u w/ toleration of diet and ongoing education w/ pt/family.    Aspiration Risk  Mild aspiration risk (d/t respiratory status)    Diet Recommendation  Dysphagia 3 w/ added purees in diet; thin liquids VIA CUP and NO STRAWS; strict Aspiration precautions w/ strict monitoring of Respiratory status during all oral intake.  Medication Administration: Crushed with puree as able; liquid form   Other  Recommendations Recommended Consults:  (dietician f/u) Oral Care Recommendations: Oral care BID;Staff/trained caregiver to provide oral care   Follow up Recommendations   (TBD)    Frequency and Duration min 3x week  2 weeks  Prognosis Prognosis for Safe Diet Advancement: Fair Barriers to Reach Goals: Severity of deficits (medical/respiratory status)      Swallow Study   General Date of Onset: 02/18/16 HPI: Pt is a 82 y.o. female with a past medical history of atrial fibrillation, COPD, CHF, who presents the emergency department for shortness of breath starting  this morning. According to the patient since this morning she has felt considerable shortness of breath. Denies cough, congestion, fever, sputum production. Denies chest pain. Patient was recently started on 2 L oxygen via nasal cannula 01/30/16. Pt is having increased respiratory effort since not feeling well w/ constipation per Dtr. Pt recevied tx for the constipation this morning. Pt also has a dxs of Reflux and hiatal hernia w/ multiple other medical issues. When taking pills w/ puree this morning, pt attempted to use sips of water to "wash it down" then began coughing per report. Pt does use a puree to swallow pills w/ at baseline per Dtr. Of note, a recent CT(2017) stated Patulous thoracic esophagus with fluid level, suggesting Esophageal dysmotility or Reflux.  Type of Study: Bedside Swallow Evaluation Previous Swallow Assessment: none indicated Diet Prior to this Study: Dysphagia 3 (soft);Thin liquids (described by Dtr) Temperature Spikes Noted: No (wbc 11.0) Respiratory Status: Nasal cannula (2-4 liters) History of Recent Intubation: No Behavior/Cognition: Alert;Cooperative;Pleasant mood;Requires cueing (min) Oral Cavity Assessment: Dry Oral Care Completed by SLP: Recent completion by staff Oral Cavity - Dentition: Adequate natural dentition Vision: Functional for self-feeding Self-Feeding Abilities: Able to feed self;Needs assist;Needs set up Patient Positioning: Upright in bed (pt likes to recline back for "easier breathing") Baseline Vocal Quality: Low vocal intensity Volitional Cough: Strong Volitional Swallow: Able to elicit    Oral/Motor/Sensory Function Overall Oral Motor/Sensory Function: Within functional limits   Ice Chips Ice chips: Within functional limits Presentation: Spoon (fed; 2 trials)   Thin Liquid Thin Liquid: Within functional limits Presentation: Cup;Self Fed (9 trials) Other Comments: the swallow was significantly impacted by pt's respiratory status/demand     Nectar Thick Nectar Thick Liquid: Not tested   Honey Thick Honey Thick Liquid: Not tested   Puree Puree: Within functional limits Presentation: Spoon (fed; 6 trials) Other Comments: the swallow was significantly impacted by pt's respiratory status/demand   Solid   GO   Solid: Not tested Other Comments: the pt declined as she had had her lunch meal       Jerilynn Som, MS, CCC-SLP  Siennah Barrasso 02/19/2016,3:28 PM

## 2016-02-19 NOTE — Plan of Care (Signed)
Dr. Text to inform of Crital Lab: Sodium 116.

## 2016-02-20 LAB — BASIC METABOLIC PANEL
Anion gap: 9 (ref 5–15)
BUN: 12 mg/dL (ref 6–20)
CO2: 36 mmol/L — ABNORMAL HIGH (ref 22–32)
CREATININE: 0.52 mg/dL (ref 0.44–1.00)
Calcium: 9.2 mg/dL (ref 8.9–10.3)
Chloride: 72 mmol/L — ABNORMAL LOW (ref 101–111)
GFR calc Af Amer: >60 mL/min (ref >60)
GLUCOSE: 137 mg/dL — AB (ref 65–99)
Potassium: 4.6 mmol/L (ref 3.5–5.1)
SODIUM: 117 mmol/L — AB (ref 135–145)

## 2016-02-20 MED ORDER — DOCUSATE SODIUM 100 MG PO CAPS
100.0000 mg | ORAL_CAPSULE | Freq: Two times a day (BID) | ORAL | Status: DC
  Administered 2016-02-20 – 2016-02-21 (×3): 100 mg via ORAL
  Filled 2016-02-20 (×4): qty 1

## 2016-02-20 MED ORDER — POLYETHYLENE GLYCOL 3350 17 G PO PACK
17.0000 g | PACK | Freq: Every day | ORAL | Status: DC
  Administered 2016-02-21 – 2016-02-25 (×5): 17 g via ORAL
  Filled 2016-02-20 (×2): qty 1

## 2016-02-20 MED ORDER — LOSARTAN POTASSIUM 50 MG PO TABS
50.0000 mg | ORAL_TABLET | Freq: Two times a day (BID) | ORAL | Status: DC
Start: 2016-02-20 — End: 2016-02-25
  Administered 2016-02-20 – 2016-02-25 (×10): 50 mg via ORAL
  Filled 2016-02-20 (×10): qty 1

## 2016-02-20 MED ORDER — FUROSEMIDE 10 MG/ML IJ SOLN
20.0000 mg | Freq: Two times a day (BID) | INTRAMUSCULAR | Status: DC
Start: 2016-02-20 — End: 2016-02-21
  Administered 2016-02-20 (×2): 20 mg via INTRAVENOUS
  Filled 2016-02-20 (×2): qty 2

## 2016-02-20 MED ORDER — SENNOSIDES-DOCUSATE SODIUM 8.6-50 MG PO TABS
1.0000 | ORAL_TABLET | Freq: Every day | ORAL | Status: DC
  Administered 2016-02-21: 1 via ORAL
  Filled 2016-02-20 (×2): qty 1

## 2016-02-20 NOTE — Plan of Care (Signed)
Problem: SLP Dysphagia Goals Goal: Misc Dysphagia Goal Pt will safely tolerate po diet of least restrictive consistency w/ no overt s/s of aspiration noted by Staff/pt/family x3 sessions.    

## 2016-02-20 NOTE — Progress Notes (Signed)
Initial Nutrition Assessment  DOCUMENTATION CODES:   Not applicable  INTERVENTION:  -Pt likes Magic Cup; recommend sending TID on meal trays.  -Cater to pt preferences within diet restrictions, family placing pt meal orders  NUTRITION DIAGNOSIS:   Inadequate oral intake related to acute illness, poor appetite as evidenced by per patient/family report.  GOAL:   Patient will meet greater than or equal to 90% of their needs  MONITOR:   PO intake, Supplement acceptance, Labs, Weight trends, I & O's  REASON FOR ASSESSMENT:   Consult Poor PO  ASSESSMENT:    82 yo female admitted with acute respiratory failure due to pulmonary edema, acute CHF; hyponatremia, dysphagia, possible pneumonia  Family reports that pt appetite had been good up until recently; report pt drinks Ensure daily. Report no changes in wt recently Pt receiving bath at time of visit today. Unable to complete Nutrition-Focused physical exam at this time.    Past Medical History:  Diagnosis Date  . A-fib (HCC)   . Acid reflux   . Anemia   . Anxiety   . Arthritis   . Asthma   . Chronic hyponatremia   . COPD (chronic obstructive pulmonary disease) (HCC)   . GERD (gastroesophageal reflux disease)   . Hiatal hernia   . Hiatal hernia   . Hyperlipidemia   . Hypertension   . Hypothyroidism   . Incontinence   . Migraine   . Osteoporosis, post-menopausal   . Persistent cough   . Pulmonary fibrosis (HCC)   . SIADH (syndrome of inappropriate ADH production) (HCC)   . TIA (transient ischemic attack)      Diet Order:  DIET - DYS 1 Room service appropriate? Yes; Fluid consistency: Nectar Thick   Energy Intake: no recorded po intake; pt did eat most of Magic Cup this AM per report.  Skin:   (stage I pressure ulcer on buttock)  Last BM:  constipated, no recent BM  Labs: sodium 117, potassium wdl  Meds: lasix, MVI, miralax, senokot, solumedrol, colace  Height:   Ht Readings from Last 1 Encounters:   01/26/16 4\' 9"  (1.448 m)    Weight:   Wt Readings from Last 1 Encounters:  02/20/16 121 lb 12.8 oz (55.2 kg)   Wt Readings from Last 10 Encounters:  02/20/16 121 lb 12.8 oz (55.2 kg)  01/26/16 113 lb 8 oz (51.5 kg)  12/11/15 111 lb 5.3 oz (50.5 kg)  09/25/15 102 lb 3.2 oz (46.4 kg)  09/24/15 106 lb 14.4 oz (48.5 kg)  06/26/15 104 lb (47.2 kg)    BMI:  Body mass index is 26.36 kg/m.  Estimated Nutritional Needs:   Kcal:  1400-1650 kcals   Protein:  66-77 g  Fluid:  >/= 1.4 L  EDUCATION NEEDS:   No education needs identified at this time  Romelle StarcherCate Ellisha Bankson MS, RD, LDN [phone] Pager  [phone] Weekend/On-Call Pager

## 2016-02-20 NOTE — Progress Notes (Signed)
[facility] [place] [facility]   PATIENT NAME: Alyssa Landry    MR#:  [id number]  DATE OF BIRTH:  10/02/1918  SUBJECTIVE:  CHIEF COMPLAINT:   Chief Complaint  Patient presents with  . Shortness of Breath  Patient is a 82 year old Caucasian female with past medical history significant for history of atrial fibrillation, gastroesophageal reflux disease, COPD, hyperlipidemia, hypertension, pulmonary fibrosis, SIADH, who presents to the hospital with complaints of shortness of breath which started in the morning of admission, she denied any cough, fevers or sputum production. On arrival to the hospital. Patient's chest x-ray revealed increased a basal opacities and pleural effusions, most consistent with increased edema. The patient was initiated on Lasix intravenously and improved with breathing, she still has some shortness of breath, but not as uncomfortable as she was yesterday, some wheezing, cough. Sodium level was found to be low at 116, improved to 117 with diuretic.Marland Kitchen  Review of Systems  Constitutional: Negative for chills, fever and weight loss.  HENT: Negative for congestion.   Eyes: Negative for blurred vision and double vision.  Respiratory: Positive for cough, shortness of breath and wheezing. Negative for sputum production.   Cardiovascular: Negative for chest pain, palpitations, orthopnea, leg swelling and PND.  Gastrointestinal: Negative for abdominal pain, blood in stool, constipation, diarrhea, nausea and vomiting.  Genitourinary: Negative for dysuria, frequency, hematuria and urgency.  Musculoskeletal: Negative for falls.  Neurological: Negative for dizziness, tremors, focal weakness and headaches.  Endo/Heme/Allergies: Does not bruise/bleed easily.  Psychiatric/Behavioral: Negative for depression. The patient does not have insomnia.     VITAL SIGNS: Blood pressure (!) 144/66, pulse 76, temperature 97.6 F (36.4 C), temperature source Oral,  resp. rate 20, weight 55.2 kg (121 lb 12.8 oz), SpO2 98 %.  PHYSICAL EXAMINATION:   GENERAL:  82 y.o.-year-old patient lying in the bed in mild respiratory distress, some tachypneic, especially on exertion and with speech, but overall less uncomfortable.  EYES: Pupils equal, round, reactive to light and accommodation. No scleral icterus. Extraocular muscles intact.  HEENT: Head atraumatic, normocephalic. Oropharynx and nasopharynx clear.  NECK:  Supple, no jugular venous distention. No thyroid enlargement, no tenderness.  LUNGS: Some better breath sounds bilaterally, scattered wheezing, mostly on the right side, markedly diminished breath sounds on the left, no rales,rhonchi or crepitation. Intermittently using accessory muscles of respiration, with exertion and speech.  CARDIOVASCULAR: S1, S2 normal. No murmurs, rubs, or gallops.  ABDOMEN: Soft, nontender, nondistended. Bowel sounds present. No organomegaly or mass.  EXTREMITIES: 1-2+ lower extremity and pedal edema, no cyanosis, or clubbing.  NEUROLOGIC: Cranial nerves II through XII are intact. Muscle strength 5/5 in all extremities. Sensation intact. Gait not checked.  PSYCHIATRIC: The patient is alert today, able to interact, answer a few questions  SKIN: No obvious rash, lesion, or ulcer.   ORDERS/RESULTS REVIEWED:   CBC  Recent Labs Lab 02/18/16 1946 02/19/16 0253  WBC 13.1* 11.0  HGB 10.6* 10.9*  HCT 29.9* 30.2*  PLT 394 346  MCV 83.5 83.6  MCH 29.8 30.1  MCHC 35.6 36.0  RDW 14.8* 14.3  LYMPHSABS 0.4*  --   MONOABS 0.9  --   EOSABS 0.2  --   BASOSABS 0.1  --    ------------------------------------------------------------------------------------------------------------------  Chemistries   Recent Labs Lab 02/18/16 1946 02/19/16 0253 02/19/16 0830 02/20/16 0457  NA 116* 116* 116* 117*  K 4.2 3.5  --  4.6  CL 70* 70*  --  72*  CO2 36* 36*  --  36*  GLUCOSE 166* 213*  --  137*  BUN 6 5*  --  12  CREATININE  0.34* 0.42*  --  0.52  CALCIUM 8.7* 8.4*  --  9.2  AST 20 19  --   --   ALT 15 16  --   --   ALKPHOS 116 109  --   --   BILITOT 0.6 0.5  --   --    ------------------------------------------------------------------------------------------------------------------ estimated creatinine clearance is 30 mL/min (by C-G formula based on SCr of 0.8 mg/dL). ------------------------------------------------------------------------------------------------------------------ No results for input(s): TSH, T4TOTAL, T3FREE, THYROIDAB in the last 72 hours.  Invalid input(s): FREET3  Cardiac Enzymes  Recent Labs Lab 02/19/16 0253 02/19/16 0830 02/19/16 1348  TROPONINI <0.03 <0.03 <0.03   ------------------------------------------------------------------------------------------------------------------ Invalid input(s): POCBNP ---------------------------------------------------------------------------------------------------------------  RADIOLOGY: Dg Abd 1 View  Result Date: 02/19/2016 CLINICAL DATA:  Initial evaluation for acute abdominal pain. EXAM: ABDOMEN - 1 VIEW COMPARISON:  Prior CT from 08/04/2011. FINDINGS: Prominent gaseous distension of the stomach. Otherwise, no significant distention of the bowels is identified. No evidence for obstruction or ileus. Large amount of retained stool present within the colon. No abnormal bowel wall thickening. No free air on this limited supine view. No soft tissue mass or abnormal calcification. Opacification of the visualized left lung base, suspicious for effusion. A probable small right pleural effusion noted as well. Lungs are otherwise not well evaluated. Scoliosis with multilevel degenerative spondylolysis noted. Osteopenia. No acute osseous abnormality. IMPRESSION: 1. Prominent gaseous distension of the stomach. Otherwise no radiographic evidence for acute bowel obstruction. 2. Large amount of retained stool within the colon, suggesting constipation. 3.  Bilateral pleural effusions, left larger than right. Electronically Signed   By: Rise MuBenjamin  McClintock M.D.   On: 02/19/2016 05:04   [place] Venous Img Lower Bilateral  Result Date: 02/19/2016 CLINICAL DATA:  Bilateral lower extremity swelling. Shortness breath for 1 day. EXAM: BILATERAL LOWER EXTREMITY VENOUS DOPPLER ULTRASOUND TECHNIQUE: Gray-scale sonography with graded compression, as well as color Doppler and duplex ultrasound were performed to evaluate the lower extremity deep venous systems from the level of the common femoral vein and including the common femoral, femoral, profunda femoral, popliteal and calf veins including the posterior tibial, peroneal and gastrocnemius veins when visible. The superficial great saphenous vein was also interrogated. Spectral Doppler was utilized to evaluate flow at rest and with distal augmentation maneuvers in the common femoral, femoral and popliteal veins. COMPARISON:  None. FINDINGS: RIGHT LOWER EXTREMITY Normal compressibility, augmentation and color Doppler flow in the right common femoral vein, right femoral vein and right popliteal vein. The right saphenofemoral junction is patent. Visualized right deep calf veins are patent without thrombus. LEFT LOWER EXTREMITY Normal compressibility, augmentation and color Doppler flow in the left common femoral vein, left femoral vein and left popliteal vein. The left saphenofemoral junction is patent. Left profunda femoral vein is patent without thrombus. Limited evaluation of the left calf veins. Other Findings:  Subcutaneous edema in both calves. IMPRESSION: No evidence of deep venous thrombosis in the lower extremities. Electronically Signed   By: Richarda OverlieAdam  Henn M.D.   On: 02/19/2016 16:29   Dg Chest Portable 1 View  Result Date: 02/18/2016 CLINICAL DATA:  Pt from home via EMS. Reports worsening SOB, was recently changed to 3 Lasix, reports 8lb weight gain in past week. abd also distended and tender. Pt 77% on RA upon arrival,  reports use of oxygen at home. Pt Given breathing tx by EMS< pt wheezing EXAM: PORTABLE CHEST 1  VIEW COMPARISON:  01/26/2016 FINDINGS: The heart is enlarged. There are bilateral pleural effusions. Bibasilar opacities obscure the hemidiaphragms. Interstitial edema is present. Opacity is increased since the most recent exam. IMPRESSION: Increased bibasilar opacities and pleural effusions, most consistent with increased edema. Superimposed infectious process could have a similar appearance. Electronically Signed   By: Norva Pavlov M.D.   On: 02/18/2016 19:51    EKG:  Orders placed or performed during the hospital encounter of 01/26/16  . ED EKG  . ED EKG  . ED EKG  . ED EKG    ASSESSMENT AND PLAN:  Active Problems:   Pulmonary edema   Pressure ulcer #1. Acute respiratory failure with hypoxia due to acute pulmonary edema due to acute diastolic congestive heart failure, Continue patient on Lasix intravenously, follow ins and outs, daily weight, continue oxygen therapy, now on 3 L of oxygen through nasal cannula. Echocardiogram done in July 2017 revealed normal ejection fraction, mild valvular regurgitation, elevated pulmonary arterial pressures. May need to get CT scan of chest to rule out aspiration since he entrance to the left is significantly diminished in comparison to the right. Recheck chest x-ray tomorrow morning. #2. Hyponatremia, SIADH and fluid overload, continue patient on Lasix, fluid restriction, improved. Sodium level today, follow sodium level in the morning #3. Leukocytosis, likely stress versus pneumonia related, resolved #4. Questionable underlying pneumonia, continue levofloxacin, nebulizing therapy, some better clinically is a #5. Dysphagia, speech therapy consultation is requested, continue dysphagia 1 diet, family is educated  Management plans discussed with the patient, family and they are in agreement.   DRUG ALLERGIES:  Allergies  Allergen Reactions  . Codeine  Other (See Comments)    Reaction:  Unknown   . Ditropan [Oxybutynin] Other (See Comments)    Reaction:  Unknown   . Hctz [Hydrochlorothiazide] Other (See Comments)    Reaction:  Causes pts sodium/potassium levels to drop significantly   . Metronidazole Other (See Comments)    Reaction:  Unknown   . Propranolol Other (See Comments)    Reaction:  Unknown   . Tavist [Clemastine] Other (See Comments)    Reaction:  Unknown   . Tramadol Other (See Comments)    Reaction:  Unknown   . Vicodin [Hydrocodone-Acetaminophen] Other (See Comments)    Reaction:  Unknown     CODE STATUS:     Code Status Orders        Start     Ordered   02/18/16 2311  Do not attempt resuscitation (DNR)  Continuous    Question Answer Comment  In the event of cardiac or respiratory ARREST Do not call a "code blue"   In the event of cardiac or respiratory ARREST Do not perform Intubation, CPR, defibrillation or ACLS   In the event of cardiac or respiratory ARREST Use medication by any route, position, wound care, and other measures to relive pain and suffering. May use oxygen, suction and manual treatment of airway obstruction as needed for comfort.      02/18/16 2310    Code Status History    Date Active Date Inactive Code Status Order ID Comments User Context   01/26/2016  9:56 PM 01/30/2016  7:34 PM DNR [id number]  Houston Siren, MD Inpatient   12/11/2015  9:29 PM 12/15/2015  8:13 PM DNR [id number]  Ramonita Lab, MD Inpatient   09/25/2015  4:17 PM 09/26/2015 10:07 PM DNR [id number]  Altamese Dilling, MD ED    Advance Directive Documentation   Flowsheet Row Most  Recent Value  Type of Advance Directive  Healthcare Power of Attorney  Pre-existing out of facility DNR order (yellow form or pink MOST form)  No data  "MOST" Form in Place?  No data      TOTAL  TIME TAKING CARE OF THIS PATIENT: 40 minutes.    Katharina Caper M.D on 02/20/2016 at 5:30 PM  Between 7am to 6pm - Pager - [phone]  After 6pm go  to [email or web address] - password [email or web address] [facility]  [facility]  Office  [phone]  CC: Primary care physician; Rafael Bihari, MD

## 2016-02-20 NOTE — Progress Notes (Addendum)
Speech Language Pathology Treatment: Dysphagia  Patient Details Name: Alyssa Landry MRN: [id number] DOB: Jan 06, 1919 Today's Date: 02/20/2016 Time: 1415-1500 SLP Time Calculation (min) (ACUTE ONLY): 45 min  Assessment / Plan / Recommendation Clinical Impression  Pt appeared to tolerate trials of thin liquids via SINGLE SIPS FROM CUP w/ no immediate, overt s/s of aspiration noted; no wet vocal quality or coughing/throat clearing followed trials. Pt's declined respiratory status (BASELINE) significantly increases demands on her breathing during the exertion of taking po's (and w/ any exertion/task) and can impact her airway closure timing during swallowing - noted immediate leaning back and raising head back for long inhalation after each trial as she does at baseline. Noted recent CXR results and pulmonary edema noted. Frequent Rest Breaks were given during the po trials to avoid further increased WOB/SOB and the chance for aspiration after the swallow. Pt appeared fatigued after the few trials. Thorough education w/ Daughter present, and pt, on strict aspiration precautions and monitoring of pt's respiratory effort during any oral intake; strategies including drinking from a mug and pillow supports. Encouraged ALL medications to be crushed in a Puree, or in a liquid form, for easier swallowing at this time. ST will f/u w/ toleration of diet and ongoing education w/ pt/family. Recommend a Pureed consistency diet at this time to reduce fatigue from mastication effort; thin liquids via CUP. Daughter agreed. Of note, suspect pt's Esophageal dysmotility as evidenced by continuous belching and c/o "fullness" as well as baseline h/o hiatal hernia could be impacting her ability to achieve Esophageal clearing in a timely manner. Any potential Reflux or regurgitation could impact respiratory status.    HPI HPI: Pt is a 82 y.o. female with a past medical history of atrial fibrillation, COPD, CHF, who presents the  emergency department for shortness of breath starting this morning. According to the patient since this morning she has felt considerable shortness of breath. Denies cough, congestion, fever, sputum production. Denies chest pain. Patient was recently started on 2 L oxygen via nasal cannula 01/30/16. Pt is having increased respiratory effort since not feeling well w/ constipation per Dtr. Pt recevied tx for the constipation this morning. Pt also has a dxs of Reflux and hiatal hernia w/ multiple other medical issues. When taking pills w/ puree this morning, pt attempted to use sips of water to "wash it down" then began coughing per report. Pt does use a puree to swallow pills w/ at baseline per Dtr. Of note, a recent CT(2017) stated Patulous thoracic esophagus with fluid level, suggesting Esophageal dysmotility or Reflux. Dtr endorsded a hiatal hernia which could be impacting Esophageal motility as well.       SLP Plan  Continue with current plan of care     Recommendations  Diet recommendations: Dysphagia 1 (puree);Thin liquid Liquids provided via: No straw;Cup Medication Administration: Crushed with puree Supervision: Patient able to self feed;Staff to assist with self feeding;Full supervision/cueing for compensatory strategies Compensations: Minimize environmental distractions;Slow rate;Small sips/bites;Multiple dry swallows after each bite/sip (rest breaks w/ any po's) Postural Changes and/or Swallow Maneuvers: Seated upright 90 degrees;Upright 30-60 min after meal (reflux precautions)             General recommendations:  (dietician f/u) Oral Care Recommendations: Oral care BID;Staff/trained caregiver to provide oral care Follow up Recommendations:  (TBD) Plan: Continue with current plan of care     GO               Jerilynn Som, MS, CCC-SLP  [employer]  02/20/2016, 4:33 PM

## 2016-02-20 NOTE — Care Management (Signed)
Patient admitted from home with Acute respiratory failure with hypoxia due to acute pulmonary edema due to acute diastolic congestive heart failure.  Patient lives at home with her daughter.  [facility]  Open with [facility] home [facility] for PT and RN.  Abby with [facility] notified of admission.  Patient has a rollator, walker, and cane in the home.  Patient has chronic O2 through [facility].  Daughter to bring portable tank at discharge.  Will need resumption of home health orders at discharge.

## 2016-02-20 NOTE — Progress Notes (Signed)
Awaiting for GI consult due to abdominal distention.

## 2016-02-20 NOTE — Care Management Important Message (Signed)
Important Message  Patient Details  Name: Alyssa Landry MRN: [id number] Date of Birth: [birth year]   Medicare Important Message Given:  Yes    Chapman FitchBOWEN, Margart Zemanek T, RN 02/20/2016, 3:12 PM

## 2016-02-21 ENCOUNTER — Inpatient Hospital Stay: Payer: Medicare Other

## 2016-02-21 DIAGNOSIS — E871 Hypo-osmolality and hyponatremia: Secondary | ICD-10-CM

## 2016-02-21 DIAGNOSIS — J810 Acute pulmonary edema: Secondary | ICD-10-CM

## 2016-02-21 DIAGNOSIS — J9811 Atelectasis: Secondary | ICD-10-CM

## 2016-02-21 DIAGNOSIS — R0602 Shortness of breath: Secondary | ICD-10-CM

## 2016-02-21 DIAGNOSIS — J90 Pleural effusion, not elsewhere classified: Secondary | ICD-10-CM

## 2016-02-21 DIAGNOSIS — J432 Centrilobular emphysema: Secondary | ICD-10-CM

## 2016-02-21 DIAGNOSIS — R0902 Hypoxemia: Secondary | ICD-10-CM

## 2016-02-21 DIAGNOSIS — R0603 Acute respiratory distress: Secondary | ICD-10-CM

## 2016-02-21 LAB — LACTATE DEHYDROGENASE, PLEURAL OR PERITONEAL FLUID: LD FL: 57 U/L — AB (ref 3–23)

## 2016-02-21 LAB — BASIC METABOLIC PANEL
Anion gap: 7 (ref 5–15)
BUN: 17 mg/dL (ref 6–20)
CALCIUM: 9 mg/dL (ref 8.9–10.3)
CO2: 40 mmol/L — AB (ref 22–32)
CREATININE: 0.54 mg/dL (ref 0.44–1.00)
Chloride: 72 mmol/L — ABNORMAL LOW (ref 101–111)
GFR calc non Af Amer: >60 mL/min (ref >60)
GLUCOSE: 137 mg/dL — AB (ref 65–99)
Potassium: 4.3 mmol/L (ref 3.5–5.1)
Sodium: 119 mmol/L — CL (ref 135–145)

## 2016-02-21 LAB — BODY FLUID CELL COUNT WITH DIFFERENTIAL
EOS FL: 0 %
Lymphs, Fluid: 61 %
MONOCYTE-MACROPHAGE-SEROUS FLUID: 6 %
NEUTROPHIL FLUID: 31 %
Other Cells, Fluid: 2 %
WBC FLUID: 172 uL

## 2016-02-21 LAB — GRAM STAIN

## 2016-02-21 LAB — GLUCOSE, SEROUS FLUID: Glucose, Fluid: 170 mg/dL

## 2016-02-21 LAB — PROTEIN, BODY FLUID: Total protein, fluid: <3 g/dL

## 2016-02-21 MED ORDER — FUROSEMIDE 10 MG/ML IJ SOLN
20.0000 mg | Freq: Every day | INTRAMUSCULAR | Status: DC
  Administered 2016-02-21 – 2016-02-25 (×5): 20 mg via INTRAVENOUS
  Filled 2016-02-21 (×5): qty 2

## 2016-02-21 MED ORDER — METHYLPREDNISOLONE SODIUM SUCC 125 MG IJ SOLR
60.0000 mg | Freq: Two times a day (BID) | INTRAMUSCULAR | Status: DC
  Administered 2016-02-21 – 2016-02-22 (×3): 60 mg via INTRAVENOUS
  Filled 2016-02-21 (×3): qty 2

## 2016-02-21 MED ORDER — IPRATROPIUM-ALBUTEROL 0.5-2.5 (3) MG/3ML IN SOLN
3.0000 mL | Freq: Three times a day (TID) | RESPIRATORY_TRACT | Status: DC
  Administered 2016-02-21 – 2016-02-22 (×4): 3 mL via RESPIRATORY_TRACT
  Filled 2016-02-21 (×4): qty 3

## 2016-02-21 NOTE — Procedures (Signed)
Successful US guided left sided thoracentesis yielding 550 cc of serous pleural fluid.   Samples sent to lab for analysis. EBL: None No immediate complications.  Katherina RightJay Jamieka Royle, MD Pager #: [phone]

## 2016-02-21 NOTE — Progress Notes (Signed)
Soap suds enema given per order.

## 2016-02-21 NOTE — Progress Notes (Signed)
[facility] [place] [facility]   PATIENT NAME: Alyssa Landry    MR#:  [id number]  DATE OF BIRTH:  1919-02-10  SUBJECTIVE:  CHIEF COMPLAINT:   Chief Complaint  Patient presents with  . Shortness of Breath  Patient is a 82 year old Caucasian female with past medical history significant for history of atrial fibrillation, gastroesophageal reflux disease, COPD, hyperlipidemia, hypertension, pulmonary fibrosis, SIADH, who presents to the hospital with complaints of shortness of breath which started in the morning of admission, she denied any cough, fevers or sputum production. On arrival to the hospital. Patient's chest x-ray revealed increased a basal opacities and pleural effusions, most consistent with increased edema. The patient was initiated on Lasix intravenously and improved with breathing, she still has some shortness of breath, but not as uncomfortable as she was yesterday, some wheezing, cough. Sodium level was found to be low at 116, improved to 117 with diuretic.She feels somewhat improved today, less wheezing and short of breath. Still nonproductive cough. Unable to go to the bathroom, despite multiple medications given earlier. Upon further investigation, it appears that her left lower lobe is collapsed, left pleural effusion has increased in size. Pulmonary consultation is requested  Review of Systems  Constitutional: Negative for chills, fever and weight loss.  HENT: Negative for congestion.   Eyes: Negative for blurred vision and double vision.  Respiratory: Positive for cough, shortness of breath and wheezing. Negative for sputum production.   Cardiovascular: Negative for chest pain, palpitations, orthopnea, leg swelling and PND.  Gastrointestinal: Negative for abdominal pain, blood in stool, constipation, diarrhea, nausea and vomiting.  Genitourinary: Negative for dysuria, frequency, hematuria and urgency.  Musculoskeletal: Negative for falls.   Neurological: Negative for dizziness, tremors, focal weakness and headaches.  Endo/Heme/Allergies: Does not bruise/bleed easily.  Psychiatric/Behavioral: Negative for depression. The patient does not have insomnia.     VITAL SIGNS: Blood pressure (!) 160/59, pulse 72, temperature 98 F (36.7 C), temperature source Oral, resp. rate 20, weight 55.6 kg (122 lb 8 oz), SpO2 99 %.  PHYSICAL EXAMINATION:   GENERAL:  82 y.o.-year-old patient lying in the bed in mild respiratory distress, some tachypneic, especially on exertion and with speech, but overall less uncomfortable.  EYES: Pupils equal, round, reactive to light and accommodation. No scleral icterus. Extraocular muscles intact.  HEENT: Head atraumatic, normocephalic. Oropharynx and nasopharynx clear.  NECK:  Supple, no jugular venous distention. No thyroid enlargement, no tenderness.  LUNGS: better breath sounds on the right, scattered wheezing, mostly on the right side, markedly diminished breath sounds on the left, dullness to percussion, scattered rhonchi , but no crepitations. Intermittently using accessory muscles of respiration, with exertion and speech.  CARDIOVASCULAR: S1, S2 normal. No murmurs, rubs, or gallops.  ABDOMEN: Soft, nontender, nondistended. Bowel sounds present. No organomegaly or mass.  EXTREMITIES: 1+ lower extremity and pedal edema, no cyanosis, or clubbing.  NEUROLOGIC: Cranial nerves II through XII are intact. Muscle strength 5/5 in all extremities. Sensation intact. Gait not checked.  PSYCHIATRIC: The patient is alert today, able to interact, answerquestions  SKIN: No obvious rash, lesion, or ulcer.   ORDERS/RESULTS REVIEWED:   CBC  Recent Labs Lab 02/18/16 1946 02/19/16 0253  WBC 13.1* 11.0  HGB 10.6* 10.9*  HCT 29.9* 30.2*  PLT 394 346  MCV 83.5 83.6  MCH 29.8 30.1  MCHC 35.6 36.0  RDW 14.8* 14.3  LYMPHSABS 0.4*  --   MONOABS 0.9  --   EOSABS 0.2  --  BASOSABS 0.1  --     ------------------------------------------------------------------------------------------------------------------  Chemistries   Recent Labs Lab 02/18/16 1946 02/19/16 0253 02/19/16 0830 02/20/16 0457 02/21/16 0507  NA 116* 116* 116* 117* 119*  K 4.2 3.5  --  4.6 4.3  CL 70* 70*  --  72* 72*  CO2 36* 36*  --  36* 40*  GLUCOSE 166* 213*  --  137* 137*  BUN 6 5*  --  12 17  CREATININE 0.34* 0.42*  --  0.52 0.54  CALCIUM 8.7* 8.4*  --  9.2 9.0  AST 20 19  --   --   --   ALT 15 16  --   --   --   ALKPHOS 116 109  --   --   --   BILITOT 0.6 0.5  --   --   --    ------------------------------------------------------------------------------------------------------------------ estimated creatinine clearance is 30.1 mL/min (by C-G formula based on SCr of 0.8 mg/dL). ------------------------------------------------------------------------------------------------------------------ No results for input(s): TSH, T4TOTAL, T3FREE, THYROIDAB in the last 72 hours.  Invalid input(s): FREET3  Cardiac Enzymes  Recent Labs Lab 02/19/16 0253 02/19/16 0830 02/19/16 1348  TROPONINI <0.03 <0.03 <0.03   ------------------------------------------------------------------------------------------------------------------ Invalid input(s): POCBNP ---------------------------------------------------------------------------------------------------------------  RADIOLOGY: Ct Chest Wo Contrast  Result Date: 02/21/2016 CLINICAL DATA:  Hypoxia.  Multiple medical problems. EXAM: CT CHEST WITHOUT CONTRAST TECHNIQUE: Multidetector CT imaging of the chest was performed following the standard protocol without IV contrast. COMPARISON:  01/27/2016 FINDINGS: Cardiovascular: Atherosclerotic calcifications in the thoracic aorta without aneurysmal dilatation. Enlargement of the right atrium. Coronary artery calcifications. Mediastinum/Nodes: Precarinal node measures 1.2 cm in the short axis on sequence 2, image 52  and minimally changed from the recent comparison examination. Additional small mediastinal lymph nodes. Limited evaluation for hilar lymphadenopathy due to the lack of intravenous contrast. No axillary lymphadenopathy. Minimal pericardial fluid. Lungs/Pleura: Again noted are bilateral pleural effusions which have increased on the left side. Left pleural effusion is small to moderate in size. The trachea and mainstem bronchi are patent. Complete collapse and consolidation in the left lower lobe. Chronic scarring at the lung apices. Again noted are scattered peripheral densities throughout both lungs, some of which were present on the previous examination. Again noted are ground-glass opacities along the anterior lungs. Some of these ground-glass opacities are new or changed from the previous examination. New focal nodular consolidation along the medial right upper lobe on sequence 3, image 70. There is also focal nodular consolidation or opacity along the medial aspect of the lingula on sequence 2, image 67. Upper Abdomen: Limited evaluation of the upper abdomen without gross abnormality. Musculoskeletal: No acute bone abnormality. IMPRESSION: Complete collapse and consolidation in the left lower lobe. Bilateral pleural effusions. Left pleural effusion has slightly enlarged since the previous CT. Again noted are scattered peripheral densities and ground-glass opacities. New the focal nodular opacities along the medial upper lobes as described. Findings are nonspecific but probably represent acute on chronic inflammatory changes or infection. Cardiomegaly. Electronically Signed   By: Richarda Overlie M.D.   On: 02/21/2016 10:15   US Venous Img Lower Bilateral  Result Date: 02/19/2016 CLINICAL DATA:  Bilateral lower extremity swelling. Shortness breath for 1 day. EXAM: BILATERAL LOWER EXTREMITY VENOUS DOPPLER ULTRASOUND TECHNIQUE: Gray-scale sonography with graded compression, as well as color Doppler and duplex  ultrasound were performed to evaluate the lower extremity deep venous systems from the level of the common femoral vein and including the common femoral, femoral, profunda femoral, popliteal and calf veins  including the posterior tibial, peroneal and gastrocnemius veins when visible. The superficial great saphenous vein was also interrogated. Spectral Doppler was utilized to evaluate flow at rest and with distal augmentation maneuvers in the common femoral, femoral and popliteal veins. COMPARISON:  None. FINDINGS: RIGHT LOWER EXTREMITY Normal compressibility, augmentation and color Doppler flow in the right common femoral vein, right femoral vein and right popliteal vein. The right saphenofemoral junction is patent. Visualized right deep calf veins are patent without thrombus. LEFT LOWER EXTREMITY Normal compressibility, augmentation and color Doppler flow in the left common femoral vein, left femoral vein and left popliteal vein. The left saphenofemoral junction is patent. Left profunda femoral vein is patent without thrombus. Limited evaluation of the left calf veins. Other Findings:  Subcutaneous edema in both calves. IMPRESSION: No evidence of deep venous thrombosis in the lower extremities. Electronically Signed   By: Richarda Overlie M.D.   On: 02/19/2016 16:29    EKG:  Orders placed or performed during the hospital encounter of 01/26/16  . ED EKG  . ED EKG  . ED EKG  . ED EKG    ASSESSMENT AND PLAN:  Active Problems:   Pulmonary edema   Pressure ulcer #1. Acute respiratory failure with hypoxia due to acute pulmonary edema due to acute diastolic congestive heart failure, Also  left lower lobe collapse, worsening left pleural effusion, questionable aspiration, Continue patient on Lasix intravenously, follow ins and outs, daily weight, continue oxygen therapy, now on 2 L of oxygen through nasal cannula. Echocardiogram done in July 2017 revealed normal ejection fraction, mild valvular regurgitation,  elevated pulmonary arterial pressures. CT scan of chest revealed a left lower lobe collapse, worsening left-sided pleural effusion, no obvious intrabronchial lesion, pulmonary consultation was requested.  #2. Hyponatremia, SIADH and fluid overload, continue patient on Lasix, fluid restriction, improved sodium level , follow sodium level in the morning #3. Leukocytosis, likely stress versus pneumonia related, resolved #4. Questionable underlying pneumonia, continue levofloxacin, nebulizing therapy, overall better clinically, no sputum cultures were obtained due to just dry cough #5. Dysphagia, speech therapy consultation was requested, continue dysphagia 1 diet, family is educated #6, constipation, questionable ileus versus pseudoobstruction, gastroesophageal consultation is requested, attempted a few enemas with no results, medications are being given. However, with again no results, repeat KUB, may need to have manual disimpaction done.  Management plans discussed with the patient, family and they are in agreement.   DRUG ALLERGIES:  Allergies  Allergen Reactions  . Codeine Other (See Comments)    Reaction:  Unknown   . Ditropan [Oxybutynin] Other (See Comments)    Reaction:  Unknown   . Hctz [Hydrochlorothiazide] Other (See Comments)    Reaction:  Causes pts sodium/potassium levels to drop significantly   . Metronidazole Other (See Comments)    Reaction:  Unknown   . Propranolol Other (See Comments)    Reaction:  Unknown   . Tavist [Clemastine] Other (See Comments)    Reaction:  Unknown   . Tramadol Other (See Comments)    Reaction:  Unknown   . Vicodin [Hydrocodone-Acetaminophen] Other (See Comments)    Reaction:  Unknown     CODE STATUS:     Code Status Orders        Start     Ordered   02/18/16 2311  Do not attempt resuscitation (DNR)  Continuous    Question Answer Comment  In the event of cardiac or respiratory ARREST Do not call a "code blue"   In the event of  cardiac  or respiratory ARREST Do not perform Intubation, CPR, defibrillation or ACLS   In the event of cardiac or respiratory ARREST Use medication by any route, position, wound care, and other measures to relive pain and suffering. May use oxygen, suction and manual treatment of airway obstruction as needed for comfort.      02/18/16 2310    Code Status History    Date Active Date Inactive Code Status Order ID Comments User Context   01/26/2016  9:56 PM 01/30/2016  7:34 PM DNR [id number]  Houston Siren, MD Inpatient   12/11/2015  9:29 PM 12/15/2015  8:13 PM DNR [id number]  Ramonita Lab, MD Inpatient   09/25/2015  4:17 PM 09/26/2015 10:07 PM DNR [id number]  Altamese Dilling, MD ED    Advance Directive Documentation   Flowsheet Row Most Recent Value  Type of Advance Directive  Healthcare Power of Attorney  Pre-existing out of facility DNR order (yellow form or pink MOST form)  No data  "MOST" Form in Place?  No data      TOTAL  TIME TAKING CARE OF THIS PATIENT: 40 minutes.   Discussed with pulmonologist   Addam Goeller M.D on 02/21/2016 at 11:59 AM  Between 7am to 6pm - Pager - [phone]  After 6pm go to [email or web address] - password [email or web address] [facility]  [facility]  Office  [phone]  CC: Primary care physician; Rafael Bihari, MD

## 2016-02-21 NOTE — Progress Notes (Signed)
Speech Language Pathology Treatment: Dysphagia  Patient Details Name: Alyssa Landry MRN: [id number] DOB: 02-15-1919 Today's Date: 02/21/2016 Time: [id number] SLP Time Calculation (min) (ACUTE ONLY): 18 min  Assessment / Plan / Recommendation Clinical Impression  Spoke with nsg prior to seeing pt. Reportedly pt had just finished taking pills and was fatigued by effort. ST limited interaction with pt during this treatment session d/t her fatigue. Pt appeared to tolerate trials of puree by tsp and thin liquids via SINGLE SIPS FROM CUP w/ no immediate, overt s/s of aspiration noted; no wet vocal quality or coughing/throat clearing followed trials. Pt's baseline declined respiratory status significantly increases demands on her breathing during the exertion of taking po's and can impact her airway closure timing during swallowing. Pt still noted to lean back and raise head back for long inhalation after each trial as she does at baseline. Noted recent CXR results and pulmonary edema noted. Frequent Rest Breaks were given during the po trials to avoid further increased WOB/SOB and the chance for aspiration after the swallow. Pt appeared fatigued after the few trials and stated she did not want any further Po's. Thorough education w/ Daughter present, and pt, on strict aspiration precautions and monitoring of pt's respiratory effort during any oral intake; strategies including drinking from a mug and pillow supports. Encouraged ALL medications to be crushed in a Puree, or in a liquid form, for easier swallowing at this time. ST will f/u w/ toleration of diet and ongoing education w/ pt/family. Recommend to continue a Pureed consistency diet at this time to reduce fatigue from mastication effort; thin liquids via CUP. Daughter and patient agreed.   HPI HPI: Pt is a 82 y.o. female with a past medical history of atrial fibrillation, COPD, CHF, who presents the emergency department for shortness of breath starting  this morning. According to the patient since this morning she has felt considerable shortness of breath. Denies cough, congestion, fever, sputum production. Denies chest pain. Patient was recently started on 2 L oxygen via nasal cannula 01/30/16. Pt is having increased respiratory effort since not feeling well w/ constipation per Dtr. Pt recevied tx for the constipation this morning. Pt also has a dxs of Reflux and hiatal hernia w/ multiple other medical issues. When taking pills w/ puree this morning, pt attempted to use sips of water to "wash it down" then began coughing per report. Pt does use a puree to swallow pills w/ at baseline per Dtr. Of note, a recent CT(2017) stated Patulous thoracic esophagus with fluid level, suggesting Esophageal dysmotility or Reflux. Dtr endorsded a hiatal hernia which could be impacting Esophageal motility as well.       SLP Plan  Continue with current plan of care     Recommendations  Diet recommendations: Dysphagia 1 (puree);Thin liquid Liquids provided via: No straw;Cup Medication Administration: Crushed with puree Supervision: Patient able to self feed;Staff to assist with self feeding;Full supervision/cueing for compensatory strategies Compensations: Minimize environmental distractions;Slow rate;Small sips/bites;Multiple dry swallows after each bite/sip Postural Changes and/or Swallow Maneuvers: Seated upright 90 degrees;Upright 30-60 min after meal             Oral Care Recommendations: Oral care BID;Staff/trained caregiver to provide oral care Follow up Recommendations: Other (comment) (TBD) Plan: Continue with current plan of care     GO                [place],Cordarrius Coad 02/21/2016, 12:31 PM

## 2016-02-21 NOTE — Consult Note (Signed)
PULMONARY / CRITICAL CARE MEDICINE   Name: Alyssa Landry MRN: [id number] DOB: Mar 02, 1919   Referring Physician - Dr. Winona Legato CC: "short of breath" Reason for consult - pleural effusion   ADMISSION DATE:  02/18/2016  HISTORY: 82 year old female past medical history of atrial fibrillation, GERD, COPD, lipidemia, hypertension, pulmonary fibrosis, SIADH with chronic hyponatremia, presented on 89 with complaints of shortness of breath. This treated per daughter who is in the room, she is also healthcare power of attorney. Stated patient started having some shortness of breath Thursday prior to admission, saw her PMD who stated that they couldn't auscultate much breath sounds on the left side, had a urine culture done that showed positive MRSA in the urine and was sent to the hospital for further evaluation. During the hospitalization her chest x-ray revealed increase in basilar opacities and left-sided greater than right-sided pleural effusion, along with a sodium level on admission of 116. She still with cough, no fever, no sputum production. Scheduled for a thoracentesis via ultrasound today.    STUDIES:  CT chest 02/21/16-bilateral pleural effusion, left greater than right, peripheral groundglass opacity, consolidation of the left lower lobe and possible collapse  SIGNIFICANT EVENTS: 8/9  VITAL SIGNS: Temp:  [97.5 F (36.4 C)-98 F (36.7 C)] 97.9 F (36.6 C) (08/11 1306) Pulse Rate:  [72-91] 91 (08/11 1306) Resp:  [20] 20 (08/11 1306) BP: (146-160)/(59-62) 146/62 (08/11 1306) SpO2:  [98 %-99 %] 99 % (08/11 1306) Weight:  [122 lb 8 oz (55.6 kg)] 122 lb 8 oz (55.6 kg) (08/11 0445) HEMODYNAMICS:   VENTILATOR SETTINGS:   INTAKE / OUTPUT:  Intake/Output Summary (Last 24 hours) at 02/21/16 1431 Last data filed at 02/21/16 1306  Gross per 24 hour  Intake              120 ml  Output             1100 ml  Net             -980 ml    Review of Systems  Constitutional: Negative for  chills and fever.  Eyes: Negative for blurred vision and double vision.  Respiratory: Positive for cough, shortness of breath and wheezing. Negative for sputum production.   Cardiovascular: Negative for chest pain.  Gastrointestinal: Positive for constipation. Negative for blood in stool, diarrhea, heartburn and nausea.  Genitourinary: Positive for frequency. Negative for dysuria.  Musculoskeletal: Negative for myalgias.  Skin: Negative for rash.  Neurological: Negative for dizziness and headaches.  Endo/Heme/Allergies: Does not bruise/bleed easily.    Physical Exam  Nursing note and vitals reviewed.  GENERAL:  mild respiratory distress, some tachypneic, especially on exertion and with speech, but overall less uncomfortable.  EYES: Pupils equal, round, reactive to light and accommodation. No scleral icterus. Extraocular muscles intact.  HEENT: Head atraumatic, normocephalic. Oropharynx and nasopharynx clear.  NECK:  Supple, no jugular venous distention. No thyroid enlargement, no tenderness.  LUNGS: dec bs on right, absent BS on the left, tachypneic, faint wheezes on the right. Mild use of accessory muscles  CARDIOVASCULAR: S1, S2 normal. No murmurs, rubs, or gallops.  ABDOMEN: Soft, nontender, nondistended. Bowel sounds present. No organomegaly or mass.  EXTREMITIES: 1+ lower extremity and pedal edema, no cyanosis, or clubbing.  NEUROLOGIC: Cranial nerves II through XII are intact. Muscle strength 5/5 in all extremities. Sensation intact. Gait not checked.  PSYCHIATRIC: The patient is alert today, able to interact, answerquestions  SKIN: No obvious rash, lesion, or ulcer.   LABS:  CBC  Recent Labs Lab 02/18/16 1946 02/19/16 0253  WBC 13.1* 11.0  HGB 10.6* 10.9*  HCT 29.9* 30.2*  PLT 394 346   Coag's No results for input(s): APTT, INR in the last 168 hours. BMET  Recent Labs Lab 02/19/16 0253 02/19/16 0830 02/20/16 0457 02/21/16 0507  NA 116* 116* 117* 119*  K 3.5   --  4.6 4.3  CL 70*  --  72* 72*  CO2 36*  --  36* 40*  BUN 5*  --  12 17  CREATININE 0.42*  --  0.52 0.54  GLUCOSE 213*  --  137* 137*   Electrolytes  Recent Labs Lab 02/19/16 0253 02/20/16 0457 02/21/16 0507  CALCIUM 8.4* 9.2 9.0   Sepsis Markers No results for input(s): LATICACIDVEN, PROCALCITON, O2SATVEN in the last 168 hours. ABG No results for input(s): PHART, PCO2ART, PO2ART in the last 168 hours. Liver Enzymes  Recent Labs Lab 02/18/16 1946 02/19/16 0253  AST 20 19  ALT 15 16  ALKPHOS 116 109  BILITOT 0.6 0.5  ALBUMIN 3.5 3.4*   Cardiac Enzymes  Recent Labs Lab 02/19/16 0253 02/19/16 0830 02/19/16 1348  TROPONINI <0.03 <0.03 <0.03   Glucose No results for input(s): GLUCAP in the last 168 hours.  Imaging Dg Abd 1 View  Result Date: 02/21/2016 CLINICAL DATA:  Constipation. EXAM: ABDOMEN - 1 VIEW COMPARISON:  02/19/2016 FINDINGS: There is moderate stool throughout nondilated loops of colon with some improvement compared with prior study. No evidence for bowel obstruction. No free intraperitoneal air. Degenerative changes are seen in the spine. IMPRESSION: Persistent significant stool within the colon. There has been some improvement in stool burden compared to prior study. Nonobstructive bowel gas pattern. Electronically Signed   By: Norva PavlovElizabeth  Brown M.D.   On: 02/21/2016 12:34   Ct Chest Wo Contrast  Result Date: 02/21/2016 CLINICAL DATA:  Hypoxia.  Multiple medical problems. EXAM: CT CHEST WITHOUT CONTRAST TECHNIQUE: Multidetector CT imaging of the chest was performed following the standard protocol without IV contrast. COMPARISON:  01/27/2016 FINDINGS: Cardiovascular: Atherosclerotic calcifications in the thoracic aorta without aneurysmal dilatation. Enlargement of the right atrium. Coronary artery calcifications. Mediastinum/Nodes: Precarinal node measures 1.2 cm in the short axis on sequence 2, image 52 and minimally changed from the recent comparison  examination. Additional small mediastinal lymph nodes. Limited evaluation for hilar lymphadenopathy due to the lack of intravenous contrast. No axillary lymphadenopathy. Minimal pericardial fluid. Lungs/Pleura: Again noted are bilateral pleural effusions which have increased on the left side. Left pleural effusion is small to moderate in size. The trachea and mainstem bronchi are patent. Complete collapse and consolidation in the left lower lobe. Chronic scarring at the lung apices. Again noted are scattered peripheral densities throughout both lungs, some of which were present on the previous examination. Again noted are ground-glass opacities along the anterior lungs. Some of these ground-glass opacities are new or changed from the previous examination. New focal nodular consolidation along the medial right upper lobe on sequence 3, image 70. There is also focal nodular consolidation or opacity along the medial aspect of the lingula on sequence 2, image 67. Upper Abdomen: Limited evaluation of the upper abdomen without gross abnormality. Musculoskeletal: No acute bone abnormality. IMPRESSION: Complete collapse and consolidation in the left lower lobe. Bilateral pleural effusions. Left pleural effusion has slightly enlarged since the previous CT. Again noted are scattered peripheral densities and ground-glass opacities. New the focal nodular opacities along the medial upper lobes as described. Findings are nonspecific but  probably represent acute on chronic inflammatory changes or infection. Cardiomegaly. Electronically Signed   By: Richarda Overlie M.D.   On: 02/21/2016 10:15    LINES:   CULTURES: Pleural Fluid 8/9>  ANTIBIOTICS Levaquin 8/9>  ASSESSMENT / PLAN: 82 year old female with left lung consolidation, left pleural effusion, respiratory distress.  Acute respiratory failure with hypoxia Bilateral pleural effusion, left greater than right COPD Left lung  atelectasis/collapse Leukocytosis Constipation SIADH Chronic hyponatremia  Plan: -Ultrasound guided thoracentesis,  fluid for studies including microbiology, LDH, pH, glucose, protein - Do not recommend another thoracentesis if a reaccumulation of fluid occurs, risk for pneumothorax is greatly increase with subsequent thoracenteses. -Continue with Pulmicort nebulizer, will schedule DuoNeb's every 8 hours with X3 days -Pulmonary toilet to include chest percussion, incentive spirometry and flutter valve -CT chest reviewed, pleural fluid is most likely causing atelectasis and collapse of certain areas of the left lung, once fluid is removed I expect left lung to reexpand. -Continue with current antibiotics -Pulmonary toilet and chest physiotherapy will be the most prominent interventions for clinical improvement in this particular patient, with respect to pulmonary status  Thank you for consulting [facility], Please feel free to contacts [place] with any questions at [phone] (please enter 7-digits).  I have personally obtained a history, examined the patient, evaluated laboratory and imaging results, formulated the assessment and plan and placed orders.  The Patient requires high complexity decision making for assessment and support, frequent evaluation and titration of therapies, application of advanced monitoring technologies and extensive interpretation of multiple databases.  Pulmonary consult Time devoted to patient care services described in this note is 45 minutes.    Stephanie Acre, MD [facility] Pager [phone] (please enter 7-digits) On Call Pager - [phone] (please enter 7-digits)  Note: This note was prepared with Dragon dictation along with smaller phrase technology. Any transcriptional errors that result from this process are unintentional.

## 2016-02-21 NOTE — Progress Notes (Signed)
Initial Heart Failure Clinic appointment scheduled for March 02, 2016 at 12:00pm. Thank you.

## 2016-02-21 NOTE — Discharge Instructions (Signed)
Heart Failure Clinic appointment on March 02, 2016 at 12:00pm with [name], FNP. Please call [phone] to reschedule.

## 2016-02-21 NOTE — Progress Notes (Signed)
Pt. Is too sleepy to perform flutter valve at this time. Will follow up with night shift therapist to instruct.

## 2016-02-22 DIAGNOSIS — R0602 Shortness of breath: Secondary | ICD-10-CM

## 2016-02-22 DIAGNOSIS — R0600 Dyspnea, unspecified: Secondary | ICD-10-CM

## 2016-02-22 DIAGNOSIS — R0902 Hypoxemia: Secondary | ICD-10-CM

## 2016-02-22 DIAGNOSIS — J432 Centrilobular emphysema: Secondary | ICD-10-CM

## 2016-02-22 DIAGNOSIS — J9811 Atelectasis: Secondary | ICD-10-CM

## 2016-02-22 DIAGNOSIS — J90 Pleural effusion, not elsewhere classified: Secondary | ICD-10-CM

## 2016-02-22 LAB — MISC LABCORP TEST (SEND OUT): Labcorp test code: 19588

## 2016-02-22 LAB — SODIUM: Sodium: 124 mmol/L — ABNORMAL LOW (ref 135–145)

## 2016-02-22 MED ORDER — VANCOMYCIN HCL IN DEXTROSE 750-5 MG/150ML-% IV SOLN
750.0000 mg | Freq: Once | INTRAVENOUS | Status: AC
  Administered 2016-02-22: 750 mg via INTRAVENOUS
  Filled 2016-02-22: qty 150

## 2016-02-22 MED ORDER — SODIUM CHLORIDE 0.9 % IV SOLN
500.0000 mg | INTRAVENOUS | Status: DC
  Administered 2016-02-23 – 2016-02-25 (×3): 500 mg via INTRAVENOUS
  Filled 2016-02-22 (×3): qty 500

## 2016-02-22 MED ORDER — MAGNESIUM CITRATE PO SOLN
0.5000 | Freq: Once | ORAL | Status: AC
  Administered 2016-02-22: 0.5 via ORAL
  Filled 2016-02-22: qty 296

## 2016-02-22 MED ORDER — LACTULOSE 10 GM/15ML PO SOLN
30.0000 g | Freq: Two times a day (BID) | ORAL | Status: DC
  Administered 2016-02-22: 30 g via ORAL
  Filled 2016-02-22: qty 60

## 2016-02-22 MED ORDER — ALBUTEROL SULFATE (2.5 MG/3ML) 0.083% IN NEBU
2.5000 mg | INHALATION_SOLUTION | Freq: Three times a day (TID) | RESPIRATORY_TRACT | Status: DC
  Administered 2016-02-22 – 2016-02-25 (×9): 2.5 mg via RESPIRATORY_TRACT
  Filled 2016-02-22 (×9): qty 3

## 2016-02-22 MED ORDER — ENOXAPARIN SODIUM 30 MG/0.3ML ~~LOC~~ SOLN
30.0000 mg | SUBCUTANEOUS | Status: DC
  Administered 2016-02-22 – 2016-02-24 (×3): 30 mg via SUBCUTANEOUS
  Filled 2016-02-22 (×3): qty 0.3

## 2016-02-22 MED ORDER — VANCOMYCIN HCL IN DEXTROSE 750-5 MG/150ML-% IV SOLN
750.0000 mg | Freq: Once | INTRAVENOUS | Status: DC
  Filled 2016-02-22: qty 150

## 2016-02-22 NOTE — Consult Note (Signed)
PULMONARY / CRITICAL CARE MEDICINE   Name: Alyssa Landry MRN: [id number] DOB: Feb 18, 1919   Referring Physician - Dr. Winona Legato CC: "short of breath" Reason for consult - pleural effusion   ADMISSION DATE:  02/18/2016  HISTORY: 82 year old female past medical history of atrial fibrillation, GERD, COPD, lipidemia, hypertension, pulmonary fibrosis, SIADH with chronic hyponatremia, presented on 89 with complaints of shortness of breath. This treated per daughter who is in the room, she is also healthcare power of attorney. Stated patient started having some shortness of breath Thursday prior to admission, saw her PMD who stated that they couldn't auscultate much breath sounds on the left side, had a urine culture done that showed positive MRSA in the urine and was sent to the hospital for further evaluation. During the hospitalization her chest x-ray revealed increase in basilar opacities and left-sided greater than right-sided pleural effusion, along with a sodium level on admission of 116. She still with cough, no fever, no sputum production. S/p thoracentesis   Subjective Resting comfortably, poor appetite, NAD On minimal oxygen  STUDIES:  CT chest 02/21/16-bilateral pleural effusion, left greater than right, peripheral groundglass opacity, consolidation of the left lower lobe and possible collapse   US guided Thoracentesis 8/11 550 cc's serous fludi removed c/w transudative process  SIGNIFICANT EVENTS: 8/9  VITAL SIGNS: Temp:  [97.4 F (36.3 C)-98.7 F (37.1 C)] 98.7 F (37.1 C) (08/12 0452) Pulse Rate:  [72-91] 82 (08/12 0452) Resp:  [18-20] 18 (08/12 0452) BP: (136-158)/(61-72) 158/63 (08/12 0452) SpO2:  [97 %-99 %] 97 % (08/12 0553) Weight:  [118 lb 14.4 oz (53.9 kg)] 118 lb 14.4 oz (53.9 kg) (08/12 0500) HEMODYNAMICS:     Intake/Output Summary (Last 24 hours) at 02/22/16 0909 Last data filed at 02/22/16 0400  Gross per 24 hour  Intake              120 ml  Output               800 ml  Net             -680 ml    Review of Systems  Constitutional: Negative for chills and fever.  Eyes: Negative for blurred vision and double vision.  Respiratory: Positive for cough, shortness of breath and wheezing. Negative for sputum production.   Cardiovascular: Negative for chest pain.  Gastrointestinal: Positive for constipation. Negative for blood in stool, diarrhea, heartburn and nausea.  Genitourinary: Positive for frequency. Negative for dysuria.  Musculoskeletal: Negative for myalgias.  Skin: Negative for rash.  Neurological: Negative for dizziness and headaches.  Endo/Heme/Allergies: Does not bruise/bleed easily.    Physical Exam  Nursing note and vitals reviewed.  GENERAL:  mild respiratory distress, some tachypneic, especially on exertion and with speech, but overall less uncomfortable.  EYES: Pupils equal, round, reactive to light and accommodation. No scleral icterus. Extraocular muscles intact.  HEENT: Head atraumatic, normocephalic. Oropharynx and nasopharynx clear.  NECK:  Supple, no jugular venous distention. No thyroid enlargement, no tenderness.  LUNGS: dec bs on right, absent BS on the left,  CARDIOVASCULAR: S1, S2 normal. No murmurs, rubs, or gallops.  ABDOMEN: Soft, nontender, nondistended. Bowel sounds present. No organomegaly or mass.  EXTREMITIES: 1+ lower extremity and pedal edema, no cyanosis, or clubbing.  NEUROLOGIC: Cranial nerves II through XII are intact. Muscle strength 5/5 in all extremities. Sensation intact. Gait not checked.  PSYCHIATRIC: The patient is alert today, able to interact, answerquestions  SKIN: No obvious rash, lesion, or ulcer.  LABS:  CBC  Recent Labs Lab 02/18/16 1946 02/19/16 0253  WBC 13.1* 11.0  HGB 10.6* 10.9*  HCT 29.9* 30.2*  PLT 394 346   Coag's No results for input(s): APTT, INR in the last 168 hours. BMET  Recent Labs Lab 02/19/16 0253  02/20/16 0457 02/21/16 0507 02/22/16 0543  NA  116*  < > 117* 119* 124*  K 3.5  --  4.6 4.3  --   CL 70*  --  72* 72*  --   CO2 36*  --  36* 40*  --   BUN 5*  --  12 17  --   CREATININE 0.42*  --  0.52 0.54  --   GLUCOSE 213*  --  137* 137*  --   < > = values in this interval not displayed. Electrolytes  Recent Labs Lab 02/19/16 0253 02/20/16 0457 02/21/16 0507  CALCIUM 8.4* 9.2 9.0   Sepsis Markers No results for input(s): LATICACIDVEN, PROCALCITON, O2SATVEN in the last 168 hours. ABG No results for input(s): PHART, PCO2ART, PO2ART in the last 168 hours. Liver Enzymes  Recent Labs Lab 02/18/16 1946 02/19/16 0253  AST 20 19  ALT 15 16  ALKPHOS 116 109  BILITOT 0.6 0.5  ALBUMIN 3.5 3.4*   Cardiac Enzymes  Recent Labs Lab 02/19/16 0253 02/19/16 0830 02/19/16 1348  TROPONINI <0.03 <0.03 <0.03   Glucose No results for input(s): GLUCAP in the last 168 hours.  Imaging Dg Chest 1 View  Result Date: 02/21/2016 CLINICAL DATA:  Post left-sided thoracentesis. EXAM: CHEST 1 VIEW COMPARISON:  Chest radiograph - 02/18/2016; chest CT - 02/21/2016 FINDINGS: Grossly unchanged cardiac silhouette and mediastinal contours with atherosclerotic plaque within the thoracic aorta. Interval reduction in persistent small / trace left-sided effusion post thoracentesis. No pneumothorax. Improved aeration of left lower lung with residual left basilar opacities, likely atelectasis. Unchanged small, potentially partially loculated right-sided effusion with associated right basilar heterogeneous opacities. No new focal airspace opacities. Unchanged mild cephalization of flow. Unchanged bones. IMPRESSION: 1. Interval reduction in persistent small/trace left-sided effusion post thoracentesis. No pneumothorax. 2. Unchanged small potentially partially loculated right-sided effusion with associated bibasilar opacities, right greater than left, likely atelectasis. 3. Similar findings of cardiomegaly and pulmonary venous congestion. 4.  Aortic  Atherosclerosis (ICD10-170.0) Electronically Signed   By: Simonne Come M.D.   On: 02/21/2016 14:53   Dg Abd 1 View  Result Date: 02/21/2016 CLINICAL DATA:  Constipation. EXAM: ABDOMEN - 1 VIEW COMPARISON:  02/19/2016 FINDINGS: There is moderate stool throughout nondilated loops of colon with some improvement compared with prior study. No evidence for bowel obstruction. No free intraperitoneal air. Degenerative changes are seen in the spine. IMPRESSION: Persistent significant stool within the colon. There has been some improvement in stool burden compared to prior study. Nonobstructive bowel gas pattern. Electronically Signed   By: Norva Pavlov M.D.   On: 02/21/2016 12:34   Ct Chest Wo Contrast  Result Date: 02/21/2016 CLINICAL DATA:  Hypoxia.  Multiple medical problems. EXAM: CT CHEST WITHOUT CONTRAST TECHNIQUE: Multidetector CT imaging of the chest was performed following the standard protocol without IV contrast. COMPARISON:  01/27/2016 FINDINGS: Cardiovascular: Atherosclerotic calcifications in the thoracic aorta without aneurysmal dilatation. Enlargement of the right atrium. Coronary artery calcifications. Mediastinum/Nodes: Precarinal node measures 1.2 cm in the short axis on sequence 2, image 52 and minimally changed from the recent comparison examination. Additional small mediastinal lymph nodes. Limited evaluation for hilar lymphadenopathy due to the lack of intravenous contrast. No axillary lymphadenopathy.  Minimal pericardial fluid. Lungs/Pleura: Again noted are bilateral pleural effusions which have increased on the left side. Left pleural effusion is small to moderate in size. The trachea and mainstem bronchi are patent. Complete collapse and consolidation in the left lower lobe. Chronic scarring at the lung apices. Again noted are scattered peripheral densities throughout both lungs, some of which were present on the previous examination. Again noted are ground-glass opacities along the  anterior lungs. Some of these ground-glass opacities are new or changed from the previous examination. New focal nodular consolidation along the medial right upper lobe on sequence 3, image 70. There is also focal nodular consolidation or opacity along the medial aspect of the lingula on sequence 2, image 67. Upper Abdomen: Limited evaluation of the upper abdomen without gross abnormality. Musculoskeletal: No acute bone abnormality. IMPRESSION: Complete collapse and consolidation in the left lower lobe. Bilateral pleural effusions. Left pleural effusion has slightly enlarged since the previous CT. Again noted are scattered peripheral densities and ground-glass opacities. New the focal nodular opacities along the medial upper lobes as described. Findings are nonspecific but probably represent acute on chronic inflammatory changes or infection. Cardiomegaly. Electronically Signed   By: Richarda Overlie M.D.   On: 02/21/2016 10:15   US Thoracentesis Asp Pleural Space W/img Guide  Result Date: 02/21/2016 INDICATION: Symptomatic left sided pleural effusion - please perform ultrasound-guided thoracentesis for therapeutic and diagnostic purposes EXAM: US THORACENTESIS ASP PLEURAL SPACE W/IMG GUIDE COMPARISON:  Chest radiograph - 02/18/2016; chest CT - 02/20/2026 MEDICATIONS: None. COMPLICATIONS: None immediate. TECHNIQUE: Informed written consent was obtained from the patient after a discussion of the risks, benefits and alternatives to treatment. A timeout was performed prior to the initiation of the procedure. Initial ultrasound scanning demonstrates a small to moderate-sized left-sided pleural effusion. The lower chest was prepped and draped in the usual sterile fashion. 1% lidocaine was used for local anesthesia. An ultrasound image was saved for documentation purposes. An 8 Fr Safe-T-Centesis catheter was introduced. The thoracentesis was performed. The catheter was removed and a dressing was applied. The patient  tolerated the procedure well without immediate post procedural complication. The patient was escorted to have an upright chest radiograph. FINDINGS: A total of approximately 550 cc of serous fluid was removed. Requested samples were sent to the laboratory. IMPRESSION: Successful ultrasound-guided left sided thoracentesis yielding 550 cc of pleural fluid. Electronically Signed   By: Simonne Come M.D.   On: 02/21/2016 14:54    LINES:   CULTURES: Pleural Fluid 8/9>  ANTIBIOTICS Levaquin 8/9>  ASSESSMENT / PLAN: 82 year old female with left lung consolidation, left pleural effusion, respiratory distress.  Acute respiratory failure with hypoxia Bilateral pleural effusion, left greater than right COPD Left lung atelectasis/collapse Leukocytosis Constipation SIADH Chronic hyponatremia  Plan: -s/p Ultrasound guided thoracentesis-transudative process - Do not recommend another thoracentesis if a reaccumulation of fluid occurs, risk for pneumothorax is greatly increase with subsequent thoracenteses. -Continue with Pulmicort nebulizer, will schedule DuoNeb's every 8 hours with X3 days -Pulmonary toilet to include chest percussion, incentive spirometry and flutter valve -CT chest reviewed, pleural fluid is most likely causing atelectasis and collapse of certain areas of the left lung, once fluid is removed I expect left lung to reexpand. -Continue with current antibiotics -Pulmonary toilet and chest physiotherapy will be the most prominent interventions for clinical improvement in this particular patient, with respect to pulmonary status   The Patient requires high complexity decision making for assessment and support, frequent evaluation and titration of therapies.  Patient/Family  are satisfied with Plan of action and management. All questions answered  Lucie LeatherKurian David Naiah Donahoe, M.D.  [facility]  Medical Director [facility] Medical Director [facility]  Cardio-Pulmonary Department

## 2016-02-22 NOTE — Progress Notes (Signed)
[employer] - [place] [facility]   PATIENT NAME: Alyssa Landry    MR#:  [id number]  DATE OF BIRTH:  [birth year]  SUBJECTIVE:   Shortness of breath improved, status post ultrasound-guided thoracentesis yesterday with 550 cc of fluid removed. Sodium improved. Daughter at bedside, no other acute events overnight.  REVIEW OF SYSTEMS:    Review of Systems  Constitutional: Negative for chills and fever.  HENT: Negative for congestion and tinnitus.   Eyes: Negative for blurred vision and double vision.  Respiratory: Positive for shortness of breath (improved). Negative for cough and wheezing.   Cardiovascular: Negative for chest pain, orthopnea and PND.  Gastrointestinal: Negative for abdominal pain, diarrhea, nausea and vomiting.  Genitourinary: Negative for dysuria and hematuria.  Neurological: Positive for weakness (generalized). Negative for dizziness, sensory change and focal weakness.  All other systems reviewed and are negative.   Nutrition: Dysphagia 1 Tolerating Diet: Yes Tolerating PT: Await Eval.   DRUG ALLERGIES:   Allergies  Allergen Reactions  . Codeine Other (See Comments)    Reaction:  Unknown   . Ditropan [Oxybutynin] Other (See Comments)    Reaction:  Unknown   . Hctz [Hydrochlorothiazide] Other (See Comments)    Reaction:  Causes pts sodium/potassium levels to drop significantly   . Metronidazole Other (See Comments)    Reaction:  Unknown   . Propranolol Other (See Comments)    Reaction:  Unknown   . Tavist [Clemastine] Other (See Comments)    Reaction:  Unknown   . Tramadol Other (See Comments)    Reaction:  Unknown   . Vicodin [Hydrocodone-Acetaminophen] Other (See Comments)    Reaction:  Unknown     VITALS:  Blood pressure (!) 158/63, pulse 82, temperature 98.7 F (37.1 C), resp. rate 18, height  (1.448 m), weight 53.9 kg (118 lb 14.4 oz), SpO2 97 %.  PHYSICAL EXAMINATION:   Physical Exam  GENERAL:  82 y.o.-year-old  patient lying in the bed in no acute distress.  EYES: Pupils equal, round, reactive to light and accommodation. No scleral icterus. Extraocular muscles intact.  HEENT: Head atraumatic, normocephalic. Oropharynx and nasopharynx clear.  NECK:  Supple, no jugular venous distention. No thyroid enlargement, no tenderness.  LUNGS: Normal breath sounds bilaterally, no wheezing, rhonchi, bibasilar rales. No use of accessory muscles of respiration.  CARDIOVASCULAR: S1, S2 normal. No murmurs, rubs, or gallops.  ABDOMEN: Soft, nontender, nondistended. Bowel sounds present. No organomegaly or mass.  EXTREMITIES: No cyanosis, clubbing or edema b/l.    NEUROLOGIC: Cranial nerves II through XII are intact. No focal Motor or sensory deficits b/l. Globally weak.  PSYCHIATRIC: The patient is alert and oriented x 3.  SKIN: No obvious rash, lesion, or ulcer.    LABORATORY PANEL:   CBC  Recent Labs Lab 02/19/16 0253  WBC 11.0  HGB 10.9*  HCT 30.2*  PLT 346   ------------------------------------------------------------------------------------------------------------------  Chemistries   Recent Labs Lab 02/19/16 0253  02/21/16 0507 02/22/16 0543  NA 116*  < > 119* 124*  K 3.5  < > 4.3  --   CL 70*  < > 72*  --   CO2 36*  < > 40*  --   GLUCOSE 213*  < > 137*  --   BUN 5*  < > 17  --   CREATININE 0.42*  < > 0.54  --   CALCIUM 8.4*  < > 9.0  --   AST 19  --   --   --  ALT 16  --   --   --   ALKPHOS 109  --   --   --   BILITOT 0.5  --   --   --   < > = values in this interval not displayed. ------------------------------------------------------------------------------------------------------------------  Cardiac Enzymes  Recent Labs Lab 02/19/16 1348  TROPONINI <0.03   ------------------------------------------------------------------------------------------------------------------  RADIOLOGY:  Dg Chest 1 View  Result Date: 02/21/2016 CLINICAL DATA:  Post left-sided thoracentesis.  EXAM: CHEST 1 VIEW COMPARISON:  Chest radiograph - 02/18/2016; chest CT - 02/21/2016 FINDINGS: Grossly unchanged cardiac silhouette and mediastinal contours with atherosclerotic plaque within the thoracic aorta. Interval reduction in persistent small / trace left-sided effusion post thoracentesis. No pneumothorax. Improved aeration of left lower lung with residual left basilar opacities, likely atelectasis. Unchanged small, potentially partially loculated right-sided effusion with associated right basilar heterogeneous opacities. No new focal airspace opacities. Unchanged mild cephalization of flow. Unchanged bones. IMPRESSION: 1. Interval reduction in persistent small/trace left-sided effusion post thoracentesis. No pneumothorax. 2. Unchanged small potentially partially loculated right-sided effusion with associated bibasilar opacities, right greater than left, likely atelectasis. 3. Similar findings of cardiomegaly and pulmonary venous congestion. 4.  Aortic Atherosclerosis (ICD10-170.0) Electronically Signed   By: Simonne ComeJohn  Watts M.D.   On: 02/21/2016 14:53   Dg Abd 1 View  Result Date: 02/21/2016 CLINICAL DATA:  Constipation. EXAM: ABDOMEN - 1 VIEW COMPARISON:  02/19/2016 FINDINGS: There is moderate stool throughout nondilated loops of colon with some improvement compared with prior study. No evidence for bowel obstruction. No free intraperitoneal air. Degenerative changes are seen in the spine. IMPRESSION: Persistent significant stool within the colon. There has been some improvement in stool burden compared to prior study. Nonobstructive bowel gas pattern. Electronically Signed   By: Norva PavlovElizabeth  Brown M.D.   On: 02/21/2016 12:34   Ct Chest Wo Contrast  Result Date: 02/21/2016 CLINICAL DATA:  Hypoxia.  Multiple medical problems. EXAM: CT CHEST WITHOUT CONTRAST TECHNIQUE: Multidetector CT imaging of the chest was performed following the standard protocol without IV contrast. COMPARISON:  01/27/2016 FINDINGS:  Cardiovascular: Atherosclerotic calcifications in the thoracic aorta without aneurysmal dilatation. Enlargement of the right atrium. Coronary artery calcifications. Mediastinum/Nodes: Precarinal node measures 1.2 cm in the short axis on sequence 2, image 52 and minimally changed from the recent comparison examination. Additional small mediastinal lymph nodes. Limited evaluation for hilar lymphadenopathy due to the lack of intravenous contrast. No axillary lymphadenopathy. Minimal pericardial fluid. Lungs/Pleura: Again noted are bilateral pleural effusions which have increased on the left side. Left pleural effusion is small to moderate in size. The trachea and mainstem bronchi are patent. Complete collapse and consolidation in the left lower lobe. Chronic scarring at the lung apices. Again noted are scattered peripheral densities throughout both lungs, some of which were present on the previous examination. Again noted are ground-glass opacities along the anterior lungs. Some of these ground-glass opacities are new or changed from the previous examination. New focal nodular consolidation along the medial right upper lobe on sequence 3, image 70. There is also focal nodular consolidation or opacity along the medial aspect of the lingula on sequence 2, image 67. Upper Abdomen: Limited evaluation of the upper abdomen without gross abnormality. Musculoskeletal: No acute bone abnormality. IMPRESSION: Complete collapse and consolidation in the left lower lobe. Bilateral pleural effusions. Left pleural effusion has slightly enlarged since the previous CT. Again noted are scattered peripheral densities and ground-glass opacities. New the focal nodular opacities along the medial upper lobes as described. Findings are  nonspecific but probably represent acute on chronic inflammatory changes or infection. Cardiomegaly. Electronically Signed   By: Richarda Overlie M.D.   On: 02/21/2016 10:15   US Thoracentesis Asp Pleural Space  W/img Guide  Result Date: 02/21/2016 INDICATION: Symptomatic left sided pleural effusion - please perform ultrasound-guided thoracentesis for therapeutic and diagnostic purposes EXAM: US THORACENTESIS ASP PLEURAL SPACE W/IMG GUIDE COMPARISON:  Chest radiograph - 02/18/2016; chest CT - 02/20/2026 MEDICATIONS: None. COMPLICATIONS: None immediate. TECHNIQUE: Informed written consent was obtained from the patient after a discussion of the risks, benefits and alternatives to treatment. A timeout was performed prior to the initiation of the procedure. Initial ultrasound scanning demonstrates a small to moderate-sized left-sided pleural effusion. The lower chest was prepped and draped in the usual sterile fashion. 1% lidocaine was used for local anesthesia. An ultrasound image was saved for documentation purposes. An 8 Fr Safe-T-Centesis catheter was introduced. The thoracentesis was performed. The catheter was removed and a dressing was applied. The patient tolerated the procedure well without immediate post procedural complication. The patient was escorted to have an upright chest radiograph. FINDINGS: A total of approximately 550 cc of serous fluid was removed. Requested samples were sent to the laboratory. IMPRESSION: Successful ultrasound-guided left sided thoracentesis yielding 550 cc of pleural fluid. Electronically Signed   By: Simonne Come M.D.   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  1. Acute on Chronic respiratory failure with hypoxia-secondary to CHF, COPD. -Status post ultrasound-guided thoracentesis yesterday with 5 cc of fluid removed. Fluid is transudative in nature. -Continue treatment for COPD with DuoNeb's, IV steroids, empiric  antibiotics. -Wean O2 as tolerated.  2. COPD exacerbation-continue IV steroids, but will taper. Continue scheduled DuoNeb's, Spiriva. -Continue Pulmicort nebs. Clinically improving.  3. CHF-acute on chronic diastolic dysfunction. -Continue diuresis with IV Lasix, continue Cardizem  4. Hyponatremia-chronic in nature and secondary to SIADH. -Sodium improving, continue fluid restriction, salt tabs.  5. Essential hypertension-continue clonidine, Cardizem.  6. GERD-continue Pepcid.  7. Urinary incontinence as continue oxybutynin.  8. Urinary tract infection-patient has urine cultures positive from 02/13/2016 for enterococcus and MRSA. -We'll start on IV vancomycin.  9. Constipation - cont. Miralax, will give some Mg. Citrate today as per GI.  Appreciate GI input.   - no evidence of obstruction.   Await physical therapy evaluation.  All the records are reviewed and case discussed with Care Management/Social Workerr. Management plans discussed with the patient, family and they are in agreement.  CODE STATUS: DO NOT RESUSCITATE  DVT Prophylaxis: Lovenox  TOTAL TIME TAKING CARE OF THIS PATIENT: 30 minutes.   POSSIBLE D/C IN 2-3 DAYS, DEPENDING ON CLINICAL CONDITION.   Houston Siren M.D on 02/22/2016 at 1:43 PM  Between 7am to 6pm - Pager - [phone]  After 6pm go to [email or web address] - password [email or web address] [facility]  [facility]  Office  [phone]  CC: Primary care physician; Rafael Bihari, MD

## 2016-02-22 NOTE — Progress Notes (Signed)
Speech Language Pathology Dysphagia Treatment Patient Details Name: Alyssa Landry MRN: [id number] DOB: 01-04-1919 Today's Date: 02/22/2016 Time: 0931-1000 SLP Time Calculation (min) (ACUTE ONLY): 29 min  Assessment / Plan / Recommendation Clinical Impression   pt continues to present with a mild to moderate dysphagia characterized by an inability to masticate foods safely and effectively without becoming too fatigued. Pt requires several breaks during meal intake. Pt and grandson educated to continue to provide frequent breaks and sit up as close to 90 degrees during intake as possible. Pt was utilizing break from am meal upon st entrance and only intake few bites. Pt with decreased respiratory status during intake and slp discontinued trials. Pt with no complaints per current diet consistency. SLP continues to recommend dys 1 with thin with frequent breaks to respiratory recovery throughout intake.     Diet Recommendation    Dys 1 with thin, no straws, frequent breaks   SLP Plan Continue with current plan of care   Pertinent Vitals/Pain No pain reported.    Swallowing Goals     General Behavior/Cognition: Alert;Cooperative;Pleasant mood Patient Positioning: Upright in bed Oral care provided: N/A HPI: Pt is a 82 y.o. female with a past medical history of atrial fibrillation, COPD, CHF, who presents the emergency department for shortness of breath starting this morning. According to the patient since this morning she has felt considerable shortness of breath. Denies cough, congestion, fever, sputum production. Denies chest pain. Patient was recently started on 2 L oxygen via nasal cannula 01/30/16. Pt is having increased respiratory effort since not feeling well w/ constipation per Dtr. Pt recevied tx for the constipation this morning. Pt also has a dxs of Reflux and hiatal hernia w/ multiple other medical issues. When taking pills w/ puree this morning, pt attempted to use sips of water to  "wash it down" then began coughing per report. Pt does use a puree to swallow pills w/ at baseline per Dtr. Of note, a recent CT(2017) stated Patulous thoracic esophagus with fluid level, suggesting Esophageal dysmotility or Reflux. Dtr endorsded a hiatal hernia which could be impacting Esophageal motility as well.   Oral Cavity - Oral Hygiene     Dysphagia Treatment Family/Caregiver Educated: grandson Treatment Methods: Skilled observation;Compensation strategy training;Patient/caregiver education Patient observed directly with PO's: Yes Type of PO's observed: Dysphagia 1 (puree);Thin liquids Feeding: Needs assist Liquids provided via: Teaspoon;Cup Oral Phase Signs & Symptoms: Prolonged mastication Pharyngeal Phase Signs & Symptoms: Delayed cough Type of cueing: Verbal;Tactile Amount of cueing: Minimal   GO     Alyssa PelStacie Harris Landry 02/22/2016, 10:16 AM

## 2016-02-22 NOTE — Consult Note (Signed)
Consultation  Referring Provider:      Primary Care Physician:  Rafael Bihari, MD Primary Gastroenterologist:         Reason for Consultation:       Constipation. Patient has had one bowel movement yesterday since admission. Abdominal film shows diffuse stool throughout the colon but no bowel loop distention. Some improvement compare to past films.      Impression / Plan:   Constipation: Agree with present management. No  signs of bowel overdistention at this time. Continue with Miralax 2-3 TBSPS a day.  I prefer Magnesium Citrate over Lactulose since this can be gas forming.  If no results start fleet enemas to bid.  Avoid Golytely prep since too much volume.              HPI:   Alyssa Landry is a 82 y.o. female   Past Medical History:  Diagnosis Date  . A-fib (HCC)   . Acid reflux   . Anemia   . Anxiety   . Arthritis   . Asthma   . Chronic hyponatremia   . COPD (chronic obstructive pulmonary disease) (HCC)   . GERD (gastroesophageal reflux disease)   . Hiatal hernia   . Hiatal hernia   . Hyperlipidemia   . Hypertension   . Hypothyroidism   . Incontinence   . Migraine   . Osteoporosis, post-menopausal   . Persistent cough   . Pulmonary fibrosis (HCC)   . SIADH (syndrome of inappropriate ADH production) (HCC)   . TIA (transient ischemic attack)     Past Surgical History:  Procedure Laterality Date  . ABDOMINAL HYSTERECTOMY  1966    Family History  Problem Relation Age of Onset  . Leukemia Mother   . Heart disease Father   . Hypertension       Social History  Substance Use Topics  . Smoking status: Never Smoker  . Smokeless tobacco: Never Used  . Alcohol use No    Prior to Admission medications   Medication Sig Start Date End Date Taking? Authorizing Provider  albuterol (PROVENTIL HFA;VENTOLIN HFA) 108 (90 BASE) MCG/ACT inhaler Inhale 1-2 puffs into the lungs every 4 (four) hours as needed for wheezing or shortness of breath.    Historical  Provider, MD  amLODipine (NORVASC) 10 MG tablet Take 10 mg by mouth daily.    Historical Provider, MD  aspirin EC 81 MG tablet Take 81 mg by mouth every evening.     Historical Provider, MD  benzonatate (TESSALON) 100 MG capsule Take 100 mg by mouth 3 (three) times daily as needed for cough.    Historical Provider, MD  cholecalciferol (VITAMIN D) 1000 UNITS tablet Take 1,000 Units by mouth daily.    Historical Provider, MD  cloNIDine (CATAPRES) 0.1 MG tablet Take 0.1 mg by mouth 3 (three) times daily.    Historical Provider, MD  clopidogrel (PLAVIX) 75 MG tablet Take 75 mg by mouth daily.    Historical Provider, MD  diltiazem (CARDIZEM CD) 120 MG 24 hr capsule Take 1 capsule (120 mg total) by mouth daily. 12/15/15   Wyatt Haste, MD  docusate sodium (COLACE) 100 MG capsule Take 100 mg by mouth 2 (two) times daily.     Historical Provider, MD  feeding supplement, ENSURE ENLIVE, (ENSURE ENLIVE) LIQD Take 237 mLs by mouth daily.     Historical Provider, MD  ferrous sulfate 325 (65 FE) MG tablet Take 325 mg by mouth 2 (two) times daily with  a meal.     Historical Provider, MD  fluticasone (FLONASE) 50 MCG/ACT nasal spray Place 2 sprays into both nostrils every morning.     Historical Provider, MD  fluticasone (FLOVENT HFA) 110 MCG/ACT inhaler Inhale 2 puffs into the lungs 2 (two) times daily.    Historical Provider, MD  furosemide (LASIX) 20 MG tablet Take 1 tablet (20 mg total) by mouth daily. 01/30/16   Houston Siren, MD  levofloxacin (LEVAQUIN) 500 MG tablet Take 1 tablet (500 mg total) by mouth daily. 01/30/16   Houston Siren, MD  levothyroxine (SYNTHROID, LEVOTHROID) 100 MCG tablet Take 100 mcg by mouth daily before breakfast.    Historical Provider, MD  LORazepam (ATIVAN) 0.5 MG tablet Take 0.5 mg by mouth 3 (three) times daily.     Historical Provider, MD  losartan (COZAAR) 50 MG tablet Take 50 mg by mouth daily.    Historical Provider, MD  Multiple Vitamin (MULTIVITAMIN WITH MINERALS) TABS  tablet Take 1 tablet by mouth daily.    Historical Provider, MD  Oxybutynin Chloride 10 % GEL Place 1 application onto the skin at bedtime.    Historical Provider, MD  polyethylene glycol (MIRALAX / GLYCOLAX) packet Take 17 g by mouth daily. *Mix in 4-8 ounces of fluid prior to taking*    Historical Provider, MD  predniSONE (DELTASONE) 10 MG tablet Label  & dispense according to the schedule below. 5 Pills PO for 1 day then, 4 Pills PO for 1 day, 3 Pills PO for 1 day, 2 Pills PO for 1 day, 1 Pill PO for 1 days then STOP. 01/30/16   Houston Siren, MD  ranitidine (ZANTAC) 150 MG tablet Take 150 mg by mouth 2 (two) times daily before a meal.     Historical Provider, MD  sodium bicarbonate 325 MG tablet Take 325 mg by mouth 4 (four) times daily.    Historical Provider, MD  tiotropium (SPIRIVA) 18 MCG inhalation capsule Place 18 mcg into inhaler and inhale daily.    Historical Provider, MD  vitamin B-12 (CYANOCOBALAMIN) 1000 MCG tablet Take 1,000 mcg by mouth daily.    Historical Provider, MD    Current Facility-Administered Medications  Medication Dose Route Frequency Provider Last Rate Last Dose  . aspirin EC tablet 81 mg  81 mg Oral QPM Alexis Hugelmeyer, DO   81 mg at 02/21/16 1842  . budesonide (PULMICORT) nebulizer solution 0.25 mg  0.25 mg Nebulization BID Alexis Hugelmeyer, DO   0.25 mg at 02/22/16 0800  . cholecalciferol (VITAMIN D) tablet 1,000 Units  1,000 Units Oral Daily Alexis Hugelmeyer, DO   1,000 Units at 02/22/16 1026  . cloNIDine (CATAPRES) tablet 0.1 mg  0.1 mg Oral TID Alexis Hugelmeyer, DO   0.1 mg at 02/22/16 1027  . clopidogrel (PLAVIX) tablet 75 mg  75 mg Oral Daily Alexis Hugelmeyer, DO   75 mg at 02/22/16 1024  . diltiazem (CARDIZEM CD) 24 hr capsule 120 mg  120 mg Oral Daily Alexis Hugelmeyer, DO   120 mg at 02/22/16 1027  . diltiazem (CARDIZEM) tablet 30 mg  30 mg Oral Once [employer], DO   Stopped at 02/19/16 0604  . famotidine (PEPCID) tablet 20 mg  20 mg Oral  Daily Katharina Caper, MD   20 mg at 02/22/16 1024  . feeding supplement (ENSURE ENLIVE) (ENSURE ENLIVE) liquid 237 mL  237 mL Oral Daily Alexis Hugelmeyer, DO   237 mL at 02/22/16 1038  . ferrous sulfate tablet 325 mg  325 mg Oral BID WC Alexis Hugelmeyer, DO   325 mg at 02/22/16 1024  . fluticasone (FLONASE) 50 MCG/ACT nasal spray 2 spray  2 spray Each Nare [id number] Alexis Hugelmeyer, DO   2 spray at 02/22/16 0558  . furosemide (LASIX) injection 20 mg  20 mg Intravenous Daily Katharina Caper, MD   20 mg at 02/22/16 1025  . guaiFENesin (MUCINEX) 12 hr tablet 600 mg  600 mg Oral BID Katharina Caper, MD   600 mg at 02/22/16 1026  . guaiFENesin-dextromethorphan (ROBITUSSIN DM) 100-10 MG/5ML syrup 10 mL  10 mL Oral Q4H PRN Katharina Caper, MD      . ipratropium-albuterol (DUONEB) 0.5-2.5 (3) MG/3ML nebulizer solution 3 mL  3 mL Nebulization Q4H PRN Alexis Hugelmeyer, DO   3 mL at 02/21/16 0852  . ipratropium-albuterol (DUONEB) 0.5-2.5 (3) MG/3ML nebulizer solution 3 mL  3 mL Nebulization Q8H Vishal Mungal, MD   3 mL at 02/22/16 0552  . lactulose (CHRONULAC) 10 GM/15ML solution 30 g  30 g Oral BID Houston Siren, MD      . levofloxacin [facility]) tablet 250 mg  250 mg Oral Daily Ihor Austin, MD   250 mg at 02/22/16 1027  . levothyroxine (SYNTHROID, LEVOTHROID) tablet 100 mcg  100 mcg Oral QAC breakfast Alexis Hugelmeyer, DO   100 mcg at 02/22/16 1026  . LORazepam (ATIVAN) tablet 0.5 mg  0.5 mg Oral TID Alexis Hugelmeyer, DO   0.5 mg at 02/22/16 1024  . losartan (COZAAR) tablet 50 mg  50 mg Oral BID Katharina Caper, MD   50 mg at 02/22/16 1025  . methylPREDNISolone sodium succinate (SOLU-MEDROL) 125 mg/2 mL injection 60 mg  60 mg Intravenous Q12H Katharina Caper, MD   60 mg at 02/21/16 2117  . morphine 2 MG/ML injection 1 mg  1 mg Intravenous QID PRN Ihor Austin, MD   1 mg at 02/19/16 1840  . multivitamin with minerals tablet 1 tablet  1 tablet Oral Daily Alexis Hugelmeyer, DO   1 tablet at 02/22/16 1025  .  ondansetron (ZOFRAN) tablet 4 mg  4 mg Oral Q6H PRN Alexis Hugelmeyer, DO       Or  . ondansetron (ZOFRAN) injection 4 mg  4 mg Intravenous Q6H PRN Alexis Hugelmeyer, DO      . oxybutynin (DITROPAN-XL) 24 hr tablet 5 mg  5 mg Oral QHS Alexis Hugelmeyer, DO   5 mg at 02/21/16 2115  . polyethylene glycol (MIRALAX / GLYCOLAX) packet 17 g  17 g Oral Daily Alexis Hugelmeyer, DO   17 g at 02/22/16 1038  . polyethylene glycol (MIRALAX / GLYCOLAX) packet 17 g  17 g Oral Daily Katharina Caper, MD   17 g at 02/22/16 1026  . sodium bicarbonate tablet 325 mg  325 mg Oral QID Alexis Hugelmeyer, DO   325 mg at 02/22/16 1026  . sodium chloride flush (NS) 0.9 % injection 3 mL  3 mL Intravenous Q12H Alexis Hugelmeyer, DO   3 mL at 02/22/16 1039  . tiotropium [facility]) inhalation capsule 18 mcg  18 mcg Inhalation Daily Alexis Hugelmeyer, DO   18 mcg at 02/21/16 1104  . [START ON 02/23/2016] vancomycin (VANCOCIN) 500 mg in sodium chloride 0.9 % 100 mL IVPB  500 mg Intravenous Q24H Houston Siren, MD      . vancomycin (VANCOCIN) IVPB 750 mg/150 ml premix  750 mg Intravenous Once Houston Siren, MD      . vitamin B-12 (CYANOCOBALAMIN) tablet 1,000 mcg  1,000  mcg Oral Daily Alexis Hugelmeyer, DO   1,000 mcg at 02/22/16 1026    Allergies as of 02/18/2016 - Review Complete 02/18/2016  Allergen Reaction Noted  . Codeine Other (See Comments) 06/26/2015  . Ditropan [oxybutynin] Other (See Comments) 06/26/2015  . Hctz [hydrochlorothiazide] Other (See Comments) 06/26/2015  . Metronidazole Other (See Comments) 06/26/2015  . Propranolol Other (See Comments) 06/26/2015  . Tavist [clemastine] Other (See Comments) 06/26/2015  . Tramadol Other (See Comments) 06/26/2015  . Vicodin [hydrocodone-acetaminophen] Other (See Comments) 06/26/2015     Review of Systems:    This is positive for those things mentioned in the HPI. All other review of systems are negative.       Physical Exam:  Vital signs in last 24  hours: Temp:  [97.4 F (36.3 C)-98.7 F (37.1 C)] 98.7 F (37.1 C) (08/12 0452) Pulse Rate:  [72-91] 82 (08/12 0452) Resp:  [18-20] 18 (08/12 0452) BP: (136-158)/(61-72) 158/63 (08/12 0452) SpO2:  [97 %-99 %] 97 % (08/12 0553) Weight:  [53.9 kg (118 lb 14.4 oz)] 53.9 kg (118 lb 14.4 oz) (08/12 0500) Last BM Date: 02/21/16  General:  Well-developed, well-nourished and in no acute distress Eyes:  anicteric. ENT:   Mouth and posterior pharynx free of lesions.  Neck:   supple w/o thyromegaly or mass.  Lungs: Clear to auscultation bilaterally. Heart:  S1S2, no rubs, murmurs, gallops. Abdomen:  soft, non-tender, no hepatosplenomegaly, hernia, or mass and BS+.  Rectal: Lymph:  no cervical or supraclavicular adenopathy. Extremities:   no edema Skin   no rash. Neuro:  A&O x 3.  Psych:  appropriate mood and  Affect.   Data Reviewed:   LAB RESULTS: No results for input(s): WBC, HGB, HCT, PLT in the last 72 hours. BMET  Recent Labs  02/20/16 0457 02/21/16 0507 02/22/16 0543  NA 117* 119* 124*  K 4.6 4.3  --   CL 72* 72*  --   CO2 36* 40*  --   GLUCOSE 137* 137*  --   BUN 12 17  --   CREATININE 0.52 0.54  --   CALCIUM 9.2 9.0  --    LFT No results for input(s): PROT, ALBUMIN, AST, ALT, ALKPHOS, BILITOT, BILIDIR, IBILI in the last 72 hours. PT/INR No results for input(s): LABPROT, INR in the last 72 hours.  STUDIES: Dg Chest 1 View  Result Date: 02/21/2016 CLINICAL DATA:  Post left-sided thoracentesis. EXAM: CHEST 1 VIEW COMPARISON:  Chest radiograph - 02/18/2016; chest CT - 02/21/2016 FINDINGS: Grossly unchanged cardiac silhouette and mediastinal contours with atherosclerotic plaque within the thoracic aorta. Interval reduction in persistent small / trace left-sided effusion post thoracentesis. No pneumothorax. Improved aeration of left lower lung with residual left basilar opacities, likely atelectasis. Unchanged small, potentially partially loculated right-sided effusion  with associated right basilar heterogeneous opacities. No new focal airspace opacities. Unchanged mild cephalization of flow. Unchanged bones. IMPRESSION: 1. Interval reduction in persistent small/trace left-sided effusion post thoracentesis. No pneumothorax. 2. Unchanged small potentially partially loculated right-sided effusion with associated bibasilar opacities, right greater than left, likely atelectasis. 3. Similar findings of cardiomegaly and pulmonary venous congestion. 4.  Aortic Atherosclerosis (ICD10-170.0) Electronically Signed   By: Simonne Come M.D.   On: 02/21/2016 14:53   Dg Abd 1 View  Result Date: 02/21/2016 CLINICAL DATA:  Constipation. EXAM: ABDOMEN - 1 VIEW COMPARISON:  02/19/2016 FINDINGS: There is moderate stool throughout nondilated loops of colon with some improvement compared with prior study. No evidence for bowel obstruction. No  free intraperitoneal air. Degenerative changes are seen in the spine. IMPRESSION: Persistent significant stool within the colon. There has been some improvement in stool burden compared to prior study. Nonobstructive bowel gas pattern. Electronically Signed   By: Norva PavlovElizabeth  Brown M.D.   On: 02/21/2016 12:34   Ct Chest Wo Contrast  Result Date: 02/21/2016 CLINICAL DATA:  Hypoxia.  Multiple medical problems. EXAM: CT CHEST WITHOUT CONTRAST TECHNIQUE: Multidetector CT imaging of the chest was performed following the standard protocol without IV contrast. COMPARISON:  01/27/2016 FINDINGS: Cardiovascular: Atherosclerotic calcifications in the thoracic aorta without aneurysmal dilatation. Enlargement of the right atrium. Coronary artery calcifications. Mediastinum/Nodes: Precarinal node measures 1.2 cm in the short axis on sequence 2, image 52 and minimally changed from the recent comparison examination. Additional small mediastinal lymph nodes. Limited evaluation for hilar lymphadenopathy due to the lack of intravenous contrast. No axillary lymphadenopathy.  Minimal pericardial fluid. Lungs/Pleura: Again noted are bilateral pleural effusions which have increased on the left side. Left pleural effusion is small to moderate in size. The trachea and mainstem bronchi are patent. Complete collapse and consolidation in the left lower lobe. Chronic scarring at the lung apices. Again noted are scattered peripheral densities throughout both lungs, some of which were present on the previous examination. Again noted are ground-glass opacities along the anterior lungs. Some of these ground-glass opacities are new or changed from the previous examination. New focal nodular consolidation along the medial right upper lobe on sequence 3, image 70. There is also focal nodular consolidation or opacity along the medial aspect of the lingula on sequence 2, image 67. Upper Abdomen: Limited evaluation of the upper abdomen without gross abnormality. Musculoskeletal: No acute bone abnormality. IMPRESSION: Complete collapse and consolidation in the left lower lobe. Bilateral pleural effusions. Left pleural effusion has slightly enlarged since the previous CT. Again noted are scattered peripheral densities and ground-glass opacities. New the focal nodular opacities along the medial upper lobes as described. Findings are nonspecific but probably represent acute on chronic inflammatory changes or infection. Cardiomegaly. Electronically Signed   By: Richarda OverlieAdam  Henn M.D.   On: 02/21/2016 10:15   [place] Thoracentesis Asp Pleural Space W/img Guide  Result Date: 02/21/2016 INDICATION: Symptomatic left sided pleural effusion - please perform ultrasound-guided thoracentesis for therapeutic and diagnostic purposes EXAM: US THORACENTESIS ASP PLEURAL SPACE W/IMG GUIDE COMPARISON:  Chest radiograph - 02/18/2016; chest CT - 02/20/2026 MEDICATIONS: None. COMPLICATIONS: None immediate. TECHNIQUE: Informed written consent was obtained from the patient after a discussion of the risks, benefits and alternatives to  treatment. A timeout was performed prior to the initiation of the procedure. Initial ultrasound scanning demonstrates a small to moderate-sized left-sided pleural effusion. The lower chest was prepped and draped in the usual sterile fashion. 1% lidocaine was used for local anesthesia. An ultrasound image was saved for documentation purposes. An 8 Fr Safe-T-Centesis catheter was introduced. The thoracentesis was performed. The catheter was removed and a dressing was applied. The patient tolerated the procedure well without immediate post procedural complication. The patient was escorted to have an upright chest radiograph. FINDINGS: A total of approximately 550 cc of serous fluid was removed. Requested samples were sent to the laboratory. IMPRESSION: Successful ultrasound-guided left sided thoracentesis yielding 550 cc of pleural fluid. Electronically Signed   By: Simonne ComeJohn  Watts M.D.   On: 02/21/2016 14:54     PREVIOUS ENDOSCOPIES:              Thanks   LOS: 4 days   Lacey JensenSteven Judithann Villamar  MD  @  02/22/2016, 12:05 PM

## 2016-02-22 NOTE — Care Management Important Message (Signed)
Important Message  Patient Details  Name: Alyssa Landry MRN: [id number] Date of Birth: September 22, 1918   Medicare Important Message Given:  Yes    Aisling Emigh A, RN 02/22/2016, 4:14 PM

## 2016-02-22 NOTE — Progress Notes (Signed)
Pharmacy Antibiotic Note  Alyssa Landry is a 82 y.o. female admitted on 02/18/2016 Pharmacy has been consulted for vancomycin dosing for UTI. Per Care Everywhere, pt with MRSA and enterococcus UTI sensitive to vancomycin from 8/3.   Patient initially on vancomycin 8/9-8/10, discontinued Also on levofloxacin for PNA/AECOPD  Plan: Vancomycin 1 g IV x1 then 500 mg IV q24h. Target trough: 10-15 mcg/ml  Will need to follow renal function closely in this elderly female. Repeat serum creatinine tomorrow AM.   Vanc trough 8/16 prior to 4th dose, will not be quite steady state.   Ke 0.029, half life 24 h, Vd 37.8 L  Height: 4\' 9"  (144.8 cm) Weight: 118 lb 14.4 oz (53.9 kg) IBW/kg (Calculated) : 38.6  Temp (24hrs), Avg:98 F (36.7 C), Min:97.4 F (36.3 C), Max:98.7 F (37.1 C)   Recent Labs Lab 02/18/16 1946 02/19/16 0253 02/20/16 0457 02/21/16 0507  WBC 13.1* 11.0  --   --   CREATININE 0.34* 0.42* 0.52 0.54    Estimated Creatinine Clearance: 29.7 mL/min (by C-G formula based on SCr of 0.8 mg/dL).    Antimicrobials this admission: vanc 8/9 > 8/10 Levofloxacin 8/9 >>  Dose adjustments this admission:   Microbiology results: 8/3 Urine - MRSA/enterococcus: ** S = Susceptible; I = Intermediate; R = Resistant **  P = Positive; N = Negative MICS are expressed in micrograms per mL  Antibiotic RSLT#[id number]#[id number]#3RSLT#4 CiprofloxacinR R Gentamicin S Levofloxacin R R LinezolidS Nitrofurantoin S S OxacillinR Penicillin R S Rifampin S Tetracycline S R Trimethoprim/Sulfa S Vancomycin S SComment Comment:  ** S = Susceptible; I = Intermediate; R = Resistant  **  Thank you for allowing pharmacy to be a part of this patient's care.  Ilissa Rosner C 02/22/2016 11:06 AM

## 2016-02-22 NOTE — Evaluation (Signed)
Physical Therapy Evaluation Patient Details Name: Alyssa Landry MRN: [id number] DOB: [birth year] Today's Date: 02/22/2016   History of Present Illness  82 yo female admitted for acute on chronic respiratory failure with complaints of SOB, had L thoracentesis and on vancomycin for UTI.  Began using regular O2 at 2L in July 2017.   PMH includes COPD, HTN, and a-fib.  Clinical Impression  Pt is up to sit with PT and maintains O2 sats with cannula 2L but very tired.  Her plan is to ask for SNF care to increase safety and independence to get back home with family to care for her.  Has a daughter who is not working staying with her at all times, but is not able to do enough independently to be assured she will not deteriorate in that situation.  Follow acutely until DC for standing and stepping as pt is able to tolerate.    Follow Up Recommendations SNF    Equipment Recommendations  None recommended by PT    Recommendations for Other Services Rehab consult     Precautions / Restrictions Precautions Precautions: Fall (telemetry) Restrictions Weight Bearing Restrictions: No      Mobility  Bed Mobility Overal bed mobility: Needs Assistance Bed Mobility: Supine to Sit;Sit to Supine     Supine to sit: Mod assist Sit to supine: Mod assist      Transfers                 General transfer comment: Pt states she isn't up to trying this  Ambulation/Gait             General Gait Details: declined but O2 sats 100% in supine and sitting  Stairs            Wheelchair Mobility    Modified Rankin (Stroke Patients Only)       Balance     Sitting balance-Leahy Scale: Poor                                       Pertinent Vitals/Pain Pain Assessment: No/denies pain    Home Living Family/patient expects to be discharged to:: Private residence Living Arrangements: Children Available Help at Discharge: Family Type of Home: House Home Access:  Stairs to enter Entrance Stairs-Rails: None Entrance Stairs-Number of Steps: 11 (pt states 8) Home Layout: Two level Home Equipment: Walker - 2 wheels;Cane - single point      Prior Function Level of Independence: Independent with assistive device(s)         Comments: has SPC and 4 wheeled walker, not using them recently     Hand Dominance        Extremity/Trunk Assessment   Upper Extremity Assessment: Generalized weakness           Lower Extremity Assessment: Generalized weakness      Cervical / Trunk Assessment: Kyphotic  Communication   Communication: No difficulties  Cognition Arousal/Alertness: Lethargic Behavior During Therapy: Flat affect Overall Cognitive Status: No family/caregiver present to determine baseline cognitive functioning       Memory: Decreased short-term memory              General Comments      Exercises        Assessment/Plan    PT Assessment Patient needs continued PT services  PT Diagnosis Difficulty walking;Generalized weakness   PT Problem List Decreased strength;Decreased range of motion;Decreased  activity tolerance;Decreased balance;Decreased mobility;Decreased coordination;Decreased cognition;Decreased knowledge of use of DME;Decreased safety awareness;Decreased knowledge of precautions;Cardiopulmonary status limiting activity;Decreased skin integrity  PT Treatment Interventions DME instruction;Stair training;Gait training;Functional mobility training;Therapeutic activities;Therapeutic exercise;Balance training;Neuromuscular re-education;Patient/family education   PT Goals (Current goals can be found in the Care Plan section) Acute Rehab PT Goals Patient Stated Goal: to get home PT Goal Formulation: With patient Time For Goal Achievement: 03/07/16 Potential to Achieve Goals: Good    Frequency Min 2X/week   Barriers to discharge Inaccessible home environment flight of steps to negotiate entrance to house     Co-evaluation               End of Session Equipment Utilized During Treatment: Oxygen Activity Tolerance: Patient limited by fatigue;Patient limited by lethargy Patient left: in bed;with call bell/phone within reach;with bed alarm set Nurse Communication: Mobility status         Time: 1610-9604 PT Time Calculation (min) (ACUTE ONLY): 23 min   Charges:   PT Evaluation $PT Eval Moderate Complexity: 1 Procedure PT Treatments $Therapeutic Activity: 8-22 mins   PT G Codes:        Ivar Drape 03/05/16, 1:48 PM  Samul Dada, PT MS Acute Rehab Dept. Number: [facility] [id number] and [facility] [phone]

## 2016-02-23 LAB — CREATININE, SERUM
Creatinine, Ser: 0.55 mg/dL (ref 0.44–1.00)
GFR calc Af Amer: >60 mL/min (ref >60)
GFR calc non Af Amer: >60 mL/min (ref >60)

## 2016-02-23 LAB — BASIC METABOLIC PANEL
Anion gap: 8 (ref 5–15)
BUN: 19 mg/dL (ref 6–20)
CHLORIDE: 76 mmol/L — AB (ref 101–111)
CO2: 44 mmol/L — AB (ref 22–32)
CREATININE: 0.63 mg/dL (ref 0.44–1.00)
Calcium: 8.6 mg/dL — ABNORMAL LOW (ref 8.9–10.3)
GFR calc Af Amer: >60 mL/min (ref >60)
GFR calc non Af Amer: >60 mL/min (ref >60)
GLUCOSE: 164 mg/dL — AB (ref 65–99)
POTASSIUM: 4.6 mmol/L (ref 3.5–5.1)
Sodium: 128 mmol/L — ABNORMAL LOW (ref 135–145)

## 2016-02-23 MED ORDER — METHYLPREDNISOLONE SODIUM SUCC 125 MG IJ SOLR
60.0000 mg | Freq: Every day | INTRAMUSCULAR | Status: DC
  Administered 2016-02-24 – 2016-02-25 (×2): 60 mg via INTRAVENOUS
  Filled 2016-02-23 (×2): qty 2

## 2016-02-23 NOTE — Clinical Social Work Note (Signed)
Clinical Social Work Assessment  Patient Details  Name: Alyssa Landry MRN: [id number] Date of Birth: 06-19-1919  Date of referral:  02/23/16               Reason for consult:  Facility Placement                Permission sought to share information with:  Facility Medical sales representativeContact Representative, Family Supports Permission granted to share information::  Yes, Verbal Permission Granted  Name::        Agency::     Relationship::     Contact Information:     Housing/Transportation Living arrangements for the past 2 months:  Single Family Home Source of Information:  Patient, Adult Children (Daughter: [name]: [phone]) Patient Interpreter Needed:  None Criminal Activity/Legal Involvement Pertinent to Current Situation/Hospitalization:  No - Comment as needed Significant Relationships:  Adult Children Lives with:  Adult Children Do you feel safe going back to the place where you live?  Yes Need for family participation in patient care:  Yes (Comment)  Care giving concerns:  Patient requires assistance with ADL's.   Social Worker assessment / plan:  CSW spoke with patient and her daughter this morning regarding PT recommendations for STR. Patient and daughter are both agreeable for STR and prefer [facility] due to patient having been there in the past and daughter living in [place]. Patient lives with her daughter and typically ambulates with a cane or a walker. Bedsearch initiated.  Employment status:  Retired Health and safety inspectornsurance information:  Medicare PT Recommendations:  Skilled Nursing Facility Information / Referral to community resources:  Skilled Nursing Facility  Patient/Family's Response to care:  Patient and daughter were grateful for CSW visit.   Patient/Family's Understanding of and Emotional Response to Diagnosis, Current Treatment, and Prognosis:  Patient was appropriate during assessment but was noticeably in discomfort. Daughter and patient confirmed that patient's  nurse was aware.   Emotional Assessment Appearance:  Appears younger than stated age Attitude/Demeanor/Rapport:   (pleasant and cooperative) Affect (typically observed):  Accepting, Appropriate, Calm, Stable Orientation:  Oriented to Self, Oriented to Place, Oriented to  Time, Oriented to Situation Alcohol / Substance use:  Not Applicable Psych involvement (Current and /or in the community):  No (Comment)  Discharge Needs  Concerns to be addressed:  Care Coordination Readmission within the last 30 days:  No Current discharge risk:  None Barriers to Discharge:  No Barriers Identified   York SpanielMonica Ashante Yellin, LCSW 02/23/2016, 10:16 AM

## 2016-02-23 NOTE — Consult Note (Signed)
GI Inpatient Follow-up Note  Patient Identification: Alyssa Landry is a 82 y.o. female with constipation. Received magnesium citrate and miralax yesterday. Patient and daughter report 2 large bowel movements yesterday.    Subjective:  Scheduled Inpatient Medications:  . albuterol  2.5 mg Nebulization TID  . aspirin EC  81 mg Oral QPM  . budesonide (PULMICORT) nebulizer solution  0.25 mg Nebulization BID  . cholecalciferol  1,000 Units Oral Daily  . cloNIDine  0.1 mg Oral TID  . clopidogrel  75 mg Oral Daily  . diltiazem  120 mg Oral Daily  . diltiazem  30 mg Oral Once  . enoxaparin (LOVENOX) injection  30 mg Subcutaneous Q24H  . famotidine  20 mg Oral Daily  . feeding supplement (ENSURE ENLIVE)  237 mL Oral Daily  . ferrous sulfate  325 mg Oral BID WC  . fluticasone  2 spray Each Nare BH-q7a  . furosemide  20 mg Intravenous Daily  . guaiFENesin  600 mg Oral BID  . levofloxacin  250 mg Oral Daily  . levothyroxine  100 mcg Oral QAC breakfast  . LORazepam  0.5 mg Oral TID  . losartan  50 mg Oral BID  . methylPREDNISolone (SOLU-MEDROL) injection  60 mg Intravenous Q12H  . multivitamin with minerals  1 tablet Oral Daily  . oxybutynin  5 mg Oral QHS  . polyethylene glycol  17 g Oral Daily  . polyethylene glycol  17 g Oral Daily  . sodium bicarbonate  325 mg Oral QID  . sodium chloride flush  3 mL Intravenous Q12H  . tiotropium  18 mcg Inhalation Daily  . vancomycin  500 mg Intravenous Q24H  . vitamin B-12  1,000 mcg Oral Daily    Continuous Inpatient Infusions:     PRN Inpatient Medications:  guaiFENesin-dextromethorphan, ipratropium-albuterol, morphine injection, ondansetron **OR** ondansetron (ZOFRAN) IV  Review of Systems:  Constitutional: Weight is stable.  Eyes: No changes in vision. ENT: No oral lesions, sore throat.  GI: see HPI.  Heme/Lymph: No easy bruising.  CV: No chest pain.  GU: No hematuria.  Integumentary: No rashes.  Neuro: No headaches.  Psych: No  depression/anxiety.  Endocrine: No heat/cold intolerance.  Allergic/Immunologic: No urticaria.  Resp: No cough, SOB.  Musculoskeletal: No joint swelling.    Physical Examination: BP (!) 141/48 (BP Location: Left Arm)   Pulse (!) 59   Temp 99.3 F (37.4 C) (Oral)   Resp 14   Ht  (1.448 m)   Wt 53.1 kg (117 lb 1.6 oz)   SpO2 97%   BMI 25.34 kg/m  Gen: NAD, alert and oriented x 4 HEENT: PEERLA, EOMI, Neck: supple, no JVD or thyromegaly Chest: CTA bilaterally, no wheezes, crackles, or other adventitious sounds CV: RRR, no m/g/c/r Abd: soft, NT, ND, +BS in all four quadrants; no HSM, guarding, ridigity, or rebound tenderness Ext: no edema Skin: no rash or lesions noted   Data: Lab Results  Component Value Date   WBC 11.0 02/19/2016   HGB 10.9 (L) 02/19/2016   HCT 30.2 (L) 02/19/2016   MCV 83.6 02/19/2016   PLT 346 02/19/2016    Recent Labs Lab 02/18/16 1946 02/19/16 0253  HGB 10.6* 10.9*   Lab Results  Component Value Date   NA 128 (L) 02/23/2016   K 4.6 02/23/2016   CL 76 (L) 02/23/2016   CO2 44 (H) 02/23/2016   BUN 19 02/23/2016   CREATININE 0.63 02/23/2016   Lab Results  Component Value Date  ALT 16 02/19/2016   AST 19 02/19/2016   ALKPHOS 109 02/19/2016   BILITOT 0.5 02/19/2016   No results for input(s): APTT, INR, PTT in the last 168 hours.  Assessment/Plan: Alyssa Landry is a 82 y.o. female with constipation.   Recommendations: Continue Miralax 1-2 tbsp a day to help with constipation.   Please call with questions or concerns.  Lacey JensenSteven Cinthia Rodden, MD

## 2016-02-23 NOTE — NC FL2 (Signed)
[place] MEDICAID FL2 LEVEL OF CARE SCREENING TOOL     IDENTIFICATION  Patient Name: Alyssa Landry Birthdate: 01-24-1919 Sex: female Admission Date (Current Location): 02/18/2016  [place] and [place] Number:  Chiropodist and Address:  [facility], [address] [place], [place] [address]      Provider Number: [id number]  Attending Physician Name and Address:  Houston Siren, MD  Relative Name and Phone Number:       Current Level of Care: Hospital Recommended Level of Care: Skilled Nursing Facility Prior Approval Number:    Date Approved/Denied:   PASRR Number: [id number]  Discharge Plan: SNF    Current Diagnoses: Patient Active Problem List   Diagnosis Date Noted  . Hypoxia   . SOB (shortness of breath)   . Collapse of left lung   . Pleural effusion   . Acute respiratory distress (HCC)   . Centrilobular emphysema (HCC)   . Hyponatremia   . Pressure ulcer 02/19/2016  . Pulmonary edema 02/18/2016  . Acute on chronic respiratory failure with hypoxia (HCC) 01/26/2016  . Atrial fibrillation with RVR (HCC) 12/11/2015  . Urge incontinence 09/26/2015  . Lichenified rash 09/26/2015  . Syncopal episodes 09/25/2015    Orientation RESPIRATION BLADDER Height & Weight     Self, Time, Situation, Place  Normal, O2 (2 liters) Incontinent Weight: 117 lb 1.6 oz (53.1 kg) Height:  4\' 9"  (144.8 cm)  BEHAVIORAL SYMPTOMS/MOOD NEUROLOGICAL BOWEL NUTRITION STATUS   (none)  (none) Incontinent Diet (dysphagia 3)  AMBULATORY STATUS COMMUNICATION OF NEEDS Skin   Extensive Assist Verbally Normal                       Personal Care Assistance Level of Assistance  Bathing, Dressing Bathing Assistance: Limited assistance   Dressing Assistance: Limited assistance     Functional Limitations Info  Sight Sight Info: Impaired        SPECIAL CARE FACTORS FREQUENCY  PT (By licensed PT)                    Contractures  Contractures Info: Not present    Additional Factors Info  Code Status Code Status Info: DNR             Current Medications (02/23/2016):  This is the current hospital active medication list Current Facility-Administered Medications  Medication Dose Route Frequency Provider Last Rate Last Dose  . albuterol (PROVENTIL) (2.5 MG/3ML) 0.083% nebulizer solution 2.5 mg  2.5 mg Nebulization TID Stephanie Acre, MD   2.5 mg at 02/23/16 0749  . aspirin EC tablet 81 mg  81 mg Oral QPM Alexis Hugelmeyer, DO   81 mg at 02/22/16 1806  . budesonide (PULMICORT) nebulizer solution 0.25 mg  0.25 mg Nebulization BID Alexis Hugelmeyer, DO   0.25 mg at 02/23/16 0749  . cholecalciferol (VITAMIN D) tablet 1,000 Units  1,000 Units Oral Daily Alexis Hugelmeyer, DO   1,000 Units at 02/22/16 1026  . cloNIDine (CATAPRES) tablet 0.1 mg  0.1 mg Oral TID Alexis Hugelmeyer, DO   0.1 mg at 02/22/16 2229  . clopidogrel (PLAVIX) tablet 75 mg  75 mg Oral Daily Alexis Hugelmeyer, DO   75 mg at 02/22/16 1024  . diltiazem (CARDIZEM CD) 24 hr capsule 120 mg  120 mg Oral Daily Alexis Hugelmeyer, DO   120 mg at 02/22/16 1027  . diltiazem (CARDIZEM) tablet 30 mg  30 mg Oral Once [employer], DO  Stopped at 02/19/16 0604  . enoxaparin (LOVENOX) injection 30 mg  30 mg Subcutaneous Q24H Houston Siren, MD   30 mg at 02/22/16 1523  . famotidine (PEPCID) tablet 20 mg  20 mg Oral Daily Katharina Caper, MD   20 mg at 02/22/16 1024  . feeding supplement (ENSURE ENLIVE) (ENSURE ENLIVE) liquid 237 mL  237 mL Oral Daily Alexis Hugelmeyer, DO   237 mL at 02/22/16 1038  . ferrous sulfate tablet 325 mg  325 mg Oral BID WC Alexis Hugelmeyer, DO   325 mg at 02/22/16 1806  . fluticasone (FLONASE) 50 MCG/ACT nasal spray 2 spray  2 spray Each Nare BH-q7a Alexis Hugelmeyer, DO   2 spray at 02/23/16 [phone]  . furosemide (LASIX) injection 20 mg  20 mg Intravenous Daily Katharina Caper, MD   20 mg at 02/22/16 1025  . guaiFENesin (MUCINEX) 12 hr tablet  600 mg  600 mg Oral BID Katharina Caper, MD   600 mg at 02/22/16 2229  . guaiFENesin-dextromethorphan (ROBITUSSIN DM) 100-10 MG/5ML syrup 10 mL  10 mL Oral Q4H PRN Katharina Caper, MD      . ipratropium-albuterol (DUONEB) 0.5-2.5 (3) MG/3ML nebulizer solution 3 mL  3 mL Nebulization Q4H PRN Alexis Hugelmeyer, DO   3 mL at 02/21/16 0852  . levofloxacin (LEVAQUIN) tablet 250 mg  250 mg Oral Daily Ihor Austin, MD   250 mg at 02/22/16 1027  . levothyroxine (SYNTHROID, LEVOTHROID) tablet 100 mcg  100 mcg Oral QAC breakfast Alexis Hugelmeyer, DO   100 mcg at 02/22/16 1026  . LORazepam (ATIVAN) tablet 0.5 mg  0.5 mg Oral TID Alexis Hugelmeyer, DO   0.5 mg at 02/22/16 2228  . losartan (COZAAR) tablet 50 mg  50 mg Oral BID Katharina Caper, MD   50 mg at 02/22/16 2228  . methylPREDNISolone sodium succinate (SOLU-MEDROL) 125 mg/2 mL injection 60 mg  60 mg Intravenous Q12H Katharina Caper, MD   60 mg at 02/22/16 2227  . morphine 2 MG/ML injection 1 mg  1 mg Intravenous QID PRN Ihor Austin, MD   1 mg at 02/19/16 1840  . multivitamin with minerals tablet 1 tablet  1 tablet Oral Daily Alexis Hugelmeyer, DO   1 tablet at 02/22/16 1025  . ondansetron (ZOFRAN) tablet 4 mg  4 mg Oral Q6H PRN Alexis Hugelmeyer, DO       Or  . ondansetron (ZOFRAN) injection 4 mg  4 mg Intravenous Q6H PRN Alexis Hugelmeyer, DO      . oxybutynin (DITROPAN-XL) 24 hr tablet 5 mg  5 mg Oral QHS Alexis Hugelmeyer, DO   5 mg at 02/22/16 2228  . polyethylene glycol (MIRALAX / GLYCOLAX) packet 17 g  17 g Oral Daily Alexis Hugelmeyer, DO   17 g at 02/22/16 1038  . polyethylene glycol (MIRALAX / GLYCOLAX) packet 17 g  17 g Oral Daily Katharina Caper, MD   17 g at 02/22/16 1026  . sodium bicarbonate tablet 325 mg  325 mg Oral QID Alexis Hugelmeyer, DO   325 mg at 02/22/16 2227  . sodium chloride flush (NS) 0.9 % injection 3 mL  3 mL Intravenous Q12H Alexis Hugelmeyer, DO   3 mL at 02/22/16 2229  . tiotropium [facility]) inhalation capsule 18 mcg  18 mcg  Inhalation Daily Alexis Hugelmeyer, DO   18 mcg at 02/22/16 1344  . vancomycin (VANCOCIN) 500 mg in sodium chloride 0.9 % 100 mL IVPB  500 mg Intravenous Q24H Houston Siren, MD   500  mg at 02/23/16 [phone]  . vitamin B-12 (CYANOCOBALAMIN) tablet 1,000 mcg  1,000 mcg Oral Daily Alexis Hugelmeyer, DO   1,000 mcg at 02/22/16 1026     Discharge Medications: Please see discharge summary for a list of discharge medications.  Relevant Imaging Results:  Relevant Lab Results:   Additional Information SS: [id number] a  York SpanielMonica Nichoals Heyde, LCSW

## 2016-02-23 NOTE — Progress Notes (Signed)
[employer] [facility] [place] [facility]   PATIENT NAME: Alyssa Landry    MR#:  [id number]  DATE OF BIRTH:  06/30/1919  SUBJECTIVE:   Feels better, appetite improved.  Sodium improving.  Afebrile.    REVIEW OF SYSTEMS:    Review of Systems  Constitutional: Negative for chills and fever.  HENT: Negative for congestion and tinnitus.   Eyes: Negative for blurred vision and double vision.  Respiratory: Positive for shortness of breath (improved). Negative for cough and wheezing.   Cardiovascular: Negative for chest pain, orthopnea and PND.  Gastrointestinal: Negative for abdominal pain, diarrhea, nausea and vomiting.  Genitourinary: Negative for dysuria and hematuria.  Neurological: Positive for weakness (generalized). Negative for dizziness, sensory change and focal weakness.  All other systems reviewed and are negative.   Nutrition: Dysphagia 1 Tolerating Diet: Yes Tolerating PT: Eval noted.   DRUG ALLERGIES:   Allergies  Allergen Reactions  . Codeine Other (See Comments)    Reaction:  Unknown   . Ditropan [Oxybutynin] Other (See Comments)    Reaction:  Unknown   . Hctz [Hydrochlorothiazide] Other (See Comments)    Reaction:  Causes pts sodium/potassium levels to drop significantly   . Metronidazole Other (See Comments)    Reaction:  Unknown   . Propranolol Other (See Comments)    Reaction:  Unknown   . Tavist [Clemastine] Other (See Comments)    Reaction:  Unknown   . Tramadol Other (See Comments)    Reaction:  Unknown   . Vicodin [Hydrocodone-Acetaminophen] Other (See Comments)    Reaction:  Unknown     VITALS:  Blood pressure (!) 141/48, pulse (!) 59, temperature 99.3 F (37.4 C), temperature source Oral, resp. rate 14, height 4\' 9"  (1.448 m), weight 53.1 kg (117 lb 1.6 oz), SpO2 97 %.  PHYSICAL EXAMINATION:   Physical Exam  GENERAL:  82 y.o.-year-old patient lying in the bed in no acute distress.  EYES: Pupils equal, round, reactive to light  and accommodation. No scleral icterus. Extraocular muscles intact.  HEENT: Head atraumatic, normocephalic. Oropharynx and nasopharynx clear.  NECK:  Supple, no jugular venous distention. No thyroid enlargement, no tenderness.  LUNGS: Normal breath sounds bilaterally, no wheezing, rhonchi, bibasilar rales. No use of accessory muscles of respiration.  CARDIOVASCULAR: S1, S2 normal. No murmurs, rubs, or gallops.  ABDOMEN: Soft, nontender, nondistended. Bowel sounds present. No organomegaly or mass.  EXTREMITIES: No cyanosis, clubbing or edema b/l.    NEUROLOGIC: Cranial nerves II through XII are intact. No focal Motor or sensory deficits b/l. Globally weak.  PSYCHIATRIC: The patient is alert and oriented x 3.  SKIN: No obvious rash, lesion, or ulcer.    LABORATORY PANEL:   CBC  Recent Labs Lab 02/19/16 0253  WBC 11.0  HGB 10.9*  HCT 30.2*  PLT 346   ------------------------------------------------------------------------------------------------------------------  Chemistries   Recent Labs Lab 02/19/16 0253  02/23/16 0934  NA 116*  < > 128*  K 3.5  < > 4.6  CL 70*  < > 76*  CO2 36*  < > 44*  GLUCOSE 213*  < > 164*  BUN 5*  < > 19  CREATININE 0.42*  < > 0.63  CALCIUM 8.4*  < > 8.6*  AST 19  --   --   ALT 16  --   --   ALKPHOS 109  --   --   BILITOT 0.5  --   --   < > = values in this interval not displayed. ------------------------------------------------------------------------------------------------------------------  Cardiac Enzymes  Recent Labs Lab 02/19/16 1348  TROPONINI <0.03   ------------------------------------------------------------------------------------------------------------------  RADIOLOGY:  Dg Chest 1 View  Result Date: 02/21/2016 CLINICAL DATA:  Post left-sided thoracentesis. EXAM: CHEST 1 VIEW COMPARISON:  Chest radiograph - 02/18/2016; chest CT - 02/21/2016 FINDINGS: Grossly unchanged cardiac silhouette and mediastinal contours with  atherosclerotic plaque within the thoracic aorta. Interval reduction in persistent small / trace left-sided effusion post thoracentesis. No pneumothorax. Improved aeration of left lower lung with residual left basilar opacities, likely atelectasis. Unchanged small, potentially partially loculated right-sided effusion with associated right basilar heterogeneous opacities. No new focal airspace opacities. Unchanged mild cephalization of flow. Unchanged bones. IMPRESSION: 1. Interval reduction in persistent small/trace left-sided effusion post thoracentesis. No pneumothorax. 2. Unchanged small potentially partially loculated right-sided effusion with associated bibasilar opacities, right greater than left, likely atelectasis. 3. Similar findings of cardiomegaly and pulmonary venous congestion. 4.  Aortic Atherosclerosis (ICD10-170.0) Electronically Signed   By: Simonne Come M.D.   On: 02/21/2016 14:53   Dg Abd 1 View  Result Date: 02/21/2016 CLINICAL DATA:  Constipation. EXAM: ABDOMEN - 1 VIEW COMPARISON:  02/19/2016 FINDINGS: There is moderate stool throughout nondilated loops of colon with some improvement compared with prior study. No evidence for bowel obstruction. No free intraperitoneal air. Degenerative changes are seen in the spine. IMPRESSION: Persistent significant stool within the colon. There has been some improvement in stool burden compared to prior study. Nonobstructive bowel gas pattern. Electronically Signed   By: Norva Pavlov M.D.   On: 02/21/2016 12:34   US Thoracentesis Asp Pleural Space W/img Guide  Result Date: 02/21/2016 INDICATION: Symptomatic left sided pleural effusion - please perform ultrasound-guided thoracentesis for therapeutic and diagnostic purposes EXAM: US THORACENTESIS ASP PLEURAL SPACE W/IMG GUIDE COMPARISON:  Chest radiograph - 02/18/2016; chest CT - 02/20/2026 MEDICATIONS: None. COMPLICATIONS: None immediate. TECHNIQUE: Informed written consent was obtained from the  patient after a discussion of the risks, benefits and alternatives to treatment. A timeout was performed prior to the initiation of the procedure. Initial ultrasound scanning demonstrates a small to moderate-sized left-sided pleural effusion. The lower chest was prepped and draped in the usual sterile fashion. 1% lidocaine was used for local anesthesia. An ultrasound image was saved for documentation purposes. An 8 Fr Safe-T-Centesis catheter was introduced. The thoracentesis was performed. The catheter was removed and a dressing was applied. The patient tolerated the procedure well without immediate post procedural complication. The patient was escorted to have an upright chest radiograph. FINDINGS: A total of approximately 550 cc of serous fluid was removed. Requested samples were sent to the laboratory. IMPRESSION: Successful ultrasound-guided left sided thoracentesis yielding 550 cc of pleural fluid. Electronically Signed   By: Simonne Come M.D.   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  1. Acute on Chronic respiratory failure with hypoxia-secondary to CHF, COPD. -Status post ultrasound-guided thoracentesis on 02/21/16 with 550 cc of fluid removed. Fluid is transudative in nature. -Continue treatment for COPD with DuoNeb's, IV steroids, empiric abx with Vanco, Levaquin.  Improving.  -Wean O2 as tolerated.  2. COPD exacerbation-continue IV steroids but will taper further. Continue scheduled DuoNeb's, Spiriva. -Continue Pulmicort nebs. Clinically improving.  3. CHF-acute on chronic diastolic dysfunction. -Continue diuresis with IV Lasix, about 3 L (-) since admission, continue Cardizem  4. Hyponatremia-chronic in nature and secondary to  SIADH. -Sodium improving, continue fluid restriction, salt tabs.  5. Essential hypertension-continue clonidine, Cardizem.  6. GERD-continue Pepcid.  7. Urinary incontinence - continue oxybutynin.  8. Urinary tract infection-patient has urine cultures positive from 02/13/2016 for enterococcus and MRSA. - cont. Vancomycin.    9. Constipation - much improved.  Had a large BM with Mg. Citrate yesterday.  - cont. Miralax PRN.  Appreciate GI input.   PT eval noted and pt. Will need STR and notified family at bedside.  Notified Child psychotherapist.   All the records are reviewed and case discussed with Care Management/Social Workerr. Management plans discussed with the patient, family and they are in agreement.  CODE STATUS: DO NOT RESUSCITATE  DVT Prophylaxis: Lovenox  TOTAL TIME TAKING CARE OF THIS PATIENT: 30 minutes.   POSSIBLE D/C IN 1-2 DAYS, DEPENDING ON CLINICAL CONDITION.   Houston Siren M.D on 02/23/2016 at 12:20 PM  Between 7am to 6pm - Pager - [phone]  After 6pm go to [email or web address] - password [email or web address] [facility]  [facility]  Office  [phone]  CC: Primary care physician; Rafael Bihari, MD

## 2016-02-24 LAB — CBC
HEMATOCRIT: 30.7 % — AB (ref 35.0–47.0)
HEMOGLOBIN: 10.6 g/dL — AB (ref 12.0–16.0)
MCH: 29.9 pg (ref 26.0–34.0)
MCHC: 34.6 g/dL (ref 32.0–36.0)
MCV: 86.5 fL (ref 80.0–100.0)
Platelets: 493 10*3/uL — ABNORMAL HIGH (ref 150–440)
RBC: 3.55 MIL/uL — AB (ref 3.80–5.20)
RDW: 14.5 % (ref 11.5–14.5)
WBC: 12.6 10*3/uL — ABNORMAL HIGH (ref 3.6–11.0)

## 2016-02-24 LAB — CYTOLOGY - NON PAP

## 2016-02-24 LAB — SODIUM: SODIUM: 128 mmol/L — AB (ref 135–145)

## 2016-02-24 NOTE — Consult Note (Signed)
PULMONARY / CRITICAL CARE MEDICINE   Name: Alyssa Landry MRN: [id number] DOB: Mar 26, 1919   Referring Physician - Dr. Winona LegatoVaickute CC: "short of breath" Reason for consult - pleural effusion   ADMISSION DATE:  02/18/2016  HISTORY: 82 year old female past medical history of atrial fibrillation, GERD, COPD, lipidemia, hypertension, pulmonary fibrosis, SIADH with chronic hyponatremia, presented on 89 with complaints of shortness of breath. This treated per daughter who is in the room, she is also healthcare power of attorney. Stated patient started having some shortness of breath Thursday prior to admission, saw her PMD who stated that they couldn't auscultate much breath sounds on the left side, had a urine culture done that showed positive MRSA in the urine and was sent to the hospital for further evaluation. During the hospitalization her chest x-ray revealed increase in basilar opacities and left-sided greater than right-sided pleural effusion, along with a sodium level on admission of 116. She still with cough, no fever, no sputum production. S/p thoracentesis   Subjective Resting comfortably, poor appetite, NAD, family and friends at bedside On minimal oxygen  STUDIES:  CT chest 02/21/16-bilateral pleural effusion, left greater than right, peripheral groundglass opacity, consolidation of the left lower lobe and possible collapse   US guided Thoracentesis 8/11 550 cc's serous fludi removed c/w transudative process  SIGNIFICANT EVENTS:   VITAL SIGNS: Temp:  [97.6 F (36.4 C)-97.8 F (36.6 C)] 97.6 F (36.4 C) (08/14 0535) Pulse Rate:  [63-82] 77 (08/14 1030) Resp:  [16-20] 16 (08/14 0535) BP: (114-153)/(37-60) 153/60 (08/14 1030) SpO2:  [96 %-100 %] 96 % (08/14 1030) Weight:  [117 lb 12.8 oz (53.4 kg)] 117 lb 12.8 oz (53.4 kg) (08/14 0620) HEMODYNAMICS:     Intake/Output Summary (Last 24 hours) at 02/24/16 1137 Last data filed at 02/24/16 1100  Gross per 24 hour  Intake                 0 ml  Output              300 ml  Net             -300 ml    Review of Systems  Constitutional: Negative for chills and fever.  Eyes: Negative for blurred vision and double vision.  Respiratory: Positive for shortness of breath and wheezing. Negative for cough and sputum production.   Cardiovascular: Negative for chest pain.  Gastrointestinal: Negative for blood in stool, constipation, diarrhea, heartburn and nausea.  Genitourinary: Negative for dysuria and frequency.  Musculoskeletal: Negative for myalgias.  Skin: Negative for rash.  Neurological: Negative for dizziness and headaches.  Endo/Heme/Allergies: Does not bruise/bleed easily.    Physical Exam  Nursing note and vitals reviewed.  GENERAL:  mild respiratory distress, some tachypneic, especially on exertion and with speech, but overall less uncomfortable.  EYES: Pupils equal, round, reactive to light and accommodation. No scleral icterus. Extraocular muscles intact.  HEENT: Head atraumatic, normocephalic. Oropharynx and nasopharynx clear.  NECK:  Supple, no jugular venous distention. No thyroid enlargement, no tenderness.  LUNGS: dec bs on right, absent BS on the left,  CARDIOVASCULAR: S1, S2 normal. No murmurs, rubs, or gallops.  ABDOMEN: Soft, nontender, nondistended. Bowel sounds present. No organomegaly or mass.  EXTREMITIES: 1+ lower extremity and pedal edema, no cyanosis, or clubbing.  NEUROLOGIC: Cranial nerves II through XII are intact. Muscle strength 5/5 in all extremities. Sensation intact. Gait not checked.  PSYCHIATRIC: The patient is alert today, able to interact, answerquestions  SKIN: No obvious rash,  lesion, or ulcer.   LABS:  CBC  Recent Labs Lab 02/18/16 1946 02/19/16 0253 02/24/16 0408  WBC 13.1* 11.0 12.6*  HGB 10.6* 10.9* 10.6*  HCT 29.9* 30.2* 30.7*  PLT 394 346 493*   Coag's No results for input(s): APTT, INR in the last 168 hours. BMET  Recent Labs Lab 02/20/16 0457  02/21/16 0507 02/22/16 0543 02/23/16 0613 02/23/16 0934 02/24/16 0408  NA 117* 119* 124*  --  128* 128*  K 4.6 4.3  --   --  4.6  --   CL 72* 72*  --   --  76*  --   CO2 36* 40*  --   --  44*  --   BUN 12 17  --   --  19  --   CREATININE 0.52 0.54  --  0.55 0.63  --   GLUCOSE 137* 137*  --   --  164*  --    Electrolytes  Recent Labs Lab 02/20/16 0457 02/21/16 0507 02/23/16 0934  CALCIUM 9.2 9.0 8.6*   Sepsis Markers No results for input(s): LATICACIDVEN, PROCALCITON, O2SATVEN in the last 168 hours. ABG No results for input(s): PHART, PCO2ART, PO2ART in the last 168 hours. Liver Enzymes  Recent Labs Lab 02/18/16 1946 02/19/16 0253  AST 20 19  ALT 15 16  ALKPHOS 116 109  BILITOT 0.6 0.5  ALBUMIN 3.5 3.4*   Cardiac Enzymes  Recent Labs Lab 02/19/16 0253 02/19/16 0830 02/19/16 1348  TROPONINI <0.03 <0.03 <0.03   Glucose No results for input(s): GLUCAP in the last 168 hours.  Imaging No results found.  LINES:   CULTURES: Pleural Fluid 8/9>  ANTIBIOTICS Levaquin 8/9>  ASSESSMENT / PLAN: 82 year old female with left lung consolidation, left pleural effusion, respiratory distress.  Acute respiratory failure with hypoxia Bilateral pleural effusion, left greater than right COPD Left lung atelectasis/collapse Leukocytosis Constipation SIADH Chronic hyponatremia  Plan: -s/p Ultrasound guided thoracentesis-transudative process - Do not recommend another thoracentesis if a reaccumulation of fluid occurs, risk for pneumothorax is greatly increase with subsequent thoracenteses. -Continue with Pulmicort nebulizer, will schedule DuoNeb's every 8 hours with X3 days -Pulmonary toilet to include chest percussion, incentive spirometry and flutter valve -CT chest reviewed, pleural fluid is most likely causing atelectasis and collapse of certain areas of the left lung, once fluid is removed I expect left lung to reexpand. -Continue with current  antibiotics -Pulmonary toilet and chest physiotherapy will be the most prominent interventions for clinical improvement in this particular patient, with respect to pulmonary status. - patient to follow up with PMD at discharge.    Thank you for consulting [facility], we will signoff at this time.  Please feel free to contact us with any questions at [phone] (please enter 7-digits).  The Patient requires high complexity decision making for assessment and support, frequent evaluation and titration of therapies.  Patient/Family are satisfied with Plan of action and management. All questions answered  Pulmonary Consult time - 35 mins  Stephanie AcreVishal Joie Hipps, MD [facility] Pager [phone] (please enter 7-digits) On Call Pager - [phone] (please enter 7-digits)

## 2016-02-24 NOTE — Progress Notes (Signed)
PT Cancellation Note  Patient Details Name: Alyssa Landry MRN: [id number] DOB: 1918-10-16   Cancelled Treatment:    Reason Eval/Treat Not Completed: Medical issues which prohibited therapy   Pt in bed with family in room.  Family stated they were going to call nursing as pt was complaining of SOB.  Sats taken 90% on 2 lpm.  Nurse notified.  Session held at this time.   Danielle DessSarah Rahn Lacuesta 02/24/2016, 12:31 PM

## 2016-02-24 NOTE — Clinical Social Work Note (Signed)
Patient has had a bed offer from her facility of choice: [facility] and patient and daughter have accepted this. Physician is aware and potential for discharge tomorrow. York SpanielMonica Renton Berkley MSW,LCSW [phone]

## 2016-02-24 NOTE — Progress Notes (Signed)
[employer] [facility] - [place] [facility]   PATIENT NAME: Alyssa Landry    MR#:  [id number]  DATE OF BIRTH:  11/24/1918  SUBJECTIVE:   Sodium stable. Appetite improved.  Family at bedside.  Shortness of breath improving.    REVIEW OF SYSTEMS:    Review of Systems  Constitutional: Negative for chills and fever.  HENT: Negative for congestion and tinnitus.   Eyes: Negative for blurred vision and double vision.  Respiratory: Positive for shortness of breath (improved). Negative for cough and wheezing.   Cardiovascular: Negative for chest pain, orthopnea and PND.  Gastrointestinal: Negative for abdominal pain, diarrhea, nausea and vomiting.  Genitourinary: Negative for dysuria and hematuria.  Neurological: Positive for weakness (generalized). Negative for dizziness, sensory change and focal weakness.  All other systems reviewed and are negative.   Nutrition: Dysphagia 1 Tolerating Diet: Yes Tolerating PT: Eval noted.   DRUG ALLERGIES:   Allergies  Allergen Reactions  . Aspirin Other (See Comments)    Upset stomach with high doses  . Codeine Other (See Comments)    Reaction:  Unknown   . Ditropan [Oxybutynin] Other (See Comments)    Reaction:  Unknown   . Hctz [Hydrochlorothiazide] Other (See Comments)    Reaction:  Causes pts sodium/potassium levels to drop significantly   . Metronidazole Other (See Comments)    Reaction:  Unknown   . Propranolol Other (See Comments)    Reaction:  Unknown   . Tavist [Clemastine] Other (See Comments)    Reaction:  Unknown   . Tramadol Other (See Comments)    Reaction:  Unknown   . Vicodin [Hydrocodone-Acetaminophen] Other (See Comments)    Reaction:  Unknown     VITALS:  Blood pressure 133/63, pulse 93, temperature 97.5 F (36.4 C), temperature source Oral, resp. rate 19, height 4\' 9"  (1.448 m), weight 53.4 kg (117 lb 12.8 oz), SpO2 98 %.  PHYSICAL EXAMINATION:   Physical Exam  GENERAL:  82 y.o.-year-old patient  lying in the bed in no acute distress.  EYES: Pupils equal, round, reactive to light and accommodation. No scleral icterus. Extraocular muscles intact.  HEENT: Head atraumatic, normocephalic. Oropharynx and nasopharynx clear.  NECK:  Supple, no jugular venous distention. No thyroid enlargement, no tenderness.  LUNGS: Poor resp. effort, no wheezing, rhonchi, bibasilar rales. No use of accessory muscles of respiration.  CARDIOVASCULAR: S1, S2 normal. No murmurs, rubs, or gallops.  ABDOMEN: Soft, nontender, nondistended. Bowel sounds present. No organomegaly or mass.  EXTREMITIES: No cyanosis, clubbing or edema b/l.    NEUROLOGIC: Cranial nerves II through XII are intact. No focal Motor or sensory deficits b/l. Globally weak.  PSYCHIATRIC: The patient is alert and oriented x 3.  SKIN: No obvious rash, lesion, or ulcer.    LABORATORY PANEL:   CBC  Recent Labs Lab 02/24/16 0408  WBC 12.6*  HGB 10.6*  HCT 30.7*  PLT 493*   ------------------------------------------------------------------------------------------------------------------  Chemistries   Recent Labs Lab 02/19/16 0253  02/23/16 0934 02/24/16 0408  NA 116*  < > 128* 128*  K 3.5  < > 4.6  --   CL 70*  < > 76*  --   CO2 36*  < > 44*  --   GLUCOSE 213*  < > 164*  --   BUN 5*  < > 19  --   CREATININE 0.42*  < > 0.63  --   CALCIUM 8.4*  < > 8.6*  --   AST 19  --   --   --  ALT 16  --   --   --   ALKPHOS 109  --   --   --   BILITOT 0.5  --   --   --   < > = values in this interval not displayed. ------------------------------------------------------------------------------------------------------------------  Cardiac Enzymes  Recent Labs Lab 02/19/16 1348  TROPONINI <0.03   ------------------------------------------------------------------------------------------------------------------  RADIOLOGY:  No results found.   ASSESSMENT AND PLAN:   82 year old female with past medical history of hypertension,  hyperlipidemia, hypothyroidism, osteoporosis, history of pulmonary fibrosis, chronic hyponatremia secondary to SIADH, GERD, who presented to the hospital due to shortness of breath.  1. Acute on Chronic respiratory failure with hypoxia-secondary to CHF, COPD. -Status post ultrasound-guided thoracentesis on 02/21/16 with 550 cc of fluid removed. Fluid is transudative in nature. -Continue treatment for COPD with DuoNeb's, IV steroids and will switch to Oral pred tomorrow, cont. empiric abx with Vanco, Levaquin.  Improving.  -Wean O2 as tolerated.  2. COPD exacerbation-continue IV steroids but will taper to Oral prednisone tomorrow. Continue scheduled DuoNeb's, Spiriva. -Continue Pulmicort nebs. Clinically improving.  3. CHF-acute on chronic diastolic dysfunction. -Continue diuresis with IV Lasix, about 3 L (-) since admission, continue Cardizem  4. Hyponatremia-chronic in nature and secondary to SIADH. -Sodium improving, continue fluid restriction, salt tabs.  5. Essential hypertension-continue clonidine, Cardizem.  6. GERD-continue Pepcid.  7. Urinary incontinence - continue oxybutynin.  8. Urinary tract infection-patient has urine cultures positive from 02/13/2016 for enterococcus and MRSA. - cont. Vancomycin and should finish treatment by tomorrow X 5 days.   9. Constipation - much improved.  Had a large BM with Mg. Citrate yesterday.  - cont. Miralax PRN.  Appreciate GI input.   D/c to SNF tomorrow.  Discussed w/ Daughter and Child psychotherapistocial worker.    All the records are reviewed and case discussed with Care Management/Social Workerr. Management plans discussed with the patient, family and they are in agreement.  CODE STATUS: DO NOT RESUSCITATE  DVT Prophylaxis: Lovenox  TOTAL TIME TAKING CARE OF THIS PATIENT: 25 minutes.   POSSIBLE D/C IN 1-2 DAYS, DEPENDING ON CLINICAL CONDITION.   Houston SirenSAINANI,Fatema Rabe J M.D on 02/24/2016 at 3:21 PM  Between 7am to 6pm - Pager - [phone]  After  6pm go to [email or web address] - password [email or web address] [facility]  [facility]  Office  [phone]  CC: Primary care physician; Rafael BihariWALKER III, JOHN B, MD

## 2016-02-24 NOTE — Progress Notes (Signed)
Speech Language Pathology Treatment: Dysphagia  Patient Details Name: Alyssa Landry MRN: [id number] DOB: 29-Nov-1918 Today's Date: 02/24/2016 Time: 1330-1400 SLP Time Calculation (min) (ACUTE ONLY): 30 min  Assessment / Plan / Recommendation Clinical Impression  Pt appeared much improved w/ regard to her respiratory status today even holding cup independently to feed self. Pt appeared to adequately tolerate trials of her pureed diet w/ thin liquids w/ no overt s/s of aspiration noted. Oral phase appeared timely and adequate for the consistency of food/drink. Pt tended to need less rest breaks w/ boluses.    HPI HPI: Pt is a 82 y.o. female with a past medical history of atrial fibrillation, COPD, CHF, who presents the emergency department for shortness of breath starting this morning. According to the patient since this morning she has felt considerable shortness of breath. Denies cough, congestion, fever, sputum production. Denies chest pain. Patient was recently started on 2 L oxygen via nasal cannula 01/30/16. Pt is having increased respiratory effort since not feeling well w/ constipation per Dtr. Pt recevied tx for the constipation this morning. Pt also has a dxs of Reflux and hiatal hernia w/ multiple other medical issues. When taking pills w/ puree this morning, pt attempted to use sips of water to "wash it down" then began coughing per report. Pt does use a puree to swallow pills w/ at baseline per Dtr. Of note, a recent CT(2017) stated Patulous thoracic esophagus with fluid level, suggesting Esophageal dysmotility or Reflux. Dtr endorsded a hiatal hernia which could be impacting Esophageal motility as well. Pt's breathing appears improved today. Pt and Dtr stated the diet consistency was "good" for pt "right now because it's easier".       SLP Plan  Continue with current plan of care (trial upgrade to Dysphagia level 2 tomorrow)     Recommendations  Diet recommendations: Dysphagia 1  (puree);Thin liquid Liquids provided via: No straw;Cup Medication Administration: Whole meds with puree (tolerated well w/ NSG today) Supervision: Patient able to self feed;Staff to assist with self feeding;Full supervision/cueing for compensatory strategies Compensations: Minimize environmental distractions;Slow rate;Small sips/bites;Multiple dry swallows after each bite/sip Postural Changes and/or Swallow Maneuvers: Seated upright 90 degrees;Upright 30-60 min after meal             General recommendations:  (Dietician) Oral Care Recommendations: Oral care BID;Staff/trained caregiver to provide oral care Follow up Recommendations:  (TBD) Plan: Continue with current plan of care (trial upgrade to Dysphagia level 2 tomorrow)     GO               Jerilynn SomKatherine Creta Dorame, MS, CCC-SLP  Collin Hendley 02/24/2016, 3:57 PM

## 2016-02-25 LAB — COMP PANEL: LEUKEMIA/LYMPHOMA

## 2016-02-25 LAB — SODIUM: SODIUM: 128 mmol/L — AB (ref 135–145)

## 2016-02-25 MED ORDER — IPRATROPIUM-ALBUTEROL 0.5-2.5 (3) MG/3ML IN SOLN: 3.0000 mL | Freq: Four times a day (QID) | RESPIRATORY_TRACT | Status: DC | PRN

## 2016-02-25 MED ORDER — LORAZEPAM 0.5 MG PO TABS
0.5000 mg | ORAL_TABLET | Freq: Three times a day (TID) | ORAL | 0 refills | Status: DC
Start: 2016-02-25 — End: 2016-10-12

## 2016-02-25 MED ORDER — PREDNISONE 10 MG PO TABS: ORAL_TABLET | ORAL | Status: DC

## 2016-02-25 NOTE — Discharge Summary (Addendum)
[facility] - [place] at [facility]   PATIENT NAME: Alyssa Landry    MR#:  [id number]  DATE OF BIRTH:  01/11/1919  DATE OF ADMISSION:  02/18/2016 ADMITTING PHYSICIAN: Delfino Lovett, MD  DATE OF DISCHARGE: 02/25/2016  PRIMARY CARE PHYSICIAN: Rafael Bihari, MD    ADMISSION DIAGNOSIS:  Hyponatremia [E87.1] SOB (shortness of breath) [R06.02] Congestive heart failure, unspecified congestive heart failure chronicity, unspecified congestive heart failure type (HCC) [I50.9]  DISCHARGE DIAGNOSIS:  Active Problems:   Pulmonary edema   Pressure ulcer   Hypoxia   SOB (shortness of breath)   Collapse of left lung   Pleural effusion   Acute respiratory distress (HCC)   Centrilobular emphysema (HCC)   Hyponatremia   SECONDARY DIAGNOSIS:   Past Medical History:  Diagnosis Date  . A-fib (HCC)   . Acid reflux   . Anemia   . Anxiety   . Arthritis   . Asthma   . Chronic hyponatremia   . COPD (chronic obstructive pulmonary disease) (HCC)   . GERD (gastroesophageal reflux disease)   . Hiatal hernia   . Hiatal hernia   . Hyperlipidemia   . Hypertension   . Hypothyroidism   . Incontinence   . Migraine   . Osteoporosis, post-menopausal   . Persistent cough   . Pulmonary fibrosis (HCC)   . SIADH (syndrome of inappropriate ADH production) (HCC)   . TIA (transient ischemic attack)     HOSPITAL COURSE:   82 year old female with past medical history of hypertension, hyperlipidemia, hypothyroidism, osteoporosis, history of pulmonary fibrosis, chronic hyponatremia secondary to SIADH, GERD, who presented to the hospital due to shortness of breath.  1. Acute on Chronic respiratory failure with hypoxia-secondary to CHF, COPD. - pt. Was treated for both conditions w/ IV Lasix and also IV Steroids, duonebs, Pulm. Nebs.  She also underwent an US guided thoracentesis on 02/21/16 with 550 cc of fluid removed. Fluid was transudative in nature. - she has clinically improved and  now being discharged on Oral Pred and oral diuretics to SNF.   - she will be on O2 at 2L [place] continuously.    2. COPD exacerbation-Pt. Was treated w/ IV steroids, Pulmicort nebs, scheduled duonebs.  She was also given Levaquin empirically for pneumonia.  - she has clinically improved and now being discharged to SNF on Oral Pred. Taper, duonebs PRN, Spiriva, albuterol inhaler as needed.   3. CHF-acute on chronic diastolic dysfunction. - pt. Was diuresed w/ IV lasix and has clinically improved. Now being discharged on Oral Lasix.  - about 3.5 L (-) since admission. Cont. 2l [place] O2.    4. Hyponatremia-chronic in nature and secondary to SIADH. - sodium improved w/ fluid restriction, salt tabs and can be further followed as outpatient.   5. Essential hypertension- she will continue clonidine, Cardizem.  6. GERD- she will resume her Zantac.  7. Urinary incontinence - she will  continue oxybutynin.  8. Urinary tract infection-patient has urine cultures positive from 02/13/2016 for enterococcus and MRSA. - pt. Has finished treatment with IV Abx with Vancomycin.  Afebrile, hemodynamically stable.    9. Constipation - much improved and resolved w/ Laxatives and Mg. Citrate.    She is being discharged to SNF for ongoing care.    DISCHARGE CONDITIONS:   Stable.   CONSULTS OBTAINED:  Treatment Team:  Lacey Jensen, MD  DRUG ALLERGIES:   Allergies  Allergen Reactions  . Aspirin Other (See Comments)    Upset stomach with  high doses  . Codeine Other (See Comments)    Reaction:  Unknown   . Ditropan [Oxybutynin] Other (See Comments)    Reaction:  Unknown   . Hctz [Hydrochlorothiazide] Other (See Comments)    Reaction:  Causes pts sodium/potassium levels to drop significantly   . Metronidazole Other (See Comments)    Reaction:  Unknown   . Propranolol Other (See Comments)    Reaction:  Unknown   . Tavist [Clemastine] Other (See Comments)    Reaction:  Unknown   . Tramadol Other  (See Comments)    Reaction:  Unknown   . Vicodin [Hydrocodone-Acetaminophen] Other (See Comments)    Reaction:  Unknown     DISCHARGE MEDICATIONS:     Medication List    TAKE these medications   albuterol 108 (90 Base) MCG/ACT inhaler Commonly known as:  PROVENTIL HFA;VENTOLIN HFA Inhale 1-2 puffs into the lungs every 4 (four) hours as needed for wheezing or shortness of breath.   aspirin EC 81 MG tablet Take 81 mg by mouth every evening.   cholecalciferol 1000 units tablet Commonly known as:  VITAMIN D Take 1,000 Units by mouth daily.   cloNIDine 0.1 MG tablet Commonly known as:  CATAPRES Take 0.1 mg by mouth 3 (three) times daily.   clopidogrel 75 MG tablet Commonly known as:  PLAVIX Take 75 mg by mouth daily with breakfast.   diltiazem 120 MG 24 hr capsule Commonly known as:  CARDIZEM CD Take 1 capsule (120 mg total) by mouth daily. What changed:  when to take this   docusate sodium 100 MG capsule Commonly known as:  COLACE Take 100 mg by mouth 2 (two) times daily.   feeding supplement (ENSURE ENLIVE) Liqd Take 237 mLs by mouth daily.   ferrous sulfate 325 (65 FE) MG tablet Take 325 mg by mouth 2 (two) times daily with a meal.   fluticasone 110 MCG/ACT inhaler Commonly known as:  FLOVENT HFA Inhale 2 puffs into the lungs 2 (two) times daily.   fluticasone 50 MCG/ACT nasal spray Commonly known as:  FLONASE Place 2 sprays into both nostrils every morning.   furosemide 20 MG tablet Commonly known as:  LASIX Take 1 tablet (20 mg total) by mouth daily.   ipratropium-albuterol 0.5-2.5 (3) MG/3ML Soln Commonly known as:  DUONEB Take 3 mLs by nebulization every 6 (six) hours as needed (shortness of breath, wheezing.).   levothyroxine 100 MCG tablet Commonly known as:  SYNTHROID, LEVOTHROID Take 100 mcg by mouth daily before breakfast.   LORazepam 0.5 MG tablet Commonly known as:  ATIVAN Take 1 tablet (0.5 mg total) by mouth 3 (three) times daily.    losartan 50 MG tablet Commonly known as:  COZAAR Take 50 mg by mouth daily.   multivitamin with minerals Tabs tablet Take 1 tablet by mouth daily.   Oxybutynin Chloride 10 % Gel Place 1 application onto the skin at bedtime.   polyethylene glycol packet Commonly known as:  MIRALAX / GLYCOLAX Take 17 g by mouth daily. *Mix in 4-8 ounces of fluid prior to taking*   predniSONE 10 MG tablet Commonly known as:  DELTASONE Label  & dispense according to the schedule below. 5 Pills PO for 1 day then, 4 Pills PO for 1 day, 3 Pills PO for 1 day, 2 Pills PO for 1 day, 1 Pill PO for 1 days then STOP.   ranitidine 150 MG tablet Commonly known as:  ZANTAC Take 150 mg by mouth 2 (two) times  daily before a meal.   sodium bicarbonate 325 MG tablet Take 325 mg by mouth 4 (four) times daily.   tiotropium 18 MCG inhalation capsule Commonly known as:  SPIRIVA Place 18 mcg into inhaler and inhale daily.   vitamin B-12 1000 MCG tablet Commonly known as:  CYANOCOBALAMIN Take 1,000 mcg by mouth daily.         DISCHARGE INSTRUCTIONS:   DIET:   Diet Discharge Recommendations:  Dysphagia level 2; thin liquids. NO STRAWS!!! Likes using coffee mug. Meds Whole w/ Applesauce, a puree.  Add Gravy to moisten meats; condiments. Have a puree food w/ the meal.  Aspiration precautions - rest breaks w/ meals; mini meals more frequently.  DISCHARGE CONDITION:  Stable  ACTIVITY:  Activity as tolerated  OXYGEN:  Home Oxygen: Yes.     Oxygen Delivery: 2 liters/min via Patient connected to nasal cannula oxygen  DISCHARGE LOCATION:  nursing home   If you experience worsening of your admission symptoms, develop shortness of breath, life threatening emergency, suicidal or homicidal thoughts you must seek medical attention immediately by calling 911 or calling your MD immediately  if symptoms less severe.  You Must read complete instructions/literature along with all the possible adverse  reactions/side effects for all the Medicines you take and that have been prescribed to you. Take any new Medicines after you have completely understood and accpet all the possible adverse reactions/side effects.   Please note  You were cared for by a hospitalist during your hospital stay. If you have any questions about your discharge medications or the care you received while you were in the hospital after you are discharged, you can call the unit and asked to speak with the hospitalist on call if the hospitalist that took care of you is not available. Once you are discharged, your primary care physician will handle any further medical issues. Please note that NO REFILLS for any discharge medications will be authorized once you are discharged, as it is imperative that you return to your primary care physician (or establish a relationship with a primary care physician if you do not have one) for your aftercare needs so that they can reassess your need for medications and monitor your lab values.     Today   No acute events overnight. Shortness of breath improved.  Daughter at bedside.  Will d/c to SNF today.   VITAL SIGNS:  Blood pressure 134/72, pulse 72, temperature 97.5 F (36.4 C), temperature source Oral, resp. rate 20, height 4\' 9"  (1.448 m), weight 53.7 kg (118 lb 4.8 oz), SpO2 98 %.  I/O:    Intake/Output Summary (Last 24 hours) at 02/25/16 1101 Last data filed at 02/25/16 0605  Gross per 24 hour  Intake                0 ml  Output              275 ml  Net             -275 ml    PHYSICAL EXAMINATION:   GENERAL:  82 y.o.-year-old patient lying in the bed in no acute distress.  EYES: Pupils equal, round, reactive to light and accommodation. No scleral icterus. Extraocular muscles intact.  HEENT: Head atraumatic, normocephalic. Oropharynx and nasopharynx clear.  NECK:  Supple, no jugular venous distention. No thyroid enlargement, no tenderness.  LUNGS: Poor resp. effort, no  wheezing, rhonchi, bibasilar rales. No use of accessory muscles of respiration.  CARDIOVASCULAR: S1, S2  normal. No murmurs, rubs, or gallops.  ABDOMEN: Soft, nontender, nondistended. Bowel sounds present. No organomegaly or mass.  EXTREMITIES: No cyanosis, clubbing or edema b/l.    NEUROLOGIC: Cranial nerves II through XII are intact. No focal Motor or sensory deficits b/l. Globally weak.  PSYCHIATRIC: The patient is alert and oriented x 3.  SKIN: No obvious rash, lesion, or ulcer.   DATA REVIEW:   CBC  Recent Labs Lab 02/24/16 0408  WBC 12.6*  HGB 10.6*  HCT 30.7*  PLT 493*    Chemistries   Recent Labs Lab 02/19/16 0253  02/23/16 0934  02/25/16 0434  NA 116*  < > 128*  < > 128*  K 3.5  < > 4.6  --   --   CL 70*  < > 76*  --   --   CO2 36*  < > 44*  --   --   GLUCOSE 213*  < > 164*  --   --   BUN 5*  < > 19  --   --   CREATININE 0.42*  < > 0.63  --   --   CALCIUM 8.4*  < > 8.6*  --   --   AST 19  --   --   --   --   ALT 16  --   --   --   --   ALKPHOS 109  --   --   --   --   BILITOT 0.5  --   --   --   --   < > = values in this interval not displayed.  Cardiac Enzymes  Recent Labs Lab 02/19/16 1348  TROPONINI <0.03    Microbiology Results  Results for orders placed or performed during the hospital encounter of 02/18/16  Culture, body fluid-bottle     Status: None (Preliminary result)   Collection Time: 02/21/16  2:15 PM  Result Value Ref Range Status   Specimen Description FLUID PLEURAL  Final   Special Requests   Final    BOTTLES DRAWN AEROBIC AND ANAEROBIC 10CC AER,5CC ANA   Culture   Final    NO GROWTH 3 DAYS Performed at [facility]    Report Status PENDING  Incomplete  Gram stain     Status: None   Collection Time: 02/21/16  2:15 PM  Result Value Ref Range Status   Specimen Description FLUID PLEURAL  Final   Special Requests NONE  Final   Gram Stain   Final    WBC PRESENT,BOTH PMN AND MONONUCLEAR NO ORGANISMS SEEN CYTOSPIN  SMEAR Performed at [facility]    Report Status 02/21/2016 FINAL  Final    RADIOLOGY:  No results found.    Management plans discussed with the patient, family and they are in agreement.  CODE STATUS:     Code Status Orders        Start     Ordered   02/18/16 2311  Do not attempt resuscitation (DNR)  Continuous    Question Answer Comment  In the event of cardiac or respiratory ARREST Do not call a "code blue"   In the event of cardiac or respiratory ARREST Do not perform Intubation, CPR, defibrillation or ACLS   In the event of cardiac or respiratory ARREST Use medication by any route, position, wound care, and other measures to relive pain and suffering. May use oxygen, suction and manual treatment of airway obstruction as needed for comfort.  02/18/16 2310    Code Status History    Date Active Date Inactive Code Status Order ID Comments User Context   01/26/2016  9:56 PM 01/30/2016  7:34 PM DNR [id number]  Houston Siren, MD Inpatient   12/11/2015  9:29 PM 12/15/2015  8:13 PM DNR [id number]  Ramonita Lab, MD Inpatient   09/25/2015  4:17 PM 09/26/2015 10:07 PM DNR [id number]  Altamese Dilling, MD ED    Advance Directive Documentation   Flowsheet Row Most Recent Value  Type of Advance Directive  Healthcare Power of Attorney  Pre-existing out of facility DNR order (yellow form or pink MOST form)  No data  "MOST" Form in Place?  No data      TOTAL TIME TAKING CARE OF THIS PATIENT: 40 minutes.    Houston Siren M.D on 02/25/2016 at 11:01 AM  Between 7am to 6pm - Pager - [phone]  After 6pm go to [email or web address] - password [email or web address] [facility]  [facility]  Office  [phone]  CC: Primary care physician; Rafael Bihari, MD

## 2016-02-25 NOTE — Clinical Social Work Placement (Signed)
   CLINICAL SOCIAL WORK PLACEMENT  NOTE  Date:  02/25/2016  Patient Details  Name: Alyssa Landry MRN: [id number] Date of Birth: [birth year]  Clinical Social Work is seeking post-discharge placement for this patient at the Skilled  Nursing Facility level of care (*CSW will initial, date and re-position this form in  chart as items are completed):  Yes   Patient/family provided with [place] Clinical Social Work Department's list of facilities offering this level of care within the geographic area requested by the patient (or if unable, by the patient's family).  Yes   Patient/family informed of their freedom to choose among providers that offer the needed level of care, that participate in Medicare, Medicaid or managed care program needed by the patient, have an available bed and are willing to accept the patient.  Yes   Patient/family informed of [place]'s ownership interest in [facility] and [facility], as well as of the fact that they are under no obligation to receive care at these facilities.  PASRR submitted to EDS on       PASRR number received on       Existing PASRR number confirmed on 02/23/16     FL2 transmitted to all facilities in geographic area requested by pt/family on 02/23/16     FL2 transmitted to all facilities within larger geographic area on       Patient informed that his/her managed care company has contracts with or will negotiate with certain facilities, including the following:        Yes   Patient/family informed of bed offers received.  Patient chooses bed at  [facility])     Physician recommends and patient chooses bed at  [facility](SNF)    Patient to be transferred to  ([facility]) on 02/25/16.  Patient to be transferred to facility by  (EMS)     Patient family notified on 02/25/16 of transfer.  Name of family member notified:   (patient's daughter: Ms. Alyssa Landry)     PHYSICIAN       Additional Comment:     _______________________________________________ York SpanielMonica Misha Antonini, LCSW 02/25/2016, 10:51 AM

## 2016-02-25 NOTE — Care Management Important Message (Signed)
Important Message  Patient Details  Name: Alyssa Landry MRN: [id number] Date of Birth: Nov 27, 1918   Medicare Important Message Given:  Yes    Chapman FitchBOWEN, Thuy Atilano T, RN 02/25/2016, 2:36 PM

## 2016-02-25 NOTE — Clinical Social Work Note (Signed)
Patient to discharge today to [facility]. Patient and daughter are aware and request EMS transport. Discharge information sent to [facility] and Jomarie LongsJoseph at [facility] aware of patient coming. Nurse to call report then EMS. York SpanielMonica Liyat Faulkenberry MSW,LCSW [phone]

## 2016-02-25 NOTE — Progress Notes (Signed)
[facility] called to [facility] at [facility]. Deysha Cartier L Kendan Cornforth

## 2016-02-25 NOTE — Progress Notes (Signed)
02/25/2016  BP (!) 133/49 (BP Location: Left Arm)   Pulse 83   Temp 97.5 F (36.4 C)   Resp 16   Ht 4\' 9"  (1.448 m)   Wt 53.7 kg (118 lb 4.8 oz)   SpO2 98%   BMI 25.60 kg/m  Patient discharged per MD orders. Report called to [facility] by Kathline MagicLucrecia, Charity fundraiserN.  IV removed per policy. Prescriptions and other paperwork given to EMS when arrived for transport to facility. Discharged via stretcher escorted by EMS.   Ron ParkerHerron, Jettie Lazare D, RN

## 2016-02-26 ENCOUNTER — Encounter: Payer: Self-pay | Admitting: Family

## 2016-02-26 LAB — CULTURE, BODY FLUID W GRAM STAIN -BOTTLE

## 2016-02-26 LAB — CULTURE, BODY FLUID-BOTTLE: CULTURE: NO GROWTH

## 2016-03-02 ENCOUNTER — Ambulatory Visit: Payer: Medicare Other | Admitting: Family

## 2016-03-23 ENCOUNTER — Ambulatory Visit: Payer: Medicare Other | Admitting: Family

## 2016-04-09 ENCOUNTER — Ambulatory Visit: Payer: Medicare Other | Admitting: Family

## 2016-04-14 ENCOUNTER — Encounter: Payer: Self-pay | Admitting: Family

## 2016-04-14 ENCOUNTER — Ambulatory Visit: Payer: Medicare Other | Attending: Family | Admitting: Family

## 2016-04-14 VITALS — BP 158/80 | HR 82 | Resp 18 | Ht <= 58 in | Wt 106.0 lb

## 2016-04-14 DIAGNOSIS — I110 Hypertensive heart disease with heart failure: Secondary | ICD-10-CM | POA: Insufficient documentation

## 2016-04-14 DIAGNOSIS — Z79899 Other long term (current) drug therapy: Secondary | ICD-10-CM | POA: Diagnosis not present

## 2016-04-14 DIAGNOSIS — E785 Hyperlipidemia, unspecified: Secondary | ICD-10-CM | POA: Diagnosis not present

## 2016-04-14 DIAGNOSIS — E039 Hypothyroidism, unspecified: Secondary | ICD-10-CM | POA: Insufficient documentation

## 2016-04-14 DIAGNOSIS — Z8673 Personal history of transient ischemic attack (TIA), and cerebral infarction without residual deficits: Secondary | ICD-10-CM | POA: Insufficient documentation

## 2016-04-14 DIAGNOSIS — K449 Diaphragmatic hernia without obstruction or gangrene: Secondary | ICD-10-CM | POA: Insufficient documentation

## 2016-04-14 DIAGNOSIS — E222 Syndrome of inappropriate secretion of antidiuretic hormone: Secondary | ICD-10-CM | POA: Insufficient documentation

## 2016-04-14 DIAGNOSIS — J449 Chronic obstructive pulmonary disease, unspecified: Secondary | ICD-10-CM | POA: Insufficient documentation

## 2016-04-14 DIAGNOSIS — I10 Essential (primary) hypertension: Secondary | ICD-10-CM

## 2016-04-14 DIAGNOSIS — I5032 Chronic diastolic (congestive) heart failure: Secondary | ICD-10-CM | POA: Insufficient documentation

## 2016-04-14 DIAGNOSIS — K219 Gastro-esophageal reflux disease without esophagitis: Secondary | ICD-10-CM | POA: Insufficient documentation

## 2016-04-14 DIAGNOSIS — Z7982 Long term (current) use of aspirin: Secondary | ICD-10-CM | POA: Insufficient documentation

## 2016-04-14 DIAGNOSIS — E871 Hypo-osmolality and hyponatremia: Secondary | ICD-10-CM

## 2016-04-14 DIAGNOSIS — I4891 Unspecified atrial fibrillation: Secondary | ICD-10-CM | POA: Diagnosis not present

## 2016-04-14 DIAGNOSIS — J8410 Pulmonary fibrosis, unspecified: Secondary | ICD-10-CM | POA: Insufficient documentation

## 2016-04-14 DIAGNOSIS — Z885 Allergy status to narcotic agent status: Secondary | ICD-10-CM | POA: Diagnosis not present

## 2016-04-14 NOTE — Progress Notes (Signed)
Patient ID: Alyssa Landry, female    DOB: Mar 18, 1919, 82 y.o.   MRN: [id number]  HPI  Alyssa Landry is a 82 y/o female with a history of a-fib, anemia, COPD, chronic hyponatremia, hyperlipidemia, HTN, pulmonary fibrosis, SIADH, TIA and heart failure with preserved ejection fraction.  Last echo was done 01/27/16 which showed an EF of 55-60% with mild aortic regurgitation and mild MR. PA pressure mildly elevated at 35. Does not have a cardiologist that she sees.  Last admission to [facility] was 02/18/16 for pulmonary edema and hyponatremia. She was treated with IV diuretics and nebulizers as well as an US guided thoracentesis on 02/21/16 with removal of 550 cc fluid from the left lung. Began oxygen & antibiotics. Chronic hyponatremia.  She presents today for her initial visit with fatigue and shortness of breath with moderate exertion. Was able to walk into the office today using her walker with some shortness of breath which quickly improved at rest. Is wearing oxygen at 1.5 L around the clock. Already weighing daily. Not adding salt to her food but does take sodium bicarbonate tablets. Wears TED hose daily & also props up her legs during the day.  Past Medical History:  Diagnosis Date  . A-fib (HCC)   . Acid reflux   . Anemia   . Anxiety   . Arthritis   . Asthma   . Chronic hyponatremia   . COPD (chronic obstructive pulmonary disease) (HCC)   . GERD (gastroesophageal reflux disease)   . Hiatal hernia   . Hiatal hernia   . Hyperlipidemia   . Hypertension   . Hypothyroidism   . Incontinence   . Migraine   . Osteoporosis, post-menopausal   . Persistent cough   . Pulmonary fibrosis (HCC)   . SIADH (syndrome of inappropriate ADH production) (HCC)   . TIA (transient ischemic attack)     Past Surgical History:  Procedure Laterality Date  . ABDOMINAL HYSTERECTOMY  1966    Family History  Problem Relation Age of Onset  . Leukemia Mother   . Heart disease Father   . Hypertension       Social History  Substance Use Topics  . Smoking status: Never Smoker  . Smokeless tobacco: Never Used  . Alcohol use No    Allergies  Allergen Reactions  . Aspirin Other (See Comments)    Upset stomach with high doses  . Codeine Other (See Comments)    Reaction:  Unknown   . Ditropan [Oxybutynin] Other (See Comments)    Reaction:  Unknown   . Hctz [Hydrochlorothiazide] Other (See Comments)    Reaction:  Causes pts sodium/potassium levels to drop significantly   . Metronidazole Other (See Comments)    Reaction:  Unknown   . Propranolol Other (See Comments)    Reaction:  Unknown   . Tavist [Clemastine] Other (See Comments)    Reaction:  Unknown   . Tramadol Other (See Comments)    Reaction:  Unknown   . Vicodin [Hydrocodone-Acetaminophen] Other (See Comments)    Reaction:  Unknown     Prior to Admission medications   Medication Sig Start Date End Date Taking? Authorizing Provider  acetaminophen (TYLENOL) 650 MG CR tablet Take 650 mg by mouth every 8 (eight) hours as needed for pain.   Yes Historical Provider, MD  albuterol (PROVENTIL HFA;VENTOLIN HFA) 108 (90 BASE) MCG/ACT inhaler Inhale 1-2 puffs into the lungs every 4 (four) hours as needed for wheezing or shortness of breath.   Yes  Historical Provider, MD  aspirin EC 81 MG tablet Take 81 mg by mouth every evening.    Yes Historical Provider, MD  cholecalciferol (VITAMIN D) 1000 UNITS tablet Take 1,000 Units by mouth daily.   Yes Historical Provider, MD  cloNIDine (CATAPRES) 0.1 MG tablet Take 0.1 mg by mouth 3 (three) times daily.   Yes Historical Provider, MD  clopidogrel (PLAVIX) 75 MG tablet Take 75 mg by mouth daily with breakfast.    Yes Historical Provider, MD  diltiazem (CARDIZEM CD) 120 MG 24 hr capsule Take 1 capsule (120 mg total) by mouth daily. Patient taking differently: Take 120 mg by mouth daily with lunch.  12/15/15  Yes Wyatt Haste, MD  docusate sodium (COLACE) 100 MG capsule Take 100 mg by mouth 2 (two)  times daily.    Yes Historical Provider, MD  feeding supplement, ENSURE ENLIVE, (ENSURE ENLIVE) LIQD Take 237 mLs by mouth daily.    Yes Historical Provider, MD  ferrous sulfate 325 (65 FE) MG tablet Take 325 mg by mouth 2 (two) times daily with a meal.    Yes Historical Provider, MD  fluticasone (FLONASE) 50 MCG/ACT nasal spray Place 2 sprays into both nostrils every morning.    Yes Historical Provider, MD  fluticasone (FLOVENT HFA) 110 MCG/ACT inhaler Inhale 2 puffs into the lungs 2 (two) times daily.   Yes Historical Provider, MD  furosemide (LASIX) 20 MG tablet Take 1 tablet (20 mg total) by mouth daily. Patient taking differently: Take 20 mg by mouth daily. Take 2 tablets daily on M, W, F and 1 tablet daily on T, Thu, Sat, Sun 01/30/16  Yes Houston Siren, MD  ipratropium-albuterol (DUONEB) 0.5-2.5 (3) MG/3ML SOLN Take 3 mLs by nebulization every 6 (six) hours as needed (shortness of breath, wheezing.). 02/25/16  Yes Houston Siren, MD  levothyroxine (SYNTHROID, LEVOTHROID) 125 MCG tablet Take 125 mcg by mouth daily before breakfast.   Yes Historical Provider, MD  LORazepam (ATIVAN) 0.5 MG tablet Take 1 tablet (0.5 mg total) by mouth 3 (three) times daily. 02/25/16  Yes Houston Siren, MD  losartan (COZAAR) 50 MG tablet Take 50 mg by mouth daily.   Yes Historical Provider, MD  Multiple Vitamin (MULTIVITAMIN WITH MINERALS) TABS tablet Take 1 tablet by mouth daily.   Yes Historical Provider, MD  Oxybutynin Chloride 10 % GEL Place 1 application onto the skin at bedtime.   Yes Historical Provider, MD  polyethylene glycol (MIRALAX / GLYCOLAX) packet Take 17 g by mouth daily. *Mix in 4-8 ounces of fluid prior to taking*   Yes Historical Provider, MD  ranitidine (ZANTAC) 150 MG tablet Take 150 mg by mouth 2 (two) times daily before a meal.    Yes Historical Provider, MD  sodium bicarbonate 325 MG tablet Take 325 mg by mouth 4 (four) times daily.   Yes Historical Provider, MD  tiotropium (SPIRIVA) 18  MCG inhalation capsule Place 18 mcg into inhaler and inhale daily.   Yes Historical Provider, MD  vitamin B-12 (CYANOCOBALAMIN) 1000 MCG tablet Take 1,000 mcg by mouth daily.   Yes Historical Provider, MD     Review of Systems  Constitutional: Positive for fatigue. Negative for appetite change.  HENT: Positive for hearing loss. Negative for congestion, rhinorrhea and sore throat.   Eyes: Negative.   Respiratory: Positive for cough (sometimes) and shortness of breath. Negative for chest tightness.   Cardiovascular: Positive for leg swelling. Negative for chest pain and palpitations.  Gastrointestinal: Negative for abdominal  distention and abdominal pain.  Endocrine: Negative.   Genitourinary: Negative.   Musculoskeletal: Negative for back pain and neck pain.  Skin: Negative.   Allergic/Immunologic: Negative.   Neurological: Negative for dizziness and light-headedness.  Hematological: Negative for adenopathy. Bruises/bleeds easily.  Psychiatric/Behavioral: Negative for dysphoric mood and sleep disturbance. The patient is not nervous/anxious.     Vitals:   04/14/16 1328 04/14/16 1354  BP: (!) 161/96 (!) 158/80  Pulse: 82   Resp: 18   SpO2: 99%   Weight: 106 lb (48.1 kg)   Height: 4\' 9"  (1.448 m)      Physical Exam  Constitutional: She is oriented to person, place, and time. She appears well-developed and well-nourished.  HENT:  Head: Normocephalic and atraumatic.  Eyes: Conjunctivae are normal. Pupils are equal, round, and reactive to light.  Neck: Normal range of motion. Neck supple.  Cardiovascular: Normal rate and regular rhythm.   Pulmonary/Chest: Effort normal. She has no wheezes. She has no rales.  Abdominal: Soft. She exhibits no distension. There is no tenderness.  Musculoskeletal: She exhibits edema (trace edema in bilateral lower legs). She exhibits no tenderness.  Neurological: She is alert and oriented to person, place, and time.  Skin: Skin is warm and dry.   Psychiatric: She has a normal mood and affect. Her behavior is normal. Thought content normal.  Vitals reviewed.    Assessment & Plan:  1: Chronic heart failure with preserved ejection fraction- - NYHA Class II - Minimally volume overloaded today with trace edema in bilateral lower legs. Continue TED hose & elevation - Continue weighing daily and call for an overnight weight gain of >2 pounds or a weekly weight gain of >5 pounds - Written dietary information given regarding how to read food labels - Continue oxygen at 1.5L around the clock - Has never seen cardiology as outpatient and isn't interested at this point - Already received her flu vaccine for this year  2: HTN-  - BP improved after recheck with a manual cuff at the end of the visit - Patient said she was nervous about coming - Last saw PCP on 04/08/16  3: Atrial fibrillation- - Currently rate controlled at this time - Taking aspirin, clopidogrel, diltiazem  4: Hyponatremia- - Chronic in nature - Taking sodium bicarbonate QID - Last sodium level was 130 on 04/08/16   Return in 1 month or sooner for any questions/problems before then.

## 2016-04-14 NOTE — Patient Instructions (Signed)
Continue weighing daily and call for an overnight weight gain of > 2 pounds or a weekly weight gain of >5 pounds. 

## 2016-04-16 DIAGNOSIS — I10 Essential (primary) hypertension: Secondary | ICD-10-CM | POA: Insufficient documentation

## 2016-04-16 DIAGNOSIS — I5032 Chronic diastolic (congestive) heart failure: Secondary | ICD-10-CM | POA: Insufficient documentation

## 2016-05-01 ENCOUNTER — Emergency Department: Payer: Medicare Other

## 2016-05-01 ENCOUNTER — Inpatient Hospital Stay
Admission: EM | Admit: 2016-05-01 | Discharge: 2016-05-04 | DRG: 291 | Disposition: A | Discharge status: Skilled Nursing Facility | Payer: Medicare Other | Attending: Internal Medicine | Admitting: Internal Medicine

## 2016-05-01 ENCOUNTER — Inpatient Hospital Stay: Payer: Medicare Other

## 2016-05-01 DIAGNOSIS — Z806 Family history of leukemia: Secondary | ICD-10-CM | POA: Diagnosis not present

## 2016-05-01 DIAGNOSIS — J9621 Acute and chronic respiratory failure with hypoxia: Secondary | ICD-10-CM | POA: Diagnosis present

## 2016-05-01 DIAGNOSIS — Z7902 Long term (current) use of antithrombotics/antiplatelets: Secondary | ICD-10-CM

## 2016-05-01 DIAGNOSIS — Z8249 Family history of ischemic heart disease and other diseases of the circulatory system: Secondary | ICD-10-CM | POA: Diagnosis not present

## 2016-05-01 DIAGNOSIS — Z886 Allergy status to analgesic agent status: Secondary | ICD-10-CM

## 2016-05-01 DIAGNOSIS — I110 Hypertensive heart disease with heart failure: Principal | ICD-10-CM | POA: Diagnosis present

## 2016-05-01 DIAGNOSIS — Z8673 Personal history of transient ischemic attack (TIA), and cerebral infarction without residual deficits: Secondary | ICD-10-CM

## 2016-05-01 DIAGNOSIS — Z79899 Other long term (current) drug therapy: Secondary | ICD-10-CM

## 2016-05-01 DIAGNOSIS — R0602 Shortness of breath: Secondary | ICD-10-CM

## 2016-05-01 DIAGNOSIS — Z888 Allergy status to other drugs, medicaments and biological substances status: Secondary | ICD-10-CM

## 2016-05-01 DIAGNOSIS — E871 Hypo-osmolality and hyponatremia: Secondary | ICD-10-CM | POA: Diagnosis present

## 2016-05-01 DIAGNOSIS — Z885 Allergy status to narcotic agent status: Secondary | ICD-10-CM | POA: Diagnosis not present

## 2016-05-01 DIAGNOSIS — J441 Chronic obstructive pulmonary disease with (acute) exacerbation: Secondary | ICD-10-CM | POA: Diagnosis present

## 2016-05-01 DIAGNOSIS — Z7722 Contact with and (suspected) exposure to environmental tobacco smoke (acute) (chronic): Secondary | ICD-10-CM | POA: Diagnosis present

## 2016-05-01 DIAGNOSIS — E785 Hyperlipidemia, unspecified: Secondary | ICD-10-CM | POA: Diagnosis present

## 2016-05-01 DIAGNOSIS — Z7982 Long term (current) use of aspirin: Secondary | ICD-10-CM | POA: Diagnosis not present

## 2016-05-01 DIAGNOSIS — K219 Gastro-esophageal reflux disease without esophagitis: Secondary | ICD-10-CM | POA: Diagnosis present

## 2016-05-01 DIAGNOSIS — J8410 Pulmonary fibrosis, unspecified: Secondary | ICD-10-CM | POA: Diagnosis present

## 2016-05-01 DIAGNOSIS — I509 Heart failure, unspecified: Secondary | ICD-10-CM

## 2016-05-01 DIAGNOSIS — Z9981 Dependence on supplemental oxygen: Secondary | ICD-10-CM | POA: Diagnosis not present

## 2016-05-01 DIAGNOSIS — E039 Hypothyroidism, unspecified: Secondary | ICD-10-CM | POA: Diagnosis present

## 2016-05-01 DIAGNOSIS — Z66 Do not resuscitate: Secondary | ICD-10-CM | POA: Diagnosis present

## 2016-05-01 DIAGNOSIS — D649 Anemia, unspecified: Secondary | ICD-10-CM | POA: Diagnosis present

## 2016-05-01 DIAGNOSIS — M810 Age-related osteoporosis without current pathological fracture: Secondary | ICD-10-CM | POA: Diagnosis present

## 2016-05-01 DIAGNOSIS — I5033 Acute on chronic diastolic (congestive) heart failure: Secondary | ICD-10-CM | POA: Diagnosis present

## 2016-05-01 DIAGNOSIS — Z9889 Other specified postprocedural states: Secondary | ICD-10-CM

## 2016-05-01 DIAGNOSIS — J90 Pleural effusion, not elsewhere classified: Secondary | ICD-10-CM | POA: Diagnosis present

## 2016-05-01 DIAGNOSIS — I480 Paroxysmal atrial fibrillation: Secondary | ICD-10-CM | POA: Diagnosis present

## 2016-05-01 LAB — BLOOD GAS, VENOUS
Acid-Base Excess: 17.3 mmol/L — ABNORMAL HIGH (ref 0.0–2.0)
Bicarbonate: 44.8 mmol/L — ABNORMAL HIGH (ref 20.0–28.0)
O2 Saturation: 78.5 %
PCO2 VEN: 69 mmHg — AB (ref 44.0–60.0)
PH VEN: 7.42 (ref 7.250–7.430)
PO2 VEN: 42 mmHg (ref 32.0–45.0)
Patient temperature: 37

## 2016-05-01 LAB — BASIC METABOLIC PANEL
ANION GAP: 8 (ref 5–15)
Anion gap: 7 (ref 5–15)
BUN: 9 mg/dL (ref 6–20)
BUN: 10 mg/dL (ref 6–20)
CHLORIDE: 71 mmol/L — AB (ref 101–111)
CHLORIDE: 72 mmol/L — AB (ref 101–111)
CO2: 39 mmol/L — AB (ref 22–32)
CO2: 41 mmol/L — AB (ref 22–32)
CREATININE: 0.42 mg/dL — AB (ref 0.44–1.00)
CREATININE: 0.33 mg/dL — AB (ref 0.44–1.00)
Calcium: 8.8 mg/dL — ABNORMAL LOW (ref 8.9–10.3)
Calcium: 8.8 mg/dL — ABNORMAL LOW (ref 8.9–10.3)
GFR calc non Af Amer: >60 mL/min (ref >60)
GFR calc non Af Amer: >60 mL/min (ref >60)
Glucose, Bld: 151 mg/dL — ABNORMAL HIGH (ref 65–99)
Glucose, Bld: 126 mg/dL — ABNORMAL HIGH (ref 65–99)
POTASSIUM: 4.2 mmol/L (ref 3.5–5.1)
Potassium: 4.1 mmol/L (ref 3.5–5.1)
Sodium: 118 mmol/L — CL (ref 135–145)
Sodium: 120 mmol/L — ABNORMAL LOW (ref 135–145)

## 2016-05-01 LAB — CBC WITH DIFFERENTIAL/PLATELET
BASOS PCT: 1 %
Basophils Absolute: 0.1 10*3/uL (ref 0–0.1)
Eosinophils Absolute: 0 10*3/uL (ref 0–0.7)
Eosinophils Relative: 0 %
HEMATOCRIT: 30.9 % — AB (ref 35.0–47.0)
HEMOGLOBIN: 10.4 g/dL — AB (ref 12.0–16.0)
Lymphocytes Relative: 3 %
Lymphs Abs: 0.4 10*3/uL — ABNORMAL LOW (ref 1.0–3.6)
MCH: 28 pg (ref 26.0–34.0)
MCHC: 33.8 g/dL (ref 32.0–36.0)
MCV: 82.9 fL (ref 80.0–100.0)
MONOS PCT: 8 %
Monocytes Absolute: 1.3 10*3/uL — ABNORMAL HIGH (ref 0.2–0.9)
NEUTROS ABS: 14.6 10*3/uL — AB (ref 1.4–6.5)
NEUTROS PCT: 88 %
Platelets: 383 10*3/uL (ref 150–440)
RBC: 3.73 MIL/uL — ABNORMAL LOW (ref 3.80–5.20)
RDW: 14.6 % — ABNORMAL HIGH (ref 11.5–14.5)
WBC: 16.4 10*3/uL — ABNORMAL HIGH (ref 3.6–11.0)

## 2016-05-01 LAB — MRSA PCR SCREENING: MRSA by PCR: POSITIVE — AB

## 2016-05-01 LAB — BRAIN NATRIURETIC PEPTIDE: B Natriuretic Peptide: 283 pg/mL — ABNORMAL HIGH (ref 0.0–100.0)

## 2016-05-01 LAB — TROPONIN I: Troponin I: <0.03 ng/mL (ref <0.03)

## 2016-05-01 MED ORDER — POLYETHYLENE GLYCOL 3350 17 G PO PACK
17.0000 g | PACK | Freq: Every day | ORAL | Status: DC
  Administered 2016-05-02 – 2016-05-03 (×2): 17 g via ORAL
  Filled 2016-05-01 (×2): qty 1

## 2016-05-01 MED ORDER — FLUTICASONE PROPIONATE HFA 110 MCG/ACT IN AERO: 2.0000 | INHALATION_SPRAY | Freq: Two times a day (BID) | RESPIRATORY_TRACT | Status: DC

## 2016-05-01 MED ORDER — LORAZEPAM 0.5 MG PO TABS
0.5000 mg | ORAL_TABLET | Freq: Three times a day (TID) | ORAL | Status: DC
  Administered 2016-05-01 – 2016-05-04 (×8): 0.5 mg via ORAL
  Filled 2016-05-01 (×8): qty 1

## 2016-05-01 MED ORDER — DILTIAZEM HCL ER COATED BEADS 120 MG PO CP24
120.0000 mg | ORAL_CAPSULE | Freq: Every day | ORAL | Status: DC
  Administered 2016-05-02 – 2016-05-04 (×2): 120 mg via ORAL
  Filled 2016-05-01 (×3): qty 1

## 2016-05-01 MED ORDER — ONDANSETRON HCL 4 MG/2ML IJ SOLN: 4.0000 mg | Freq: Four times a day (QID) | INTRAMUSCULAR | Status: DC | PRN

## 2016-05-01 MED ORDER — MUPIROCIN 2 % EX OINT
1.0000 "application " | TOPICAL_OINTMENT | Freq: Two times a day (BID) | CUTANEOUS | Status: DC
  Administered 2016-05-01 – 2016-05-04 (×6): 1 via NASAL
  Filled 2016-05-01: qty 22

## 2016-05-01 MED ORDER — SPIRONOLACTONE 25 MG PO TABS
25.0000 mg | ORAL_TABLET | Freq: Every day | ORAL | Status: DC
  Administered 2016-05-01 – 2016-05-04 (×4): 25 mg via ORAL
  Filled 2016-05-01 (×4): qty 1

## 2016-05-01 MED ORDER — TOLVAPTAN 15 MG PO TABS
15.0000 mg | ORAL_TABLET | ORAL | Status: DC
  Administered 2016-05-02 – 2016-05-03 (×2): 15 mg via ORAL
  Filled 2016-05-01 (×5): qty 1

## 2016-05-01 MED ORDER — ENSURE ENLIVE PO LIQD
237.0000 mL | Freq: Every day | ORAL | Status: DC
  Administered 2016-05-01 – 2016-05-03 (×3): 237 mL via ORAL

## 2016-05-01 MED ORDER — SODIUM CHLORIDE 0.9% FLUSH
3.0000 mL | Freq: Two times a day (BID) | INTRAVENOUS | Status: DC
  Administered 2016-05-01 – 2016-05-03 (×4): 3 mL via INTRAVENOUS

## 2016-05-01 MED ORDER — DOCUSATE SODIUM 100 MG PO CAPS
100.0000 mg | ORAL_CAPSULE | Freq: Two times a day (BID) | ORAL | Status: DC
  Administered 2016-05-01 – 2016-05-04 (×6): 100 mg via ORAL
  Filled 2016-05-01 (×6): qty 1

## 2016-05-01 MED ORDER — IPRATROPIUM-ALBUTEROL 0.5-2.5 (3) MG/3ML IN SOLN
3.0000 mL | Freq: Once | RESPIRATORY_TRACT | Status: AC
  Administered 2016-05-01: 3 mL via RESPIRATORY_TRACT
  Filled 2016-05-01: qty 3

## 2016-05-01 MED ORDER — ADULT MULTIVITAMIN W/MINERALS CH
1.0000 | ORAL_TABLET | Freq: Every day | ORAL | Status: DC
  Administered 2016-05-02 – 2016-05-04 (×3): 1 via ORAL
  Filled 2016-05-01 (×3): qty 1

## 2016-05-01 MED ORDER — SODIUM BICARBONATE 650 MG PO TABS
325.0000 mg | ORAL_TABLET | Freq: Four times a day (QID) | ORAL | Status: DC
  Administered 2016-05-01 – 2016-05-04 (×10): 325 mg via ORAL
  Filled 2016-05-01 (×5): qty 1
  Filled 2016-05-01: qty 2
  Filled 2016-05-01 (×4): qty 1

## 2016-05-01 MED ORDER — BUDESONIDE 0.25 MG/2ML IN SUSP
0.2500 mg | Freq: Two times a day (BID) | RESPIRATORY_TRACT | Status: DC
  Administered 2016-05-01 – 2016-05-04 (×6): 0.25 mg via RESPIRATORY_TRACT
  Filled 2016-05-01 (×6): qty 2

## 2016-05-01 MED ORDER — SODIUM CHLORIDE 0.9% FLUSH: 3.0000 mL | INTRAVENOUS | Status: DC | PRN

## 2016-05-01 MED ORDER — ALBUTEROL SULFATE HFA 108 (90 BASE) MCG/ACT IN AERS: 1.0000 | INHALATION_SPRAY | RESPIRATORY_TRACT | Status: DC | PRN

## 2016-05-01 MED ORDER — SODIUM CHLORIDE 0.9 % IV SOLN
250.0000 mL | INTRAVENOUS | Status: DC | PRN
Start: 2016-05-01 — End: 2016-05-04

## 2016-05-01 MED ORDER — FAMOTIDINE 20 MG PO TABS
20.0000 mg | ORAL_TABLET | Freq: Two times a day (BID) | ORAL | Status: DC
  Administered 2016-05-01 – 2016-05-04 (×6): 20 mg via ORAL
  Filled 2016-05-01 (×6): qty 1

## 2016-05-01 MED ORDER — LOSARTAN POTASSIUM 50 MG PO TABS
50.0000 mg | ORAL_TABLET | Freq: Every day | ORAL | Status: DC
  Administered 2016-05-02 – 2016-05-04 (×3): 50 mg via ORAL
  Filled 2016-05-01 (×3): qty 1

## 2016-05-01 MED ORDER — VITAMIN B-12 1000 MCG PO TABS
1000.0000 ug | ORAL_TABLET | Freq: Every day | ORAL | Status: DC
  Administered 2016-05-02 – 2016-05-04 (×3): 1000 ug via ORAL
  Filled 2016-05-01 (×3): qty 1

## 2016-05-01 MED ORDER — TIOTROPIUM BROMIDE MONOHYDRATE 18 MCG IN CAPS
18.0000 ug | ORAL_CAPSULE | Freq: Every day | RESPIRATORY_TRACT | Status: DC
  Administered 2016-05-03 – 2016-05-04 (×2): 18 ug via RESPIRATORY_TRACT
  Filled 2016-05-01: qty 5

## 2016-05-01 MED ORDER — LEVOTHYROXINE SODIUM 100 MCG PO TABS
125.0000 ug | ORAL_TABLET | Freq: Every day | ORAL | Status: DC
  Administered 2016-05-02 – 2016-05-04 (×3): 125 ug via ORAL
  Filled 2016-05-01 (×3): qty 1

## 2016-05-01 MED ORDER — ENOXAPARIN SODIUM 30 MG/0.3ML ~~LOC~~ SOLN
30.0000 mg | SUBCUTANEOUS | Status: DC
  Administered 2016-05-01 – 2016-05-03 (×3): 30 mg via SUBCUTANEOUS
  Filled 2016-05-01 (×3): qty 0.3

## 2016-05-01 MED ORDER — FUROSEMIDE 20 MG PO TABS
20.0000 mg | ORAL_TABLET | Freq: Every day | ORAL | Status: DC
Start: 2016-05-02 — End: 2016-05-04
  Administered 2016-05-02 – 2016-05-04 (×3): 20 mg via ORAL
  Filled 2016-05-01 (×3): qty 1

## 2016-05-01 MED ORDER — CHLORHEXIDINE GLUCONATE CLOTH 2 % EX PADS
6.0000 | MEDICATED_PAD | Freq: Every day | CUTANEOUS | Status: DC
  Administered 2016-05-02 – 2016-05-04 (×3): 6 via TOPICAL

## 2016-05-01 MED ORDER — FERROUS SULFATE 325 (65 FE) MG PO TABS
325.0000 mg | ORAL_TABLET | Freq: Every day | ORAL | Status: DC
  Administered 2016-05-02 – 2016-05-04 (×3): 325 mg via ORAL
  Filled 2016-05-01 (×3): qty 1

## 2016-05-01 MED ORDER — FUROSEMIDE 10 MG/ML IJ SOLN
40.0000 mg | Freq: Once | INTRAMUSCULAR | Status: AC
  Administered 2016-05-01: 40 mg via INTRAVENOUS
  Filled 2016-05-01: qty 4

## 2016-05-01 MED ORDER — FLUTICASONE PROPIONATE 50 MCG/ACT NA SUSP
2.0000 | NASAL | Status: DC
  Administered 2016-05-02 – 2016-05-04 (×3): 2 via NASAL
  Filled 2016-05-01: qty 16

## 2016-05-01 MED ORDER — VITAMIN D 1000 UNITS PO TABS
1000.0000 [IU] | ORAL_TABLET | Freq: Every day | ORAL | Status: DC
  Administered 2016-05-02 – 2016-05-04 (×3): 1000 [IU] via ORAL
  Filled 2016-05-01 (×3): qty 1

## 2016-05-01 MED ORDER — ACETAMINOPHEN 325 MG PO TABS
650.0000 mg | ORAL_TABLET | ORAL | Status: DC | PRN
  Administered 2016-05-02: 650 mg via ORAL
  Filled 2016-05-01: qty 2

## 2016-05-01 MED ORDER — ALBUTEROL SULFATE (2.5 MG/3ML) 0.083% IN NEBU
2.5000 mg | INHALATION_SOLUTION | RESPIRATORY_TRACT | Status: DC | PRN
  Administered 2016-05-02 – 2016-05-03 (×2): 2.5 mg via RESPIRATORY_TRACT
  Filled 2016-05-01 (×2): qty 3

## 2016-05-01 NOTE — H&P (Signed)
[facility] - [place] at [facility]   PATIENT NAME: Alyssa Landry    MR#:  [id number]  DATE OF BIRTH:  [birth year]  DATE OF ADMISSION:  05/01/2016  PRIMARY CARE PHYSICIAN: Rafael Bihari, MD   REQUESTING/REFERRING PHYSICIAN: dr Fanny Bien  CHIEF COMPLAINT:    HISTORY OF PRESENT ILLNESS:  Alyssa Landry  is a 82 y.o. female with past medical history of hypertension, hyperlipidemia, hypothyroidism, osteoporosis, history of pulmonary fibrosis, chronic hyponatremia secondary to SIADH, GERD, who presented to the hospital due to shortness of breath. Patient's baseline sodium is 128. She comes in with increasing shortness of breath and weight gain bilateral lower extremity edema generalized weakness and was found to have sodium level 118 Patient was received Lasix 40 mg IV once. She was recently seen by Dr. Dan Humphreys and started on spironolactone at home. Her Lasix dose was increased how worsening continued to not feel well came to the emergency room now being admitted with acute on chronic congestive heart failure diastolic with acute on chronic hyponatremia. Patient has history of left-sided pleural effusion and underwent thoracentesis in August 2017 report of transudate and 550 cc of fluid.  PAST MEDICAL HISTORY:   Past Medical History:  Diagnosis Date  . A-fib (HCC)   . Acid reflux   . Anemia   . Anxiety   . Arthritis   . Asthma   . Chronic hyponatremia   . COPD (chronic obstructive pulmonary disease) (HCC)   . GERD (gastroesophageal reflux disease)   . Hiatal hernia   . Hiatal hernia   . Hyperlipidemia   . Hypertension   . Hypothyroidism   . Incontinence   . Migraine   . Osteoporosis, post-menopausal   . Persistent cough   . Pulmonary fibrosis (HCC)   . SIADH (syndrome of inappropriate ADH production) (HCC)   . TIA (transient ischemic attack)     PAST SURGICAL HISTOIRY:   Past Surgical History:  Procedure Laterality Date  . ABDOMINAL HYSTERECTOMY   1966    SOCIAL HISTORY:   Social History  Substance Use Topics  . Smoking status: Never Smoker  . Smokeless tobacco: Never Used  . Alcohol use No    FAMILY HISTORY:   Family History  Problem Relation Age of Onset  . Leukemia Mother   . Heart disease Father   . Hypertension      DRUG ALLERGIES:   Allergies  Allergen Reactions  . Aspirin Other (See Comments)    Upset stomach with high doses  . Codeine Other (See Comments)    Reaction:  Unknown   . Ditropan [Oxybutynin] Other (See Comments)    Reaction:  Unknown   . Hctz [Hydrochlorothiazide] Other (See Comments)    Reaction:  Causes pts sodium/potassium levels to drop significantly   . Metronidazole Other (See Comments)    Reaction:  Unknown   . Propranolol Other (See Comments)    Reaction:  Unknown   . Tavist [Clemastine] Other (See Comments)    Reaction:  Unknown   . Tramadol Other (See Comments)    Reaction:  Unknown   . Vicodin [Hydrocodone-Acetaminophen] Other (See Comments)    Reaction:  Unknown     REVIEW OF SYSTEMS:  Review of Systems  Constitutional: Negative for chills, fever and weight loss.  HENT: Negative for ear discharge, ear pain and nosebleeds.   Eyes: Negative for blurred vision, pain and discharge.  Respiratory: Positive for shortness of breath. Negative for sputum production, wheezing and stridor.  Cardiovascular: Positive for orthopnea, leg swelling and PND. Negative for chest pain and palpitations.  Gastrointestinal: Negative for abdominal pain, diarrhea, nausea and vomiting.  Genitourinary: Negative for frequency and urgency.  Musculoskeletal: Negative for back pain and joint pain.  Neurological: Positive for weakness. Negative for sensory change, speech change and focal weakness.  Psychiatric/Behavioral: Negative for depression and hallucinations. The patient is not nervous/anxious.      MEDICATIONS AT HOME:   Prior to Admission medications   Medication Sig Start Date End Date  Taking? Authorizing Provider  acetaminophen (TYLENOL) 650 MG CR tablet Take 650 mg by mouth every 8 (eight) hours as needed for pain.   Yes Historical Provider, MD  albuterol (PROVENTIL HFA;VENTOLIN HFA) 108 (90 BASE) MCG/ACT inhaler Inhale 1-2 puffs into the lungs every 4 (four) hours as needed for wheezing or shortness of breath.   Yes Historical Provider, MD  aspirin EC 81 MG tablet Take 81 mg by mouth every evening.    Yes Historical Provider, MD  cholecalciferol (VITAMIN D) 1000 UNITS tablet Take 1,000 Units by mouth daily.   Yes Historical Provider, MD  cloNIDine (CATAPRES) 0.1 MG tablet Take 0.1 mg by mouth 3 (three) times daily.   Yes Historical Provider, MD  clopidogrel (PLAVIX) 75 MG tablet Take 75 mg by mouth daily with breakfast.    Yes Historical Provider, MD  diltiazem (CARDIZEM CD) 120 MG 24 hr capsule Take 1 capsule (120 mg total) by mouth daily. Patient taking differently: Take 120 mg by mouth daily with lunch.  12/15/15  Yes Wyatt Hasteavid K Hower, MD  docusate sodium (COLACE) 100 MG capsule Take 100 mg by mouth 2 (two) times daily.    Yes Historical Provider, MD  ferrous sulfate 325 (65 FE) MG tablet Take 325 mg by mouth daily.    Yes Historical Provider, MD  fluticasone (FLONASE) 50 MCG/ACT nasal spray Place 2 sprays into both nostrils every morning.    Yes Historical Provider, MD  fluticasone (FLOVENT HFA) 110 MCG/ACT inhaler Inhale 2 puffs into the lungs 2 (two) times daily. [id number]   Yes Historical Provider, MD  furosemide (LASIX) 20 MG tablet Take 1 tablet (20 mg total) by mouth daily. Patient taking differently: Take 20 mg by mouth daily. Take 2 tablets daily on M, W, F and 1 tablet daily on T, Thu, Sat, Sun 01/30/16  Yes Houston SirenVivek J Sainani, MD  levofloxacin (LEVAQUIN) 250 MG tablet Take 250 mg by mouth daily.   Yes Historical Provider, MD  levothyroxine (SYNTHROID, LEVOTHROID) 125 MCG tablet Take 125 mcg by mouth daily before breakfast.   Yes Historical Provider, MD  LORazepam (ATIVAN)  0.5 MG tablet Take 1 tablet (0.5 mg total) by mouth 3 (three) times daily. Patient taking differently: Take 0.5 mg by mouth 3 (three) times daily. 0600,1900,2100 02/25/16  Yes Houston SirenVivek J Sainani, MD  losartan (COZAAR) 50 MG tablet Take 50 mg by mouth daily.   Yes Historical Provider, MD  Multiple Vitamin (MULTIVITAMIN WITH MINERALS) TABS tablet Take 1 tablet by mouth daily.   Yes Historical Provider, MD  polyethylene glycol (MIRALAX / GLYCOLAX) packet Take 17 g by mouth daily. *Mix in 4-8 ounces of fluid prior to taking*   Yes Historical Provider, MD  ranitidine (ZANTAC) 150 MG tablet Take 150 mg by mouth 2 (two) times daily before a meal.    Yes Historical Provider, MD  sodium bicarbonate 325 MG tablet Take 325 mg by mouth 4 (four) times daily.   Yes Historical Provider, MD  spironolactone (  ALDACTONE) 25 MG tablet Take 25 mg by mouth daily.   Yes Historical Provider, MD  tiotropium (SPIRIVA) 18 MCG inhalation capsule Place 18 mcg into inhaler and inhale daily.   Yes Historical Provider, MD  vitamin B-12 (CYANOCOBALAMIN) 1000 MCG tablet Take 1,000 mcg by mouth daily.   Yes Historical Provider, MD  feeding supplement, ENSURE ENLIVE, (ENSURE ENLIVE) LIQD Take 237 mLs by mouth daily.     Historical Provider, MD      VITAL SIGNS:  Blood pressure (!) 172/74, pulse 79, temperature 98.1 F (36.7 C), temperature source Oral, resp. rate (!) 30, height 4\' 9"  (1.448 m), weight 49.9 kg (110 lb), SpO2 100 %.  PHYSICAL EXAMINATION:  GENERAL:  82 y.o.-year-old patient lying in the bed with no acute distress.  EYES: Pupils equal, round, reactive to light and accommodation. No scleral icterus. Extraocular muscles intact.  HEENT: Head atraumatic, normocephalic. Oropharynx and nasopharynx clear.  NECK:  Supple, no jugular venous distention. No thyroid enlargement, no tenderness.  LUNGS: bilateral coarse breath sounds bilaterally, no wheezing, bilateral rales,no rhonchi No use of accessory muscles of respiration.   CARDIOVASCULAR: S1, S2 normal. No murmurs, rubs, or gallops. Irregularly irregular ABDOMEN: Soft, nontender, nondistended. Bowel sounds present. No organomegaly or mass.  EXTREMITIES: ++ pedal edema -no cyanosis, or clubbing.  NEUROLOGIC: Cranial nerves II through XII are intact.  Sensation intact. Gait not checked. weak PSYCHIATRIC: The patient is alert and oriented x 3.  SKIN: No obvious rash, lesion, or ulcer.   LABORATORY PANEL:   CBC  Recent Labs Lab 05/01/16 1450  WBC 16.4*  HGB 10.4*  HCT 30.9*  PLT 383   ------------------------------------------------------------------------------------------------------------------  Chemistries   Recent Labs Lab 05/01/16 1450  NA 118*  K 4.2  CL 71*  CO2 39*  GLUCOSE 151*  BUN 9  CREATININE 0.42*  CALCIUM 8.8*   ------------------------------------------------------------------------------------------------------------------  Cardiac Enzymes  Recent Labs Lab 05/01/16 1450  TROPONINI <0.03   ------------------------------------------------------------------------------------------------------------------  RADIOLOGY:  Dg Chest Portable 1 View  Result Date: 05/01/2016 CLINICAL DATA:  Labored breathing EXAM: PORTABLE CHEST 1 VIEW COMPARISON:  02/21/2016 FINDINGS: Cardiac shadow is enlarged. Increasing left-sided pleural effusion is now noted. A small right-sided pleural effusion is again seen. Mild congestive failure is noted without significant interstitial edema. IMPRESSION: Enlarging left-sided pleural effusion. Stable small right pleural effusion. Mild CHF. Electronically Signed   By: Alcide Clever M.D.   On: 05/01/2016 15:19    EKG:   Chronic A. fib IMPRESSION AND PLAN:   82 year old female with past medical history of hypertension, hyperlipidemia, hypothyroidism, osteoporosis, history of pulmonary fibrosis, chronic hyponatremia secondary to SIADH, GERD, who presented to the hospital due to shortness of  breath.  1. Acute on Chronic respiratory failure with hypoxia-secondary to CHF and left pleural effusion, recurrent  -Patient is on chronic home oxygen - pt. Was treated for both conditions w/ IV Lasix  - duonebs, Pulm. Nebs.   -We'll give IV Lasix however this given acute on chronic hyponatremia and congestive heart failure will start patient on tolvaptan -Monitor I's and O's, daily weight, sodium, potassium -Continue by mouth spironolactone -We'll order left-sided Thoracentesis therapeutic. Hold aspirin and Plavix. Patient took Plavix today.   2. COPD exacerbation-mild.  -Patient is not currently wheezing. No indication for steroids - Pulmicort nebs, scheduled duonebs.  3. CHF-acute on chronic diastolic dysfunction. - pt. will be diuresed with IV Lasix and will be started on tolvaptan  4. Hyponatremia-chronic in nature and secondary to SIADH. -avoid  fluid restriction  since patient is on tolvaptan  5. Essential hypertension- she will continue  Cardizem. Holding clonidine  6. GERD- she will resume her Zantac.   All the records are reviewed and case discussed with ED provider. Management plans discussed with the patient, family and they are in agreement.  CODE STATUS: DO NOT RESUSCITATE THIS WAS READDRESSED WITH PATIENT'S DAUGHTER TOTAL TIME TAKING CARE OF THIS PA49minutes.    Kadden Osterhout M.D on 05/01/2016 at 4:38 PM  Between 7am to 6pm - Pager - [phone]  After 6pm go to [email or web address] - password [email or web address] [facility]  [facility]  Office  [phone]  CC: Primary care physician; Rafael Bihari, MD

## 2016-05-01 NOTE — ED Notes (Signed)
Nikki, RN informed that patient is in ultrasound right now, will take patient to the floor when she is done.

## 2016-05-01 NOTE — ED Triage Notes (Signed)
Pt is currently on 3Lof O2 at home, pt appears to have labored breathing but states that she doesn't feel sob, pt has increased fluid retention according to the patient's home health nurse of approx of 5lb

## 2016-05-01 NOTE — ED Notes (Signed)
Pt transported to room 236 

## 2016-05-01 NOTE — Progress Notes (Signed)
Patient MRSA PCR is positive, Dr. Anne HahnWillis was notified.

## 2016-05-01 NOTE — Progress Notes (Signed)
Patient received to 2A from ED. Alert and oriented, able to make needs known. Tele box verified with Dorene GrebeNatalie NT. Skin assessment completed with RN Tammy, patient noted with bruises to lower extremities and upper extremities. Patient stable, no distress noted.

## 2016-05-01 NOTE — ED Provider Notes (Signed)
[facility] Emergency Department Provider Note  ____________________________________________  Time seen: Approximately 2:48 PM  I have reviewed the triage vital signs and the nursing notes.   HISTORY  Chief Complaint Shortness of Breath   HPI Alyssa Landry is a 82 y.o. female history pulmonary fibrosis, paroxysmal atrial fibrillation, anemia, CHF, TIA on Plavix, hypertension, hyperlipidemia who presentsfor evaluation of shortness of breath. Patient was visited by home health nurse yesterday and was noted to have gained 7 pounds over the course of the last week. Her Lasix was increased yesterday to 20 a daily to 20 twice a day. Today patient was complaining of shortness of breath so EMS was called. Patient is on oxygen 3L [place] at home. Patient reports cough productive of yellow sputum but denies fever or chills. She is currently on levofloxacin. Patient denies chest pain, abdominal pain, nausea, vomiting, diarrhea.  Past Medical History:  Diagnosis Date  . A-fib (HCC)   . Acid reflux   . Anemia   . Anxiety   . Arthritis   . Asthma   . Chronic hyponatremia   . COPD (chronic obstructive pulmonary disease) (HCC)   . GERD (gastroesophageal reflux disease)   . Hiatal hernia   . Hiatal hernia   . Hyperlipidemia   . Hypertension   . Hypothyroidism   . Incontinence   . Migraine   . Osteoporosis, post-menopausal   . Persistent cough   . Pulmonary fibrosis (HCC)   . SIADH (syndrome of inappropriate ADH production) (HCC)   . TIA (transient ischemic attack)     Patient Active Problem List   Diagnosis Date Noted  . CHF (congestive heart failure) (HCC) 05/01/2016  . Chronic diastolic heart failure (HCC) 04/16/2016  . HTN (hypertension) 04/16/2016  . Hypoxia   . SOB (shortness of breath)   . Collapse of left lung   . Pleural effusion   . Centrilobular emphysema (HCC)   . Hyponatremia   . Pressure ulcer 02/19/2016  . Atrial fibrillation with RVR (HCC)  12/11/2015  . Urge incontinence 09/26/2015  . Lichenified rash 09/26/2015  . Syncopal episodes 09/25/2015    Past Surgical History:  Procedure Laterality Date  . ABDOMINAL HYSTERECTOMY  1966    Prior to Admission medications   Medication Sig Start Date End Date Taking? Authorizing Provider  acetaminophen (TYLENOL) 650 MG CR tablet Take 650 mg by mouth every 8 (eight) hours as needed for pain.   Yes Historical Provider, MD  albuterol (PROVENTIL HFA;VENTOLIN HFA) 108 (90 BASE) MCG/ACT inhaler Inhale 1-2 puffs into the lungs every 4 (four) hours as needed for wheezing or shortness of breath.   Yes Historical Provider, MD  aspirin EC 81 MG tablet Take 81 mg by mouth every evening.    Yes Historical Provider, MD  cholecalciferol (VITAMIN D) 1000 UNITS tablet Take 1,000 Units by mouth daily.   Yes Historical Provider, MD  cloNIDine (CATAPRES) 0.1 MG tablet Take 0.1 mg by mouth 3 (three) times daily.   Yes Historical Provider, MD  clopidogrel (PLAVIX) 75 MG tablet Take 75 mg by mouth daily with breakfast.    Yes Historical Provider, MD  diltiazem (CARDIZEM CD) 120 MG 24 hr capsule Take 1 capsule (120 mg total) by mouth daily. Patient taking differently: Take 120 mg by mouth daily with lunch.  12/15/15  Yes Wyatt Haste, MD  docusate sodium (COLACE) 100 MG capsule Take 100 mg by mouth 2 (two) times daily.    Yes Historical Provider, MD  ferrous sulfate 325 (65 FE) MG tablet Take 325 mg by mouth daily.    Yes Historical Provider, MD  fluticasone (FLONASE) 50 MCG/ACT nasal spray Place 2 sprays into both nostrils every morning.    Yes Historical Provider, MD  fluticasone (FLOVENT HFA) 110 MCG/ACT inhaler Inhale 2 puffs into the lungs 2 (two) times daily. 8119,1478   Yes Historical Provider, MD  furosemide (LASIX) 20 MG tablet Take 1 tablet (20 mg total) by mouth daily. Patient taking differently: Take 20 mg by mouth daily. Take 2 tablets daily on M, W, F and 1 tablet daily on T, Thu, Sat, Sun 01/30/16   Yes Houston Siren, MD  levofloxacin (LEVAQUIN) 250 MG tablet Take 250 mg by mouth daily.   Yes Historical Provider, MD  levothyroxine (SYNTHROID, LEVOTHROID) 125 MCG tablet Take 125 mcg by mouth daily before breakfast.   Yes Historical Provider, MD  LORazepam (ATIVAN) 0.5 MG tablet Take 1 tablet (0.5 mg total) by mouth 3 (three) times daily. Patient taking differently: Take 0.5 mg by mouth 3 (three) times daily. 0600,1900,2100 02/25/16  Yes Houston Siren, MD  losartan (COZAAR) 50 MG tablet Take 50 mg by mouth daily.   Yes Historical Provider, MD  Multiple Vitamin (MULTIVITAMIN WITH MINERALS) TABS tablet Take 1 tablet by mouth daily.   Yes Historical Provider, MD  polyethylene glycol (MIRALAX / GLYCOLAX) packet Take 17 g by mouth daily. *Mix in 4-8 ounces of fluid prior to taking*   Yes Historical Provider, MD  ranitidine (ZANTAC) 150 MG tablet Take 150 mg by mouth 2 (two) times daily before a meal.    Yes Historical Provider, MD  sodium bicarbonate 325 MG tablet Take 325 mg by mouth 4 (four) times daily.   Yes Historical Provider, MD  spironolactone (ALDACTONE) 25 MG tablet Take 25 mg by mouth daily.   Yes Historical Provider, MD  tiotropium (SPIRIVA) 18 MCG inhalation capsule Place 18 mcg into inhaler and inhale daily.   Yes Historical Provider, MD  vitamin B-12 (CYANOCOBALAMIN) 1000 MCG tablet Take 1,000 mcg by mouth daily.   Yes Historical Provider, MD  feeding supplement, ENSURE ENLIVE, (ENSURE ENLIVE) LIQD Take 237 mLs by mouth daily.     Historical Provider, MD    Allergies Aspirin; Codeine; Ditropan [oxybutynin]; Hctz [hydrochlorothiazide]; Metronidazole; Propranolol; Tavist [clemastine]; Tramadol; and Vicodin [hydrocodone-acetaminophen]  Family History  Problem Relation Age of Onset  . Leukemia Mother   . Heart disease Father   . Hypertension      Social History Social History  Substance Use Topics  . Smoking status: Never Smoker  . Smokeless tobacco: Never Used  . Alcohol  use No    Review of Systems  Constitutional: Negative for fever. Eyes: Negative for visual changes. ENT: Negative for sore throat. Cardiovascular: Negative for chest pain. Respiratory: + shortness of breath. Gastrointestinal: Negative for abdominal pain, vomiting or diarrhea. Genitourinary: Negative for dysuria. Musculoskeletal: Negative for back pain. + swelling of b/l LE Skin: Negative for rash. Neurological: Negative for headaches, weakness or numbness.  ____________________________________________   PHYSICAL EXAM:  VITAL SIGNS: ED Triage Vitals  Enc Vitals Group     BP 05/01/16 1439 (!) 172/74     Pulse Rate 05/01/16 1439 79     Resp 05/01/16 1439 (!) 30     Temp 05/01/16 1439 98.1 F (36.7 C)     Temp Source 05/01/16 1439 Oral     SpO2 05/01/16 1439 100 %     Weight 05/01/16 1440 118 lb (  53.5 kg)     Height 05/01/16 1440 4\' 9"  (1.448 m)     Head Circumference --      Peak Flow --      Pain Score --      Pain Loc --      Pain Edu? --      Excl. in GC? --     Constitutional: Alert and oriented, mild respiratory distress.  HEENT:      Head: Normocephalic and atraumatic.         Eyes: Conjunctivae are normal. Sclera is non-icteric. EOMI. PERRL      Mouth/Throat: Mucous membranes are moist.       Neck: Supple with no signs of meningismus. Cardiovascular: Regular rate and rhythm. No murmurs, gallops, or rubs. 2+ symmetrical distal pulses are present in all extremities. No JVD. Respiratory: Tachypneic, sats are 100 on 3L [place], severely diminished breath sounds bilaterally with faint crackles. No wheezing. Gastrointestinal: Soft, non tender, and non distended with positive bowel sounds. No rebound or guarding. Musculoskeletal: 2+ pitting edema on b/l LE Neurologic: Normal speech and language. Face is symmetric. Moving all extremities. No gross focal neurologic deficits are appreciated. Skin: Skin is warm, dry and intact. No rash noted. Psychiatric: Mood and affect are  normal. Speech and behavior are normal.  ____________________________________________   LABS (all labs ordered are listed, but only abnormal results are displayed)  Labs Reviewed  MRSA PCR SCREENING - Abnormal; Notable for the following:       Result Value   MRSA by PCR POSITIVE (*)    All other components within normal limits  CBC WITH DIFFERENTIAL/PLATELET - Abnormal; Notable for the following:    WBC 16.4 (*)    RBC 3.73 (*)    Hemoglobin 10.4 (*)    HCT 30.9 (*)    RDW 14.6 (*)    Neutro Abs 14.6 (*)    Lymphs Abs 0.4 (*)    Monocytes Absolute 1.3 (*)    All other components within normal limits  BASIC METABOLIC PANEL - Abnormal; Notable for the following:    Sodium 118 (*)    Chloride 71 (*)    CO2 39 (*)    Glucose, Bld 151 (*)    Creatinine, Ser 0.42 (*)    Calcium 8.8 (*)    All other components within normal limits  BRAIN NATRIURETIC PEPTIDE - Abnormal; Notable for the following:    B Natriuretic Peptide 283.0 (*)    All other components within normal limits  BLOOD GAS, VENOUS - Abnormal; Notable for the following:    pCO2, Ven 69 (*)    Bicarbonate 44.8 (*)    Acid-Base Excess 17.3 (*)    All other components within normal limits  SODIUM - Abnormal; Notable for the following:    Sodium 118 (*)    All other components within normal limits  BASIC METABOLIC PANEL - Abnormal; Notable for the following:    Sodium 121 (*)    Chloride 73 (*)    CO2 41 (*)    Glucose, Bld 100 (*)    Creatinine, Ser 0.43 (*)    Calcium 8.5 (*)    All other components within normal limits  BASIC METABOLIC PANEL - Abnormal; Notable for the following:    Sodium 120 (*)    Chloride 72 (*)    CO2 41 (*)    Glucose, Bld 126 (*)    Creatinine, Ser 0.33 (*)    Calcium 8.8 (*)  All other components within normal limits  SODIUM - Abnormal; Notable for the following:    Sodium 120 (*)    All other components within normal limits  TROPONIN I    ____________________________________________  EKG  ED ECG REPORT I, Nita Sickle, the attending physician, personally viewed and interpreted this ECG.  Atrial fibrillation, rate of 80, normal QTC, normal axis, no ST elevations or depressions.  ____________________________________________  RADIOLOGY  CXR:  Enlarging left-sided pleural effusion. Stable small right pleural effusion. Mild CHF. ____________________________________________   PROCEDURES  Procedure(s) performed: None Procedures Critical Care performed:  None ____________________________________________   INITIAL IMPRESSION / ASSESSMENT AND PLAN / ED COURSE  82 y.o. female history pulmonary fibrosis, paroxysmal atrial fibrillation, anemia, CHF, TIA on Plavix, hypertension, hyperlipidemia who presentsfor evaluation of shortness of breath, cough productive of yellow sputum, 7 pound weight gain, bilateral lower extremity edema. Patient is currently satting well on 3 L nasal cannula which is her baseline, she is tachypneic with severely diminished air movement bilaterally, 2+ pitting edema on b/l LE. Presentation concerning for mix COPD/CHF exacerbation. Patient's weight here 110lbs (106lbs on last visit). Will give 40mg  IV lasix, duoneb. Check EKG, troponin, BNP, CBC, BMP, CXR, VBG and admit  Clinical Course     Pertinent labs & imaging results that were available during my care of the patient were reviewed by me and considered in my medical decision making (see chart for details).    ____________________________________________   FINAL CLINICAL IMPRESSION(S) / ED DIAGNOSES  Final diagnoses:  COPD exacerbation (HCC)  Acute on chronic congestive heart failure, unspecified congestive heart failure type (HCC)  Hyponatremia      NEW MEDICATIONS STARTED DURING THIS VISIT:  Current Discharge Medication List       Note:  This document was prepared using Dragon voice recognition software and may  include unintentional dictation errors.    Nita Sickle, MD 05/02/16 [phone]

## 2016-05-01 NOTE — ED Notes (Signed)
Informed RN bed ready 

## 2016-05-01 NOTE — Procedures (Signed)
Successful US guided left sided thoracentesis yielding 750 cc of serous pleural fluid.   EBL: None No immediate complications.  Katherina Right, MD Pager #: [phone]

## 2016-05-02 LAB — BASIC METABOLIC PANEL
ANION GAP: 7 (ref 5–15)
BUN: 10 mg/dL (ref 6–20)
CHLORIDE: 73 mmol/L — AB (ref 101–111)
CO2: 41 mmol/L — AB (ref 22–32)
CREATININE: 0.43 mg/dL — AB (ref 0.44–1.00)
Calcium: 8.5 mg/dL — ABNORMAL LOW (ref 8.9–10.3)
GFR calc non Af Amer: >60 mL/min (ref >60)
GLUCOSE: 100 mg/dL — AB (ref 65–99)
Potassium: 4.4 mmol/L (ref 3.5–5.1)
Sodium: 121 mmol/L — ABNORMAL LOW (ref 135–145)

## 2016-05-02 LAB — SODIUM
Sodium: 118 mmol/L — CL (ref 135–145)
Sodium: 120 mmol/L — ABNORMAL LOW (ref 135–145)

## 2016-05-02 MED ORDER — ASPIRIN EC 81 MG PO TBEC
81.0000 mg | DELAYED_RELEASE_TABLET | Freq: Every day | ORAL | Status: DC
  Administered 2016-05-02 – 2016-05-04 (×3): 81 mg via ORAL
  Filled 2016-05-02 (×2): qty 1

## 2016-05-02 MED ORDER — GUAIFENESIN-DM 100-10 MG/5ML PO SYRP
10.0000 mL | ORAL_SOLUTION | Freq: Four times a day (QID) | ORAL | Status: DC | PRN
  Administered 2016-05-02 – 2016-05-03 (×2): 10 mL via ORAL
  Filled 2016-05-02 (×2): qty 10

## 2016-05-02 MED ORDER — CLONIDINE HCL 0.1 MG PO TABS
0.1000 mg | ORAL_TABLET | Freq: Two times a day (BID) | ORAL | Status: DC
  Administered 2016-05-02 – 2016-05-04 (×5): 0.1 mg via ORAL
  Filled 2016-05-02 (×5): qty 1

## 2016-05-02 MED ORDER — CLOPIDOGREL BISULFATE 75 MG PO TABS
75.0000 mg | ORAL_TABLET | Freq: Every day | ORAL | Status: DC
  Administered 2016-05-02 – 2016-05-04 (×3): 75 mg via ORAL
  Filled 2016-05-02 (×3): qty 1

## 2016-05-02 MED ORDER — LEVOFLOXACIN 500 MG PO TABS
250.0000 mg | ORAL_TABLET | Freq: Every day | ORAL | Status: DC
  Administered 2016-05-03: 250 mg via ORAL
  Filled 2016-05-02 (×2): qty 1

## 2016-05-02 NOTE — Evaluation (Signed)
Physical Therapy Evaluation Patient Details Name: Alyssa Landry MRN: [id number] DOB: 06/21/1919 Today's Date: 05/02/2016   History of Present Illness  82 yo female with onset of SOB and cough with sputum, LE edema, low sodium, acute CHF. PMHx:  CHF, thoracentesis, COPD, a-fib, HTN, TIA, SIADH, osteoporosis, migraine  Clinical Impression  Pt is up to move with PT after making several attempts to see her.  She is up to walk to BR and maneuver in standing with definite safety issues for being able to go home with family yet.  Her ability to stand and control her balance is reduced by her breathing issues, with mm weakness at the center of the issue.  O2 sats are sustained by O2 use, and will try to progress her gait distances as she can tolerate in this admission.    Follow Up Recommendations SNF    Equipment Recommendations  None recommended by PT    Recommendations for Other Services       Precautions / Restrictions Precautions Precautions: Fall (teleemtry) Restrictions Weight Bearing Restrictions: No      Mobility  Bed Mobility Overal bed mobility: Needs Assistance Bed Mobility: Supine to Sit;Sit to Supine     Supine to sit: Min assist;Mod assist Sit to supine: Mod assist      Transfers Overall transfer level: Needs assistance Equipment used: Rolling walker (2 wheeled);1 person hand held assist Transfers: Sit to/from UGI CorporationStand;Stand Pivot Transfers Sit to Stand: Mod assist Stand pivot transfers: Min assist;Mod assist       General transfer comment: needs close supervisoin with O2 line and direction of walker use,   Ambulation/Gait Ambulation/Gait assistance: Min assist Ambulation Distance (Feet): 40 Feet Assistive device: Rolling walker (2 wheeled);1 person hand held assist Gait Pattern/deviations: Step-through pattern;Decreased stride length;Trunk flexed;Wide base of support;Ataxic Gait velocity: reduced Gait velocity interpretation: Below normal speed for  age/gender General Gait Details: needs assistance with doorways and with O2 line, as pt is tending to trip on it otherwise  Stairs            Wheelchair Mobility    Modified Rankin (Stroke Patients Only)       Balance Overall balance assessment: Needs assistance Sitting-balance support: Feet supported Sitting balance-Leahy Scale: Fair     Standing balance support: Bilateral upper extremity supported Standing balance-Leahy Scale: Poor                               Pertinent Vitals/Pain Pain Assessment: No/denies pain    Home Living Family/patient expects to be discharged to:: Private residence Living Arrangements: Children Available Help at Discharge: Family Type of Home: House Home Access: Stairs to enter Entrance Stairs-Rails: None Entrance Stairs-Number of Steps: 11 Home Layout: Two level Home Equipment: Environmental consultantWalker - 2 wheels;Cane - single point      Prior Function Level of Independence: Independent with assistive device(s)               Hand Dominance        Extremity/Trunk Assessment   Upper Extremity Assessment: Generalized weakness           Lower Extremity Assessment: Generalized weakness      Cervical / Trunk Assessment: Kyphotic  Communication   Communication: HOH  Cognition Arousal/Alertness: Lethargic Behavior During Therapy: WFL for tasks assessed/performed Overall Cognitive Status: Within Functional Limits for tasks assessed  General Comments General comments (skin integrity, edema, etc.): Pt has already a flexed posture and requires close monitoring to control standing posture to manage trip to BR and help to get cleaned up after    Exercises     Assessment/Plan    PT Assessment Patient needs continued PT services  PT Problem List Decreased strength;Decreased range of motion;Decreased activity tolerance;Decreased balance;Decreased mobility;Decreased coordination;Decreased knowledge  of use of DME;Decreased safety awareness;Cardiopulmonary status limiting activity;Decreased skin integrity          PT Treatment Interventions DME instruction;Gait training;Functional mobility training;Therapeutic activities;Therapeutic exercise;Balance training;Neuromuscular re-education;Patient/family education    PT Goals (Current goals can be found in the Care Plan section)  Acute Rehab PT Goals Patient Stated Goal: to get walking again PT Goal Formulation: With patient Time For Goal Achievement: 05/16/16 Potential to Achieve Goals: Good    Frequency Min 2X/week   Barriers to discharge        Co-evaluation               End of Session Equipment Utilized During Treatment: Gait belt;Oxygen Activity Tolerance: Patient tolerated treatment well;Patient limited by fatigue;Treatment limited secondary to medical complications (Comment) Patient left: in bed;with call bell/phone within reach;with nursing/sitter in room Nurse Communication: Mobility status         Time: 8657-8469 PT Time Calculation (min) (ACUTE ONLY): 38 min   Charges:   PT Evaluation $PT Eval Moderate Complexity: 1 Procedure PT Treatments $Gait Training: 8-22 mins $Therapeutic Activity: 8-22 mins   PT G Codes:        Ivar Drape 05/18/2016, 4:47 PM    Samul Dada, PT MS Acute Rehab Dept. Number: [facility] [id number] and [facility] [phone]

## 2016-05-02 NOTE — Progress Notes (Signed)
Notified Dr.willis of low sodium level, no new orders.

## 2016-05-02 NOTE — Care Management Note (Signed)
Case Management Note  Patient Details  Name: Alyssa Landry MRN: [id number] Date of Birth: [birth year]  Subjective/Objective:    82yo Alyssa Landry resides at home with her daughter. Chronic 3L [place] at home. Admitted with CHF and left pleural effusion. Hx: COPD. Has home health. CM to f[facility]u with daughter [facility]                 Action/Plan:   Expected Discharge Date:  05/04/16               Expected Discharge Plan:     In-House Referral:     Discharge planning Services     Post Acute Care Choice:    Choice offered to:     DME Arranged:    DME Agency:     HH Arranged:    HH Agency:     Status of Service:     If discussed at [employer] Meetings, dates discussed:    Additional Comments:  Alyssa Bogard A, RN 05/02/2016, 9:12 AM

## 2016-05-02 NOTE — Progress Notes (Signed)
Sats are 100% on 3l. Family states she wears 2l at home, decreased to 2l as per home use.

## 2016-05-02 NOTE — Progress Notes (Signed)
[facility] [place] [facility]   PATIENT NAME: Alyssa Landry    MR#:  [id number]  DATE OF BIRTH:  [birth year]  SUBJECTIVE:  CHIEF COMPLAINT:  Patient is reporting cough. Shortness of breath is better after breathing treatment. Passive smoker. Daughter reports patient was started on levofloxacin course and still has to take 3 more pills in the next 3 days  REVIEW OF SYSTEMS:  CONSTITUTIONAL: No fever, fatigue or weakness.  EYES: No blurred or double vision.  EARS, NOSE, AND THROAT: No tinnitus or ear pain.  RESPIRATORY: Reports cough, improved shortness of breath, denies wheezing or hemoptysis.  CARDIOVASCULAR: No chest pain, orthopnea, edema.  GASTROINTESTINAL: No nausea, vomiting, diarrhea or abdominal pain.  GENITOURINARY: No dysuria, hematuria.  ENDOCRINE: No polyuria, nocturia,  HEMATOLOGY: No anemia, easy bruising or bleeding SKIN: No rash or lesion. MUSCULOSKELETAL: No joint pain or arthritis.   NEUROLOGIC: No tingling, numbness, weakness.  PSYCHIATRY: No anxiety or depression.   DRUG ALLERGIES:   Allergies  Allergen Reactions  . Aspirin Other (See Comments)    Upset stomach with high doses  . Codeine Other (See Comments)    Reaction:  Unknown   . Ditropan [Oxybutynin] Other (See Comments)    Reaction:  Unknown   . Hctz [Hydrochlorothiazide] Other (See Comments)    Reaction:  Causes pts sodium/potassium levels to drop significantly   . Metronidazole Other (See Comments)    Reaction:  Unknown   . Propranolol Other (See Comments)    Reaction:  Unknown   . Tavist [Clemastine] Other (See Comments)    Reaction:  Unknown   . Tramadol Other (See Comments)    Reaction:  Unknown   . Vicodin [Hydrocodone-Acetaminophen] Other (See Comments)    Reaction:  Unknown     VITALS:  Blood pressure (!) 163/61, pulse 69, temperature 97.5 F (36.4 C), temperature source Oral, resp. rate 16, height 4\' 9"  (1.448 m), weight 49.4 kg (108 lb 12.8 oz), SpO2  100 %.  PHYSICAL EXAMINATION:  GENERAL:  82 y.o.-year-old patient lying in the bed with no acute distress.  EYES: Pupils equal, round, reactive to light and accommodation. No scleral icterus. Extraocular muscles intact.  HEENT: Head atraumatic, normocephalic. Oropharynx and nasopharynx clear.  NECK:  Supple, no jugular venous distention. No thyroid enlargement, no tenderness.  LUNGS:   moderate breath sounds bilaterally, diminished at the right lung base , no wheezing, rales,rhonchi or crepitation. No use of accessory muscles of respiration.  CARDIOVASCULAR: S1, S2 normal. No murmurs, rubs, or gallops.  ABDOMEN: Soft, nontender, nondistended. Bowel sounds present. No organomegaly or mass.  EXTREMITIES: No pedal edema, cyanosis, or clubbing.  NEUROLOGIC: Cranial nerves II through XII are intact. Muscle strength at her baseline for her age  . Sensation intact. Gait not checked.  PSYCHIATRIC: The patient is alert and oriented x 3.  SKIN: No obvious rash, lesion, or ulcer.    LABORATORY PANEL:   CBC  Recent Labs Lab 05/01/16 1450  WBC 16.4*  HGB 10.4*  HCT 30.9*  PLT 383   ------------------------------------------------------------------------------------------------------------------  Chemistries   Recent Labs Lab 05/02/16 0757  NA 121*  120*  K 4.4  CL 73*  CO2 41*  GLUCOSE 100*  BUN 10  CREATININE 0.43*  CALCIUM 8.5*   ------------------------------------------------------------------------------------------------------------------  Cardiac Enzymes  Recent Labs Lab 05/01/16 1450  TROPONINI <0.03   ------------------------------------------------------------------------------------------------------------------  RADIOLOGY:  Dg Chest 1 View  Result Date: 05/01/2016 CLINICAL DATA:  Post thoracentesis EXAM: CHEST 1 VIEW COMPARISON:  Chest radiograph- 05/01/2016; 02/21/2016; 02/18/2016; chest CT- 02/21/2016 FINDINGS: Grossly unchanged enlarged cardiac silhouette  and mediastinal contours with atherosclerotic plaque within the thoracic aorta. Interval reduction/resolution of left-sided effusion post thoracentesis. No pneumothorax. Unchanged small right-sided effusion and associated right basilar opacities. Improved aeration of the left lower lung with minimal residual left basilar opacities favored to represent atelectasis. Mild cephalization of flow without frank evidence of edema. No acute osseus abnormalities. IMPRESSION: 1. Interval reduction/resolution of left-sided effusion post thoracentesis. No pneumothorax. 2. Unchanged small/trace right-sided effusion and associated right basilar opacities, likely atelectasis. 3. Cardiomegaly and pulmonary venous congestion without frank evidence of edema. 4.  Aortic Atherosclerosis (ICD10-170.0) Electronically Signed   By: Simonne ComeJohn  Watts M.D.   On: 05/01/2016 17:46   Dg Chest Portable 1 View  Result Date: 05/01/2016 CLINICAL DATA:  Labored breathing EXAM: PORTABLE CHEST 1 VIEW COMPARISON:  02/21/2016 FINDINGS: Cardiac shadow is enlarged. Increasing left-sided pleural effusion is now noted. A small right-sided pleural effusion is again seen. Mild congestive failure is noted without significant interstitial edema. IMPRESSION: Enlarging left-sided pleural effusion. Stable small right pleural effusion. Mild CHF. Electronically Signed   By: Alcide CleverMark  Lukens M.D.   On: 05/01/2016 15:19   [place] Thoracentesis Asp Pleural Space W/img Guide  Result Date: 05/01/2016 INDICATION: Symptomatic left sided pleural effusion EXAM: US THORACENTESIS ASP PLEURAL SPACE W/IMG GUIDE COMPARISON:  Chest radiograph - 05/01/2016; chest CT - 02/21/2016; left-sided thoracentesis - 02/21/2016 MEDICATIONS: None. COMPLICATIONS: None immediate. TECHNIQUE: Informed written consent was obtained from the patient after a discussion of the risks, benefits and alternatives to treatment. A timeout was performed prior to the initiation of the procedure. Initial ultrasound  scanning demonstrates a large anechoic left-sided pleural effusion. The lower chest was prepped and draped in the usual sterile fashion. 1% lidocaine was used for local anesthesia. An ultrasound image was saved for documentation purposes. An 8 Fr Safe-T-Centesis catheter was introduced. The thoracentesis was performed. The catheter was removed and a dressing was applied. The patient tolerated the procedure well without immediate post procedural complication. The patient was escorted to have an upright chest radiograph. FINDINGS: A total of approximately 750 cc of serous fluid was removed. IMPRESSION: Successful ultrasound-guided left sided thoracentesis yielding 750 cc of pleural fluid. Electronically Signed   By: Simonne ComeJohn  Watts M.D.   On: 05/01/2016 17:48    EKG:   Orders placed or performed during the hospital encounter of 05/01/16  . ED EKG  . 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  1. Acute on Chronic respiratory failure with hypoxia-secondary to CHF and left pleural effusion, recurrent  -Clinically doing better after left-sided thoracentesis. US guided left sided thoracentesis yielding 750 cc of serous pleural fluid 05/01/2016- -Patient is on chronic home oxygen - pt. Was treated for both conditions w/ IV Lasix  - duonebs, Pulm. Nebs.  -Monitor I's and O's, daily weight, sodium, potassium -Continue by mouth spironolactone -Resume aspirin and Plavix. -Appreciate cardiology recommendations  2. COPD exacerbation-mild.  -Resume her home antibiotic levofloxacin. 3 more tablets to finish the course daughter will bring from home -Patient is not currently wheezing. No indication for steroids - Pulmicort nebs, scheduled duonebs.  3. CHF-acute on  chronic diastolic dysfunction. - pt. will be diuresed with IV Lasix and  Is on tolvaptan  4.Acute  on chronic  Hyponatremia- acute secondary to hypervolemic hyponatremia chronic in nature and secondary to SIADH. -Continue diuretics -avoid  fluid restriction since patient is on tolvaptan -Mentating fine. Monitor serial sodiums  5. Essential hypertension-she will continue  Cardizem. resume clonidine 0.1 mg by mouth twice a day to prevent rebound hypertension, takes 0.1 mg 3 times a day at home  6. GERD-she will resume her Zantac.  PT consult for deconditioning  All the records are reviewed and case discussed with Care Management/Social Workerr. Management plans discussed with the patient, family and they are in agreement.  CODE STATUS: dnr   TOTAL TIME TAKING CARE OF THIS PATIENT: 36 minutes.   POSSIBLE D/C IN 2  DAYS, DEPENDING ON CLINICAL CONDITION.  Note: This dictation was prepared with Dragon dictation along with smaller phrase technology. Any transcriptional errors that result from this process are unintentional.   Ramonita Lab M.D on 05/02/2016 at 2:09 PM  Between 7am to 6pm - Pager - [phone] After 6pm go to [email or web address] - password [email or web address] [facility]  [facility]  Office  [phone]  CC: Primary care physician; Rafael Bihari, MD

## 2016-05-02 NOTE — Consult Note (Signed)
[facility] Card[facility] Consultation Note  Patient ID: Alyssa Landry, MRN: [id number], DOB/AGE: [birth year] 82 y.o. Admit date: 05/01/2016   Date of Consult: 05/02/2016 Primary Physician: Rafael Bihari, MD Primary Cardiologist: None  Chief Complaint:  Chief Complaint  Patient presents with  . Shortness of Breath   Reason for Consult: acute on chronic diastolic dysfunction heart failure  HPI: 82 y.o. female with known chronic diastolic dysfunction heart failure chronic hyponatremia essential hypertension with new onset severe shortness of breath weakness and fatigue and anemia for which she has had a progression of severe shortness of breath over the last several weeks. She was seen in the emergency room with worsening symptoms and unable to breathe well. She did have the history of chronic oxygen use due to pulmonary fibrosis which has also exacerbated this issue. She has an elevated white blood cell count of unknown etiology but currently does not have any significant apparent infection etiology. The patient has had a significant new left pleural effusion which has been him taken care of by thoracentesis now significantly improving her oxygenation. She does feel slightly better at this time. She does have anemia of unknown etiology as well for which she may need further treatment. The patient does have the chronic kidney disease also contributing to her diastolic dysfunction heart failure as well as atrial fibrillation currently with a reasonable heart rate control. Not had any anticoagulation due to concerns of significant progression of anemia and advanced age. Currently she has slightly better after treatment with intravenous Lasix and use of thoracentesis  Past Medical History:  Diagnosis Date  . A-fib (HCC)   . Acid reflux   . Anemia   . Anxiety   . Arthritis   . Asthma   . Chronic hyponatremia   . COPD (chronic obstructive pulmonary disease) (HCC)   . GERD  (gastroesophageal reflux disease)   . Hiatal hernia   . Hiatal hernia   . Hyperlipidemia   . Hypertension   . Hypothyroidism   . Incontinence   . Migraine   . Osteoporosis, post-menopausal   . Persistent cough   . Pulmonary fibrosis (HCC)   . SIADH (syndrome of inappropriate ADH production) (HCC)   . TIA (transient ischemic attack)       Surgical History:  Past Surgical History:  Procedure Laterality Date  . ABDOMINAL HYSTERECTOMY  1966     Home Meds: Prior to Admission medications   Medication Sig Start Date End Date Taking? Authorizing Provider  acetaminophen (TYLENOL) 650 MG CR tablet Take 650 mg by mouth every 8 (eight) hours as needed for pain.   Yes Historical Provider, MD  albuterol (PROVENTIL HFA;VENTOLIN HFA) 108 (90 BASE) MCG/ACT inhaler Inhale 1-2 puffs into the lungs every 4 (four) hours as needed for wheezing or shortness of breath.   Yes Historical Provider, MD  aspirin EC 81 MG tablet Take 81 mg by mouth every evening.    Yes Historical Provider, MD  cholecalciferol (VITAMIN D) 1000 UNITS tablet Take 1,000 Units by mouth daily.   Yes Historical Provider, MD  cloNIDine (CATAPRES) 0.1 MG tablet Take 0.1 mg by mouth 3 (three) times daily.   Yes Historical Provider, MD  clopidogrel (PLAVIX) 75 MG tablet Take 75 mg by mouth daily with breakfast.    Yes Historical Provider, MD  diltiazem (CARDIZEM CD) 120 MG 24 hr capsule Take 1 capsule (120 mg total) by mouth daily. Patient taking differently: Take 120 mg by mouth daily with lunch.  12/15/15  Yes Wyatt Haste, MD  docusate sodium (COLACE) 100 MG capsule Take 100 mg by mouth 2 (two) times daily.    Yes Historical Provider, MD  ferrous sulfate 325 (65 FE) MG tablet Take 325 mg by mouth daily.    Yes Historical Provider, MD  fluticasone (FLONASE) 50 MCG/ACT nasal spray Place 2 sprays into both nostrils every morning.    Yes Historical Provider, MD  fluticasone (FLOVENT HFA) 110 MCG/ACT inhaler Inhale 2 puffs into the lungs 2  (two) times daily. 8119,1478   Yes Historical Provider, MD  furosemide (LASIX) 20 MG tablet Take 1 tablet (20 mg total) by mouth daily. Patient taking differently: Take 20 mg by mouth daily. Take 2 tablets daily on M, W, F and 1 tablet daily on T, Thu, Sat, Sun 01/30/16  Yes Houston Siren, MD  levofloxacin (LEVAQUIN) 250 MG tablet Take 250 mg by mouth daily.   Yes Historical Provider, MD  levothyroxine (SYNTHROID, LEVOTHROID) 125 MCG tablet Take 125 mcg by mouth daily before breakfast.   Yes Historical Provider, MD  LORazepam (ATIVAN) 0.5 MG tablet Take 1 tablet (0.5 mg total) by mouth 3 (three) times daily. Patient taking differently: Take 0.5 mg by mouth 3 (three) times daily. 0600,1900,2100 02/25/16  Yes Houston Siren, MD  losartan (COZAAR) 50 MG tablet Take 50 mg by mouth daily.   Yes Historical Provider, MD  Multiple Vitamin (MULTIVITAMIN WITH MINERALS) TABS tablet Take 1 tablet by mouth daily.   Yes Historical Provider, MD  polyethylene glycol (MIRALAX / GLYCOLAX) packet Take 17 g by mouth daily. *Mix in 4-8 ounces of fluid prior to taking*   Yes Historical Provider, MD  ranitidine (ZANTAC) 150 MG tablet Take 150 mg by mouth 2 (two) times daily before a meal.    Yes Historical Provider, MD  sodium bicarbonate 325 MG tablet Take 325 mg by mouth 4 (four) times daily.   Yes Historical Provider, MD  spironolactone (ALDACTONE) 25 MG tablet Take 25 mg by mouth daily.   Yes Historical Provider, MD  tiotropium (SPIRIVA) 18 MCG inhalation capsule Place 18 mcg into inhaler and inhale daily.   Yes Historical Provider, MD  vitamin B-12 (CYANOCOBALAMIN) 1000 MCG tablet Take 1,000 mcg by mouth daily.   Yes Historical Provider, MD  feeding supplement, ENSURE ENLIVE, (ENSURE ENLIVE) LIQD Take 237 mLs by mouth daily.     Historical Provider, MD    Inpatient Medications:  . budesonide (PULMICORT) nebulizer solution  0.25 mg Nebulization BID  . Chlorhexidine Gluconate Cloth  6 each Topical Q0600  .  cholecalciferol  1,000 Units Oral Daily  . diltiazem  120 mg Oral Q lunch  . docusate sodium  100 mg Oral BID  . enoxaparin (LOVENOX) injection  30 mg Subcutaneous Q24H  . famotidine  20 mg Oral BID  . feeding supplement (ENSURE ENLIVE)  237 mL Oral Daily  . ferrous sulfate  325 mg Oral Daily  . fluticasone  2 spray Each Nare [id number]  . furosemide  20 mg Oral Daily  . levothyroxine  125 mcg Oral Q0600  . LORazepam  0.5 mg Oral TID  . losartan  50 mg Oral Daily  . multivitamin with minerals  1 tablet Oral Daily  . mupirocin ointment  1 application Nasal BID  . polyethylene glycol  17 g Oral Daily  . sodium bicarbonate  325 mg Oral QID  . sodium chloride flush  3 mL Intravenous Q12H  . spironolactone  25 mg Oral  Daily  . tiotropium  18 mcg Inhalation Daily  . tolvaptan  15 mg Oral Q24H  . vitamin B-12  1,000 mcg Oral Daily      Allergies:  Allergies  Allergen Reactions  . Aspirin Other (See Comments)    Upset stomach with high doses  . Codeine Other (See Comments)    Reaction:  Unknown   . Ditropan [Oxybutynin] Other (See Comments)    Reaction:  Unknown   . Hctz [Hydrochlorothiazide] Other (See Comments)    Reaction:  Causes pts sodium/potassium levels to drop significantly   . Metronidazole Other (See Comments)    Reaction:  Unknown   . Propranolol Other (See Comments)    Reaction:  Unknown   . Tavist [Clemastine] Other (See Comments)    Reaction:  Unknown   . Tramadol Other (See Comments)    Reaction:  Unknown   . Vicodin [Hydrocodone-Acetaminophen] Other (See Comments)    Reaction:  Unknown     Social History   Social History  . Marital status: Widowed    Spouse name: N/A  . Number of children: N/A  . Years of education: N/A   Occupational History  . Not on file.   Social History Main Topics  . Smoking status: Never Smoker  . Smokeless tobacco: Never Used  . Alcohol use No  . Drug use: No  . Sexual activity: Not on file   Other Topics Concern  . Not  on file   Social History Narrative  . No narrative on file     Family History  Problem Relation Age of Onset  . Leukemia Mother   . Heart disease Father   . Hypertension       Review of Systems Positive for Shortness of breath cough wheezing Negative for: General:  chills, fever, night sweats or weight changes.  Cardiovascular: PND orthopnea syncope dizziness  Dermatological skin lesions rashes Respiratory: Positive for Cough congestion Urologic: Frequent urination urination at night and hematuria Abdominal: negative for nausea, vomiting, diarrhea, bright red blood per rectum, melena, or hematemesis Neurologic: negative for visual changes, and/or hearing changes  All other systems reviewed and are otherwise negative except as noted above.  Labs:  Recent Labs  05/01/16 1450  TROPONINI <0.03   Lab Results  Component Value Date   WBC 16.4 (H) 05/01/2016   HGB 10.4 (L) 05/01/2016   HCT 30.9 (L) 05/01/2016   MCV 82.9 05/01/2016   PLT 383 05/01/2016    Recent Labs Lab 05/01/16 1935 05/02/16 0016  NA 120* 118*  K 4.1  --   CL 72*  --   CO2 41*  --   BUN 10  --   CREATININE 0.33*  --   CALCIUM 8.8*  --   GLUCOSE 126*  --    Lab Results  Component Value Date   CHOL 220 (H) 08/10/2011   HDL 106 (H) 08/10/2011   LDLCALC 99 08/10/2011   TRIG 73 08/10/2011   No results found for: DDIMER  Radiology/Studies:  Dg Chest 1 View  Result Date: 05/01/2016 CLINICAL DATA:  Post thoracentesis EXAM: CHEST 1 VIEW COMPARISON:  Chest radiograph- 05/01/2016; 02/21/2016; 02/18/2016; chest CT- 02/21/2016 FINDINGS: Grossly unchanged enlarged cardiac silhouette and mediastinal contours with atherosclerotic plaque within the thoracic aorta. Interval reduction/resolution of left-sided effusion post thoracentesis. No pneumothorax. Unchanged small right-sided effusion and associated right basilar opacities. Improved aeration of the left lower lung with minimal residual left basilar  opacities favored to represent atelectasis. Mild cephalization of  flow without frank evidence of edema. No acute osseus abnormalities. IMPRESSION: 1. Interval reduction/resolution of left-sided effusion post thoracentesis. No pneumothorax. 2. Unchanged small/trace right-sided effusion and associated right basilar opacities, likely atelectasis. 3. Cardiomegaly and pulmonary venous congestion without frank evidence of edema. 4.  Aortic Atherosclerosis (ICD10-170.0) Electronically Signed   By: Simonne Come M.D.   On: 05/01/2016 17:46   Dg Chest Portable 1 View  Result Date: 05/01/2016 CLINICAL DATA:  Labored breathing EXAM: PORTABLE CHEST 1 VIEW COMPARISON:  02/21/2016 FINDINGS: Cardiac shadow is enlarged. Increasing left-sided pleural effusion is now noted. A small right-sided pleural effusion is again seen. Mild congestive failure is noted without significant interstitial edema. IMPRESSION: Enlarging left-sided pleural effusion. Stable small right pleural effusion. Mild CHF. Electronically Signed   By: Alcide Clever M.D.   On: 05/01/2016 15:19   US Thoracentesis Asp Pleural Space W/img Guide  Result Date: 05/01/2016 INDICATION: Symptomatic left sided pleural effusion EXAM: US THORACENTESIS ASP PLEURAL SPACE W/IMG GUIDE COMPARISON:  Chest radiograph - 05/01/2016; chest CT - 02/21/2016; left-sided thoracentesis - 02/21/2016 MEDICATIONS: None. COMPLICATIONS: None immediate. TECHNIQUE: Informed written consent was obtained from the patient after a discussion of the risks, benefits and alternatives to treatment. A timeout was performed prior to the initiation of the procedure. Initial ultrasound scanning demonstrates a large anechoic left-sided pleural effusion. The lower chest was prepped and draped in the usual sterile fashion. 1% lidocaine was used for local anesthesia. An ultrasound image was saved for documentation purposes. An 8 Fr Safe-T-Centesis catheter was introduced. The thoracentesis was performed. The  catheter was removed and a dressing was applied. The patient tolerated the procedure well without immediate post procedural complication. The patient was escorted to have an upright chest radiograph. FINDINGS: A total of approximately 750 cc of serous fluid was removed. IMPRESSION: Successful ultrasound-guided left sided thoracentesis yielding 750 cc of pleural fluid. Electronically Signed   By: Simonne Come M.D.   On: 05/01/2016 17:48    EKG: Atrial fibrillation with controlled ventricular rate and nonspecific ST and T-wave changes  Weights: Filed Weights   05/01/16 1440 05/01/16 1441 05/02/16 0500  Weight: 53.5 kg (118 lb) 49.9 kg (110 lb) 49.4 kg (108 lb 12.8 oz)     Physical Exam: Blood pressure (!) 146/55, pulse 71, temperature 98.3 F (36.8 C), temperature source Oral, resp. rate 16, height 4\' 9"  (1.448 m), weight 49.4 kg (108 lb 12.8 oz), SpO2 100 %. Body mass index is 23.54 kg/m. General: Well developed, well nourished, in no acute distress. Head eyes ears nose throat: Normocephalic, atraumatic, sclera non-icteric, no xanthomas, nares are without discharge. No apparent thyromegaly and/or mass  Lungs: Normal respiratory effort.  Some wheezes, basilar rales, no rhonchi.  Heart: Irregular with normal S1 S2. Apical murmur gallop, no rub, PMI is normal size and placement, carotid upstroke normal without bruit, jugular venous pressure is normal Abdomen: Soft, non-tender, non-distended with normoactive bowel sounds. No hepatomegaly. No rebound/guarding. No obvious abdominal masses. Abdominal aorta is normal size without bruit Extremities: Trace to 1+ edema. no cyanosis, no clubbing, no ulcers  Peripheral : 2+ bilateral upper extremity pulses, 2+ bilateral femoral pulses, 2+ bilateral dorsal pedal pulse Neuro: Alert and oriented. No facial asymmetry. No focal deficit. Moves all extremities spontaneously. Musculoskeletal: Normal muscle tone with  kyphosis Psych:  Responds to questions  appropriately with a normal affect.    Assessment: 82 year old female with acute on chronic diastolic dysfunction congestive heart failure essential hypertension hyponatremia likely exacerbated by anemia, chronic kidney  disease, other dietary issues now slightly improved after intravenous Lasix and left thoracentesis  Plan: 1. Continue furosemide orally for maintenance of the good volume watching closely for hyponatremia which is a chronic problem 2. Addition of spironolactone which may help with diuresis as well 3. Further evaluation by echocardiogram to assess extent of diastolic dysfunction valvular heart disease and other causes of heart failure 4. Diltiazem for heart rate control of atrial fibrillation with a goal heart rate between 60 and 90 bpm 5. No anticoagulation for atrial fibrillation due to concerns of worsening anemia and heart failure 6. Further ambulation and adjustments of medication management as an outpatient  Signed, Lamar BlinksKOWALSKI,Kyng Matlock J M.D. [facility] 05/02/2016, 6:19 AM

## 2016-05-02 NOTE — Progress Notes (Signed)
Per family request that patient was on p.o  Levaquin at home before coming to the hospital. Attending was made aware during round and asked patient's daughter to bring the bottle. Family brought the medication bottle, Attending made aware, meds as prescribed x2 doses.

## 2016-05-03 LAB — BASIC METABOLIC PANEL
Anion gap: 6 (ref 5–15)
BUN: 11 mg/dL (ref 6–20)
CHLORIDE: 74 mmol/L — AB (ref 101–111)
CO2: 43 mmol/L — AB (ref 22–32)
CREATININE: 0.55 mg/dL (ref 0.44–1.00)
Calcium: 8.7 mg/dL — ABNORMAL LOW (ref 8.9–10.3)
GFR calc Af Amer: >60 mL/min (ref >60)
GFR calc non Af Amer: >60 mL/min (ref >60)
GLUCOSE: 103 mg/dL — AB (ref 65–99)
POTASSIUM: 4.4 mmol/L (ref 3.5–5.1)
SODIUM: 123 mmol/L — AB (ref 135–145)

## 2016-05-03 MED ORDER — LEVOFLOXACIN IN D5W 250 MG/50ML IV SOLN
250.0000 mg | INTRAVENOUS | Status: DC
  Filled 2016-05-03: qty 50

## 2016-05-03 MED ORDER — LEVOFLOXACIN IN D5W 500 MG/100ML IV SOLN
500.0000 mg | Freq: Once | INTRAVENOUS | Status: DC
  Filled 2016-05-03: qty 100

## 2016-05-03 NOTE — Care Management Important Message (Signed)
Important Message  Patient Details  Name: Alyssa Landry MRN: [id number] Date of Birth: 1919/06/29   Medicare Important Message Given:  Yes    Holiday Mcmenamin A, RN 05/03/2016, 2:20 PM

## 2016-05-03 NOTE — Care Management Important Message (Signed)
Important Message  Patient Details  Name: Alyssa Landry MRN: [id number] Date of Birth: December 27, 1918   Medicare Important Message Given:  Yes    [name], RN 05/03/2016, 9:07 AM

## 2016-05-03 NOTE — Progress Notes (Signed)
ANTIBIOTIC CONSULT NOTE - INITIAL  Pharmacy Consult for Levaquin  Indication: COPD exacerbation  Allergies  Allergen Reactions  . Aspirin Other (See Comments)    Upset stomach with high doses  . Codeine Other (See Comments)    Reaction:  Unknown   . Ditropan [Oxybutynin] Other (See Comments)    Reaction:  Unknown   . Hctz [Hydrochlorothiazide] Other (See Comments)    Reaction:  Causes pts sodium/potassium levels to drop significantly   . Metronidazole Other (See Comments)    Reaction:  Unknown   . Propranolol Other (See Comments)    Reaction:  Unknown   . Tavist [Clemastine] Other (See Comments)    Reaction:  Unknown   . Tramadol Other (See Comments)    Reaction:  Unknown   . Vicodin [Hydrocodone-Acetaminophen] Other (See Comments)    Reaction:  Unknown     Patient Measurements: Height: 4\' 9"  (144.8 cm) Weight: 107 lb 14.4 oz (48.9 kg) IBW/kg (Calculated) : 38.6 Adjusted Body Weight:   Vital Signs: Temp: 98.6 F (37 C) (10/22 0800) Temp Source: Oral (10/22 0800) BP: 113/79 (10/22 0800) Pulse Rate: 73 (10/22 0800) Intake/Output from previous day: 10/21 0701 - 10/22 0700 In: -  Out: 950 [Urine:950] Intake/Output from this shift: Total I/O In: -  Out: 800 [Urine:800]  Labs:  Recent Labs  05/01/16 1450 05/01/16 1935 05/02/16 0757 05/03/16 0621  WBC 16.4*  --   --   --   HGB 10.4*  --   --   --   PLT 383  --   --   --   CREATININE 0.42* 0.33* 0.43* 0.55   Estimated Creatinine Clearance: 28.4 mL/min (by C-G formula based on SCr of 0.55 mg/dL). No results for input(s): VANCOTROUGH, VANCOPEAK, VANCORANDOM, GENTTROUGH, GENTPEAK, GENTRANDOM, TOBRATROUGH, TOBRAPEAK, TOBRARND, AMIKACINPEAK, AMIKACINTROU, AMIKACIN in the last 72 hours.   Microbiology: Recent Results (from the past 720 hour(s))  MRSA PCR Screening     Status: Abnormal   Collection Time: 05/01/16  7:27 PM  Result Value Ref Range Status   MRSA by PCR POSITIVE (A) NEGATIVE Final    Comment:         The GeneXpert MRSA Assay (FDA approved for NASAL specimens only), is one component of a comprehensive MRSA colonization surveillance program. It is not intended to diagnose MRSA infection nor to guide or monitor treatment for MRSA infections. RESULT CALLED TO, READ BACK BY AND VERIFIED WITH: MARCEL TURNER AT 2042 05/01/16.PMH     Medical History: Past Medical History:  Diagnosis Date  . A-fib (HCC)   . Acid reflux   . Anemia   . Anxiety   . Arthritis   . Asthma   . Chronic hyponatremia   . COPD (chronic obstructive pulmonary disease) (HCC)   . GERD (gastroesophageal reflux disease)   . Hiatal hernia   . Hiatal hernia   . Hyperlipidemia   . Hypertension   . Hypothyroidism   . Incontinence   . Migraine   . Osteoporosis, post-menopausal   . Persistent cough   . Pulmonary fibrosis (HCC)   . SIADH (syndrome of inappropriate ADH production) (HCC)   . TIA (transient ischemic attack)     Medications:  Scheduled:  . aspirin EC  81 mg Oral Daily  . budesonide (PULMICORT) nebulizer solution  0.25 mg Nebulization BID  . Chlorhexidine Gluconate Cloth  6 each Topical Q0600  . cholecalciferol  1,000 Units Oral Daily  . cloNIDine  0.1 mg Oral BID  . clopidogrel  75 mg Oral Daily  . diltiazem  120 mg Oral Q lunch  . docusate sodium  100 mg Oral BID  . enoxaparin (LOVENOX) injection  30 mg Subcutaneous Q24H  . famotidine  20 mg Oral BID  . feeding supplement (ENSURE ENLIVE)  237 mL Oral Daily  . ferrous sulfate  325 mg Oral Daily  . fluticasone  2 spray Each Nare BH-q7a  . furosemide  20 mg Oral Daily  . levofloxacin (LEVAQUIN) IV  500 mg Intravenous Once   Followed by  . [START ON 05/04/2016] levofloxacin (LEVAQUIN) IV  250 mg Intravenous Q24H  . levofloxacin  250 mg Oral Daily  . levothyroxine  125 mcg Oral Q0600  . LORazepam  0.5 mg Oral TID  . losartan  50 mg Oral Daily  . multivitamin with minerals  1 tablet Oral Daily  . mupirocin ointment  1 application Nasal  BID  . polyethylene glycol  17 g Oral Daily  . sodium bicarbonate  325 mg Oral QID  . sodium chloride flush  3 mL Intravenous Q12H  . spironolactone  25 mg Oral Daily  . tiotropium  18 mcg Inhalation Daily  . tolvaptan  15 mg Oral Q24H  . vitamin B-12  1,000 mcg Oral Daily   Assessment: CrCl = 28.4 ml/min   Goal of Therapy:  resolution of infection  Plan:  Expected duration 7 days with resolution of temperature and/or normalization of WBC   Levaquin 500 mg IV X 1 to be given on 10/22 followed by levaquin 250 mg IV Q24H to start on 10/23.   Pharmacy will continue to follow and adjust per consult.   Rabecka Brendel D 05/03/2016,9:46 AM

## 2016-05-03 NOTE — Progress Notes (Signed)
[facility]  Patient: Allen Derrynnie C August / Admit Date: 05/01/2016 / Date of Encounter: 05/03/2016, 7:04 AM   Subjective: Patient resting comfortably at this time with no worsening of symptoms. Patient still mildly short of breath with ambulation edema improved  Review of Systems: Positive for: Shortness of breath weakness Negative for: Vision change, hearing change, syncope, dizziness, nausea, vomiting,diarrhea, bloody stool, stomach pain, cough, congestion, diaphoresis, urinary frequency, urinary pain,skin lesions, skin rashes Others previously listed  Objective: Telemetry: Atrial fibrillation with controlled ventricular rate Physical Exam: Blood pressure (!) 141/58, pulse 79, temperature 97.8 F (36.6 C), temperature source Oral, resp. rate 18, height 4\' 9"  (1.448 m), weight 48.9 kg (107 lb 14.4 oz), SpO2 99 %. Body mass index is 23.35 kg/m. General: Well developed, well nourished, in no acute distress. Head: Normocephalic, atraumatic, sclera non-icteric, no xanthomas, nares are without discharge. Neck: No apparent masses Lungs: Normal respirations with no wheezes, no rhonchi, basilar rales , basilar crackles   Heart: Irregular rate and rhythm, normal S1 S2, no murmur, no rub, no gallop, PMI is normal size and placement, carotid upstroke normal without bruit, jugular venous pressure normal Abdomen: Soft, non-tender, non-distended with normoactive bowel sounds. No hepatosplenomegaly. Abdominal aorta is normal size without bruit Extremities: Trace to 1+ edema, no clubbing, no cyanosis, no ulcers,  Peripheral: 2+ radial, 2+ femoral, 2+ dorsal pedal pulses Neuro: Alert and oriented. Moves all extremities spontaneously. Psych:  Responds to questions appropriately with a normal affect.   Intake/Output Summary (Last 24 hours) at 05/03/16 0704 Last data filed at 05/03/16 0439  Gross per 24 hour  Intake                0 ml  Output              950 ml   Net             -950 ml    Inpatient Medications:  . aspirin EC  81 mg Oral Daily  . budesonide (PULMICORT) nebulizer solution  0.25 mg Nebulization BID  . Chlorhexidine Gluconate Cloth  6 each Topical Q0600  . cholecalciferol  1,000 Units Oral Daily  . cloNIDine  0.1 mg Oral BID  . clopidogrel  75 mg Oral Daily  . diltiazem  120 mg Oral Q lunch  . docusate sodium  100 mg Oral BID  . enoxaparin (LOVENOX) injection  30 mg Subcutaneous Q24H  . famotidine  20 mg Oral BID  . feeding supplement (ENSURE ENLIVE)  237 mL Oral Daily  . ferrous sulfate  325 mg Oral Daily  . fluticasone  2 spray Each Nare BH-q7a  . furosemide  20 mg Oral Daily  . levofloxacin  250 mg Oral Daily  . levothyroxine  125 mcg Oral Q0600  . LORazepam  0.5 mg Oral TID  . losartan  50 mg Oral Daily  . multivitamin with minerals  1 tablet Oral Daily  . mupirocin ointment  1 application Nasal BID  . polyethylene glycol  17 g Oral Daily  . sodium bicarbonate  325 mg Oral QID  . sodium chloride flush  3 mL Intravenous Q12H  . spironolactone  25 mg Oral Daily  . tiotropium  18 mcg Inhalation Daily  . tolvaptan  15 mg Oral Q24H  . vitamin B-12  1,000 mcg Oral Daily   Infusions:    Labs:  Recent Labs  05/01/16 1935 05/02/16 0016 05/02/16 0757  NA 120* 118* 121*  120*  K  4.1  --  4.4  CL 72*  --  73*  CO2 41*  --  41*  GLUCOSE 126*  --  100*  BUN 10  --  10  CREATININE 0.33*  --  0.43*  CALCIUM 8.8*  --  8.5*   No results for input(s): AST, ALT, ALKPHOS, BILITOT, PROT, ALBUMIN in the last 72 hours.  Recent Labs  05/01/16 1450  WBC 16.4*  NEUTROABS 14.6*  HGB 10.4*  HCT 30.9*  MCV 82.9  PLT 383    Recent Labs  05/01/16 1450  TROPONINI <0.03   Invalid input(s): POCBNP No results for input(s): HGBA1C in the last 72 hours.   Weights: Filed Weights   05/01/16 1441 05/02/16 0500 05/03/16 0439  Weight: 49.9 kg (110 lb) 49.4 kg (108 lb 12.8 oz) 48.9 kg (107 lb 14.4 oz)      Radiology/Studies:  Dg Chest 1 View  Result Date: 05/01/2016 CLINICAL DATA:  Post thoracentesis EXAM: CHEST 1 VIEW COMPARISON:  Chest radiograph- 05/01/2016; 02/21/2016; 02/18/2016; chest CT- 02/21/2016 FINDINGS: Grossly unchanged enlarged cardiac silhouette and mediastinal contours with atherosclerotic plaque within the thoracic aorta. Interval reduction/resolution of left-sided effusion post thoracentesis. No pneumothorax. Unchanged small right-sided effusion and associated right basilar opacities. Improved aeration of the left lower lung with minimal residual left basilar opacities favored to represent atelectasis. Mild cephalization of flow without frank evidence of edema. No acute osseus abnormalities. IMPRESSION: 1. Interval reduction/resolution of left-sided effusion post thoracentesis. No pneumothorax. 2. Unchanged small/trace right-sided effusion and associated right basilar opacities, likely atelectasis. 3. Cardiomegaly and pulmonary venous congestion without frank evidence of edema. 4.  Aortic Atherosclerosis (ICD10-170.0) Electronically Signed   By: Simonne Come M.D.   On: 05/01/2016 17:46   Dg Chest Portable 1 View  Result Date: 05/01/2016 CLINICAL DATA:  Labored breathing EXAM: PORTABLE CHEST 1 VIEW COMPARISON:  02/21/2016 FINDINGS: Cardiac shadow is enlarged. Increasing left-sided pleural effusion is now noted. A small right-sided pleural effusion is again seen. Mild congestive failure is noted without significant interstitial edema. IMPRESSION: Enlarging left-sided pleural effusion. Stable small right pleural effusion. Mild CHF. Electronically Signed   By: Alcide Clever M.D.   On: 05/01/2016 15:19   US Thoracentesis Asp Pleural Space W/img Guide  Result Date: 05/01/2016 INDICATION: Symptomatic left sided pleural effusion EXAM: US THORACENTESIS ASP PLEURAL SPACE W/IMG GUIDE COMPARISON:  Chest radiograph - 05/01/2016; chest CT - 02/21/2016; left-sided thoracentesis - 02/21/2016  MEDICATIONS: None. COMPLICATIONS: None immediate. TECHNIQUE: Informed written consent was obtained from the patient after a discussion of the risks, benefits and alternatives to treatment. A timeout was performed prior to the initiation of the procedure. Initial ultrasound scanning demonstrates a large anechoic left-sided pleural effusion. The lower chest was prepped and draped in the usual sterile fashion. 1% lidocaine was used for local anesthesia. An ultrasound image was saved for documentation purposes. An 8 Fr Safe-T-Centesis catheter was introduced. The thoracentesis was performed. The catheter was removed and a dressing was applied. The patient tolerated the procedure well without immediate post procedural complication. The patient was escorted to have an upright chest radiograph. FINDINGS: A total of approximately 750 cc of serous fluid was removed. IMPRESSION: Successful ultrasound-guided left sided thoracentesis yielding 750 cc of pleural fluid. Electronically Signed   By: Simonne Come M.D.   On: 05/01/2016 17:48     Assessment and Recommendation  82 y.o. female with acute on chronic diastolic dysfunction congestive heart failure essential hypertension chronic hyponatremia and chronic nonvalvular atrial fibrillation with left  pleural effusion status post thoracentesis significantly improved at this time with unknown reason for elevated white blood cell count resting comfortably 1. Continue diltiazem for heart rate control of atrial fibrillation without change today but would follow closely for concerns of the bradycardia with addition of clonidine 2. No anticoagulation at this time due to concerns of significant bleeding complication 3. Furosemide for pleural effusion and pulmonary edema and lower extremity edema 4. No further cardiac diagnostics necessary at this time 5. Begin ambulation and follow for any further significant symptoms and need and adjustments of medication management 6. Further  consideration of rehabilitation and outside facility for cardiac and congestive heart failure rehabilitation  Signed, Arnoldo Hooker M.D. FACC

## 2016-05-03 NOTE — Progress Notes (Signed)
[facility] [place] [facility]   PATIENT NAME: Alyssa Landry    MR#:  [id number]  DATE OF BIRTH:  [birth year]  SUBJECTIVE:  CHIEF COMPLAINT:  Patient Has productive cough and bringing up clear phlegm . Shortness of breath is better after breathing treatment. Passive smoker.   REVIEW OF SYSTEMS:  CONSTITUTIONAL: No fever, fatigue or weakness.  EYES: No blurred or double vision.  EARS, NOSE, AND THROAT: No tinnitus or ear pain.  RESPIRATORY: Reports cough, improved shortness of breath, denies wheezing or hemoptysis.  CARDIOVASCULAR: No chest pain, orthopnea, edema.  GASTROINTESTINAL: No nausea, vomiting, diarrhea or abdominal pain.  GENITOURINARY: No dysuria, hematuria.  ENDOCRINE: No polyuria, nocturia,  HEMATOLOGY: No anemia, easy bruising or bleeding SKIN: No rash or lesion. MUSCULOSKELETAL: No joint pain or arthritis.   NEUROLOGIC: No tingling, numbness, weakness.  PSYCHIATRY: No anxiety or depression.   DRUG ALLERGIES:   Allergies  Allergen Reactions  . Aspirin Other (See Comments)    Upset stomach with high doses  . Codeine Other (See Comments)    Reaction:  Unknown   . Ditropan [Oxybutynin] Other (See Comments)    Reaction:  Unknown   . Hctz [Hydrochlorothiazide] Other (See Comments)    Reaction:  Causes pts sodium/potassium levels to drop significantly   . Metronidazole Other (See Comments)    Reaction:  Unknown   . Propranolol Other (See Comments)    Reaction:  Unknown   . Tavist [Clemastine] Other (See Comments)    Reaction:  Unknown   . Tramadol Other (See Comments)    Reaction:  Unknown   . Vicodin [Hydrocodone-Acetaminophen] Other (See Comments)    Reaction:  Unknown     VITALS:  Blood pressure (!) 101/41, pulse 81, temperature 98.2 F (36.8 C), temperature source Oral, resp. rate 18, height 4\' 9"  (1.448 m), weight 48.9 kg (107 lb 14.4 oz), SpO2 100 %.  PHYSICAL EXAMINATION:  GENERAL:  82 y.o.-year-old patient lying in the  bed with no acute distress.  EYES: Pupils equal, round, reactive to light and accommodation. No scleral icterus. Extraocular muscles intact.  HEENT: Head atraumatic, normocephalic. Oropharynx and nasopharynx clear.  NECK:  Supple, no jugular venous distention. No thyroid enlargement, no tenderness.  LUNGS:   moderate breath sounds bilaterally, diminished at the right lung base , no wheezing, rales,rhonchi or crepitation. No use of accessory muscles of respiration.  CARDIOVASCULAR: S1, S2 normal. No murmurs, rubs, or gallops.  ABDOMEN: Soft, nontender, nondistended. Bowel sounds present. No organomegaly or mass.  EXTREMITIES: No pedal edema, cyanosis, or clubbing.  NEUROLOGIC: Cranial nerves II through XII are intact. Muscle strength at her baseline for her age  . Sensation intact. Gait not checked.  PSYCHIATRIC: The patient is alert and oriented x 3.  SKIN: No obvious rash, lesion, or ulcer.    LABORATORY PANEL:   CBC  Recent Labs Lab 05/01/16 1450  WBC 16.4*  HGB 10.4*  HCT 30.9*  PLT 383   ------------------------------------------------------------------------------------------------------------------  Chemistries   Recent Labs Lab 05/03/16 0621  NA 123*  K 4.4  CL 74*  CO2 43*  GLUCOSE 103*  BUN 11  CREATININE 0.55  CALCIUM 8.7*   ------------------------------------------------------------------------------------------------------------------  Cardiac Enzymes  Recent Labs Lab 05/01/16 1450  TROPONINI <0.03   ------------------------------------------------------------------------------------------------------------------  RADIOLOGY:  Dg Chest 1 View  Result Date: 05/01/2016 CLINICAL DATA:  Post thoracentesis EXAM: CHEST 1 VIEW COMPARISON:  Chest radiograph- 05/01/2016; 02/21/2016; 02/18/2016; chest CT- 02/21/2016 FINDINGS: Grossly unchanged enlarged cardiac silhouette and mediastinal  contours with atherosclerotic plaque within the thoracic aorta. Interval  reduction/resolution of left-sided effusion post thoracentesis. No pneumothorax. Unchanged small right-sided effusion and associated right basilar opacities. Improved aeration of the left lower lung with minimal residual left basilar opacities favored to represent atelectasis. Mild cephalization of flow without frank evidence of edema. No acute osseus abnormalities. IMPRESSION: 1. Interval reduction/resolution of left-sided effusion post thoracentesis. No pneumothorax. 2. Unchanged small/trace right-sided effusion and associated right basilar opacities, likely atelectasis. 3. Cardiomegaly and pulmonary venous congestion without frank evidence of edema. 4.  Aortic Atherosclerosis (ICD10-170.0) Electronically Signed   By: Simonne Come M.D.   On: 05/01/2016 17:46   Dg Chest Portable 1 View  Result Date: 05/01/2016 CLINICAL DATA:  Labored breathing EXAM: PORTABLE CHEST 1 VIEW COMPARISON:  02/21/2016 FINDINGS: Cardiac shadow is enlarged. Increasing left-sided pleural effusion is now noted. A small right-sided pleural effusion is again seen. Mild congestive failure is noted without significant interstitial edema. IMPRESSION: Enlarging left-sided pleural effusion. Stable small right pleural effusion. Mild CHF. Electronically Signed   By: Alcide Clever M.D.   On: 05/01/2016 15:19   US Thoracentesis Asp Pleural Space W/img Guide  Result Date: 05/01/2016 INDICATION: Symptomatic left sided pleural effusion EXAM: US THORACENTESIS ASP PLEURAL SPACE W/IMG GUIDE COMPARISON:  Chest radiograph - 05/01/2016; chest CT - 02/21/2016; left-sided thoracentesis - 02/21/2016 MEDICATIONS: None. COMPLICATIONS: None immediate. TECHNIQUE: Informed written consent was obtained from the patient after a discussion of the risks, benefits and alternatives to treatment. A timeout was performed prior to the initiation of the procedure. Initial ultrasound scanning demonstrates a large anechoic left-sided pleural effusion. The lower chest was  prepped and draped in the usual sterile fashion. 1% lidocaine was used for local anesthesia. An ultrasound image was saved for documentation purposes. An 8 Fr Safe-T-Centesis catheter was introduced. The thoracentesis was performed. The catheter was removed and a dressing was applied. The patient tolerated the procedure well without immediate post procedural complication. The patient was escorted to have an upright chest radiograph. FINDINGS: A total of approximately 750 cc of serous fluid was removed. IMPRESSION: Successful ultrasound-guided left sided thoracentesis yielding 750 cc of pleural fluid. Electronically Signed   By: Simonne Come M.D.   On: 05/01/2016 17:48    EKG:   Orders placed or performed during the hospital encounter of 05/01/16  . ED EKG  . 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  1. Acute on Chronic respiratory failure with hypoxia-secondary to CHF and left pleural effusion, recurrent  -Intake and output--> 1200 NET output in the past 24 hours -Clinically doing better after left-sided thoracentesis.  US guided left sided thoracentesis yielding 750 cc of serous pleural fluid 05/01/2016- -Patient is on chronic home oxygen - pt. Was treated for both conditions w/ IV Lasix , currently on by mouth Lasix - duonebs, Pulm. Nebs.  -Monitor I's and O's, daily weight, sodium, potassium -Continue by mouth spironolactone -Resume aspirin and Plavix. -Appreciate cardiology recommendations  2. COPD exacerbation-mild.  -Resume her home antibiotic levofloxacin. 3 more tablets to finish the course daughter will bring from home -Patient is not currently wheezing. No indication for steroids - Pulmicort nebs, scheduled duonebs.  3. CHF-acute on  chronic diastolic dysfunction. - pt. will be diuresed with  Lasix and  Is on tolvaptan for  hyponatremia  4.Acute on chronic  Hyponatremia- acute secondary to hypervolemic hyponatremia chronic in nature and secondary to SIADH. -Continue diuretics -avoid  fluid restriction since patient is on tolvaptan -Mentating fine. Monitor serial sodiums  5. Essential hypertension-she will continue  Cardizem. resume clonidine 0.1 mg by mouth twice a day to prevent rebound hypertension, takes 0.1 mg 3 times a day at home  6. GERD-she will resume her Zantac.  PT -SNF-follow up with care manager All the records are reviewed and case discussed with Care Management/Social Workerr. Management plans discussed with the patient, family and they are in agreement.  CODE STATUS: dnr   TOTAL TIME TAKING CARE OF THIS PATIENT: 33 minutes.   POSSIBLE D/C IN 2  DAYS, DEPENDING ON CLINICAL CONDITION.  Note: This dictation was prepared with Dragon dictation along with smaller phrase technology. Any transcriptional errors that result from this process are unintentional.   Ramonita LabGouru, Abrar Koone M.D on 05/03/2016 at 12:59 PM  Between 7am to 6pm - Pager - [phone] After 6pm go to [email or web address] - password [email or web address] [facility]  [facility]  Office  [phone]  CC: Primary care physician; Rafael BihariWALKER III, JOHN B, MD

## 2016-05-04 LAB — CBC
HEMATOCRIT: 34.1 % — AB (ref 35.0–47.0)
HEMOGLOBIN: 11.2 g/dL — AB (ref 12.0–16.0)
MCH: 27.8 pg (ref 26.0–34.0)
MCHC: 32.9 g/dL (ref 32.0–36.0)
MCV: 84.4 fL (ref 80.0–100.0)
Platelets: 387 10*3/uL (ref 150–440)
RBC: 4.04 MIL/uL (ref 3.80–5.20)
RDW: 15.2 % — ABNORMAL HIGH (ref 11.5–14.5)
WBC: 13.5 10*3/uL — AB (ref 3.6–11.0)

## 2016-05-04 LAB — BASIC METABOLIC PANEL
ANION GAP: 7 (ref 5–15)
BUN: 8 mg/dL (ref 6–20)
CALCIUM: 8.6 mg/dL — AB (ref 8.9–10.3)
CO2: 41 mmol/L — ABNORMAL HIGH (ref 22–32)
Chloride: 80 mmol/L — ABNORMAL LOW (ref 101–111)
Creatinine, Ser: 0.52 mg/dL (ref 0.44–1.00)
GLUCOSE: 106 mg/dL — AB (ref 65–99)
POTASSIUM: 4.7 mmol/L (ref 3.5–5.1)
SODIUM: 128 mmol/L — AB (ref 135–145)

## 2016-05-04 MED ORDER — GUAIFENESIN-DM 100-10 MG/5ML PO SYRP: 10.0000 mL | ORAL_SOLUTION | Freq: Four times a day (QID) | ORAL | 0 refills | Status: DC | PRN

## 2016-05-04 MED ORDER — MUPIROCIN 2 % EX OINT: 1.0000 "application " | TOPICAL_OINTMENT | Freq: Two times a day (BID) | CUTANEOUS | 0 refills | Status: AC

## 2016-05-04 NOTE — Progress Notes (Signed)
ANTIBIOTIC CONSULT NOTE -follow up  Pharmacy Consult for Levaquin  Indication: COPD exacerbation  Allergies  Allergen Reactions  . Aspirin Other (See Comments)    Upset stomach with high doses  . Codeine Other (See Comments)    Reaction:  Unknown   . Ditropan [Oxybutynin] Other (See Comments)    Reaction:  Unknown   . Hctz [Hydrochlorothiazide] Other (See Comments)    Reaction:  Causes pts sodium/potassium levels to drop significantly   . Metronidazole Other (See Comments)    Reaction:  Unknown   . Propranolol Other (See Comments)    Reaction:  Unknown   . Tavist [Clemastine] Other (See Comments)    Reaction:  Unknown   . Tramadol Other (See Comments)    Reaction:  Unknown   . Vicodin [Hydrocodone-Acetaminophen] Other (See Comments)    Reaction:  Unknown     Patient Measurements: Height: 4\' 9"  (144.8 cm) Weight: 104 lb 9.6 oz (47.4 kg) IBW/kg (Calculated) : 38.6 Adjusted Body Weight:   Vital Signs: Temp: 98.2 F (36.8 C) (10/23 0445) BP: 160/51 (10/23 0445) Pulse Rate: 83 (10/23 0445) Intake/Output from previous day: 10/22 0701 - 10/23 0700 In: -  Out: 2500 [Urine:2500] Intake/Output from this shift: Total I/O In: -  Out: 800 [Urine:800]  Labs:  Recent Labs  05/01/16 1450  05/02/16 0757 05/03/16 0621 05/04/16 0425 05/04/16 0628  WBC 16.4*  --   --   --   --  13.5*  HGB 10.4*  --   --   --   --  11.2*  PLT 383  --   --   --   --  387  CREATININE 0.42*  < > 0.43* 0.55 0.52  --   < > = values in this interval not displayed. Estimated Creatinine Clearance: 28 mL/min (by C-G formula based on SCr of 0.52 mg/dL). No results for input(s): VANCOTROUGH, VANCOPEAK, VANCORANDOM, GENTTROUGH, GENTPEAK, GENTRANDOM, TOBRATROUGH, TOBRAPEAK, TOBRARND, AMIKACINPEAK, AMIKACINTROU, AMIKACIN in the last 72 hours.   Microbiology: Recent Results (from the past 720 hour(s))  MRSA PCR Screening     Status: Abnormal   Collection Time: 05/01/16  7:27 PM  Result Value Ref Range  Status   MRSA by PCR POSITIVE (A) NEGATIVE Final    Comment:        The GeneXpert MRSA Assay (FDA approved for NASAL specimens only), is one component of a comprehensive MRSA colonization surveillance program. It is not intended to diagnose MRSA infection nor to guide or monitor treatment for MRSA infections. RESULT CALLED TO, READ BACK BY AND VERIFIED WITH: MARCEL TURNER AT 2042 05/01/16.PMH     Medical History: Past Medical History:  Diagnosis Date  . A-fib (HCC)   . Acid reflux   . Anemia   . Anxiety   . Arthritis   . Asthma   . Chronic hyponatremia   . COPD (chronic obstructive pulmonary disease) (HCC)   . GERD (gastroesophageal reflux disease)   . Hiatal hernia   . Hiatal hernia   . Hyperlipidemia   . Hypertension   . Hypothyroidism   . Incontinence   . Migraine   . Osteoporosis, post-menopausal   . Persistent cough   . Pulmonary fibrosis (HCC)   . SIADH (syndrome of inappropriate ADH production) (HCC)   . TIA (transient ischemic attack)     Medications:  Scheduled:  . aspirin EC  81 mg Oral Daily  . budesonide (PULMICORT) nebulizer solution  0.25 mg Nebulization BID  . Chlorhexidine Gluconate Cloth  6 each Topical Q0600  . cholecalciferol  1,000 Units Oral Daily  . cloNIDine  0.1 mg Oral BID  . clopidogrel  75 mg Oral Daily  . diltiazem  120 mg Oral Q lunch  . docusate sodium  100 mg Oral BID  . enoxaparin (LOVENOX) injection  30 mg Subcutaneous Q24H  . famotidine  20 mg Oral BID  . feeding supplement (ENSURE ENLIVE)  237 mL Oral Daily  . ferrous sulfate  325 mg Oral Daily  . fluticasone  2 spray Each Nare [id number]  . furosemide  20 mg Oral Daily  . levofloxacin  250 mg Oral Daily  . levothyroxine  125 mcg Oral Q0600  . LORazepam  0.5 mg Oral TID  . losartan  50 mg Oral Daily  . multivitamin with minerals  1 tablet Oral Daily  . mupirocin ointment  1 application Nasal BID  . polyethylene glycol  17 g Oral Daily  . sodium bicarbonate  325 mg Oral  QID  . sodium chloride flush  3 mL Intravenous Q12H  . spironolactone  25 mg Oral Daily  . tiotropium  18 mcg Inhalation Daily  . tolvaptan  15 mg Oral Q24H  . vitamin B-12  1,000 mcg Oral Daily   Assessment: CrCl = 28 ml/min   Goal of Therapy:  resolution of infection  Plan:  Expected duration 7 days with resolution of temperature and/or normalization of WBC   10/22: Levaquin ordered as 250 mg po daily x 2 more doses. Levaquin IV order d/ced.   *Note: If Levaquin PO given with Ferrous Sulfate, Multivitamin or Ensure then it will NOT be absorbed *    [place] A 05/04/2016,8:39 AM

## 2016-05-04 NOTE — Clinical Social Work Placement (Addendum)
   CLINICAL SOCIAL WORK PLACEMENT  NOTE  Date:  05/04/2016  Patient Details  Name: Alyssa Landry MRN: [id number] Date of Birth: 15-May-1919  Clinical Social Work is seeking post-discharge placement for this patient at the Skilled  Nursing Facility level of care (*CSW will initial, date and re-position this form in  chart as items are completed):  Yes   Patient/family provided with [facility] Clinical Social Work Department's list of facilities offering this level of care within the geographic area requested by the patient (or if unable, by the patient's family).  Yes   Patient/family informed of their freedom to choose among providers that offer the needed level of care, that participate in Medicare, Medicaid or managed care program needed by the patient, have an available bed and are willing to accept the patient.      Patient/family informed of [facility]'s ownership interest in [facility] and [facility], as well as of the fact that they are under no obligation to receive care at these facilities.  PASRR submitted to EDS on 05/04/16     PASRR number received on       Existing PASRR number confirmed on 05/04/16     FL2 transmitted to all facilities in geographic area requested by pt/family on 05/04/16     FL2 transmitted to all facilities within larger geographic area on       Patient informed that his/her managed care company has contracts with or will negotiate with certain facilities, including the following:         05-04-16   Patient/family informed of bed offers received.  Patient chooses bed at  [facility]     Physician recommends and patient chooses bed at      Patient to be transferred to  [facility] on  05-04-16.  Patient to be transferred to facility by  [facility]     Patient family notified on  05-04-16 of transfer.  Name of family member notified:   [name] daughter    PHYSICIAN Please sign  FL2, Please sign DNR     Additional Comment:    _______________________________________________ Darleene CleaverAnterhaus, Leverne Tessler R 05/04/2016, 11:20 AM

## 2016-05-04 NOTE — Care Management Note (Signed)
Case Management Note  Patient Details  Name: Alyssa Landry MRN: [id number] Date of Birth: 08/31/1918  Subjective/Objective:      Message left on cell phone of daughter [name] to discuss plans for discharge today.               Action/Plan:   Expected Discharge Date:  05/04/16               Expected Discharge Plan:     In-House Referral:     Discharge planning Services     Post Acute Care Choice:    Choice offered to:     DME Arranged:    DME Agency:     HH Arranged:    HH Agency:     Status of Service:     If discussed at [employer] Meetings, dates discussed:    Additional Comments:  Aristides Luckey A, RN 05/04/2016, 9:55 AM

## 2016-05-04 NOTE — Discharge Instructions (Signed)
Continue home oxygen via nasal cannula 2-3 L Follow-up with primary care physician at the facility in 2-3 days Follow-up with cardiology-[facility] in 2 weeks Follow-up with outpatient CHF clinic in a week for cardiac rehabilitation Daily intake and output monitor daily weights

## 2016-05-04 NOTE — Clinical Social Work Note (Signed)
Patient to be d/c'ed today to [facility].  Patient and family agreeable to plans will transport via ems RN to call report room [address] [phone]  Windell MouldingEric Hazelle Woollard, MSW Mon-Fri 8a-4:30p [phone]

## 2016-05-04 NOTE — Clinical Social Work Note (Signed)
Clinical Social Work Assessment  Patient Details  Name: Allen Derrynnie C Delmar MRN: [id number] Date of Birth: [birth year]  Date of referral:  05/04/16               Reason for consult:  Facility Placement                Permission sought to share information with:  Facility Medical sales representativeContact Representative, Family Supports Permission granted to share information::  Yes, Verbal Permission Granted  Name::     UnalakleetHolland,Patricia Daughter [phone]  [phone]   Agency::  SNF admissions  Relationship::     Contact Information:     Housing/Transportation Living arrangements for the past 2 months:  Single Family Home Source of Information:  Patient, Adult Children Patient Interpreter Needed:  None Criminal Activity/Legal Involvement Pertinent to Current Situation/Hospitalization:  No - Comment as needed Significant Relationships:  Adult Children Lives with:  Adult Children Do you feel safe going back to the place where you live?  No Need for family participation in patient care:  Yes (Comment)  Care giving concerns:  Patient and family feel she needs some short term rehab before she is ableto go back home.  Social Worker assessment / plan:  Patient is a 82 year old female who lives with her daughter.  Patient is alert and oriented x4 but hard of hearing.  Patient states she has been to rehab in the past, and was just recently discharged from SNF in September.  Patient's daughter was at bedside and states she is familiar with the process for snf placement.  Patient and daughter were explained how insurance will pay for her stay at SNF and explained to them what to expect.  Patient and daughter gave MSW permission to fax out to [facility].  Patient and daughter did not express any other concerns or issues.  Employment status:  Retired Database administratornsurance information:  Managed Medicare PT Recommendations:  Skilled Nursing Facility Information / Referral to community resources:  Skilled Nursing  Facility  Patient/Family's Response to care:  Patient and family in agreement to having patient go to SNF for short term rehab.  Patient/Family's Understanding of and Emotional Response to Diagnosis, Current Treatment, and Prognosis:  Patient and daughter are aware of current diagnosis and treatment plan.  Patient and daughter are hopeful she will not have to be in SNF for a long period of time.  Emotional Assessment Appearance:  Appears stated age Attitude/Demeanor/Rapport:    Affect (typically observed):  Appropriate, Calm Orientation:  Oriented to Situation, Oriented to  Time, Oriented to Place, Oriented to Self Alcohol / Substance use:  Not Applicable Psych involvement (Current and /or in the community):  No (Comment)  Discharge Needs  Concerns to be addressed:  Lack of Support Readmission within the last 30 days:  No Current discharge risk:  None Barriers to Discharge:  No Barriers Identified   Darleene Cleavernterhaus, Craige Patel R 05/04/2016, 11:15 AM

## 2016-05-04 NOTE — Progress Notes (Signed)
Pt to be discharged to [facility] today. Iv and tele removed. Report called to kim at the facility. pt to be transported via e.m.s.

## 2016-05-04 NOTE — Discharge Summary (Signed)
[facility] - [place] at [facility]   PATIENT NAME: Alyssa Landry    MR#:  [id number]  DATE OF BIRTH:  09/06/1918  DATE OF ADMISSION:  05/01/2016 ADMITTING PHYSICIAN: Enedina Finner, MD  DATE OF DISCHARGE: 05/04/16 PRIMARY CARE PHYSICIAN: Rafael Bihari, MD    ADMISSION DIAGNOSIS:  Hyponatremia [E87.1] SOB (shortness of breath) [R06.02] COPD exacerbation (HCC) [J44.1] Status post thoracentesis [Z98.890] Acute on chronic congestive heart failure, unspecified congestive heart failure type (HCC) [I50.9]  DISCHARGE DIAGNOSIS:  Acute on chronic hypoxic respiratory failure continue  Oxygen Acute on chronic diastolic CHF Mild COPD exacerbation  SECONDARY DIAGNOSIS:   Past Medical History:  Diagnosis Date  . A-fib (HCC)   . Acid reflux   . Anemia   . Anxiety   . Arthritis   . Asthma   . Chronic hyponatremia   . COPD (chronic obstructive pulmonary disease) (HCC)   . GERD (gastroesophageal reflux disease)   . Hiatal hernia   . Hiatal hernia   . Hyperlipidemia   . Hypertension   . Hypothyroidism   . Incontinence   . Migraine   . Osteoporosis, post-menopausal   . Persistent cough   . Pulmonary fibrosis (HCC)   . SIADH (syndrome of inappropriate ADH production) (HCC)   . TIA (transient ischemic attack)     HOSPITAL COURSE:   82 year old female with past medical history of hypertension, hyperlipidemia, hypothyroidism, osteoporosis, history of pulmonary fibrosis, chronic hyponatremia secondary to SIADH, GERD, who presented to the hospital due to shortness of breath.  1. Acute on Chronic respiratory failure with hypoxia-secondary to CHF andleft pleural effusion, recurrent  -Intake and output--> 4800 NET output in the past 24 hours -Clinically doing better after left-sided thoracentesis. US guided leftsided thoracentesis yielding 750 ccof serouspleural fluid 05/01/2016- -Patient is on chronic home oxygen - pt. Was treated for both conditions  w/ IV Lasix , currently on by mouth Lasix -duonebs, Pulm. Nebs.  -Monitor I's and O's, daily weight, sodium, potassium -Continue by mouth spironolactone -Resume aspirin and Plavix. -Appreciate cardiology recommendations, op cardio and chf clinic  2. COPD exacerbation-mild. Resolved -Resumed her home antibiotic levofloxacin -. No indication for steroids -Pulmicort nebs, scheduled duonebs.  3. CHF-acute on chronic diastolic dysfunction. - Clinically improved ,pt diuresed with  Lasix   Given tolvaptan for hyponatremia, sodium at her baseline 128  4.Acute on chronic  Hyponatremia- acute secondary to hypervolemic hyponatremia chronic in nature and secondary to SIADH. Baseline sodium is at 128. Patient sodium at 128 today Tolvaptan given during the hospital course -Continue diuretics -fluid restriction -Mentating fine.   5. Essential hypertension-she will continue Cardizem. resume clonidine 0.1 mg by mouth twice a day to prevent rebound hypertension, takes 0.1 mg 3 times a day at home  6. GERD-she will resume her Zantac.  PT -SNF-  DISCHARGE CONDITIONS:   fair  CONSULTS OBTAINED:  Treatment Team:  Lamar Blinks, MD Ramonita Lab, MD   PROCEDURES Left-sided thoracentesis  DRUG ALLERGIES:   Allergies  Allergen Reactions  . Aspirin Other (See Comments)    Upset stomach with high doses  . Codeine Other (See Comments)    Reaction:  Unknown   . Ditropan [Oxybutynin] Other (See Comments)    Reaction:  Unknown   . Hctz [Hydrochlorothiazide] Other (See Comments)    Reaction:  Causes pts sodium/potassium levels to drop significantly   . Metronidazole Other (See Comments)    Reaction:  Unknown   . Propranolol Other (See Comments)  Reaction:  Unknown   . Tavist [Clemastine] Other (See Comments)    Reaction:  Unknown   . Tramadol Other (See Comments)    Reaction:  Unknown   . Vicodin [Hydrocodone-Acetaminophen] Other (See Comments)    Reaction:  Unknown      DISCHARGE MEDICATIONS:   Current Discharge Medication List    START taking these medications   Details  guaiFENesin-dextromethorphan (ROBITUSSIN DM) 100-10 MG/5ML syrup Take 10 mLs by mouth every 6 (six) hours as needed for cough. Qty: 118 mL, Refills: 0    mupirocin ointment (BACTROBAN) 2 % Place 1 application into the nose 2 (two) times daily. Qty: 22 g, Refills: 0      CONTINUE these medications which have NOT CHANGED   Details  acetaminophen (TYLENOL) 650 MG CR tablet Take 650 mg by mouth every 8 (eight) hours as needed for pain.    albuterol (PROVENTIL HFA;VENTOLIN HFA) 108 (90 BASE) MCG/ACT inhaler Inhale 1-2 puffs into the lungs every 4 (four) hours as needed for wheezing or shortness of breath.    aspirin EC 81 MG tablet Take 81 mg by mouth every evening.     cholecalciferol (VITAMIN D) 1000 UNITS tablet Take 1,000 Units by mouth daily.    cloNIDine (CATAPRES) 0.1 MG tablet Take 0.1 mg by mouth 3 (three) times daily.    clopidogrel (PLAVIX) 75 MG tablet Take 75 mg by mouth daily with breakfast.     diltiazem (CARDIZEM CD) 120 MG 24 hr capsule Take 1 capsule (120 mg total) by mouth daily. Qty: 30 capsule, Refills: 0    docusate sodium (COLACE) 100 MG capsule Take 100 mg by mouth 2 (two) times daily.     ferrous sulfate 325 (65 FE) MG tablet Take 325 mg by mouth daily.     fluticasone (FLONASE) 50 MCG/ACT nasal spray Place 2 sprays into both nostrils every morning.     fluticasone (FLOVENT HFA) 110 MCG/ACT inhaler Inhale 2 puffs into the lungs 2 (two) times daily. 0930,1700    furosemide (LASIX) 20 MG tablet Take 1 tablet (20 mg total) by mouth daily. Qty: 30 tablet, Refills: 1    levothyroxine (SYNTHROID, LEVOTHROID) 125 MCG tablet Take 125 mcg by mouth daily before breakfast.    LORazepam (ATIVAN) 0.5 MG tablet Take 1 tablet (0.5 mg total) by mouth 3 (three) times daily. Qty: 30 tablet, Refills: 0    losartan (COZAAR) 50 MG tablet Take 50 mg by mouth daily.     Multiple Vitamin (MULTIVITAMIN WITH MINERALS) TABS tablet Take 1 tablet by mouth daily.    polyethylene glycol (MIRALAX / GLYCOLAX) packet Take 17 g by mouth daily. *Mix in 4-8 ounces of fluid prior to taking*    ranitidine (ZANTAC) 150 MG tablet Take 150 mg by mouth 2 (two) times daily before a meal.     sodium bicarbonate 325 MG tablet Take 325 mg by mouth 4 (four) times daily.    spironolactone (ALDACTONE) 25 MG tablet Take 25 mg by mouth daily.    tiotropium (SPIRIVA) 18 MCG inhalation capsule Place 18 mcg into inhaler and inhale daily.    vitamin B-12 (CYANOCOBALAMIN) 1000 MCG tablet Take 1,000 mcg by mouth daily.    feeding supplement, ENSURE ENLIVE, (ENSURE ENLIVE) LIQD Take 237 mLs by mouth daily.       STOP taking these medications     levofloxacin (LEVAQUIN) 250 MG tablet          DISCHARGE INSTRUCTIONS:   Continue home oxygen via nasal  cannula 2-3 L Follow-up with primary care physician at the facility in 2-3 days Follow-up with cardiology-kc in 2 weeks Follow-up with outpatient CHF clinic in a week for cardiac rehabilitation Daily intake and output monitor daily weights   DIET:  Cardiac diet  DISCHARGE CONDITION:  Fair  ACTIVITY:  Activity as tolerated  OXYGEN:  Home Oxygen: Yes.     Oxygen Delivery: 2-3 liters/min via Patient connected to nasal cannula oxygen  DISCHARGE LOCATION:  nursing home   If you experience worsening of your admission symptoms, develop shortness of breath, life threatening emergency, suicidal or homicidal thoughts you must seek medical attention immediately by calling 911 or calling your MD immediately  if symptoms less severe.  You Must read complete instructions/literature along with all the possible adverse reactions/side effects for all the Medicines you take and that have been prescribed to you. Take any new Medicines after you have completely understood and accpet all the possible adverse reactions/side effects.    Please note  You were cared for by a hospitalist during your hospital stay. If you have any questions about your discharge medications or the care you received while you were in the hospital after you are discharged, you can call the unit and asked to speak with the hospitalist on call if the hospitalist that took care of you is not available. Once you are discharged, your primary care physician will handle any further medical issues. Please note that NO REFILLS for any discharge medications will be authorized once you are discharged, as it is imperative that you return to your primary care physician (or establish a relationship with a primary care physician if you do not have one) for your aftercare needs so that they can reassess your need for medications and monitor your lab values.     Today  Chief Complaint  Patient presents with  . Shortness of Breath   Patient is doing fine. Cough is better. Feels comfortable to be discharged to skilled nursing care. Daughter is agreeable.  ROS:  CONSTITUTIONAL: Denies fevers, chills. Denies any fatigue, weakness.  EYES: Denies blurry vision, double vision, eye pain. EARS, NOSE, THROAT: Denies tinnitus, ear pain, hearing loss. RESPIRATORY: Denies cough, wheeze, shortness of breath.  CARDIOVASCULAR: Denies chest pain, palpitations, edema.  GASTROINTESTINAL: Denies nausea, vomiting, diarrhea, abdominal pain. Denies bright red blood per rectum. GENITOURINARY: Denies dysuria, hematuria. ENDOCRINE: Denies nocturia or thyroid problems. HEMATOLOGIC AND LYMPHATIC: Denies easy bruising or bleeding. SKIN: Denies rash or lesion. MUSCULOSKELETAL: Denies pain in neck, back, shoulder, knees, hips or arthritic symptoms.  NEUROLOGIC: Denies paralysis, paresthesias.  PSYCHIATRIC: Denies anxiety or depressive symptoms.   VITAL SIGNS:  Blood pressure (!) 159/63, pulse 84, temperature 98.4 F (36.9 C), temperature source Axillary, resp. rate 18, height 4\' 9"   (1.448 m), weight 47.4 kg (104 lb 9.6 oz), SpO2 100 %.  I/O:    Intake/Output Summary (Last 24 hours) at 05/04/16 1409 Last data filed at 05/04/16 1011  Gross per 24 hour  Intake                0 ml  Output             2200 ml  Net            -2200 ml    PHYSICAL EXAMINATION:  GENERAL:  82 y.o.-year-old patient lying in the bed with no acute distress.  EYES: Pupils equal, round, reactive to light and accommodation. No scleral icterus. Extraocular muscles intact.  HEENT: Head atraumatic,  normocephalic. Oropharynx and nasopharynx clear.  NECK:  Supple, no jugular venous distention. No thyroid enlargement, no tenderness.  LUNGS: Normal breath sounds bilaterally, no wheezing, rales,rhonchi or crepitation. No use of accessory muscles of respiration.  CARDIOVASCULAR: S1, S2 normal. No murmurs, rubs, or gallops.  ABDOMEN: Soft, non-tender, non-distended. Bowel sounds present. No organomegaly or mass.  EXTREMITIES: No pedal edema, cyanosis, or clubbing.  NEUROLOGIC: Cranial nerves II through XII are intact. Muscle strength 5/5 in all extremities. Sensation intact. Gait not checked.  PSYCHIATRIC: The patient is alert and oriented x 3.  SKIN: No obvious rash, lesion, or ulcer.   DATA REVIEW:   CBC  Recent Labs Lab 05/04/16 0628  WBC 13.5*  HGB 11.2*  HCT 34.1*  PLT 387    Chemistries   Recent Labs Lab 05/04/16 0425  NA 128*  K 4.7  CL 80*  CO2 41*  GLUCOSE 106*  BUN 8  CREATININE 0.52  CALCIUM 8.6*    Cardiac Enzymes  Recent Labs Lab 05/01/16 1450  TROPONINI <0.03    Microbiology Results  Results for orders placed or performed during the hospital encounter of 05/01/16  MRSA PCR Screening     Status: Abnormal   Collection Time: 05/01/16  7:27 PM  Result Value Ref Range Status   MRSA by PCR POSITIVE (A) NEGATIVE Final    Comment:        The GeneXpert MRSA Assay (FDA approved for NASAL specimens only), is one component of a comprehensive MRSA  colonization surveillance program. It is not intended to diagnose MRSA infection nor to guide or monitor treatment for MRSA infections. RESULT CALLED TO, READ BACK BY AND VERIFIED WITH: MARCEL TURNER AT 2042 05/01/16.PMH     RADIOLOGY:  Dg Chest 1 View  Result Date: 05/01/2016 CLINICAL DATA:  Post thoracentesis EXAM: CHEST 1 VIEW COMPARISON:  Chest radiograph- 05/01/2016; 02/21/2016; 02/18/2016; chest CT- 02/21/2016 FINDINGS: Grossly unchanged enlarged cardiac silhouette and mediastinal contours with atherosclerotic plaque within the thoracic aorta. Interval reduction/resolution of left-sided effusion post thoracentesis. No pneumothorax. Unchanged small right-sided effusion and associated right basilar opacities. Improved aeration of the left lower lung with minimal residual left basilar opacities favored to represent atelectasis. Mild cephalization of flow without frank evidence of edema. No acute osseus abnormalities. IMPRESSION: 1. Interval reduction/resolution of left-sided effusion post thoracentesis. No pneumothorax. 2. Unchanged small/trace right-sided effusion and associated right basilar opacities, likely atelectasis. 3. Cardiomegaly and pulmonary venous congestion without frank evidence of edema. 4.  Aortic Atherosclerosis (ICD10-170.0) Electronically Signed   By: Simonne Come M.D.   On: 05/01/2016 17:46   Dg Chest Portable 1 View  Result Date: 05/01/2016 CLINICAL DATA:  Labored breathing EXAM: PORTABLE CHEST 1 VIEW COMPARISON:  02/21/2016 FINDINGS: Cardiac shadow is enlarged. Increasing left-sided pleural effusion is now noted. A small right-sided pleural effusion is again seen. Mild congestive failure is noted without significant interstitial edema. IMPRESSION: Enlarging left-sided pleural effusion. Stable small right pleural effusion. Mild CHF. Electronically Signed   By: Alcide Clever M.D.   On: 05/01/2016 15:19   US Thoracentesis Asp Pleural Space W/img Guide  Result Date:  05/01/2016 INDICATION: Symptomatic left sided pleural effusion EXAM: US THORACENTESIS ASP PLEURAL SPACE W/IMG GUIDE COMPARISON:  Chest radiograph - 05/01/2016; chest CT - 02/21/2016; left-sided thoracentesis - 02/21/2016 MEDICATIONS: None. COMPLICATIONS: None immediate. TECHNIQUE: Informed written consent was obtained from the patient after a discussion of the risks, benefits and alternatives to treatment. A timeout was performed prior to the initiation of the procedure. Initial  ultrasound scanning demonstrates a large anechoic left-sided pleural effusion. The lower chest was prepped and draped in the usual sterile fashion. 1% lidocaine was used for local anesthesia. An ultrasound image was saved for documentation purposes. An 8 Fr Safe-T-Centesis catheter was introduced. The thoracentesis was performed. The catheter was removed and a dressing was applied. The patient tolerated the procedure well without immediate post procedural complication. The patient was escorted to have an upright chest radiograph. FINDINGS: A total of approximately 750 cc of serous fluid was removed. IMPRESSION: Successful ultrasound-guided left sided thoracentesis yielding 750 cc of pleural fluid. Electronically Signed   By: Simonne ComeJohn  Watts M.D.   On: 05/01/2016 17:48    EKG:   Orders placed or performed during the hospital encounter of 05/01/16  . ED EKG  . ED EKG      Management plans discussed with the patient, family and they are in agreement.  CODE STATUS:     Code Status Orders        Start     Ordered   05/01/16 1849  Do not attempt resuscitation (DNR)  Continuous    Question Answer Comment  In the event of cardiac or respiratory ARREST Do not call a "code blue"   In the event of cardiac or respiratory ARREST Do not perform Intubation, CPR, defibrillation or ACLS   In the event of cardiac or respiratory ARREST Use medication by any route, position, wound care, and other measures to relive pain and suffering. May  use oxygen, suction and manual treatment of airway obstruction as needed for comfort.      05/01/16 1848    Code Status History    Date Active Date Inactive Code Status Order ID Comments User Context   02/18/2016 11:10 PM 02/25/2016  6:31 PM DNR [id number]  Tonye RoyaltyAlexis Hugelmeyer, DO Inpatient   01/26/2016  9:56 PM 01/30/2016  7:34 PM DNR [id number]  Houston SirenVivek J Sainani, MD Inpatient   12/11/2015  9:29 PM 12/15/2015  8:13 PM DNR [id number]  Ramonita LabAruna Angeleah Labrake, MD Inpatient   09/25/2015  4:17 PM 09/26/2015 10:07 PM DNR [id number]  Altamese DillingVaibhavkumar Vachhani, MD ED    Advance Directive Documentation   Flowsheet Row Most Recent Value  Type of Advance Directive  Healthcare Power of Attorney  Pre-existing out of facility DNR order (yellow form or pink MOST form)  No data  "MOST" Form in Place?  No data      TOTAL TIME TAKING CARE OF THIS PATIENT: 45  minutes.   Note: This dictation was prepared with Dragon dictation along with smaller phrase technology. Any transcriptional errors that result from this process are unintentional.   @[facility] @  on 05/04/2016 at 2:09 PM  Between 7am to 6pm - Pager - [phone]  After 6pm go to [email or web address] - password [email or web address] [facility]  [facility]  Office  [phone]  CC: Primary care physician; Rafael BihariWALKER III, JOHN B, MD

## 2016-05-04 NOTE — [place] FL2 (Signed)
[place] MEDICAID FL2 LEVEL OF CARE SCREENING TOOL     IDENTIFICATION  Patient Name: Alyssa Landry Birthdate: [birth year] Sex: female Admission Date (Current Location): 05/01/2016  [place] and [place] Number:  Chiropodist and Address:  [facility], [address], [place], [place] [address]      Provider Number: [id number]  Attending Physician Name and Address:  Ramonita Lab, MD  Relative Name and Phone Number:  Tommie Ard Daughter [phone] or [phone]     Current Level of Care: Hospital Recommended Level of Care: Skilled Nursing Facility Prior Approval Number:    Date Approved/Denied:   PASRR Number: [id number]  Discharge Plan: SNF    Current Diagnoses: Patient Active Problem List   Diagnosis Date Noted  . CHF (congestive heart failure) (HCC) 05/01/2016  . Chronic diastolic heart failure (HCC) 04/16/2016  . HTN (hypertension) 04/16/2016  . Hypoxia   . SOB (shortness of breath)   . Collapse of left lung   . Pleural effusion   . Centrilobular emphysema (HCC)   . Hyponatremia   . Pressure ulcer 02/19/2016  . Atrial fibrillation with RVR (HCC) 12/11/2015  . Urge incontinence 09/26/2015  . Lichenified rash 09/26/2015  . Syncopal episodes 09/25/2015    Orientation RESPIRATION BLADDER Height & Weight     Self, Time, Situation, Place  O2 (2L) Continent Weight: 104 lb 9.6 oz (47.4 kg) Height:  4\' 9"  (144.8 cm)  BEHAVIORAL SYMPTOMS/MOOD NEUROLOGICAL BOWEL NUTRITION STATUS      Continent Diet (2g sodium diet)  AMBULATORY STATUS COMMUNICATION OF NEEDS Skin   Limited Assist Verbally Normal                       Personal Care Assistance Level of Assistance  Bathing, Dressing Bathing Assistance: Limited assistance   Dressing Assistance: Limited assistance     Functional Limitations Info  Sight, Hearing, Speech Sight Info: Impaired Hearing Info: Adequate Speech Info: Adequate    SPECIAL CARE FACTORS  FREQUENCY  PT (By licensed PT)     PT Frequency: 5x a week              Contractures Contractures Info: Not present    Additional Factors Info  Code Status, Allergies Code Status Info: DNR Allergies Info: ASPIRIN, CODEINE, DITROPAN OXYBUTYNIN, HCTZ HYDROCHLOROTHIAZIDE, METRONIDAZOLE, PROPRANOLOL, TAVIST CLEMASTINE, TRAMADOL, VICODIN HYDROCODONE-ACETAMINOPHEN           Current Medications (05/04/2016):  This is the current hospital active medication list Current Facility-Administered Medications  Medication Dose Route Frequency Provider Last Rate Last Dose  . 0.9 %  sodium chloride infusion  250 mL Intravenous PRN Enedina Finner, MD      . acetaminophen (TYLENOL) tablet 650 mg  650 mg Oral Q4H PRN Enedina Finner, MD   650 mg at 05/02/16 1659  . albuterol (PROVENTIL) (2.5 MG/3ML) 0.083% nebulizer solution 2.5 mg  2.5 mg Nebulization Q4H PRN Cindi Carbon, RPH   2.5 mg at 05/03/16 0843  . aspirin EC tablet 81 mg  81 mg Oral Daily Ramonita Lab, MD   81 mg at 05/03/16 1045  . budesonide (PULMICORT) nebulizer solution 0.25 mg  0.25 mg Nebulization BID Cindi Carbon, RPH   0.25 mg at 05/04/16 0743  . Chlorhexidine Gluconate Cloth 2 % PADS 6 each  6 each Topical Q0600 Enedina Finner, MD   6 each at 05/04/16 0600  . cholecalciferol (VITAMIN D) tablet 1,000 Units  1,000 Units Oral Daily Enedina Finner, MD  1,000 Units at 05/03/16 1045  . cloNIDine (CATAPRES) tablet 0.1 mg  0.1 mg Oral BID Ramonita LabAruna Gouru, MD   0.1 mg at 05/03/16 2207  . clopidogrel (PLAVIX) tablet 75 mg  75 mg Oral Daily Ramonita LabAruna Gouru, MD   75 mg at 05/03/16 1045  . diltiazem (CARDIZEM CD) 24 hr capsule 120 mg  120 mg Oral Q lunch Enedina FinnerSona Patel, MD   120 mg at 05/02/16 1210  . docusate sodium (COLACE) capsule 100 mg  100 mg Oral BID Enedina FinnerSona Patel, MD   100 mg at 05/03/16 2207  . enoxaparin (LOVENOX) injection 30 mg  30 mg Subcutaneous Q24H Enedina FinnerSona Patel, MD   30 mg at 05/03/16 2208  . famotidine (PEPCID) tablet 20 mg  20 mg Oral BID Enedina FinnerSona Patel, MD   20  mg at 05/03/16 2207  . feeding supplement (ENSURE ENLIVE) (ENSURE ENLIVE) liquid 237 mL  237 mL Oral Daily Enedina FinnerSona Patel, MD   237 mL at 05/03/16 1100  . ferrous sulfate tablet 325 mg  325 mg Oral Daily Enedina FinnerSona Patel, MD   325 mg at 05/03/16 1045  . fluticasone (FLONASE) 50 MCG/ACT nasal spray 2 spray  2 spray Each Nare Gabriela [id number]-[id number] Sona Patel, MD   2 spray at 05/04/16 0524  . furosemide (LASIX) tablet 20 mg  20 mg Oral Daily Enedina FinnerSona Patel, MD   20 mg at 05/03/16 1045  . guaiFENesin-dextromethorphan (ROBITUSSIN DM) 100-10 MG/5ML syrup 10 mL  10 mL Oral Q6H PRN Ramonita LabAruna Gouru, MD   10 mL at 05/03/16 0843  . levofloxacin (LEVAQUIN) tablet 250 mg  250 mg Oral Daily Ramonita LabAruna Gouru, MD   250 mg at 05/03/16 1044  . levothyroxine (SYNTHROID, LEVOTHROID) tablet 125 mcg  125 mcg Oral Q0600 Enedina FinnerSona Patel, MD   125 mcg at 05/04/16 0524  . LORazepam (ATIVAN) tablet 0.5 mg  0.5 mg Oral TID Enedina FinnerSona Patel, MD   0.5 mg at 05/03/16 2207  . losartan (COZAAR) tablet 50 mg  50 mg Oral Daily Enedina FinnerSona Patel, MD   50 mg at 05/03/16 1045  . multivitamin with minerals tablet 1 tablet  1 tablet Oral Daily Enedina FinnerSona Patel, MD   1 tablet at 05/03/16 1042  . mupirocin ointment (BACTROBAN) 2 % 1 application  1 application Nasal BID Enedina FinnerSona Patel, MD   1 application at 05/03/16 2208  . ondansetron (ZOFRAN) injection 4 mg  4 mg Intravenous Q6H PRN Enedina FinnerSona Patel, MD      . polyethylene glycol (MIRALAX / GLYCOLAX) packet 17 g  17 g Oral Daily Enedina FinnerSona Patel, MD   17 g at 05/03/16 1040  . sodium bicarbonate tablet 325 mg  325 mg Oral QID Enedina FinnerSona Patel, MD   325 mg at 05/03/16 2207  . sodium chloride flush (NS) 0.9 % injection 3 mL  3 mL Intravenous Q12H Enedina FinnerSona Patel, MD   3 mL at 05/03/16 2209  . sodium chloride flush (NS) 0.9 % injection 3 mL  3 mL Intravenous PRN Enedina FinnerSona Patel, MD      . spironolactone (ALDACTONE) tablet 25 mg  25 mg Oral Daily Enedina FinnerSona Patel, MD   25 mg at 05/03/16 1045  . tiotropium (SPIRIVA) inhalation capsule 18 mcg  18 mcg Inhalation Daily Enedina FinnerSona Patel, MD   18 mcg at  05/03/16 0900  . tolvaptan (SAMSCA) tablet 15 mg  15 mg Oral Q24H Enedina FinnerSona Patel, MD   15 mg at 05/03/16 1706  . vitamin B-12 (CYANOCOBALAMIN) tablet 1,000 mcg  1,000 mcg Oral Daily Enedina FinnerSona Patel,  MD   1,000 mcg at 05/03/16 1047     Discharge Medications: Please see discharge summary for a list of discharge medications.  Relevant Imaging Results:  Relevant Lab Results:   Additional Information SS: [id number]  Darleene Cleaver

## 2016-05-04 NOTE — Progress Notes (Signed)
[facility]  Patient: Allen Derrynnie C Surette / Admit Date: 05/01/2016 / Date of Encounter: 05/04/2016, 8:30 AM   Subjective: Patient resting comfortably at this time with no worsening of symptoms. Patient still mildly short of breath with ambulation with edema improved. Overall heart rate controlled with atrial fibrillation  Review of Systems: Positive for: Shortness of breath weakness Negative for: Vision change, hearing change, syncope, dizziness, nausea, vomiting,diarrhea, bloody stool, stomach pain, cough, congestion, diaphoresis, urinary frequency, urinary pain,skin lesions, skin rashes Others previously listed  Objective: Telemetry: Atrial fibrillation with controlled ventricular rate Physical Exam: Blood pressure (!) 160/51, pulse 83, temperature 98.2 F (36.8 C), resp. rate 18, height 4\' 9"  (1.448 m), weight 47.4 kg (104 lb 9.6 oz), SpO2 100 %. Body mass index is 22.64 kg/m. General: Well developed, well nourished, in no acute distress. Head: Normocephalic, atraumatic, sclera non-icteric, no xanthomas, nares are without discharge. Neck: No apparent masses Lungs: Normal respirations with decreased breath sounds in bases no wheezes, no rhonchi, basilar rales , basilar crackles   Heart: Irregular rate and rhythm, normal S1 S2, no murmur, no rub, no gallop, PMI is normal size and placement, carotid upstroke normal without bruit, jugular venous pressure normal Abdomen: Soft, non-tender, non-distended with normoactive bowel sounds. No hepatosplenomegaly. Abdominal aorta is normal size without bruit Extremities: Trace to 1+ edema, no clubbing, no cyanosis, no ulcers,  Peripheral: 2+ radial, 2+ femoral, 2+ dorsal pedal pulses Neuro: Alert and oriented. Moves all extremities spontaneously. Psych:  Responds to questions appropriately with a normal affect.   Intake/Output Summary (Last 24 hours) at 05/04/16 0830 Last data filed at 05/04/16 0803  Gross per  24 hour  Intake                0 ml  Output             3300 ml  Net            -3300 ml    Inpatient Medications:  . aspirin EC  81 mg Oral Daily  . budesonide (PULMICORT) nebulizer solution  0.25 mg Nebulization BID  . Chlorhexidine Gluconate Cloth  6 each Topical Q0600  . cholecalciferol  1,000 Units Oral Daily  . cloNIDine  0.1 mg Oral BID  . clopidogrel  75 mg Oral Daily  . diltiazem  120 mg Oral Q lunch  . docusate sodium  100 mg Oral BID  . enoxaparin (LOVENOX) injection  30 mg Subcutaneous Q24H  . famotidine  20 mg Oral BID  . feeding supplement (ENSURE ENLIVE)  237 mL Oral Daily  . ferrous sulfate  325 mg Oral Daily  . fluticasone  2 spray Each Nare [id number]  . furosemide  20 mg Oral Daily  . levofloxacin  250 mg Oral Daily  . levothyroxine  125 mcg Oral Q0600  . LORazepam  0.5 mg Oral TID  . losartan  50 mg Oral Daily  . multivitamin with minerals  1 tablet Oral Daily  . mupirocin ointment  1 application Nasal BID  . polyethylene glycol  17 g Oral Daily  . sodium bicarbonate  325 mg Oral QID  . sodium chloride flush  3 mL Intravenous Q12H  . spironolactone  25 mg Oral Daily  . tiotropium  18 mcg Inhalation Daily  . tolvaptan  15 mg Oral Q24H  . vitamin B-12  1,000 mcg Oral Daily   Infusions:    Labs:  Recent Labs  05/03/16 0621 05/04/16 0425  NA 123*  128*  K 4.4 4.7  CL 74* 80*  CO2 43* 41*  GLUCOSE 103* 106*  BUN 11 8  CREATININE 0.55 0.52  CALCIUM 8.7* 8.6*   No results for input(s): AST, ALT, ALKPHOS, BILITOT, PROT, ALBUMIN in the last 72 hours.  Recent Labs  05/01/16 1450 05/04/16 0628  WBC 16.4* 13.5*  NEUTROABS 14.6*  --   HGB 10.4* 11.2*  HCT 30.9* 34.1*  MCV 82.9 84.4  PLT 383 387    Recent Labs  05/01/16 1450  TROPONINI <0.03   Invalid input(s): POCBNP No results for input(s): HGBA1C in the last 72 hours.   Weights: Filed Weights   05/02/16 0500 05/03/16 0439 05/04/16 0445  Weight: 49.4 kg (108 lb 12.8 oz) 48.9 kg (107 lb  14.4 oz) 47.4 kg (104 lb 9.6 oz)     Radiology/Studies:  Dg Chest 1 View  Result Date: 05/01/2016 CLINICAL DATA:  Post thoracentesis EXAM: CHEST 1 VIEW COMPARISON:  Chest radiograph- 05/01/2016; 02/21/2016; 02/18/2016; chest CT- 02/21/2016 FINDINGS: Grossly unchanged enlarged cardiac silhouette and mediastinal contours with atherosclerotic plaque within the thoracic aorta. Interval reduction/resolution of left-sided effusion post thoracentesis. No pneumothorax. Unchanged small right-sided effusion and associated right basilar opacities. Improved aeration of the left lower lung with minimal residual left basilar opacities favored to represent atelectasis. Mild cephalization of flow without frank evidence of edema. No acute osseus abnormalities. IMPRESSION: 1. Interval reduction/resolution of left-sided effusion post thoracentesis. No pneumothorax. 2. Unchanged small/trace right-sided effusion and associated right basilar opacities, likely atelectasis. 3. Cardiomegaly and pulmonary venous congestion without frank evidence of edema. 4.  Aortic Atherosclerosis (ICD10-170.0) Electronically Signed   By: Simonne Come M.D.   On: 05/01/2016 17:46   Dg Chest Portable 1 View  Result Date: 05/01/2016 CLINICAL DATA:  Labored breathing EXAM: PORTABLE CHEST 1 VIEW COMPARISON:  02/21/2016 FINDINGS: Cardiac shadow is enlarged. Increasing left-sided pleural effusion is now noted. A small right-sided pleural effusion is again seen. Mild congestive failure is noted without significant interstitial edema. IMPRESSION: Enlarging left-sided pleural effusion. Stable small right pleural effusion. Mild CHF. Electronically Signed   By: Alcide Clever M.D.   On: 05/01/2016 15:19   US Thoracentesis Asp Pleural Space W/img Guide  Result Date: 05/01/2016 INDICATION: Symptomatic left sided pleural effusion EXAM: US THORACENTESIS ASP PLEURAL SPACE W/IMG GUIDE COMPARISON:  Chest radiograph - 05/01/2016; chest CT - 02/21/2016;  left-sided thoracentesis - 02/21/2016 MEDICATIONS: None. COMPLICATIONS: None immediate. TECHNIQUE: Informed written consent was obtained from the patient after a discussion of the risks, benefits and alternatives to treatment. A timeout was performed prior to the initiation of the procedure. Initial ultrasound scanning demonstrates a large anechoic left-sided pleural effusion. The lower chest was prepped and draped in the usual sterile fashion. 1% lidocaine was used for local anesthesia. An ultrasound image was saved for documentation purposes. An 8 Fr Safe-T-Centesis catheter was introduced. The thoracentesis was performed. The catheter was removed and a dressing was applied. The patient tolerated the procedure well without immediate post procedural complication. The patient was escorted to have an upright chest radiograph. FINDINGS: A total of approximately 750 cc of serous fluid was removed. IMPRESSION: Successful ultrasound-guided left sided thoracentesis yielding 750 cc of pleural fluid. Electronically Signed   By: Simonne Come M.D.   On: 05/01/2016 17:48     Assessment and Recommendation  82 y.o. female with acute on chronic diastolic dysfunction congestive heart failure essential hypertension chronic hyponatremia and chronic nonvalvular atrial fibrillation with left pleural effusion status post thoracentesis significantly  improved at this time with unknown reason for elevated white blood cell count resting comfortably 1. Continue diltiazem for heart rate control of atrial fibrillation without change today but would follow closely for concerns of the bradycardia with addition of clonidine 2. No anticoagulation at this time due to concerns of significant bleeding complicationAnd/or anemia 3. Furosemide for pleural effusion and pulmonary edema and lower extremity edema watching closely for concerns of hyponatremia 4. No further cardiac diagnostics necessary at this time 5. Begin ambulation and follow for  any further significant symptoms and need and adjustments of medication management 6. Further consideration of rehabilitation and outside facility for cardiac and congestive heart failure rehabilitation 7. Follow-up form the cardiology standpoint in one to 2 weeks for further adjustments of medication management 8. Call if further questions  Signed, Arnoldo Hooker M.D. FACC

## 2016-05-06 ENCOUNTER — Other Ambulatory Visit
Admission: RE | Admit: 2016-05-06 | Discharge: 2016-05-06 | Disposition: A | Discharge status: Home / Self Care | Payer: No Typology Code available for payment source | Source: Ambulatory Visit | Attending: Family Medicine | Admitting: Family Medicine

## 2016-05-06 DIAGNOSIS — R05 Cough: Secondary | ICD-10-CM | POA: Diagnosis present

## 2016-05-08 LAB — LEGIONELLA PNEUMOPHILA SEROGP 1 UR AG: L. pneumophila Serogp 1 Ur Ag: NEGATIVE

## 2016-05-13 ENCOUNTER — Ambulatory Visit: Payer: Medicare Other | Admitting: Family

## 2016-05-25 ENCOUNTER — Other Ambulatory Visit: Payer: Self-pay

## 2016-05-25 DIAGNOSIS — N3281 Overactive bladder: Secondary | ICD-10-CM

## 2016-05-25 MED ORDER — OXYBUTYNIN CHLORIDE 10 % TD GEL: 1.0000 | Freq: Every day | TRANSDERMAL | 6 refills | Status: DC

## 2016-07-09 ENCOUNTER — Telehealth: Payer: Self-pay

## 2016-07-09 NOTE — Telephone Encounter (Signed)
PA for gelnique APPROVED until 06/2017.

## 2016-08-20 ENCOUNTER — Inpatient Hospital Stay
Admission: EM | Admit: 2016-08-20 | Discharge: 2016-08-22 | DRG: 064 | Disposition: A | Discharge status: Home Health | Payer: Medicare Other | Attending: Internal Medicine | Admitting: Internal Medicine

## 2016-08-20 ENCOUNTER — Encounter: Payer: Self-pay | Admitting: Emergency Medicine

## 2016-08-20 ENCOUNTER — Inpatient Hospital Stay
Admit: 2016-08-20 | Discharge: 2016-08-20 | Disposition: A | Discharge status: Home / Self Care | Payer: Medicare Other | Attending: Internal Medicine | Admitting: Internal Medicine

## 2016-08-20 ENCOUNTER — Emergency Department: Payer: Medicare Other

## 2016-08-20 DIAGNOSIS — Z886 Allergy status to analgesic agent status: Secondary | ICD-10-CM

## 2016-08-20 DIAGNOSIS — Z66 Do not resuscitate: Secondary | ICD-10-CM | POA: Diagnosis present

## 2016-08-20 DIAGNOSIS — I639 Cerebral infarction, unspecified: Secondary | ICD-10-CM | POA: Diagnosis not present

## 2016-08-20 DIAGNOSIS — Z8249 Family history of ischemic heart disease and other diseases of the circulatory system: Secondary | ICD-10-CM | POA: Diagnosis not present

## 2016-08-20 DIAGNOSIS — Z79899 Other long term (current) drug therapy: Secondary | ICD-10-CM

## 2016-08-20 DIAGNOSIS — J8410 Pulmonary fibrosis, unspecified: Secondary | ICD-10-CM | POA: Diagnosis present

## 2016-08-20 DIAGNOSIS — Z9981 Dependence on supplemental oxygen: Secondary | ICD-10-CM | POA: Diagnosis not present

## 2016-08-20 DIAGNOSIS — I10 Essential (primary) hypertension: Secondary | ICD-10-CM | POA: Diagnosis present

## 2016-08-20 DIAGNOSIS — M810 Age-related osteoporosis without current pathological fracture: Secondary | ICD-10-CM | POA: Diagnosis present

## 2016-08-20 DIAGNOSIS — K219 Gastro-esophageal reflux disease without esophagitis: Secondary | ICD-10-CM | POA: Diagnosis present

## 2016-08-20 DIAGNOSIS — Z888 Allergy status to other drugs, medicaments and biological substances status: Secondary | ICD-10-CM

## 2016-08-20 DIAGNOSIS — R29703 NIHSS score 3: Secondary | ICD-10-CM | POA: Diagnosis present

## 2016-08-20 DIAGNOSIS — Z7902 Long term (current) use of antithrombotics/antiplatelets: Secondary | ICD-10-CM

## 2016-08-20 DIAGNOSIS — H5711 Ocular pain, right eye: Secondary | ICD-10-CM | POA: Diagnosis present

## 2016-08-20 DIAGNOSIS — Z90710 Acquired absence of both cervix and uterus: Secondary | ICD-10-CM | POA: Diagnosis not present

## 2016-08-20 DIAGNOSIS — M6281 Muscle weakness (generalized): Secondary | ICD-10-CM

## 2016-08-20 DIAGNOSIS — Z8701 Personal history of pneumonia (recurrent): Secondary | ICD-10-CM | POA: Diagnosis not present

## 2016-08-20 DIAGNOSIS — I611 Nontraumatic intracerebral hemorrhage in hemisphere, cortical: Secondary | ICD-10-CM | POA: Diagnosis present

## 2016-08-20 DIAGNOSIS — Z8673 Personal history of transient ischemic attack (TIA), and cerebral infarction without residual deficits: Secondary | ICD-10-CM

## 2016-08-20 DIAGNOSIS — G43909 Migraine, unspecified, not intractable, without status migrainosus: Secondary | ICD-10-CM | POA: Diagnosis present

## 2016-08-20 DIAGNOSIS — E039 Hypothyroidism, unspecified: Secondary | ICD-10-CM | POA: Diagnosis present

## 2016-08-20 DIAGNOSIS — E785 Hyperlipidemia, unspecified: Secondary | ICD-10-CM | POA: Diagnosis present

## 2016-08-20 DIAGNOSIS — Z885 Allergy status to narcotic agent status: Secondary | ICD-10-CM

## 2016-08-20 DIAGNOSIS — I63532 Cerebral infarction due to unspecified occlusion or stenosis of left posterior cerebral artery: Principal | ICD-10-CM | POA: Diagnosis present

## 2016-08-20 DIAGNOSIS — I619 Nontraumatic intracerebral hemorrhage, unspecified: Secondary | ICD-10-CM

## 2016-08-20 DIAGNOSIS — J449 Chronic obstructive pulmonary disease, unspecified: Secondary | ICD-10-CM | POA: Diagnosis present

## 2016-08-20 DIAGNOSIS — Z7982 Long term (current) use of aspirin: Secondary | ICD-10-CM

## 2016-08-20 DIAGNOSIS — I482 Chronic atrial fibrillation: Secondary | ICD-10-CM | POA: Diagnosis present

## 2016-08-20 DIAGNOSIS — E871 Hypo-osmolality and hyponatremia: Secondary | ICD-10-CM | POA: Diagnosis present

## 2016-08-20 DIAGNOSIS — H5461 Unqualified visual loss, right eye, normal vision left eye: Secondary | ICD-10-CM | POA: Diagnosis present

## 2016-08-20 DIAGNOSIS — H53461 Homonymous bilateral field defects, right side: Secondary | ICD-10-CM | POA: Diagnosis present

## 2016-08-20 DIAGNOSIS — F419 Anxiety disorder, unspecified: Secondary | ICD-10-CM | POA: Diagnosis present

## 2016-08-20 DIAGNOSIS — I612 Nontraumatic intracerebral hemorrhage in hemisphere, unspecified: Secondary | ICD-10-CM | POA: Diagnosis not present

## 2016-08-20 DIAGNOSIS — Z7951 Long term (current) use of inhaled steroids: Secondary | ICD-10-CM

## 2016-08-20 LAB — DIFFERENTIAL
Basophils Absolute: 0 10*3/uL (ref 0–0.1)
Basophils Relative: 0 %
Eosinophils Absolute: 0.1 10*3/uL (ref 0–0.7)
Eosinophils Relative: 1 %
LYMPHS ABS: 0.7 10*3/uL — AB (ref 1.0–3.6)
LYMPHS PCT: 6 %
MONOS PCT: 8 %
Monocytes Absolute: 0.9 10*3/uL (ref 0.2–0.9)
NEUTROS ABS: 9.5 10*3/uL — AB (ref 1.4–6.5)
NEUTROS PCT: 85 %

## 2016-08-20 LAB — COMPREHENSIVE METABOLIC PANEL
ALBUMIN: 3.9 g/dL (ref 3.5–5.0)
ALT: 12 U/L — AB (ref 14–54)
AST: 16 U/L (ref 15–41)
Alkaline Phosphatase: 92 U/L (ref 38–126)
Anion gap: 7 (ref 5–15)
BILIRUBIN TOTAL: 0.6 mg/dL (ref 0.3–1.2)
BUN: 20 mg/dL (ref 6–20)
CO2: 33 mmol/L — ABNORMAL HIGH (ref 22–32)
CREATININE: 0.69 mg/dL (ref 0.44–1.00)
Calcium: 9 mg/dL (ref 8.9–10.3)
Chloride: 90 mmol/L — ABNORMAL LOW (ref 101–111)
GFR calc Af Amer: >60 mL/min (ref >60)
GLUCOSE: 102 mg/dL — AB (ref 65–99)
Potassium: 4.2 mmol/L (ref 3.5–5.1)
Sodium: 130 mmol/L — ABNORMAL LOW (ref 135–145)
TOTAL PROTEIN: 7 g/dL (ref 6.5–8.1)

## 2016-08-20 LAB — GLUCOSE, CAPILLARY: Glucose-Capillary: 112 mg/dL — ABNORMAL HIGH (ref 65–99)

## 2016-08-20 LAB — APTT: aPTT: 39 seconds — ABNORMAL HIGH (ref 24–36)

## 2016-08-20 LAB — LIPID PANEL
Cholesterol: 142 mg/dL (ref 0–200)
HDL: 67 mg/dL (ref >40)
LDL Cholesterol: 65 mg/dL (ref 0–99)
Total CHOL/HDL Ratio: 2.1 RATIO
Triglycerides: 50 mg/dL (ref <150)
VLDL: 10 mg/dL (ref 0–40)

## 2016-08-20 LAB — CBC
HEMATOCRIT: 29.1 % — AB (ref 35.0–47.0)
HEMOGLOBIN: 10.1 g/dL — AB (ref 12.0–16.0)
MCH: 29.4 pg (ref 26.0–34.0)
MCHC: 34.6 g/dL (ref 32.0–36.0)
MCV: 84.9 fL (ref 80.0–100.0)
Platelets: 297 10*3/uL (ref 150–440)
RBC: 3.43 MIL/uL — AB (ref 3.80–5.20)
RDW: 15.1 % — ABNORMAL HIGH (ref 11.5–14.5)
WBC: 11.2 10*3/uL — AB (ref 3.6–11.0)

## 2016-08-20 LAB — PROTIME-INR
INR: 0.98
Prothrombin Time: 13 seconds (ref 11.4–15.2)

## 2016-08-20 LAB — TROPONIN I

## 2016-08-20 MED ORDER — TIOTROPIUM BROMIDE MONOHYDRATE 18 MCG IN CAPS
18.0000 ug | ORAL_CAPSULE | Freq: Every day | RESPIRATORY_TRACT | Status: DC
  Administered 2016-08-21 – 2016-08-22 (×2): 18 ug via RESPIRATORY_TRACT
  Filled 2016-08-20: qty 5

## 2016-08-20 MED ORDER — CLONIDINE HCL 0.1 MG PO TABS
0.1000 mg | ORAL_TABLET | Freq: Three times a day (TID) | ORAL | Status: DC
  Administered 2016-08-20 – 2016-08-22 (×5): 0.1 mg via ORAL
  Filled 2016-08-20 (×5): qty 1

## 2016-08-20 MED ORDER — VITAMIN D 1000 UNITS PO TABS
1000.0000 [IU] | ORAL_TABLET | Freq: Every day | ORAL | Status: DC
  Administered 2016-08-21 – 2016-08-22 (×2): 1000 [IU] via ORAL
  Filled 2016-08-20 (×2): qty 1

## 2016-08-20 MED ORDER — FLUTICASONE PROPIONATE 50 MCG/ACT NA SUSP
2.0000 | NASAL | Status: DC
Start: 2016-08-21 — End: 2016-08-20

## 2016-08-20 MED ORDER — ASPIRIN EC 81 MG PO TBEC
81.0000 mg | DELAYED_RELEASE_TABLET | Freq: Every evening | ORAL | Status: DC
  Administered 2016-08-20: 81 mg via ORAL
  Filled 2016-08-20: qty 1

## 2016-08-20 MED ORDER — LOSARTAN POTASSIUM 50 MG PO TABS
50.0000 mg | ORAL_TABLET | Freq: Every day | ORAL | Status: DC
  Administered 2016-08-21 – 2016-08-22 (×2): 50 mg via ORAL
  Filled 2016-08-20 (×2): qty 1

## 2016-08-20 MED ORDER — DILTIAZEM HCL ER COATED BEADS 120 MG PO CP24
120.0000 mg | ORAL_CAPSULE | Freq: Every day | ORAL | Status: DC
  Administered 2016-08-21 – 2016-08-22 (×2): 120 mg via ORAL
  Filled 2016-08-20 (×3): qty 1

## 2016-08-20 MED ORDER — CLOPIDOGREL BISULFATE 75 MG PO TABS
75.0000 mg | ORAL_TABLET | Freq: Every day | ORAL | Status: DC
  Administered 2016-08-21: 75 mg via ORAL
  Filled 2016-08-20: qty 1

## 2016-08-20 MED ORDER — ALBUTEROL SULFATE (2.5 MG/3ML) 0.083% IN NEBU: 3.0000 mL | INHALATION_SOLUTION | RESPIRATORY_TRACT | Status: DC | PRN

## 2016-08-20 MED ORDER — ADULT MULTIVITAMIN W/MINERALS CH
1.0000 | ORAL_TABLET | Freq: Every day | ORAL | Status: DC
  Administered 2016-08-21 – 2016-08-22 (×2): 1 via ORAL
  Filled 2016-08-20 (×2): qty 1

## 2016-08-20 MED ORDER — HEPARIN SODIUM (PORCINE) 5000 UNIT/ML IJ SOLN
5000.0000 [IU] | Freq: Three times a day (TID) | INTRAMUSCULAR | Status: DC
  Filled 2016-08-20: qty 1

## 2016-08-20 MED ORDER — GUAIFENESIN-DM 100-10 MG/5ML PO SYRP: 10.0000 mL | ORAL_SOLUTION | Freq: Four times a day (QID) | ORAL | Status: DC | PRN

## 2016-08-20 MED ORDER — OXYBUTYNIN CHLORIDE 10 % TD GEL
1.0000 | Freq: Every day | TRANSDERMAL | Status: DC
  Administered 2016-08-21: 1 via TRANSDERMAL
  Filled 2016-08-20 (×2): qty 1

## 2016-08-20 MED ORDER — FUROSEMIDE 20 MG PO TABS
20.0000 mg | ORAL_TABLET | Freq: Every day | ORAL | Status: DC
  Administered 2016-08-21 – 2016-08-22 (×2): 20 mg via ORAL
  Filled 2016-08-20 (×2): qty 1

## 2016-08-20 MED ORDER — POLYETHYLENE GLYCOL 3350 17 G PO PACK
17.0000 g | PACK | Freq: Every day | ORAL | Status: DC
  Administered 2016-08-21 – 2016-08-22 (×2): 17 g via ORAL
  Filled 2016-08-20 (×2): qty 1

## 2016-08-20 MED ORDER — VITAMIN B-12 1000 MCG PO TABS
1000.0000 ug | ORAL_TABLET | Freq: Every day | ORAL | Status: DC
  Administered 2016-08-21 – 2016-08-22 (×2): 1000 ug via ORAL
  Filled 2016-08-20 (×2): qty 1

## 2016-08-20 MED ORDER — LORAZEPAM 0.5 MG PO TABS
0.5000 mg | ORAL_TABLET | Freq: Three times a day (TID) | ORAL | Status: DC
  Administered 2016-08-20 – 2016-08-22 (×5): 0.5 mg via ORAL
  Filled 2016-08-20 (×5): qty 1

## 2016-08-20 MED ORDER — ACETAMINOPHEN 325 MG PO TABS
650.0000 mg | ORAL_TABLET | Freq: Three times a day (TID) | ORAL | Status: DC | PRN
  Administered 2016-08-20: 22:00:00 325 mg via ORAL
  Filled 2016-08-20: qty 2

## 2016-08-20 MED ORDER — FAMOTIDINE 20 MG PO TABS: 20.0000 mg | ORAL_TABLET | Freq: Every day | ORAL | Status: DC

## 2016-08-20 MED ORDER — SODIUM CHLORIDE 0.9% FLUSH
3.0000 mL | Freq: Two times a day (BID) | INTRAVENOUS | Status: DC
  Administered 2016-08-20 – 2016-08-22 (×4): 3 mL via INTRAVENOUS

## 2016-08-20 MED ORDER — SODIUM BICARBONATE 650 MG PO TABS
325.0000 mg | ORAL_TABLET | Freq: Four times a day (QID) | ORAL | Status: DC
  Administered 2016-08-20 – 2016-08-22 (×7): 325 mg via ORAL
  Filled 2016-08-20: qty 0.5
  Filled 2016-08-20 (×2): qty 1
  Filled 2016-08-20: qty 0.5
  Filled 2016-08-20: qty 2
  Filled 2016-08-20: qty 1
  Filled 2016-08-20 (×2): qty 0.5
  Filled 2016-08-20: qty 1
  Filled 2016-08-20 (×2): qty 0.5

## 2016-08-20 MED ORDER — DOCUSATE SODIUM 100 MG PO CAPS
100.0000 mg | ORAL_CAPSULE | Freq: Two times a day (BID) | ORAL | Status: DC
  Administered 2016-08-20 – 2016-08-22 (×4): 100 mg via ORAL
  Filled 2016-08-20 (×4): qty 1

## 2016-08-20 MED ORDER — FERROUS SULFATE 325 (65 FE) MG PO TABS
325.0000 mg | ORAL_TABLET | Freq: Every day | ORAL | Status: DC
  Administered 2016-08-21 – 2016-08-22 (×2): 325 mg via ORAL
  Filled 2016-08-20 (×2): qty 1

## 2016-08-20 MED ORDER — ENSURE ENLIVE PO LIQD
237.0000 mL | Freq: Every day | ORAL | Status: DC
  Administered 2016-08-20 – 2016-08-22 (×2): 237 mL via ORAL

## 2016-08-20 MED ORDER — SPIRONOLACTONE 25 MG PO TABS
25.0000 mg | ORAL_TABLET | Freq: Every day | ORAL | Status: DC
  Administered 2016-08-21 – 2016-08-22 (×2): 25 mg via ORAL
  Filled 2016-08-20 (×2): qty 1

## 2016-08-20 MED ORDER — LEVOTHYROXINE SODIUM 25 MCG PO TABS
125.0000 ug | ORAL_TABLET | Freq: Every day | ORAL | Status: DC
  Administered 2016-08-21 – 2016-08-22 (×2): 125 ug via ORAL
  Filled 2016-08-20 (×2): qty 1

## 2016-08-20 MED ORDER — FAMOTIDINE 20 MG PO TABS
20.0000 mg | ORAL_TABLET | Freq: Every day | ORAL | Status: DC
  Administered 2016-08-20 – 2016-08-21 (×2): 20 mg via ORAL
  Filled 2016-08-20: qty 1

## 2016-08-20 MED ORDER — FAMOTIDINE 20 MG PO TABS
20.0000 mg | ORAL_TABLET | Freq: Two times a day (BID) | ORAL | Status: DC
  Filled 2016-08-20: qty 1

## 2016-08-20 MED ORDER — BUDESONIDE 0.25 MG/2ML IN SUSP
2.0000 mL | Freq: Two times a day (BID) | RESPIRATORY_TRACT | Status: DC
  Administered 2016-08-20 – 2016-08-22 (×4): 0.25 mg via RESPIRATORY_TRACT
  Filled 2016-08-20 (×4): qty 2

## 2016-08-20 NOTE — H&P (Signed)
[facility] [place] [facility]   PATIENT NAME: Alyssa Landry    MR#:  [id number]  DATE OF BIRTH:  1919-05-18  DATE OF ADMISSION:  08/20/2016  PRIMARY CARE PHYSICIAN: Rafael Bihari, MD   REQUESTING/REFERRING PHYSICIAN: Eber Jones  CHIEF COMPLAINT:   Chief Complaint  Patient presents with  . Visual Field Change    HISTORY OF PRESENT ILLNESS: Alyssa Landry  is a 83 y.o. female with a known history of A. fib, anxiety, COPD, hyponatremia, migraine, osteoporosis, pulmonary fibrosis and on chronic oxygen- she started having some pain in her right side of the head and some visual symptoms yesterday evening. As she had this migraines frequently daughter thought it is one of those episodes and just waited overnight for it to go away. Patient denies any other symptoms of numbness weakness or speech problem. Today morning as the pain still persisted they took her to an ophthalmologist office and after doing detailed eye exam this suggested this is not her usual migraine but it looks like a stroke. In ER on CT scan she was noted to have occipital stroke and so she is given his admission to hospitalist team. ER physician also spoke to neurologist and she suggested to continue aspirin and Plavix without making any changes. She suggested to do a further stroke workup.  PAST MEDICAL HISTORY:   Past Medical History:  Diagnosis Date  . A-fib (HCC)   . Acid reflux   . Anemia   . Anxiety   . Arthritis   . Asthma   . Chronic hyponatremia   . COPD (chronic obstructive pulmonary disease) (HCC)   . GERD (gastroesophageal reflux disease)   . Hiatal hernia   . Hiatal hernia   . Hyperlipidemia   . Hypertension   . Hypothyroidism   . Incontinence   . Migraine   . Osteoporosis, post-menopausal   . Persistent cough   . Pulmonary fibrosis (HCC)   . SIADH (syndrome of inappropriate ADH production) (HCC)   . TIA (transient ischemic attack)     PAST SURGICAL HISTORY: Past Surgical  History:  Procedure Laterality Date  . ABDOMINAL HYSTERECTOMY  1966    SOCIAL HISTORY:  Social History  Substance Use Topics  . Smoking status: Never Smoker  . Smokeless tobacco: Never Used  . Alcohol use No    FAMILY HISTORY:  Family History  Problem Relation Age of Onset  . Leukemia Mother   . Heart disease Father   . Hypertension      DRUG ALLERGIES:  Allergies  Allergen Reactions  . Aspirin Other (See Comments)    Upset stomach with high doses  . Codeine Other (See Comments)    Reaction:  Unknown   . Ditropan [Oxybutynin] Other (See Comments)    Reaction:  Unknown   . Hctz [Hydrochlorothiazide] Other (See Comments)    Reaction:  Causes pts sodium/potassium levels to drop significantly   . Metronidazole Other (See Comments)    Reaction:  Unknown   . Propranolol Other (See Comments)    Reaction:  Unknown   . Tavist [Clemastine] Other (See Comments)    Reaction:  Unknown   . Tramadol Other (See Comments)    Reaction:  Unknown   . Vicodin [Hydrocodone-Acetaminophen] Other (See Comments)    Reaction:  Unknown     REVIEW OF SYSTEMS:   CONSTITUTIONAL: No fever, fatigue or weakness.  EYES: Right eye blurred or double vision.  EARS, NOSE, AND THROAT: No tinnitus or ear pain.  RESPIRATORY: No cough, shortness of breath, wheezing or hemoptysis.  CARDIOVASCULAR: No chest pain, orthopnea, edema.  GASTROINTESTINAL: No nausea, vomiting, diarrhea or abdominal pain.  GENITOURINARY: No dysuria, hematuria.  ENDOCRINE: No polyuria, nocturia,  HEMATOLOGY: No anemia, easy bruising or bleeding SKIN: No rash or lesion. MUSCULOSKELETAL: No joint pain or arthritis.   NEUROLOGIC: No tingling, numbness, weakness.  PSYCHIATRY: No anxiety or depression.   MEDICATIONS AT HOME:  Prior to Admission medications   Medication Sig Start Date End Date Taking? Authorizing Provider  acetaminophen (TYLENOL) 650 MG CR tablet Take 650 mg by mouth every 8 (eight) hours as needed for pain.    Yes Historical Provider, MD  albuterol (PROVENTIL HFA;VENTOLIN HFA) 108 (90 BASE) MCG/ACT inhaler Inhale 1-2 puffs into the lungs every 4 (four) hours as needed for wheezing or shortness of breath.   Yes Historical Provider, MD  aspirin EC 81 MG tablet Take 81 mg by mouth every evening.    Yes Historical Provider, MD  cholecalciferol (VITAMIN D) 1000 UNITS tablet Take 1,000 Units by mouth daily.   Yes Historical Provider, MD  cloNIDine (CATAPRES) 0.1 MG tablet Take 0.1 mg by mouth 3 (three) times daily.   Yes Historical Provider, MD  clopidogrel (PLAVIX) 75 MG tablet Take 75 mg by mouth daily with breakfast.    Yes Historical Provider, MD  diltiazem (CARDIZEM CD) 120 MG 24 hr capsule Take 1 capsule (120 mg total) by mouth daily. 12/15/15  Yes Wyatt Haste, MD  docusate sodium (COLACE) 100 MG capsule Take 100 mg by mouth 2 (two) times daily.    Yes Historical Provider, MD  feeding supplement, ENSURE ENLIVE, (ENSURE ENLIVE) LIQD Take 237 mLs by mouth daily.    Yes Historical Provider, MD  ferrous sulfate 325 (65 FE) MG tablet Take 325 mg by mouth daily.    Yes Historical Provider, MD  fluticasone (FLOVENT HFA) 110 MCG/ACT inhaler Inhale 2 puffs into the lungs 2 (two) times daily. 1610,9604   Yes Historical Provider, MD  furosemide (LASIX) 20 MG tablet Take 1 tablet (20 mg total) by mouth daily. Patient taking differently: Take 40 mg by mouth daily.  01/30/16  Yes Houston Siren, MD  guaiFENesin-dextromethorphan (ROBITUSSIN DM) 100-10 MG/5ML syrup Take 10 mLs by mouth every 6 (six) hours as needed for cough. 05/04/16  Yes Ramonita Lab, MD  levothyroxine (SYNTHROID, LEVOTHROID) 125 MCG tablet Take 125 mcg by mouth daily before breakfast.   Yes Historical Provider, MD  LORazepam (ATIVAN) 0.5 MG tablet Take 1 tablet (0.5 mg total) by mouth 3 (three) times daily. Patient taking differently: Take 0.5 mg by mouth 3 (three) times daily. 0600,1900,2100 02/25/16  Yes Houston Siren, MD  losartan (COZAAR) 50 MG  tablet Take 50 mg by mouth daily.   Yes Historical Provider, MD  multivitamin-lutein (OCUVITE-LUTEIN) CAPS capsule Take 2 capsules by mouth daily.   Yes Historical Provider, MD  Oxybutynin Chloride (GELNIQUE) 10 % GEL Place 1 application onto the skin daily. 05/25/16  Yes Shannon A McGowan, PA-C  polyethylene glycol (MIRALAX / GLYCOLAX) packet Take 17 g by mouth daily. *Mix in 4-8 ounces of fluid prior to taking*   Yes Historical Provider, MD  ranitidine (ZANTAC) 150 MG tablet Take 150 mg by mouth 2 (two) times daily before a meal.    Yes Historical Provider, MD  sodium bicarbonate 650 MG tablet Take 325 mg by mouth 4 (four) times daily.   Yes Historical Provider, MD  spironolactone (ALDACTONE) 25 MG tablet Take 25  mg by mouth daily.   Yes Historical Provider, MD  tiotropium (SPIRIVA) 18 MCG inhalation capsule Place 18 mcg into inhaler and inhale daily.   Yes Historical Provider, MD  vitamin B-12 (CYANOCOBALAMIN) 1000 MCG tablet Take 1,000 mcg by mouth daily.   Yes Historical Provider, MD  fluticasone (FLONASE) 50 MCG/ACT nasal spray Place 2 sprays into both nostrils every morning.     Historical Provider, MD      PHYSICAL EXAMINATION:   VITAL SIGNS: Blood pressure (!) 172/67, pulse 65, temperature 98.1 F (36.7 C), temperature source Oral, resp. rate 18, height 4\' 9"  (1.448 m), weight 47.2 kg (104 lb), SpO2 100 %.  GENERAL:  83 y.o.-year-old patient lying in the bed with no acute distress.  EYES: Pupils equal, round, reactive to light and accommodation. No scleral icterus. Extraocular muscles intact.  HEENT: Head atraumatic, normocephalic. Oropharynx and nasopharynx clear.  NECK:  Supple, no jugular venous distention. No thyroid enlargement, no tenderness.  LUNGS: Normal breath sounds bilaterally, no wheezing, rales,rhonchi or crepitation. No use of accessory muscles of respiration.  CARDIOVASCULAR: S1, S2 normal. No murmurs, rubs, or gallops.  ABDOMEN: Soft, nontender, nondistended. Bowel  sounds present. No organomegaly or mass.  EXTREMITIES: No pedal edema, cyanosis, or clubbing.  NEUROLOGIC: Cranial nerves are intact. Muscle strength 4-5/5 in all extremities. Sensation intact. Gait not checked.  PSYCHIATRIC: The patient is alert and oriented x 3.  SKIN: No obvious rash, lesion, or ulcer.   LABORATORY PANEL:   CBC  Recent Labs Lab 08/20/16 1231  WBC 11.2*  HGB 10.1*  HCT 29.1*  PLT 297  MCV 84.9  MCH 29.4  MCHC 34.6  RDW 15.1*  LYMPHSABS 0.7*  MONOABS 0.9  EOSABS 0.1  BASOSABS 0.0   ------------------------------------------------------------------------------------------------------------------  Chemistries   Recent Labs Lab 08/20/16 1231  NA 130*  K 4.2  CL 90*  CO2 33*  GLUCOSE 102*  BUN 20  CREATININE 0.69  CALCIUM 9.0  AST 16  ALT 12*  ALKPHOS 92  BILITOT 0.6   ------------------------------------------------------------------------------------------------------------------ estimated creatinine clearance is 27.9 mL/min (by C-G formula based on SCr of 0.69 mg/dL). ------------------------------------------------------------------------------------------------------------------ No results for input(s): TSH, T4TOTAL, T3FREE, THYROIDAB in the last 72 hours.  Invalid input(s): FREET3   Coagulation profile  Recent Labs Lab 08/20/16 1231  INR 0.98   ------------------------------------------------------------------------------------------------------------------- No results for input(s): DDIMER in the last 72 hours. -------------------------------------------------------------------------------------------------------------------  Cardiac Enzymes  Recent Labs Lab 08/20/16 1231  TROPONINI <0.03   ------------------------------------------------------------------------------------------------------------------ Invalid input(s):  POCBNP  ---------------------------------------------------------------------------------------------------------------  Urinalysis    Component Value Date/Time   COLORURINE YELLOW (A) 09/25/2015 1311   APPEARANCEUR CLOUDY (A) 09/25/2015 1311   APPEARANCEUR Cloudy 08/11/2012 1854   LABSPEC 1.006 09/25/2015 1311   LABSPEC 1.012 08/11/2012 1854   PHURINE 8.0 09/25/2015 1311   GLUCOSEU NEGATIVE 09/25/2015 1311   GLUCOSEU Negative 08/11/2012 1854   HGBUR NEGATIVE 09/25/2015 1311   BILIRUBINUR NEGATIVE 09/25/2015 1311   BILIRUBINUR Negative 08/11/2012 1854   KETONESUR NEGATIVE 09/25/2015 1311   PROTEINUR NEGATIVE 09/25/2015 1311   NITRITE NEGATIVE 09/25/2015 1311   LEUKOCYTESUR NEGATIVE 09/25/2015 1311   LEUKOCYTESUR 2+ 08/11/2012 1854     RADIOLOGY: Ct Head Wo Contrast  Result Date: 08/20/2016 CLINICAL DATA:  Visual loss.  Migraines. EXAM: CT HEAD WITHOUT CONTRAST TECHNIQUE: Contiguous axial images were obtained from the base of the skull through the vertex without intravenous contrast. COMPARISON:  06/26/2015 FINDINGS: Brain: There is low-density noted in the left occipital lobe compatible with acute to subacute infarction. No  hemorrhage. No additional area of infarction. No hydrocephalus. Vascular: No hyperdense vessel or unexpected calcification. Skull: No acute calvarial abnormality. Sinuses/Orbits: Visualized paranasal sinuses and mastoids clear. Orbital soft tissues unremarkable. Other: None IMPRESSION: Acute to subacute left occipital infarct. Electronically Signed   By: Charlett Nose M.D.   On: 08/20/2016 13:06    EKG: Orders placed or performed during the hospital encounter of 08/20/16  . ED EKG  . ED EKG    IMPRESSION AND PLAN:  * Acute stroke   Neurologist had seen the patient and suggested to them about the stroke workup but continue aspirin and Plavix as she was taking at home.   We will get PT and OT evaluation.   Patient denies any difficulty in swallowing of  food.   As the stroke most likely happened yesterday and her symptoms are minor she is not a candidate for TPA at this time.   Because of same reason now it is almost 24 hours we would like to just control her blood pressure and we can start her on DVT prophylaxis for now.  * Hypertension   Continue home medicines.  * Atrial fibrillation   She is not on any strong anticoagulation therapy except for aspirin and Plavix, continue the same.   Rate is controlled currently with oral medications.  * Hyperlipidemia   Check lipid panel, continue statin for now.  * Visual disturbance   This is secondary to her stroke, I explained them that might still be some recovery from this in next few days as patient is already feeling somewhat better.   She may need to have ophthalmologic evaluation done after 1-2 weeks.  All the records are reviewed and case discussed with ED provider. Management plans discussed with the patient, family and they are in agreement.  CODE STATUS: DO NOT RESUSCITATE Code Status History    Date Active Date Inactive Code Status Order ID Comments User Context   05/01/2016  6:48 PM 05/04/2016  7:55 PM DNR [id number]  Enedina Finner, MD Inpatient   02/18/2016 11:10 PM 02/25/2016  6:31 PM DNR [id number]  Tonye Royalty, DO Inpatient   01/26/2016  9:56 PM 01/30/2016  7:34 PM DNR [id number]  Houston Siren, MD Inpatient   12/11/2015  9:29 PM 12/15/2015  8:13 PM DNR [id number]  Ramonita Lab, MD Inpatient   09/25/2015  4:17 PM 09/26/2015 10:07 PM DNR [id number]  Altamese Dilling, MD ED    Questions for Most Recent Historical Code Status (Order [id number])    Question Answer Comment   In the event of cardiac or respiratory ARREST Do not call a "code blue"    In the event of cardiac or respiratory ARREST Do not perform Intubation, CPR, defibrillation or ACLS    In the event of cardiac or respiratory ARREST Use medication by any route, position, wound care, and other measures to relive pain and  suffering. May use oxygen, suction and manual treatment of airway obstruction as needed for comfort.         Advance Directive Documentation   Flowsheet Row Most Recent Value  Type of Advance Directive  Out of facility DNR (pink MOST or yellow form)  Pre-existing out of facility DNR order (yellow form or pink MOST form)  No data  "MOST" Form in Place?  No data    Patient's healthcare power of attorney is her daughter, she was present in the room during my visit.   TOTAL TIME TAKING CARE OF THIS PATIENT: 45 minutes.  Altamese DillingVACHHANI, Meenakshi Sazama M.D on 08/20/2016   Between 7am to 6pm - Pager - [phone]  After 6pm go to [email or web address] - password [email or web address]  [facility]  Office  [phone]  CC: Primary care physician; Rafael BihariWALKER III, JOHN B, MD   Note: This dictation was prepared with Dragon dictation along with smaller phrase technology. Any transcriptional errors that result from this process are unintentional.

## 2016-08-20 NOTE — Progress Notes (Signed)
Family Meeting Note  Advance Directive:yes  Today a meeting took place with the Patient and daughter, HCPOA.  The following clinical team members were present during this meeting:MD  The following were discussed:Patient's diagnosis: Acute stroke, A fib , Patient's progosis: Unable to determine and Goals for treatment: DNR  Additional follow-up to be provided: neurologist, PT eval.  Time spent during discussion:20 minutes  Alyssa Landry, Heath GoldVAIBHAVKUMAR, MD

## 2016-08-20 NOTE — ED Triage Notes (Signed)
Came from dr bell's office for visual field changes. Gets eye migraines that usually resolve - but awoke with same problem. Here for eval - no other deficiets.

## 2016-08-20 NOTE — ED Provider Notes (Signed)
[facility], [facility] Emergency Department Provider Note ____________________________________________   I have reviewed the triage vital signs and the triage nursing note.  HISTORY  Chief Complaint Visual Field Change   Historian Patient and daughter  HPI Alyssa Landry is a 83 y.o. female who lives at home, has a history of what she calls ocular migraines and around 7 PM last night started having right eye pain.  She went to sleep with it like that when she woke up this morning some of the blurry vision was improved but she was having right peripheral vision deficits. Her daughter brought her to her eye doctor who dilated her eyes and sent her to the emergency department out of concern for acute stroke.  No headache. She was recently hospitalized completed 10 days antibiotics and reports that although she is on 2 L home oxygen, she had improvement in her respiratory status and feels good from that standpoint.    Past Medical History:  Diagnosis Date  . A-fib (HCC)   . Acid reflux   . Anemia   . Anxiety   . Arthritis   . Asthma   . Chronic hyponatremia   . COPD (chronic obstructive pulmonary disease) (HCC)   . GERD (gastroesophageal reflux disease)   . Hiatal hernia   . Hiatal hernia   . Hyperlipidemia   . Hypertension   . Hypothyroidism   . Incontinence   . Migraine   . Osteoporosis, post-menopausal   . Persistent cough   . Pulmonary fibrosis (HCC)   . SIADH (syndrome of inappropriate ADH production) (HCC)   . TIA (transient ischemic attack)     Patient Active Problem List   Diagnosis Date Noted  . CHF (congestive heart failure) (HCC) 05/01/2016  . Chronic diastolic heart failure (HCC) 04/16/2016  . HTN (hypertension) 04/16/2016  . Hypoxia   . SOB (shortness of breath)   . Collapse of left lung   . Pleural effusion   . Centrilobular emphysema (HCC)   . Hyponatremia   . Pressure ulcer 02/19/2016  . Atrial fibrillation with RVR (HCC) 12/11/2015   . Urge incontinence 09/26/2015  . Lichenified rash 09/26/2015  . Syncopal episodes 09/25/2015    Past Surgical History:  Procedure Laterality Date  . ABDOMINAL HYSTERECTOMY  1966    Prior to Admission medications   Medication Sig Start Date End Date Taking? Authorizing Provider  acetaminophen (TYLENOL) 650 MG CR tablet Take 650 mg by mouth every 8 (eight) hours as needed for pain.    Historical Provider, MD  albuterol (PROVENTIL HFA;VENTOLIN HFA) 108 (90 BASE) MCG/ACT inhaler Inhale 1-2 puffs into the lungs every 4 (four) hours as needed for wheezing or shortness of breath.    Historical Provider, MD  aspirin EC 81 MG tablet Take 81 mg by mouth every evening.     Historical Provider, MD  cholecalciferol (VITAMIN D) 1000 UNITS tablet Take 1,000 Units by mouth daily.    Historical Provider, MD  cloNIDine (CATAPRES) 0.1 MG tablet Take 0.1 mg by mouth 3 (three) times daily.    Historical Provider, MD  clopidogrel (PLAVIX) 75 MG tablet Take 75 mg by mouth daily with breakfast.     Historical Provider, MD  diltiazem (CARDIZEM CD) 120 MG 24 hr capsule Take 1 capsule (120 mg total) by mouth daily. Patient taking differently: Take 120 mg by mouth daily with lunch.  12/15/15   Wyatt Hasteavid K Hower, MD  docusate sodium (COLACE) 100 MG capsule Take 100 mg by mouth 2 (  two) times daily.     Historical Provider, MD  feeding supplement, ENSURE ENLIVE, (ENSURE ENLIVE) LIQD Take 237 mLs by mouth daily.     Historical Provider, MD  ferrous sulfate 325 (65 FE) MG tablet Take 325 mg by mouth daily.     Historical Provider, MD  fluticasone (FLONASE) 50 MCG/ACT nasal spray Place 2 sprays into both nostrils every morning.     Historical Provider, MD  fluticasone (FLOVENT HFA) 110 MCG/ACT inhaler Inhale 2 puffs into the lungs 2 (two) times daily. 1610,9604    Historical Provider, MD  furosemide (LASIX) 20 MG tablet Take 1 tablet (20 mg total) by mouth daily. Patient taking differently: Take 20 mg by mouth daily. Take 2  tablets daily on M, W, F and 1 tablet daily on T, Thu, Sat, Sun 01/30/16   Houston Siren, MD  guaiFENesin-dextromethorphan (ROBITUSSIN DM) 100-10 MG/5ML syrup Take 10 mLs by mouth every 6 (six) hours as needed for cough. 05/04/16   Ramonita Lab, MD  levothyroxine (SYNTHROID, LEVOTHROID) 125 MCG tablet Take 125 mcg by mouth daily before breakfast.    Historical Provider, MD  LORazepam (ATIVAN) 0.5 MG tablet Take 1 tablet (0.5 mg total) by mouth 3 (three) times daily. Patient taking differently: Take 0.5 mg by mouth 3 (three) times daily. [id number] 02/25/16   Houston Siren, MD  losartan (COZAAR) 50 MG tablet Take 50 mg by mouth daily.    Historical Provider, MD  Multiple Vitamin (MULTIVITAMIN WITH MINERALS) TABS tablet Take 1 tablet by mouth daily.    Historical Provider, MD  Oxybutynin Chloride (GELNIQUE) 10 % GEL Place 1 application onto the skin daily. 05/25/16   Carollee Herter A McGowan, PA-C  polyethylene glycol (MIRALAX / GLYCOLAX) packet Take 17 g by mouth daily. *Mix in 4-8 ounces of fluid prior to taking*    Historical Provider, MD  ranitidine (ZANTAC) 150 MG tablet Take 150 mg by mouth 2 (two) times daily before a meal.     Historical Provider, MD  sodium bicarbonate 325 MG tablet Take 325 mg by mouth 4 (four) times daily.    Historical Provider, MD  spironolactone (ALDACTONE) 25 MG tablet Take 25 mg by mouth daily.    Historical Provider, MD  tiotropium (SPIRIVA) 18 MCG inhalation capsule Place 18 mcg into inhaler and inhale daily.    Historical Provider, MD  vitamin B-12 (CYANOCOBALAMIN) 1000 MCG tablet Take 1,000 mcg by mouth daily.    Historical Provider, MD    Allergies  Allergen Reactions  . Aspirin Other (See Comments)    Upset stomach with high doses  . Codeine Other (See Comments)    Reaction:  Unknown   . Ditropan [Oxybutynin] Other (See Comments)    Reaction:  Unknown   . Hctz [Hydrochlorothiazide] Other (See Comments)    Reaction:  Causes pts sodium/potassium levels to  drop significantly   . Metronidazole Other (See Comments)    Reaction:  Unknown   . Propranolol Other (See Comments)    Reaction:  Unknown   . Tavist [Clemastine] Other (See Comments)    Reaction:  Unknown   . Tramadol Other (See Comments)    Reaction:  Unknown   . Vicodin [Hydrocodone-Acetaminophen] Other (See Comments)    Reaction:  Unknown     Family History  Problem Relation Age of Onset  . Leukemia Mother   . Heart disease Father   . Hypertension      Social History Social History  Substance Use Topics  . Smoking  status: Never Smoker  . Smokeless tobacco: Never Used  . Alcohol use No    Review of Systems  Constitutional: Negative for fever. Eyes: Right eye blurry vision yesterday, right peripheral field deficit today. ENT: Negative for sore throat. Cardiovascular: Negative for chest pain. Respiratory: Negative for shortness of breath. Gastrointestinal: Negative for abdominal pain, vomiting and diarrhea. Genitourinary: Negative for dysuria. Musculoskeletal: Negative for back pain. Skin: Negative for rash. Neurological: Negative for headache. 10 point Review of Systems otherwise negative ____________________________________________   PHYSICAL EXAM:  VITAL SIGNS: ED Triage Vitals  Enc Vitals Group     BP 08/20/16 1222 (!) 157/111     Pulse Rate 08/20/16 1222 (!) 57     Resp 08/20/16 1222 18     Temp 08/20/16 1222 98.1 F (36.7 C)     Temp Source 08/20/16 1222 Oral     SpO2 08/20/16 1351 100 %     Weight 08/20/16 1224 104 lb (47.2 kg)     Height 08/20/16 1224 4\' 9"  (1.448 m)     Head Circumference --      Peak Flow --      Pain Score --      Pain Loc --      Pain Edu? --      Excl. in GC? --      Constitutional: Alert and oriented. Well appearing and in no distress. HEENT   Head: Normocephalic and atraumatic.      Eyes: Conjunctivae are normal. PERRL. Normal extraocular movements.  Right lateral upper and lower visual field deficit      Ears:          Nose: No congestion/rhinnorhea.   Mouth/Throat: Mucous membranes are moist.   Neck: No stridor. Cardiovascular/Chest: Normal rate, regular rhythm.  No murmurs, rubs, or gallops. Respiratory: Normal respiratory effort without tachypnea nor retractions. Mild decreased breath sounds at the bases, mild rhonchi without wheezing or rales. Gastrointestinal: Soft. No distention, no guarding, no rebound. Nontender.    Genitourinary/rectal:Deferred Musculoskeletal: Nontender with normal range of motion in all extremities. No joint effusions.  No lower extremity tenderness.  No edema. Neurologic:  Normal speech and language. No strength or sensory deficits are appreciated.  No facial droop.  Right lateral visual field deficit. Skin:  Skin is warm, dry and intact. No rash noted. Psychiatric: Mood and affect are normal. Speech and behavior are normal. Patient exhibits appropriate insight and judgment.   ____________________________________________  LABS (pertinent positives/negatives)  Labs Reviewed  APTT - Abnormal; Notable for the following:       Result Value   aPTT 39 (*)    All other components within normal limits  CBC - Abnormal; Notable for the following:    WBC 11.2 (*)    RBC 3.43 (*)    Hemoglobin 10.1 (*)    HCT 29.1 (*)    RDW 15.1 (*)    All other components within normal limits  DIFFERENTIAL - Abnormal; Notable for the following:    Neutro Abs 9.5 (*)    Lymphs Abs 0.7 (*)    All other components within normal limits  COMPREHENSIVE METABOLIC PANEL - Abnormal; Notable for the following:    Sodium 130 (*)    Chloride 90 (*)    CO2 33 (*)    Glucose, Bld 102 (*)    ALT 12 (*)    All other components within normal limits  PROTIME-INR  TROPONIN I  URINALYSIS, ROUTINE W REFLEX MICROSCOPIC    ____________________________________________  EKG I, Governor Rooks, MD, the attending physician have personally viewed and interpreted all ECGs.  69 bpm. Atrial  fibrillation. Narrow QRS. Nonspecific ST and T-wave ____________________________________________  RADIOLOGY All Xrays were viewed by me. Imaging interpreted by Radiologist.  CT without contrast:  IMPRESSION: Acute to subacute left occipital infarct. __________________________________________  PROCEDURES  Procedure(s) performed: None  Critical Care performed: None  ____________________________________________   ED COURSE / ASSESSMENT AND PLAN  Pertinent labs & imaging results that were available during my care of the patient were reviewed by me and considered in my medical decision making (see chart for details).   Presenting symptoms concerning for stroke, patient obtained orders for a stat head CT. She is outside the window for TPA due to onset/last seen normal yesterday evening.  CT head confirms acute to subacute stroke. She is currently on aspirin and Plavix for history of TIA. We'll defer additional blood thinning choice to neurologist/hospitalist team.  Spoke with Dr. Thad Ranger, recommended no change at present.  Will see in the hospital.    CONSULTATIONS:  Hospitalist for admission. Neurologist, Dr. Thad Ranger.   Patient / Family / Caregiver informed of clinical course, medical decision-making process, and agree with plan.   ___________________________________________   FINAL CLINICAL IMPRESSION(S) / ED DIAGNOSES   Final diagnoses:  Acute ischemic stroke [facility])              Note: This dictation was prepared with Dragon dictation. Any transcriptional errors that result from this process are unintentional    Governor Rooks, MD 08/20/16 1429

## 2016-08-20 NOTE — Progress Notes (Signed)
PHARMACY NOTE:  RENAL DOSAGE ADJUSTMENT  Famotidine was dose adjusted from twice daily to once daily based on renal function.  Gardner CandleSheema M Loella Hickle, PharmD, BCPS Clinical Pharmacist 08/20/2016 7:42 PM

## 2016-08-21 ENCOUNTER — Inpatient Hospital Stay: Payer: Medicare Other

## 2016-08-21 ENCOUNTER — Encounter: Payer: Self-pay | Admitting: *Deleted

## 2016-08-21 DIAGNOSIS — I639 Cerebral infarction, unspecified: Secondary | ICD-10-CM

## 2016-08-21 LAB — URINALYSIS, ROUTINE W REFLEX MICROSCOPIC
Bilirubin Urine: NEGATIVE
GLUCOSE, UA: NEGATIVE mg/dL
HGB URINE DIPSTICK: NEGATIVE
KETONES UR: NEGATIVE mg/dL
Leukocytes, UA: NEGATIVE
NITRITE: NEGATIVE
PH: 8 (ref 5.0–8.0)
Protein, ur: NEGATIVE mg/dL
Specific Gravity, Urine: 1.014 (ref 1.005–1.030)
Squamous Epithelial / LPF: NONE SEEN

## 2016-08-21 LAB — CBC
HCT: 29.5 % — ABNORMAL LOW (ref 35.0–47.0)
HEMOGLOBIN: 10 g/dL — AB (ref 12.0–16.0)
MCH: 29.3 pg (ref 26.0–34.0)
MCHC: 34 g/dL (ref 32.0–36.0)
MCV: 86 fL (ref 80.0–100.0)
Platelets: 283 10*3/uL (ref 150–440)
RBC: 3.43 MIL/uL — AB (ref 3.80–5.20)
RDW: 14.7 % — ABNORMAL HIGH (ref 11.5–14.5)
WBC: 10.9 10*3/uL (ref 3.6–11.0)

## 2016-08-21 LAB — HEMOGLOBIN A1C
HEMOGLOBIN A1C: 5.6 % (ref 4.8–5.6)
MEAN PLASMA GLUCOSE: 114 mg/dL

## 2016-08-21 LAB — BASIC METABOLIC PANEL
ANION GAP: 5 (ref 5–15)
BUN: 22 mg/dL — ABNORMAL HIGH (ref 6–20)
CALCIUM: 9.3 mg/dL (ref 8.9–10.3)
CO2: 32 mmol/L (ref 22–32)
Chloride: 91 mmol/L — ABNORMAL LOW (ref 101–111)
Creatinine, Ser: 0.69 mg/dL (ref 0.44–1.00)
GFR calc Af Amer: >60 mL/min (ref >60)
Glucose, Bld: 105 mg/dL — ABNORMAL HIGH (ref 65–99)
Potassium: 4.8 mmol/L (ref 3.5–5.1)
SODIUM: 128 mmol/L — AB (ref 135–145)

## 2016-08-21 LAB — ECHOCARDIOGRAM COMPLETE
HEIGHTINCHES: 57 in
WEIGHTICAEL: 1664 [oz_av]

## 2016-08-21 LAB — GLUCOSE, CAPILLARY: GLUCOSE-CAPILLARY: 96 mg/dL (ref 65–99)

## 2016-08-21 MED ORDER — BENZOCAINE 10 % MT GEL
Freq: Three times a day (TID) | OROMUCOSAL | Status: DC | PRN
  Filled 2016-08-21: qty 9.4

## 2016-08-21 MED ORDER — ATORVASTATIN CALCIUM 20 MG PO TABS
40.0000 mg | ORAL_TABLET | Freq: Every day | ORAL | Status: DC
  Administered 2016-08-21: 40 mg via ORAL
  Filled 2016-08-21: qty 2

## 2016-08-21 MED ORDER — GADOBENATE DIMEGLUMINE 529 MG/ML IV SOLN
10.0000 mL | Freq: Once | INTRAVENOUS | Status: AC | PRN
  Administered 2016-08-21: 10:00:00 9 mL via INTRAVENOUS

## 2016-08-21 NOTE — Consult Note (Signed)
Referring Physician: Nemiah CommanderKalisetti    Chief Complaint: Right vision loss  HPI: Alyssa Landry is an 83 y.o. female with multiple medical problems and recent PNA who presented with vision changes.  Patient reports that while getting ready to go to bed on Wednesday night had acute onset of loss of vision to the right.  She has this at times with her migraines and thought it may be associated with migraine.  The next morning her symptoms persisted and she saw the ophthalmologist who referred her to the ED for further evaluation.  Initial NIHSS of 3.    Date last known well: Date: 08/19/2016 Time last known well: Time: 19:00 tPA Given: No: Outside time window  Past Medical History:  Diagnosis Date  . A-fib (HCC)   . Acid reflux   . Anemia   . Anxiety   . Arthritis   . Asthma   . Chronic hyponatremia   . COPD (chronic obstructive pulmonary disease) (HCC)   . GERD (gastroesophageal reflux disease)   . Hiatal hernia   . Hiatal hernia   . Hyperlipidemia   . Hypertension   . Hypothyroidism   . Incontinence   . Migraine   . Osteoporosis, post-menopausal   . Persistent cough   . Pulmonary fibrosis (HCC)   . SIADH (syndrome of inappropriate ADH production) (HCC)   . TIA (transient ischemic attack)     Past Surgical History:  Procedure Laterality Date  . ABDOMINAL HYSTERECTOMY  1966    Family History  Problem Relation Age of Onset  . Leukemia Mother   . Heart disease Father   . Hypertension     Social History:  reports that she has never smoked. She has never used smokeless tobacco. She reports that she does not drink alcohol or use drugs.  Allergies:  Allergies  Allergen Reactions  . Aspirin Other (See Comments)    Upset stomach with high doses  . Codeine Other (See Comments)    Reaction:  Unknown   . Ditropan [Oxybutynin] Other (See Comments)    Reaction:  Unknown   . Hctz [Hydrochlorothiazide] Other (See Comments)    Reaction:  Causes pts sodium/potassium levels to drop  significantly   . Metronidazole Other (See Comments)    Reaction:  Unknown   . Propranolol Other (See Comments)    Reaction:  Unknown   . Tavist [Clemastine] Other (See Comments)    Reaction:  Unknown   . Tramadol Other (See Comments)    Reaction:  Unknown   . Vicodin [Hydrocodone-Acetaminophen] Other (See Comments)    Reaction:  Unknown     Medications:  I have reviewed the patient's current medications. Prior to Admission:  Prescriptions Prior to Admission  Medication Sig Dispense Refill Last Dose  . acetaminophen (TYLENOL) 650 MG CR tablet Take 650 mg by mouth every 8 (eight) hours as needed for pain.   prn at prn  . albuterol (PROVENTIL HFA;VENTOLIN HFA) 108 (90 BASE) MCG/ACT inhaler Inhale 1-2 puffs into the lungs every 4 (four) hours as needed for wheezing or shortness of breath.   prn at prn  . aspirin EC 81 MG tablet Take 81 mg by mouth every evening.    08/19/2016 at 1700  . cholecalciferol (VITAMIN D) 1000 UNITS tablet Take 1,000 Units by mouth daily.   08/20/2016 at 0800  . cloNIDine (CATAPRES) 0.1 MG tablet Take 0.1 mg by mouth 3 (three) times daily.   08/20/2016 at 0600  . clopidogrel (PLAVIX) 75 MG tablet Take  75 mg by mouth daily with breakfast.    08/20/2016 at 0800  . diltiazem (CARDIZEM CD) 120 MG 24 hr capsule Take 1 capsule (120 mg total) by mouth daily. 30 capsule 0 08/20/2016 at 0800  . docusate sodium (COLACE) 100 MG capsule Take 100 mg by mouth 2 (two) times daily.    08/20/2016 at 0800  . feeding supplement, ENSURE ENLIVE, (ENSURE ENLIVE) LIQD Take 237 mLs by mouth daily.    08/20/2016 at 1000  . ferrous sulfate 325 (65 FE) MG tablet Take 325 mg by mouth daily.    08/20/2016 at 0800  . fluticasone (FLOVENT HFA) 110 MCG/ACT inhaler Inhale 2 puffs into the lungs 2 (two) times daily. 1610,9604   08/20/2016 at 0900  . furosemide (LASIX) 20 MG tablet Take 1 tablet (20 mg total) by mouth daily. (Patient taking differently: Take 40 mg by mouth daily. ) 30 tablet 1 08/20/2016 at 0900  .  guaiFENesin-dextromethorphan (ROBITUSSIN DM) 100-10 MG/5ML syrup Take 10 mLs by mouth every 6 (six) hours as needed for cough. 118 mL 0 prn at prn  . levothyroxine (SYNTHROID, LEVOTHROID) 125 MCG tablet Take 125 mcg by mouth daily before breakfast.   08/20/2016 at 0600  . LORazepam (ATIVAN) 0.5 MG tablet Take 1 tablet (0.5 mg total) by mouth 3 (three) times daily. (Patient taking differently: Take 0.5 mg by mouth 3 (three) times daily. 0600,1900,2100) 30 tablet 0 08/20/2016 at 0600  . losartan (COZAAR) 50 MG tablet Take 50 mg by mouth daily.   08/20/2016 at 0900  . multivitamin-lutein (OCUVITE-LUTEIN) CAPS capsule Take 2 capsules by mouth daily.   08/20/2016 at 0900  . Oxybutynin Chloride (GELNIQUE) 10 % GEL Place 1 application onto the skin daily. 30 g 6 08/19/2016 at pm  . polyethylene glycol (MIRALAX / GLYCOLAX) packet Take 17 g by mouth daily. *Mix in 4-8 ounces of fluid prior to taking*   08/20/2016 at 0900  . ranitidine (ZANTAC) 150 MG tablet Take 150 mg by mouth 2 (two) times daily before a meal.    08/19/2016 at 1700  . sodium bicarbonate 650 MG tablet Take 325 mg by mouth 4 (four) times daily.   08/20/2016 at 0800  . spironolactone (ALDACTONE) 25 MG tablet Take 25 mg by mouth daily.   08/20/2016 at 0900  . tiotropium (SPIRIVA) 18 MCG inhalation capsule Place 18 mcg into inhaler and inhale daily.   08/20/2016 at 0900  . vitamin B-12 (CYANOCOBALAMIN) 1000 MCG tablet Take 1,000 mcg by mouth daily.   08/20/2016 at 0800  . fluticasone (FLONASE) 50 MCG/ACT nasal spray Place 2 sprays into both nostrils every morning.    Not Taking at Unknown time   Scheduled: . budesonide  2 mL Inhalation BID  . cholecalciferol  1,000 Units Oral Daily  . cloNIDine  0.1 mg Oral TID  . diltiazem  120 mg Oral Daily  . docusate sodium  100 mg Oral BID  . famotidine  20 mg Oral Daily  . feeding supplement (ENSURE ENLIVE)  237 mL Oral Daily  . ferrous sulfate  325 mg Oral Daily  . furosemide  20 mg Oral Daily  . levothyroxine  125 mcg  Oral QAC breakfast  . LORazepam  0.5 mg Oral TID  . losartan  50 mg Oral Daily  . multivitamin with minerals  1 tablet Oral Daily  . Oxybutynin Chloride  1 packet Transdermal Daily  . polyethylene glycol  17 g Oral Daily  . sodium bicarbonate  325 mg Oral QID  .  sodium chloride flush  3 mL Intravenous Q12H  . spironolactone  25 mg Oral Daily  . tiotropium  18 mcg Inhalation Daily  . vitamin B-12  1,000 mcg Oral Daily    ROS: History obtained from the patient  General ROS: negative for - chills, fatigue, fever, night sweats, weight gain or weight loss Psychological ROS: negative for - behavioral disorder, hallucinations, memory difficulties, mood swings or suicidal ideation Ophthalmic ROS: negative for - blurry vision, double vision, eye pain or loss of vision ENT ROS: negative for - epistaxis, nasal discharge, oral lesions, sore throat, tinnitus or vertigo Allergy and Immunology ROS: negative for - hives or itchy/watery eyes Hematological and Lymphatic ROS: negative for - bleeding problems, bruising or swollen lymph nodes Endocrine ROS: negative for - galactorrhea, hair pattern changes, polydipsia/polyuria or temperature intolerance Respiratory ROS: negative for - cough, hemoptysis, shortness of breath or wheezing Cardiovascular ROS: negative for - chest pain, dyspnea on exertion, edema or irregular heartbeat Gastrointestinal ROS: negative for - abdominal pain, diarrhea, hematemesis, nausea/vomiting or stool incontinence Genito-Urinary ROS: negative for - dysuria, hematuria, incontinence or urinary frequency/urgency Musculoskeletal ROS: negative for - joint swelling or muscular weakness Neurological ROS: as noted in HPI Dermatological ROS: negative for rash and skin lesion changes  Physical Examination: Blood pressure 134/60, pulse 60, temperature 98.9 F (37.2 C), temperature source Oral, resp. rate 18, height 4\' 9"  (1.448 m), weight 47.2 kg (104 lb), SpO2 100 %.  HEENT-   Normocephalic, no lesions, without obvious abnormality.  Normal external eye and conjunctiva.  Normal TM's bilaterally.  Normal auditory canals and external ears. Normal external nose, mucus membranes and septum.  Normal pharynx. Cardiovascular- S1, S2 normal, pulses palpable throughout   Lungs- chest clear, no wheezing, rales, normal symmetric air entry Abdomen- soft, non-tender; bowel sounds normal; no masses,  no organomegaly Extremities- no edema Lymph-no adenopathy palpable Musculoskeletal-arthritic changes Skin-warm and dry, no hyperpigmentation, vitiligo, or suspicious lesions  Neurological Examination   Mental Status: Alert, oriented, thought content appropriate.  Speech fluent without evidence of aphasia.  Able to follow 3 step commands without difficulty. Cranial Nerves: II: Discs flat bilaterally; RHH, round, reactive to light and accommodation III,IV, VI: ptosis not present, extra-ocular motions intact bilaterally V,VII: smile symmetric, facial light touch sensation normal bilaterally VIII: hearing normal bilaterally IX,X: gag reflex present XI: bilateral shoulder shrug XII: midline tongue extension Motor: Right : Upper extremity   5/5    Left:     Upper extremity   5/5  Lower extremity   5/5     Lower extremity   5/5 Tone and bulk:normal tone throughout; no atrophy noted Sensory: Pinprick and light touch intact throughout, bilaterally Deep Tendon Reflexes: 2+ in the upper extremities, trace at the knees and absent at the ankles Plantars: Right: downgoing   Left: upgoing Cerebellar: Normal finger-to-nose and normal heel-to-shin testing bilaterally Gait: not tested due to safety concerns    Laboratory Studies:  Basic Metabolic Panel:  Recent Labs Lab 08/20/16 1231 08/21/16 0619  NA 130* 128*  K 4.2 4.8  CL 90* 91*  CO2 33* 32  GLUCOSE 102* 105*  BUN 20 22*  CREATININE 0.69 0.69  CALCIUM 9.0 9.3    Liver Function Tests:  Recent Labs Lab 08/20/16 1231   AST 16  ALT 12*  ALKPHOS 92  BILITOT 0.6  PROT 7.0  ALBUMIN 3.9   No results for input(s): LIPASE, AMYLASE in the last 168 hours. No results for input(s): AMMONIA in the last 168  hours.  CBC:  Recent Labs Lab 08/20/16 1231 08/21/16 0619  WBC 11.2* 10.9  NEUTROABS 9.5*  --   HGB 10.1* 10.0*  HCT 29.1* 29.5*  MCV 84.9 86.0  PLT 297 283    Cardiac Enzymes:  Recent Labs Lab 08/20/16 1231  TROPONINI <0.03    BNP: Invalid input(s): POCBNP  CBG:  Recent Labs Lab 08/20/16 1719 08/21/16 1124  GLUCAP 112* 96    Microbiology: Results for orders placed or performed during the hospital encounter of 05/01/16  MRSA PCR Screening     Status: Abnormal   Collection Time: 05/01/16  7:27 PM  Result Value Ref Range Status   MRSA by PCR POSITIVE (A) NEGATIVE Final    Comment:        The GeneXpert MRSA Assay (FDA approved for NASAL specimens only), is one component of a comprehensive MRSA colonization surveillance program. It is not intended to diagnose MRSA infection nor to guide or monitor treatment for MRSA infections. RESULT CALLED TO, READ BACK BY AND VERIFIED WITH: MARCEL TURNER AT 2042 05/01/16.PMH     Coagulation Studies:  Recent Labs  08/20/16 1231  LABPROT 13.0  INR 0.98    Urinalysis:  Recent Labs Lab 08/21/16 0400  COLORURINE YELLOW*  LABSPEC 1.014  PHURINE 8.0  GLUCOSEU NEGATIVE  HGBUR NEGATIVE  BILIRUBINUR NEGATIVE  KETONESUR NEGATIVE  PROTEINUR NEGATIVE  NITRITE NEGATIVE  LEUKOCYTESUR NEGATIVE    Lipid Panel:    Component Value Date/Time   CHOL 142 08/20/2016 1231   CHOL 220 (H) 08/10/2011 0619   TRIG 50 08/20/2016 1231   TRIG 73 08/10/2011 0619   HDL 67 08/20/2016 1231   HDL 106 (H) 08/10/2011 0619   CHOLHDL 2.1 08/20/2016 1231   VLDL 10 08/20/2016 1231   VLDL 15 08/10/2011 0619   LDLCALC 65 08/20/2016 1231   LDLCALC 99 08/10/2011 0619    HgbA1C:  Lab Results  Component Value Date   HGBA1C 5.6 08/20/2016     Urine Drug Screen:  No results found for: LABOPIA, COCAINSCRNUR, LABBENZ, AMPHETMU, THCU, LABBARB  Alcohol Level: No results for input(s): ETH in the last 168 hours.    Imaging: Ct Head Wo Contrast  Result Date: 08/20/2016 CLINICAL DATA:  Visual loss.  Migraines. EXAM: CT HEAD WITHOUT CONTRAST TECHNIQUE: Contiguous axial images were obtained from the base of the skull through the vertex without intravenous contrast. COMPARISON:  06/26/2015 FINDINGS: Brain: There is low-density noted in the left occipital lobe compatible with acute to subacute infarction. No hemorrhage. No additional area of infarction. No hydrocephalus. Vascular: No hyperdense vessel or unexpected calcification. Skull: No acute calvarial abnormality. Sinuses/Orbits: Visualized paranasal sinuses and mastoids clear. Orbital soft tissues unremarkable. Other: None IMPRESSION: Acute to subacute left occipital infarct. Electronically Signed   By: Charlett Nose M.D.   On: 08/20/2016 13:06   Mr Laqueta Jean ZO Contrast  Result Date: 08/21/2016 CLINICAL DATA:  Cerebral infarction. EXAM: MRI HEAD WITHOUT AND WITH CONTRAST TECHNIQUE: Multiplanar, multiecho pulse sequences of the brain and surrounding structures were obtained without and with intravenous contrast. CONTRAST:  9mL MULTIHANCE GADOBENATE DIMEGLUMINE 529 MG/ML IV SOLN COMPARISON:  CT head without contrast 08/20/2016. MRI brain 09/26/2015 FINDINGS: Brain: The diffusion-weighted images demonstrate restricted diffusion within the left occipital lobe. Susceptibility suggest blood products within the cortical regions. There is also mild enhancement. No enhancing mass lesion is present. Mild atrophy is within normal limits for age. Moderate diffuse white matter changes are seen bilaterally. No other focal cortical infarct is present.  Vascular: Flow is present in the major intracranial arteries. Skull and upper cervical spine: The skullbase is within normal limits. Midline sagittal structures are  unremarkable. Grade 1 anterolisthesis at C3-4 measures 2 mm. Marrow signal is normal. Sinuses/Orbits: The paranasal sinuses and mastoid air cells are clear. Bilateral lens replacements are present. The globes and orbits are otherwise within normal limits. IMPRESSION: 1. Acute/subacute left occipital lobe infarct with hemorrhagic conversion. 2. Stable atrophy and moderate diffuse white matter disease otherwise. Electronically Signed   By: Marin Roberts M.D.   On: 08/21/2016 10:52   US Carotid Bilateral  Result Date: 08/21/2016 CLINICAL DATA:  Stroke; right side visual disturbance. Hypertension, hyperlipidemia. EXAM: BILATERAL CAROTID DUPLEX ULTRASOUND TECHNIQUE: Wallace Cullens scale imaging, color Doppler and duplex ultrasound was performed of bilateral carotid and vertebral arteries in the neck. COMPARISON:  06/26/2016 TECHNIQUE: Quantification of carotid stenosis is based on velocity parameters that correlate the residual internal carotid diameter with NASCET-based stenosis levels, using the diameter of the distal internal carotid lumen as the denominator for stenosis measurement. The following velocity measurements were obtained: PEAK SYSTOLIC/END DIASTOLIC RIGHT ICA:                     85/15cm/sec CCA:                     67/10cm/sec SYSTOLIC ICA/CCA RATIO:  1.3 DIASTOLIC ICA/CCA RATIO: 1.5 ECA:                     64cm/sec LEFT ICA:                     80/18cm/sec CCA:                     81/11cm/sec SYSTOLIC ICA/CCA RATIO:  1.0 DIASTOLIC ICA/CCA RATIO: 1.6 ECA:                     72cm/sec FINDINGS: RIGHT CAROTID ARTERY: Partially calcified plaque in the carotid bulb and proximal ICA. No high-grade stenosis. Normal waveforms and color Doppler signal. RIGHT VERTEBRAL ARTERY:  Normal flow direction and waveform. LEFT CAROTID ARTERY: Partially calcified plaque in the bulb and proximal ICA. No high-grade stenosis. Normal waveforms and color Doppler signal. LEFT VERTEBRAL ARTERY: Normal flow direction and waveform.  IMPRESSION: 1. Bilateral carotid bifurcation and proximal ICA plaque resulting in less than 50% diameter stenosis. The exam does not exclude plaque ulceration or embolization. Continued surveillance recommended. 2.  Antegrade bilateral vertebral arterial flow. Electronically Signed   By: Corlis Leak M.D.   On: 08/21/2016 11:09    Assessment: 83 y.o. female presenting with visual complaints.  Has [facility] on neurological examination.  MRI of the brain reviewed and shows a left PCA infarct with evidence of hemorrhagic transformation.  Etiology likely embolic.  Although with a history of atrial fibrillation patient not a candidate for anticoagulation due to hemorrhagic transformation.  Carotid dopplers show no evidence of hemodynamically significant stenosis.  Echocardiogram pending.  A1c 5.6, LDL 65.  Patient on ASA and Plavix at home.    Stroke Risk Factors - atrial fibrillation, hyperlipidemia and hypertension  Plan: 1. D/C ASA. Plavix and SQ heparin 2. SCDs to be placed 3. PT consult, OT consult, Speech consult 4. Echocardiogram pending 5. Prophylactic therapy-None 6. NPO until RN stroke swallow screen 7. Telemetry monitoring 8. Frequent neuro checks 9. Repeat head CT in AM to follow progression of hemorrhage   Verlon Au  Thad Ranger, MD Neurology [phone] 08/21/2016, 11:36 AM

## 2016-08-21 NOTE — Progress Notes (Signed)
OT Cancellation Note  Patient Details Name: Alyssa Landry MRN: [id number] DOB: 1919-03-08   Cancelled Treatment:    Reason Eval/Treat Not Completed: Medical issues which prohibited therapy (Imaging showed a left occipital Infarct with Hemorrhagic Conversion. Pt. is scheduled for a repeat CT in 24 hours to monitor hemorrhage. Will hold OT eval, monitor pt., and evaluate when medically appropriate,.)  Olegario MessierElaine Parul Porcelli, MS, OTR/L 08/21/2016, 4:23 PM

## 2016-08-21 NOTE — Progress Notes (Signed)
PT Cancellation Note  Patient Details Name: Alyssa Landry MRN: [id number] DOB: [birth year]   Cancelled Treatment:    Reason Eval/Treat Not Completed: Patient at procedure or test/unavailable (chart reviewed, pt out of room for imaging, will check back later )   Riccardo DubinQuinton Hanae Waiters Student PT 08/21/2016, 10:08 AM

## 2016-08-21 NOTE — Progress Notes (Signed)
PT Cancellation Note  Patient Details Name: Alyssa Landry MRN: [id number] DOB: Jun 28, 1919   Cancelled Treatment:    Reason Eval/Treat Not Completed: Medical issues which prohibited therapy (Patient returned to floor post-imaging.  MRI significant for L occipital infarct with hemorrahagic conversion.  Scheduled for repeat CT in 24 hours to evaluate stability of bleed.  Will hold PT evaluation until repeat CT completed and patient cleared for exertional activity.)   Sia Gabrielsen H. Manson PasseyBrown, PT, DPT, NCS 08/21/16, 2:26 PM [phone]

## 2016-08-21 NOTE — Progress Notes (Addendum)
[facility] [place] at [facility]   PATIENT NAME: Alyssa Landry    MR#:  [id number]  DATE OF BIRTH:  [birth year]  SUBJECTIVE:  CHIEF COMPLAINT:   Chief Complaint  Patient presents with  . Visual Field Change   - right visual field defects persists, denies any other complains - mouth ulcers started prior to admission  REVIEW OF SYSTEMS:  Review of Systems  Constitutional: Negative for chills, fever and malaise/fatigue.  HENT: Negative for ear discharge, ear pain, hearing loss and nosebleeds.   Eyes: Positive for blurred vision.  Respiratory: Negative for cough, shortness of breath and wheezing.   Cardiovascular: Negative for chest pain, palpitations and leg swelling.  Gastrointestinal: Negative for abdominal pain, constipation, diarrhea, nausea and vomiting.  Genitourinary: Negative for dysuria and urgency.  Musculoskeletal: Negative for myalgias and neck pain.  Neurological: Negative for dizziness, sensory change, speech change, focal weakness, seizures and headaches.       Right homonymous hemoanopsia  Psychiatric/Behavioral: Negative for depression.    DRUG ALLERGIES:   Allergies  Allergen Reactions  . Aspirin Other (See Comments)    Upset stomach with high doses  . Codeine Other (See Comments)    Reaction:  Unknown   . Ditropan [Oxybutynin] Other (See Comments)    Reaction:  Unknown   . Hctz [Hydrochlorothiazide] Other (See Comments)    Reaction:  Causes pts sodium/potassium levels to drop significantly   . Metronidazole Other (See Comments)    Reaction:  Unknown   . Propranolol Other (See Comments)    Reaction:  Unknown   . Tavist [Clemastine] Other (See Comments)    Reaction:  Unknown   . Tramadol Other (See Comments)    Reaction:  Unknown   . Vicodin [Hydrocodone-Acetaminophen] Other (See Comments)    Reaction:  Unknown     VITALS:  Blood pressure 136/62, pulse 60, temperature 98.9 F (37.2 C), temperature source Oral, resp. rate  18, height 4\' 9"  (1.448 m), weight 47.2 kg (104 lb), SpO2 100 %.  PHYSICAL EXAMINATION:  Physical Exam  GENERAL:  83 y.o.-year-old elderly patient lying in the bed with no acute distress.  EYES: Pupils equal, round, reactive to light and accommodation. No scleral icterus. Extraocular muscles intact.  HEENT: Head atraumatic, normocephalic. Oropharynx and nasopharynx clear.  NECK:  Supple, no jugular venous distention. No thyroid enlargement, no tenderness.  LUNGS: Normal breath sounds bilaterally, no wheezing, rales,rhonchi or crepitation. No use of accessory muscles of respiration. Decreased bibasilar breath sounds CARDIOVASCULAR: S1, S2 normal. No  rubs, or gallops. 3/6 loud systolic murmur heard ABDOMEN: Soft, nontender, nondistended. Bowel sounds present. No organomegaly or mass.  EXTREMITIES: No pedal edema, cyanosis, or clubbing.  NEUROLOGIC: Cranial nerves II through XII are intact except right homonymous hemianopsia. Muscle strength 5/5 in all extremities. Sensation intact. Gait not checked.  PSYCHIATRIC: The patient is alert and oriented x 3.  SKIN: No obvious rash, lesion, or ulcer.    LABORATORY PANEL:   CBC  Recent Labs Lab 08/21/16 0619  WBC 10.9  HGB 10.0*  HCT 29.5*  PLT 283   ------------------------------------------------------------------------------------------------------------------  Chemistries   Recent Labs Lab 08/20/16 1231 08/21/16 0619  NA 130* 128*  K 4.2 4.8  CL 90* 91*  CO2 33* 32  GLUCOSE 102* 105*  BUN 20 22*  CREATININE 0.69 0.69  CALCIUM 9.0 9.3  AST 16  --   ALT 12*  --   ALKPHOS 92  --   BILITOT 0.6  --    ------------------------------------------------------------------------------------------------------------------  Cardiac Enzymes  Recent Labs Lab 08/20/16 1231  TROPONINI <0.03   ------------------------------------------------------------------------------------------------------------------  RADIOLOGY:  Ct Head  Wo Contrast  Result Date: 08/20/2016 CLINICAL DATA:  Visual loss.  Migraines. EXAM: CT HEAD WITHOUT CONTRAST TECHNIQUE: Contiguous axial images were obtained from the base of the skull through the vertex without intravenous contrast. COMPARISON:  06/26/2015 FINDINGS: Brain: There is low-density noted in the left occipital lobe compatible with acute to subacute infarction. No hemorrhage. No additional area of infarction. No hydrocephalus. Vascular: No hyperdense vessel or unexpected calcification. Skull: No acute calvarial abnormality. Sinuses/Orbits: Visualized paranasal sinuses and mastoids clear. Orbital soft tissues unremarkable. Other: None IMPRESSION: Acute to subacute left occipital infarct. Electronically Signed   By: Charlett Nose M.D.   On: 08/20/2016 13:06   Mr Laqueta Jean ZO Contrast  Result Date: 08/21/2016 CLINICAL DATA:  Cerebral infarction. EXAM: MRI HEAD WITHOUT AND WITH CONTRAST TECHNIQUE: Multiplanar, multiecho pulse sequences of the brain and surrounding structures were obtained without and with intravenous contrast. CONTRAST:  9mL MULTIHANCE GADOBENATE DIMEGLUMINE 529 MG/ML IV SOLN COMPARISON:  CT head without contrast 08/20/2016. MRI brain 09/26/2015 FINDINGS: Brain: The diffusion-weighted images demonstrate restricted diffusion within the left occipital lobe. Susceptibility suggest blood products within the cortical regions. There is also mild enhancement. No enhancing mass lesion is present. Mild atrophy is within normal limits for age. Moderate diffuse white matter changes are seen bilaterally. No other focal cortical infarct is present. Vascular: Flow is present in the major intracranial arteries. Skull and upper cervical spine: The skullbase is within normal limits. Midline sagittal structures are unremarkable. Grade 1 anterolisthesis at C3-4 measures 2 mm. Marrow signal is normal. Sinuses/Orbits: The paranasal sinuses and mastoid air cells are clear. Bilateral lens replacements are present.  The globes and orbits are otherwise within normal limits. IMPRESSION: 1. Acute/subacute left occipital lobe infarct with hemorrhagic conversion. 2. Stable atrophy and moderate diffuse white matter disease otherwise. Electronically Signed   By: Marin Roberts M.D.   On: 08/21/2016 10:52   US Carotid Bilateral  Result Date: 08/21/2016 CLINICAL DATA:  Stroke; right side visual disturbance. Hypertension, hyperlipidemia. EXAM: BILATERAL CAROTID DUPLEX ULTRASOUND TECHNIQUE: Wallace Cullens scale imaging, color Doppler and duplex ultrasound was performed of bilateral carotid and vertebral arteries in the neck. COMPARISON:  06/26/2016 TECHNIQUE: Quantification of carotid stenosis is based on velocity parameters that correlate the residual internal carotid diameter with NASCET-based stenosis levels, using the diameter of the distal internal carotid lumen as the denominator for stenosis measurement. The following velocity measurements were obtained: PEAK SYSTOLIC/END DIASTOLIC RIGHT ICA:                     85/15cm/sec CCA:                     67/10cm/sec SYSTOLIC ICA/CCA RATIO:  1.3 DIASTOLIC ICA/CCA RATIO: 1.5 ECA:                     64cm/sec LEFT ICA:                     80/18cm/sec CCA:                     81/11cm/sec SYSTOLIC ICA/CCA RATIO:  1.0 DIASTOLIC ICA/CCA RATIO: 1.6 ECA:                     72cm/sec FINDINGS: RIGHT CAROTID ARTERY: Partially calcified plaque in the carotid bulb and proximal  ICA. No high-grade stenosis. Normal waveforms and color Doppler signal. RIGHT VERTEBRAL ARTERY:  Normal flow direction and waveform. LEFT CAROTID ARTERY: Partially calcified plaque in the bulb and proximal ICA. No high-grade stenosis. Normal waveforms and color Doppler signal. LEFT VERTEBRAL ARTERY: Normal flow direction and waveform. IMPRESSION: 1. Bilateral carotid bifurcation and proximal ICA plaque resulting in less than 50% diameter stenosis. The exam does not exclude plaque ulceration or embolization. Continued  surveillance recommended. 2.  Antegrade bilateral vertebral arterial flow. Electronically Signed   By: Corlis Leak  Hassell M.D.   On: 08/21/2016 11:09    EKG:   Orders placed or performed during the hospital encounter of 08/20/16  . ED EKG  . ED EKG    ASSESSMENT AND PLAN:   83 y/o [place] female with past medical history significant for atrial fibrillation not on anticoagulation, pulmonary fibrosis and chronically 2 L oxygen, osteoporosis, migraine headaches, hiatal hernia, reflux disease was in stool hospital secondary to right-sided homonymous hemianopsia.  #1 acute occipital CVA-MRI of the brain showing left occipital infarct with hemorrhagic conversion. -Patient still has persistent visual field defect on the right side. -Carotid Dopplers with no hemodynamically significant stenosis. -Due to the hemorrhagic conversion, DC aspirin and Plavix according to neurology. -started statin. Echocardiogram is pending. -Physical therapy and occupational therapy consults are pending at this time. -Repeat CT head tomorrow to monitor hemorrhage  #2 Chronic atrial fibrillation-not a candidate for anticoagulation now with hemorrhagic CVA.  - hold Plavix and aspirin for now -Rate controlled with oral medications. On oral Cardizem  #3 hypertension-continue oral Cardizem, Aldactone, clonidine and losartan  #4 pulmonary fibrosis-chronically O2 dependent. Continue at this time. Appears stable otherwise. Also on inhalers/nebulizers which will be continued.  #5 chronic hyponatremia-follows with nephrology. Supposed to be on fluid restriction. We will start that. Also on sodium bicarbonate tablets and Lasix daily. Will continue that  #6 DVT prophylaxis-Ted's and SCDs at this time. Heparin products discontinued due to hemorrhagic stroke     All the records are reviewed and case discussed with Care Management/Social Workerr. Management plans discussed with the patient, family and they are in  agreement.  CODE STATUS: Full Code  TOTAL TIME TAKING CARE OF THIS PATIENT: 38 minutes.   POSSIBLE D/C IN 1-2 DAYS, DEPENDING ON CLINICAL CONDITION.   Misk Galentine M.D on 08/21/2016 at 1:09 PM  Between 7am to 6pm - Pager - [phone]  After 6pm go to [email or web address] - Social research officer, governmentpassword EPAS [facility]  [facility]  Office  [phone]  CC: Primary care physician; Rafael BihariWALKER III, JOHN B, MD

## 2016-08-21 NOTE — Care Management (Signed)
Admitted to this facility with the diagnosis of stroke. Lives with daughter, Elease Hashimotoatricia. [phone]). Last seen Dr. Yates DecampJohn Walker November 2017. [facility] last October. [facility] in the home after being discharged from [facility]. Home oxygen per [facility] since July 2017. 2liters per nasal cannula continuous. No falls. Appetite too good per Ms. Barga. Takes care of all activities of daily living herself, doesn't drive anymore. Gave driving up at 89, worked at [employer] until age 83. [facility].  Daughter will transport Gwenette GreetBrenda S Stanley Lyness RN MSN CCM Care Management

## 2016-08-21 NOTE — Progress Notes (Signed)
OT Cancellation Note  Patient Details Name: Alyssa Landry MRN: [id number] DOB: [birth year]   Cancelled Treatment:    Reason Eval/Treat Not Completed: Patient at procedure or test/ unavailable  Olegario MessierElaine Abdimalik Mayorquin, MS, OTR/L 08/21/2016, 9:37 AM

## 2016-08-22 ENCOUNTER — Inpatient Hospital Stay: Payer: Medicare Other

## 2016-08-22 DIAGNOSIS — I612 Nontraumatic intracerebral hemorrhage in hemisphere, unspecified: Secondary | ICD-10-CM

## 2016-08-22 LAB — BASIC METABOLIC PANEL
ANION GAP: 6 (ref 5–15)
BUN: 22 mg/dL — ABNORMAL HIGH (ref 6–20)
CALCIUM: 9.1 mg/dL (ref 8.9–10.3)
CO2: 32 mmol/L (ref 22–32)
Chloride: 89 mmol/L — ABNORMAL LOW (ref 101–111)
Creatinine, Ser: 0.86 mg/dL (ref 0.44–1.00)
GFR calc Af Amer: >60 mL/min (ref >60)
GFR calc non Af Amer: 56 mL/min — ABNORMAL LOW (ref >60)
Glucose, Bld: 115 mg/dL — ABNORMAL HIGH (ref 65–99)
Potassium: 4.8 mmol/L (ref 3.5–5.1)
SODIUM: 127 mmol/L — AB (ref 135–145)

## 2016-08-22 LAB — MRSA PCR SCREENING: MRSA BY PCR: POSITIVE — AB

## 2016-08-22 LAB — SODIUM: SODIUM: 125 mmol/L — AB (ref 135–145)

## 2016-08-22 MED ORDER — ORAL CARE MOUTH RINSE
15.0000 mL | Freq: Two times a day (BID) | OROMUCOSAL | Status: DC
  Administered 2016-08-22: 15:00:00 15 mL via OROMUCOSAL

## 2016-08-22 MED ORDER — MUPIROCIN 2 % EX OINT
1.0000 "application " | TOPICAL_OINTMENT | Freq: Two times a day (BID) | CUTANEOUS | Status: DC
  Administered 2016-08-22: 1 via NASAL
  Filled 2016-08-22: qty 22

## 2016-08-22 MED ORDER — LEVOTHYROXINE SODIUM 25 MCG PO TABS: 125.0000 ug | ORAL_TABLET | Freq: Every day | ORAL | Status: DC

## 2016-08-22 MED ORDER — ATORVASTATIN CALCIUM 40 MG PO TABS: 40.0000 mg | ORAL_TABLET | Freq: Every day | ORAL | 2 refills | Status: AC

## 2016-08-22 MED ORDER — CHLORHEXIDINE GLUCONATE CLOTH 2 % EX PADS
6.0000 | MEDICATED_PAD | Freq: Every day | CUTANEOUS | Status: DC
  Administered 2016-08-22: 6 via TOPICAL

## 2016-08-22 NOTE — Evaluation (Signed)
Occupational Therapy Evaluation Patient Details Name: Allen Derrynnie C Boldin MRN: [id number] DOB: [birth year] Today's Date: 08/22/2016    History of Present Illness 83 y.o. female with a known history of A. fib, anxiety, COPD, hyponatremia, migraine, osteoporosis, pulmonary fibrosis and on chronic oxygen- she started having some pain in her right side of the head and some visual symptoms, she has had migraines frequently daughter thought it is one of those episodes and just waited overnight for it to go away. Patient denies any other symptoms of numbness weakness or speech problem. CT reveals CVA, headache is gone, still having R peripheral vision issues.   Clinical Impression   Patient evaluated by OT this date.  Patient demonstrates right visual field deficits however was able to compensate during session with head turning to be able to perform self care tasks, functional mobility with walker in tight spaces and managing longer O2 cord to go the the bathroom.  She required supervision for safety with these tasks secondary to vision.  Daughter and patient educated on potential safety issues with loss of vision and implications into daily routine and tasks and demonstrate understanding.  Patient does not need follow up OT at this time, however if she starts to show any signs of neglect or safety issues from visual field deficit, she may need a reconsult for OT.      Follow Up Recommendations  No OT follow up;Other (comment) (Pt may need follow up OT in the future if she demonstrates any safety issues with vision at home.  None noted this date with evaluation.)    Equipment Recommendations       Recommendations for Other Services       Precautions / Restrictions Precautions Precautions: Fall Restrictions Weight Bearing Restrictions: No      Mobility Bed Mobility Overal bed mobility: Independent             General bed mobility comments: Pt able to get herself sitting at EOB w/o  assist  Transfers Overall transfer level: Independent Equipment used: Rolling walker (2 wheeled)             General transfer comment: Recommend supervision for safety initially at home with functional mobility with new diagnosis of right homonymous hemianopsia    Balance Overall balance assessment: Modified Independent                                          ADL Overall ADL's : Needs assistance/impaired Eating/Feeding: Modified independent   Grooming: Modified independent   Upper Body Bathing: Modified independent   Lower Body Bathing: Supervison/ safety   Upper Body Dressing : Modified independent   Lower Body Dressing: Supervision/safety   Toilet Transfer: Supervision/safety   Toileting- Clothing Manipulation and Hygiene: Supervision/safety       Functional mobility during ADLs: Supervision/safety General ADL Comments: Recommend patient have supervision initially for safety if returning home when performing self care and functional mobility tasks secondary to right sided visual field deficits.       Vision Additional Comments: Patient diagnosed with right homonymous hemianopsia   Perception Perception Perception Tested?: Yes Comments: Patient with good proprioception, using right hand automatically with tasks, no signs this date of any body neglect, turns head to scan environment when ambulating with walker.    Praxis      Pertinent Vitals/Pain Pain Assessment: No/denies pain     Hand Dominance  Right   Extremity/Trunk Assessment Upper Extremity Assessment Upper Extremity Assessment: Overall WFL for tasks assessed   Lower Extremity Assessment Lower Extremity Assessment: Defer to PT evaluation       Communication Communication Communication: HOH;No difficulties   Cognition Arousal/Alertness: Awake/alert Behavior During Therapy: WFL for tasks assessed/performed Overall Cognitive Status: Within Functional Limits for tasks  assessed                     General Comments       Exercises       Shoulder Instructions      Home Living Family/patient expects to be discharged to:: Private residence Living Arrangements: Children Available Help at Discharge: Family Type of Home: House Home Access: Stairs to enter;Other (comment) (has a chair lift) Entrance Stairs-Number of Steps: 11   Home Layout: Two level   Alternate Level Stairs-Rails: Left Bathroom Shower/Tub: Chief Strategy Officer: Standard     Home Equipment: Environmental consultant - 2 wheels;Cane - single point          Prior Functioning/Environment Level of Independence: Independent with assistive device(s)        Comments: pt mostly uses RW, has been trying to get more comfortable with SPC        OT Problem List: Impaired vision/perception   OT Treatment/Interventions:      OT Goals(Current goals can be found in the care plan section) Acute Rehab OT Goals Patient Stated Goal: Patient would like to return home with her daughter OT Goal Formulation: With patient/family Time For Goal Achievement: 09/05/16 Potential to Achieve Goals: Good  OT Frequency:     Barriers to D/C:            Co-evaluation              End of Session Equipment Utilized During Treatment: Gait belt;Rolling walker;Oxygen  Activity Tolerance: Patient tolerated treatment well Patient left: in chair   Time: 1106-1150 OT Time Calculation (min): 44 min Charges:  OT General Charges $OT Visit: 1 Procedure OT Evaluation $OT Eval Low Complexity: 1 Procedure OT Treatments $Self Care/Home Management : 23-37 mins G-Codes:    Lovett,Amy  Amy T Lovett, OTR/L, CLT  08/22/2016, 12:36 PM

## 2016-08-22 NOTE — Progress Notes (Signed)
Pt transported to private vehicle with her 02 @ 2l/[place] accompanied by her dgt with discharge home to private care/self care. [facility] manager with dgt advised.

## 2016-08-22 NOTE — Evaluation (Signed)
Physical Therapy Evaluation Patient Details Name: Alyssa Landry MRN: [id number] DOB: November 01, 1918 Today's Date: 08/22/2016   History of Present Illness  83 y.o. female with a known history of A. fib, anxiety, COPD, hyponatremia, migraine, osteoporosis, pulmonary fibrosis and on chronic oxygen- she started having some pain in her right side of the head and some visual symptoms, she has had migraines frequently daughter thought it is one of those episodes and just waited overnight for it to go away. Patient denies any other symptoms of numbness weakness or speech problem. CT reveals CVA, headache is gone, still having R peripheral vision issues.  Clinical Impression  Pt did well with PT and is near her baseline.  She had expected age appropriate weakness, but ultimately did well and was able to do all mobility and other tasks asked of her.  She was able to ambulate in the room with O2 and RW w/o issues and showed ability to do in-home activities safely.  Pt reports that she does an exercise routine nearly everday and plans to continue with this when she gets back home.  Daughter and pt agree that she would be safe at home and does not really need further PT intervention.  Pt's only symptoms seem to be R visual field blurriness.     Follow Up Recommendations No PT follow up    Equipment Recommendations       Recommendations for Other Services       Precautions / Restrictions Precautions Precautions: Fall Restrictions Weight Bearing Restrictions: No      Mobility  Bed Mobility Overal bed mobility: Independent             General bed mobility comments: Pt able to get herself sitting at EOB w/o assist  Transfers Overall transfer level: Independent Equipment used: Rolling walker (2 wheeled)             General transfer comment: Pt able to rise to standing w/o direct assist and showed good confidence and safety  Ambulation/Gait Ambulation/Gait assistance: Modified  independent (Device/Increase time) Ambulation Distance (Feet): 70 Feet Assistive device: Rolling walker (2 wheeled)       General Gait Details: Pt did well with ambulation in room and though she may have been slightly slower than her baseline she felt confidence, showed good safety and ultiamtely did well.  Pt on 2 liters O2 t/o the effort and her sats stayed in the 90s.   Stairs            Wheelchair Mobility    Modified Rankin (Stroke Patients Only)       Balance Overall balance assessment: Modified Independent                                           Pertinent Vitals/Pain Pain Assessment: No/denies pain    Home Living Family/patient expects to be discharged to:: Private residence Living Arrangements: Children Available Help at Discharge: Family Type of Home: House Home Access: Stairs to enter (has chair lift)       Home Equipment: Environmental consultantWalker - 2 wheels;Cane - single point      Prior Function Level of Independence: Independent with assistive device(s)         Comments: pt mostly uses RW, has been trying to get more comfortable with SPC     Hand Dominance   Dominant Hand: Right    Extremity/Trunk Assessment  Upper Extremity Assessment Upper Extremity Assessment: Overall WFL for tasks assessed    Lower Extremity Assessment Lower Extremity Assessment: Overall WFL for tasks assessed       Communication   Communication: HOH;No difficulties  Cognition Arousal/Alertness: Awake/alert Behavior During Therapy: WFL for tasks assessed/performed Overall Cognitive Status: Within Functional Limits for tasks assessed                      General Comments      Exercises     Assessment/Plan    PT Assessment Patent does not need any further PT services  PT Problem List            PT Treatment Interventions      PT Goals (Current goals can be found in the Care Plan section)  Acute Rehab PT Goals Patient Stated Goal: go  home when I'm ready PT Goal Formulation: With patient    Frequency     Barriers to discharge        Co-evaluation               End of Session Equipment Utilized During Treatment: Gait belt;Oxygen (2 liters) Activity Tolerance: Patient tolerated treatment well Patient left: with chair alarm set;with call bell/phone within reach           Time: 1610-9604 PT Time Calculation (min) (ACUTE ONLY): 31 min   Charges:         PT G Codes:        Malachi Pro, DPT 08/22/2016, 11:30 AM

## 2016-08-22 NOTE — Discharge Instructions (Signed)
Hemorrhagic Stroke °A hemorrhagic stroke is the sudden death of brain tissue that occurs when a blood vessel in the brain leaks or bursts (ruptures). When this happens, certain areas of the brain do not get enough oxygen, and blood builds up and presses on certain areas of the brain (hemorrhage). Lack of oxygen and pressure from hemorrhaging can lead to brain damage. °There are two major types of hemorrhagic stroke, depending on where bleeding occurs. If bleeding occurs within the brain tissue, the condition is called an intracerebral hemorrhage. If bleeding occurs in the area between the brain and the membrane that covers the brain (subarachnoid space), the condition is called a subarachnoid hemorrhage. Hemorrhagic stroke is a medical emergency. It can cause temporary or permanent brain damage and loss of brain function. °What are the causes? °This condition is caused by a blood vessel leaking or rupturing, which may be the result of: °· Part of a weakened blood vessel wall bulging or ballooning out (cerebral aneurysm). °· A hardened, thin blood vessel cracking open and allowing blood to leak out. Blood vessels may become hardened and thin due to plaque buildup. °· Tangled blood vessels in the brain (brain arteriovenous malformation). °· Protein buildup on artery walls in the brain (amyloid angiopathy). °· Inflamed blood vessels (vasculitis). °· A tumor in the brain. °· High blood pressure (hypertension). °What increases the risk? °The following factors may make you more likely to develop this condition: °· Hypertension. °· Having abnormal blood vessels present since birth (congenital abnormality). °· Bleeding disorders, such as hemophilia, sickle cell disease, or liver disease. °· The blood becoming too thin while taking blood thinners (anticoagulants). °· Aging. °· Moderate or heavy alcohol use. °· Using drugs, such as cocaine or methamphetamines. °What are the signs or symptoms? °Symptoms of this condition  usually appear suddenly, and may include: °· Weakness or numbness of the face, arm, or leg, especially on one side of the body. °· Confusion. °· Difficulty speaking (aphasia) or understanding speech. °· Difficulty seeing out of one or both eyes. °· Difficulty walking or moving the arms or legs. °· Dizziness. °· Loss of balance or coordination. °· Seizures. °· A severe headache with no known cause. This headache may feel like the worst headache ever experienced. °How is this diagnosed? °This condition may be diagnosed based on: °· Your symptoms. °· Your medical history. °· A physical exam. °· Tests, including: °¨ Blood tests. °¨ CT scan. °¨ MRI. °· Angiogram. In this procedure, dye is injected through a long, thin tube (catheter) into one of your arteries. Then, X-rays are taken. The X-rays will show whether there is a blockage or a problem in a blood vessel. °How is this treated? °This condition is a medical emergency that must be treated in a hospital immediately. The goals of treatment are to stop bleeding, reduce pressure on the brain, and relieve symptoms. Treatment may include: °· Medicines that: °¨ Lower blood pressure (antihypertensives). °¨ Relieve pain (analgesics). °¨ Relieve nausea or vomiting. °¨ Stop or prevent seizures (anticonvulsants). °¨ Relieve fever. °¨ Prevent blood vessels in the brain from spasming in response to bleeding. °¨ Control bleeding in the brain. °· Assisted breathing (ventilation). This involves using a machine to help you breathe (ventilator). °· Receiving donated blood products through an IV tube (transfusion). You will receive cells that help your blood clot. °· Placement of a tube (shunt) in the brain to relieve pressure. °· Physical, speech, or occupational therapy. °· Surgery to stop bleeding, remove a blood   clot or tumor, or reduce pressure. Treatment depends on the cause, severity, and duration of symptoms. Medicines and changes to your diet may be used to help treat and  manage risk factors for stroke, such as diabetes and high blood pressure. Follow these instructions at home: Activity  Return to your normal activities as told by your health care provider. Ask your health care provider what activities are safe for you.  Rest. Rest helps the brain to heal. Make sure you:  Get plenty of sleep. Avoid staying up late at night.  Keep a consistent sleep schedule. Try to go to sleep and wake up at about the same time every day.  Avoid activities that cause physical or mental stress. General instructions  Take over-the-counter and prescription medicines only as told by your health care provider.  Do not drive or operate heavy machinery until your health care provider approves.  Limit alcohol intake to no more than 1 drink per day for nonpregnant women and 2 drinks per day for men. One drink equals 12 oz of beer, 5 oz of wine, or 1 oz of hard liquor.  Use a walker or a cane as told by your health care provider.  Keep all follow-up visits as told by your health care provider, including visits with therapists. This is important. How is this prevented? Your risk of stroke can be decreased by working with your health care provider to treat high blood pressure, high cholesterol, diabetes, heart disease, and obesity. Your risk of stroke can also be decreased by quitting smoking, limiting alcohol, and staying physically active. If you take the blood thinner warfarin, have your bloodwork monitored frequently by your health care provider. Contact a health care provider if: You develop any of the following symptoms:  Headaches that keep coming back (chronic headaches).  Nausea.  Vision problems.  Increased sensitivity to noise or light.  Depression or mood swings.  Anxiety or irritability.  Memory problems.  Difficulty concentrating or paying attention.  Sleep problems.  Feeling tired all of the time. Recovery from hemorrhagic stroke varies widely.  Talk with your health care provider about what to expect during your recovery. Get help right away if:  You develop symptoms of a hemorrhagic stroke.  You have a partial or total loss of consciousness.  You are taking blood thinners and you fall or you experience minor injury (trauma) to the head.  You have a bleeding disorder and you fall or you experience minor trauma to the head. These symptoms may represent a serious problem that is an emergency. Do not wait to see if the symptoms will go away. Get medical help right away. Call your local emergency services (911 in the [place] Do not drive yourself to the hospital.  This information is not intended to replace advice given to you by your health care provider. Make sure you discuss any questions you have with your health care provider. Document Released: 12/17/2009 Document Revised: 12/05/2015 Document Reviewed: 06/23/2015 Elsevier Interactive Patient Education  2017 [employer]

## 2016-08-22 NOTE — Progress Notes (Signed)
Subjective: Patient remains stable.  No new neurological complaints.  Ambulating in room.  Objective: Current vital signs: BP 113/69 (BP Location: Left Arm)   Pulse 78   Temp 98 F (36.7 C) (Oral)   Resp 20   Ht 4\' 9"  (1.448 m)   Wt 47.2 kg (104 lb)   SpO2 100%   BMI 22.51 kg/m  Vital signs in last 24 hours: Temp:  [98 F (36.7 C)-98.9 F (37.2 C)] 98 F (36.7 C) (02/10 0800) Pulse Rate:  [60-78] 78 (02/10 0800) Resp:  [17-20] 20 (02/10 0800) BP: (97-157)/(44-69) 113/69 (02/10 0800) SpO2:  [99 %-100 %] 100 % (02/10 0800)  Intake/Output from previous day: 02/09 0701 - 02/10 0700 In: 480 [P.O.:480] Out: 450 [Urine:450] Intake/Output this shift: No intake/output data recorded. Nutritional status: Diet heart healthy/carb modified Room service appropriate? Yes; Fluid consistency: Thin Diet - low sodium heart healthy  Neurologic Exam: Mental Status: Alert, oriented, thought content appropriate.  Speech fluent without evidence of aphasia.  Able to follow 3 step commands without difficulty. Cranial Nerves: II: Discs flat bilaterally; RHH, round, reactive to light and accommodation III,IV, VI: ptosis not present, extra-ocular motions intact bilaterally V,VII: smile symmetric, facial light touch sensation normal bilaterally VIII: hearing normal bilaterally IX,X: gag reflex present XI: bilateral shoulder shrug XII: midline tongue extension Motor: Right :  Upper extremity   5/5                                      Left:     Upper extremity   5/5             Lower extremity   5/5                                                  Lower extremity   5/5 Tone and bulk:normal tone throughout; no atrophy noted Sensory: Pinprick and light touch intact throughout, bilaterally Gait: ambulating with walker and short distances in room without walker.   Lab Results: Basic Metabolic Panel:  Recent Labs Lab 08/20/16 1231 08/21/16 0619 08/22/16 0458  NA 130* 128* 127*  K 4.2 4.8 4.8   CL 90* 91* 89*  CO2 33* 32 32  GLUCOSE 102* 105* 115*  BUN 20 22* 22*  CREATININE 0.69 0.69 0.86  CALCIUM 9.0 9.3 9.1    Liver Function Tests:  Recent Labs Lab 08/20/16 1231  AST 16  ALT 12*  ALKPHOS 92  BILITOT 0.6  PROT 7.0  ALBUMIN 3.9   No results for input(s): LIPASE, AMYLASE in the last 168 hours. No results for input(s): AMMONIA in the last 168 hours.  CBC:  Recent Labs Lab 08/20/16 1231 08/21/16 0619  WBC 11.2* 10.9  NEUTROABS 9.5*  --   HGB 10.1* 10.0*  HCT 29.1* 29.5*  MCV 84.9 86.0  PLT 297 283    Cardiac Enzymes:  Recent Labs Lab 08/20/16 1231  TROPONINI <0.03    Lipid Panel:  Recent Labs Lab 08/20/16 1231  CHOL 142  TRIG 50  HDL 67  CHOLHDL 2.1  VLDL 10  LDLCALC 65    CBG:  Recent Labs Lab 08/20/16 1719 08/21/16 1124  GLUCAP 112* 96    Microbiology: Results for orders placed or performed during the hospital  encounter of 08/20/16  MRSA PCR Screening     Status: Abnormal   Collection Time: 08/22/16  4:29 AM  Result Value Ref Range Status   MRSA by PCR POSITIVE (A) NEGATIVE Final    Comment:        The GeneXpert MRSA Assay (FDA approved for NASAL specimens only), is one component of a comprehensive MRSA colonization surveillance program. It is not intended to diagnose MRSA infection nor to guide or monitor treatment for MRSA infections. RESULT CALLED TO, READ BACK BY AND VERIFIED WITH: CHRISTINA [facility] ON 08/22/16 AT 0538 BY [facility]     Coagulation Studies:  Recent Labs  08/20/16 1231  LABPROT 13.0  INR 0.98    Imaging: Ct Head Wo Contrast  Result Date: 08/22/2016 CLINICAL DATA:  Continued surveillance acute cerebral infarction. EXAM: CT HEAD WITHOUT CONTRAST TECHNIQUE: Contiguous axial images were obtained from the base of the skull through the vertex without intravenous contrast. COMPARISON:  CT head 08/20/2016.  MR head 08/21/2016. FINDINGS: Brain: Again noted is an acute/subacute LEFT occipital infarct.  Hemorrhagic conversion, identified on yesterday's MR, is now apparent on CT, but not clearly worse. The findings do represent interval change from prior CT from 08/20/2016 where there was no visible hemorrhage. No new infarcts, hydrocephalus, or extra-axial fluid. Moderate atrophy.  Chronic microvascular ischemic change. Vascular: Vascular calcification. No signs of emergent large vessel occlusion. Skull: Normal. Negative for fracture or focal lesion. Sinuses/Orbits: No acute finding. Other: None. IMPRESSION: Acute/subacute LEFT occipital infarct with hemorrhagic conversion, similar to the appearance on yesterday's MR, when technique differences are considered. No apparent extension of the infarction or significant mass effect. Electronically Signed   By: Elsie Stain M.D.   On: 08/22/2016 08:52   Ct Head Wo Contrast  Result Date: 08/20/2016 CLINICAL DATA:  Visual loss.  Migraines. EXAM: CT HEAD WITHOUT CONTRAST TECHNIQUE: Contiguous axial images were obtained from the base of the skull through the vertex without intravenous contrast. COMPARISON:  06/26/2015 FINDINGS: Brain: There is low-density noted in the left occipital lobe compatible with acute to subacute infarction. No hemorrhage. No additional area of infarction. No hydrocephalus. Vascular: No hyperdense vessel or unexpected calcification. Skull: No acute calvarial abnormality. Sinuses/Orbits: Visualized paranasal sinuses and mastoids clear. Orbital soft tissues unremarkable. Other: None IMPRESSION: Acute to subacute left occipital infarct. Electronically Signed   By: Charlett Nose M.D.   On: 08/20/2016 13:06   Mr Laqueta Jean ZO Contrast  Result Date: 08/21/2016 CLINICAL DATA:  Cerebral infarction. EXAM: MRI HEAD WITHOUT AND WITH CONTRAST TECHNIQUE: Multiplanar, multiecho pulse sequences of the brain and surrounding structures were obtained without and with intravenous contrast. CONTRAST:  9mL MULTIHANCE GADOBENATE DIMEGLUMINE 529 MG/ML IV SOLN COMPARISON:   CT head without contrast 08/20/2016. MRI brain 09/26/2015 FINDINGS: Brain: The diffusion-weighted images demonstrate restricted diffusion within the left occipital lobe. Susceptibility suggest blood products within the cortical regions. There is also mild enhancement. No enhancing mass lesion is present. Mild atrophy is within normal limits for age. Moderate diffuse white matter changes are seen bilaterally. No other focal cortical infarct is present. Vascular: Flow is present in the major intracranial arteries. Skull and upper cervical spine: The skullbase is within normal limits. Midline sagittal structures are unremarkable. Grade 1 anterolisthesis at C3-4 measures 2 mm. Marrow signal is normal. Sinuses/Orbits: The paranasal sinuses and mastoid air cells are clear. Bilateral lens replacements are present. The globes and orbits are otherwise within normal limits. IMPRESSION: 1. Acute/subacute left occipital lobe infarct with hemorrhagic conversion. 2.  Stable atrophy and moderate diffuse white matter disease otherwise. Electronically Signed   By: Marin Robertshristopher  Mattern M.D.   On: 08/21/2016 10:52   [place] Carotid Bilateral  Result Date: 08/21/2016 CLINICAL DATA:  Stroke; right side visual disturbance. Hypertension, hyperlipidemia. EXAM: BILATERAL CAROTID DUPLEX ULTRASOUND TECHNIQUE: Wallace CullensGray scale imaging, color Doppler and duplex ultrasound was performed of bilateral carotid and vertebral arteries in the neck. COMPARISON:  06/26/2016 TECHNIQUE: Quantification of carotid stenosis is based on velocity parameters that correlate the residual internal carotid diameter with NASCET-based stenosis levels, using the diameter of the distal internal carotid lumen as the denominator for stenosis measurement. The following velocity measurements were obtained: PEAK SYSTOLIC/END DIASTOLIC RIGHT ICA:                     85/15cm/sec CCA:                     67/10cm/sec SYSTOLIC ICA/CCA RATIO:  1.3 DIASTOLIC ICA/CCA RATIO: 1.5 ECA:                      64cm/sec LEFT ICA:                     80/18cm/sec CCA:                     81/11cm/sec SYSTOLIC ICA/CCA RATIO:  1.0 DIASTOLIC ICA/CCA RATIO: 1.6 ECA:                     72cm/sec FINDINGS: RIGHT CAROTID ARTERY: Partially calcified plaque in the carotid bulb and proximal ICA. No high-grade stenosis. Normal waveforms and color Doppler signal. RIGHT VERTEBRAL ARTERY:  Normal flow direction and waveform. LEFT CAROTID ARTERY: Partially calcified plaque in the bulb and proximal ICA. No high-grade stenosis. Normal waveforms and color Doppler signal. LEFT VERTEBRAL ARTERY: Normal flow direction and waveform. IMPRESSION: 1. Bilateral carotid bifurcation and proximal ICA plaque resulting in less than 50% diameter stenosis. The exam does not exclude plaque ulceration or embolization. Continued surveillance recommended. 2.  Antegrade bilateral vertebral arterial flow. Electronically Signed   By: Corlis Leak  Hassell M.D.   On: 08/21/2016 11:09    Medications:  I have reviewed the patient's current medications. Scheduled: . atorvastatin  40 mg Oral q1800  . budesonide  2 mL Inhalation BID  . Chlorhexidine Gluconate Cloth  6 each Topical Q0600  . cholecalciferol  1,000 Units Oral Daily  . cloNIDine  0.1 mg Oral TID  . diltiazem  120 mg Oral Daily  . docusate sodium  100 mg Oral BID  . famotidine  20 mg Oral Daily  . feeding supplement (ENSURE ENLIVE)  237 mL Oral Daily  . ferrous sulfate  325 mg Oral Daily  . furosemide  20 mg Oral Daily  . [START ON 08/23/2016] levothyroxine  125 mcg Oral QAC breakfast  . LORazepam  0.5 mg Oral TID  . losartan  50 mg Oral Daily  . mouth rinse  15 mL Mouth Rinse BID  . multivitamin with minerals  1 tablet Oral Daily  . mupirocin ointment  1 application Nasal BID  . Oxybutynin Chloride  1 packet Transdermal Daily  . polyethylene glycol  17 g Oral Daily  . sodium bicarbonate  325 mg Oral QID  . sodium chloride flush  3 mL Intravenous Q12H  . spironolactone  25 mg Oral  Daily  . tiotropium  18 mcg Inhalation Daily  . vitamin B-12  1,000 mcg Oral Daily    Assessment/Plan: Patient stable.  Head CT repeated and reviewed.  CT shows no progression of hemorrhage.  Echocardiogram shows no cardiac source of emboli.  EF 55-60%.    Recommendations: 1.  Patient to remain off all antiplatelet and anticoagulation 2.  Follow up with neurology on an outpatient basis in 2 weeks.   LOS: 2 days   Thana Farr, MD Neurology [phone] 08/22/2016  11:04 AM

## 2016-08-22 NOTE — Care Management Note (Signed)
Case Management Note  Patient Details  Name: Alyssa Landry MRN: [id number] Date of Birth: 04-26-1919  Subjective/Objective:    A referral for HH-RN, Aide, and SW [facility] Alyssa Landry is a previous client of [facility] and would like to use them again as his home health services provider.                 Action/Plan:   Expected Discharge Date:  08/22/16               Expected Discharge Plan:     In-House Referral:     Discharge planning Services     Post Acute Care Choice:    Choice offered to:     DME Arranged:    DME Agency:     HH Arranged:    HH Agency:     Status of Service:     If discussed at [employer] Meetings, dates discussed:    Additional Comments:  Thelma Viana A, RN 08/22/2016, 12:50 PM

## 2016-08-22 NOTE — Progress Notes (Signed)
[facility] - [place] [facility]   PATIENT NAME: Alyssa Landry    MR#:  [id number]  DATE OF BIRTH:  [birth year]  SUBJECTIVE:  CHIEF COMPLAINT:   Chief Complaint  Patient presents with  . Visual Field Change   - Feels much better this morning. Still has right-sided visual field deficits -CT of the head is pending a follow-up on left occipital hemorrhage  REVIEW OF SYSTEMS:  Review of Systems  Constitutional: Negative for chills, fever and malaise/fatigue.  HENT: Negative for ear discharge, ear pain, hearing loss and nosebleeds.   Eyes: Positive for blurred vision.  Respiratory: Negative for cough, shortness of breath and wheezing.   Cardiovascular: Negative for chest pain, palpitations and leg swelling.  Gastrointestinal: Negative for abdominal pain, constipation, diarrhea, nausea and vomiting.  Genitourinary: Negative for dysuria and urgency.  Musculoskeletal: Negative for myalgias and neck pain.  Neurological: Negative for dizziness, sensory change, speech change, focal weakness, seizures and headaches.       Right homonymous hemoanopsia  Psychiatric/Behavioral: Negative for depression.    DRUG ALLERGIES:   Allergies  Allergen Reactions  . Aspirin Other (See Comments)    Upset stomach with high doses  . Codeine Other (See Comments)    Reaction:  Unknown   . Ditropan [Oxybutynin] Other (See Comments)    Reaction:  Unknown   . Hctz [Hydrochlorothiazide] Other (See Comments)    Reaction:  Causes pts sodium/potassium levels to drop significantly   . Metronidazole Other (See Comments)    Reaction:  Unknown   . Propranolol Other (See Comments)    Reaction:  Unknown   . Tavist [Clemastine] Other (See Comments)    Reaction:  Unknown   . Tramadol Other (See Comments)    Reaction:  Unknown   . Vicodin [Hydrocodone-Acetaminophen] Other (See Comments)    Reaction:  Unknown     VITALS:  Blood pressure 113/69, pulse 78, temperature 98 F (36.7 C),  temperature source Oral, resp. rate 20, height 4\' 9"  (1.448 m), weight 47.2 kg (104 lb), SpO2 100 %.  PHYSICAL EXAMINATION:  Physical Exam  GENERAL:  83 y.o.-year-old elderly patient lying in the bed with no acute distress.  EYES: Pupils equal, round, reactive to light and accommodation. No scleral icterus. Extraocular muscles intact.  HEENT: Head atraumatic, normocephalic. Oropharynx and nasopharynx clear.  NECK:  Supple, no jugular venous distention. No thyroid enlargement, no tenderness.  LUNGS: Normal breath sounds bilaterally, no wheezing, rales,rhonchi or crepitation. No use of accessory muscles of respiration. Decreased bibasilar breath sounds CARDIOVASCULAR: S1, S2 normal. No  rubs, or gallops. 3/6 loud systolic murmur heard ABDOMEN: Soft, nontender, nondistended. Bowel sounds present. No organomegaly or mass.  EXTREMITIES: No pedal edema, cyanosis, or clubbing.  NEUROLOGIC: Cranial nerves II through XII are intact except right homonymous hemianopsia. Muscle strength 5/5 in all extremities. Sensation intact. Gait not checked.  PSYCHIATRIC: The patient is alert and oriented x 3.  SKIN: No obvious rash, lesion, or ulcer.    LABORATORY PANEL:   CBC  Recent Labs Lab 08/21/16 0619  WBC 10.9  HGB 10.0*  HCT 29.5*  PLT 283   ------------------------------------------------------------------------------------------------------------------  Chemistries   Recent Labs Lab 08/20/16 1231  08/22/16 0458  NA 130*  < > 127*  K 4.2  < > 4.8  CL 90*  < > 89*  CO2 33*  < > 32  GLUCOSE 102*  < > 115*  BUN 20  < > 22*  CREATININE 0.69  < >  0.86  CALCIUM 9.0  < > 9.1  AST 16  --   --   ALT 12*  --   --   ALKPHOS 92  --   --   BILITOT 0.6  --   --   < > = values in this interval not displayed. ------------------------------------------------------------------------------------------------------------------  Cardiac Enzymes  Recent Labs Lab 08/20/16 1231  TROPONINI <0.03    ------------------------------------------------------------------------------------------------------------------  RADIOLOGY:  Ct Head Wo Contrast  Result Date: 08/22/2016 CLINICAL DATA:  Continued surveillance acute cerebral infarction. EXAM: CT HEAD WITHOUT CONTRAST TECHNIQUE: Contiguous axial images were obtained from the base of the skull through the vertex without intravenous contrast. COMPARISON:  CT head 08/20/2016.  MR head 08/21/2016. FINDINGS: Brain: Again noted is an acute/subacute LEFT occipital infarct. Hemorrhagic conversion, identified on yesterday's MR, is now apparent on CT, but not clearly worse. The findings do represent interval change from prior CT from 08/20/2016 where there was no visible hemorrhage. No new infarcts, hydrocephalus, or extra-axial fluid. Moderate atrophy.  Chronic microvascular ischemic change. Vascular: Vascular calcification. No signs of emergent large vessel occlusion. Skull: Normal. Negative for fracture or focal lesion. Sinuses/Orbits: No acute finding. Other: None. IMPRESSION: Acute/subacute LEFT occipital infarct with hemorrhagic conversion, similar to the appearance on yesterday's MR, when technique differences are considered. No apparent extension of the infarction or significant mass effect. Electronically Signed   By: Elsie Stain M.D.   On: 08/22/2016 08:52   Ct Head Wo Contrast  Result Date: 08/20/2016 CLINICAL DATA:  Visual loss.  Migraines. EXAM: CT HEAD WITHOUT CONTRAST TECHNIQUE: Contiguous axial images were obtained from the base of the skull through the vertex without intravenous contrast. COMPARISON:  06/26/2015 FINDINGS: Brain: There is low-density noted in the left occipital lobe compatible with acute to subacute infarction. No hemorrhage. No additional area of infarction. No hydrocephalus. Vascular: No hyperdense vessel or unexpected calcification. Skull: No acute calvarial abnormality. Sinuses/Orbits: Visualized paranasal sinuses and  mastoids clear. Orbital soft tissues unremarkable. Other: None IMPRESSION: Acute to subacute left occipital infarct. Electronically Signed   By: Charlett Nose M.D.   On: 08/20/2016 13:06   Mr Laqueta Jean BJ Contrast  Result Date: 08/21/2016 CLINICAL DATA:  Cerebral infarction. EXAM: MRI HEAD WITHOUT AND WITH CONTRAST TECHNIQUE: Multiplanar, multiecho pulse sequences of the brain and surrounding structures were obtained without and with intravenous contrast. CONTRAST:  9mL MULTIHANCE GADOBENATE DIMEGLUMINE 529 MG/ML IV SOLN COMPARISON:  CT head without contrast 08/20/2016. MRI brain 09/26/2015 FINDINGS: Brain: The diffusion-weighted images demonstrate restricted diffusion within the left occipital lobe. Susceptibility suggest blood products within the cortical regions. There is also mild enhancement. No enhancing mass lesion is present. Mild atrophy is within normal limits for age. Moderate diffuse white matter changes are seen bilaterally. No other focal cortical infarct is present. Vascular: Flow is present in the major intracranial arteries. Skull and upper cervical spine: The skullbase is within normal limits. Midline sagittal structures are unremarkable. Grade 1 anterolisthesis at C3-4 measures 2 mm. Marrow signal is normal. Sinuses/Orbits: The paranasal sinuses and mastoid air cells are clear. Bilateral lens replacements are present. The globes and orbits are otherwise within normal limits. IMPRESSION: 1. Acute/subacute left occipital lobe infarct with hemorrhagic conversion. 2. Stable atrophy and moderate diffuse white matter disease otherwise. Electronically Signed   By: Marin Roberts M.D.   On: 08/21/2016 10:52   US Carotid Bilateral  Result Date: 08/21/2016 CLINICAL DATA:  Stroke; right side visual disturbance. Hypertension, hyperlipidemia. EXAM: BILATERAL CAROTID DUPLEX ULTRASOUND TECHNIQUE: Wallace Cullens scale  imaging, color Doppler and duplex ultrasound was performed of bilateral carotid and vertebral  arteries in the neck. COMPARISON:  06/26/2016 TECHNIQUE: Quantification of carotid stenosis is based on velocity parameters that correlate the residual internal carotid diameter with NASCET-based stenosis levels, using the diameter of the distal internal carotid lumen as the denominator for stenosis measurement. The following velocity measurements were obtained: PEAK SYSTOLIC/END DIASTOLIC RIGHT ICA:                     85/15cm/sec CCA:                     67/10cm/sec SYSTOLIC ICA/CCA RATIO:  1.3 DIASTOLIC ICA/CCA RATIO: 1.5 ECA:                     64cm/sec LEFT ICA:                     80/18cm/sec CCA:                     81/11cm/sec SYSTOLIC ICA/CCA RATIO:  1.0 DIASTOLIC ICA/CCA RATIO: 1.6 ECA:                     72cm/sec FINDINGS: RIGHT CAROTID ARTERY: Partially calcified plaque in the carotid bulb and proximal ICA. No high-grade stenosis. Normal waveforms and color Doppler signal. RIGHT VERTEBRAL ARTERY:  Normal flow direction and waveform. LEFT CAROTID ARTERY: Partially calcified plaque in the bulb and proximal ICA. No high-grade stenosis. Normal waveforms and color Doppler signal. LEFT VERTEBRAL ARTERY: Normal flow direction and waveform. IMPRESSION: 1. Bilateral carotid bifurcation and proximal ICA plaque resulting in less than 50% diameter stenosis. The exam does not exclude plaque ulceration or embolization. Continued surveillance recommended. 2.  Antegrade bilateral vertebral arterial flow. Electronically Signed   By: Corlis Leak  Hassell M.D.   On: 08/21/2016 11:09    EKG:   Orders placed or performed during the hospital encounter of 05/01/16  . ED EKG  . ED EKG    ASSESSMENT AND PLAN:   83 y/o [place] female with past medical history significant for atrial fibrillation not on anticoagulation, pulmonary fibrosis and chronically 2 L oxygen, osteoporosis, migraine headaches, hiatal hernia, reflux disease was in stool hospital secondary to right-sided homonymous hemianopsia.  #1 acute occipital CVA-MRI  of the brain showing left occipital infarct with hemorrhagic conversion. -Patient still has persistent visual field defect on the right side. -Carotid Dopplers with no hemodynamically significant stenosis. -Due to the hemorrhagic conversion, DC aspirin and Plavix according to neurology. -started statin. Echocardiogram is pending. -Physical therapy and occupational therapy consults are pending at this time. -Repeat CT head Today to monitor hemorrhage -Will be discharged without aspirin and Plavix, we'll have to start aspirin at some point later due to her A. fib  #2 Chronic atrial fibrillation-not a candidate for anticoagulation now with hemorrhagic CVA.  - hold Plavix and aspirin for now -Rate controlled with oral medications. On oral Cardizem  #3 hypertension-continue oral Cardizem, Aldactone, clonidine and losartan  #4 pulmonary fibrosis-chronically O2 dependent. Continue at this time. Appears stable otherwise. Also on inhalers/nebulizers which will be continued.  #5 chronic hyponatremia-follows with nephrology. on fluid restriction.  on sodium bicarbonate tablets and Lasix daily. -Repeat checked again later today. If continues to drop, we'll consult nephrology  #6 DVT prophylaxis-Ted's and SCDs at this time. Heparin products discontinued due to hemorrhagic stroke   Anticipate discharge her sodium is improving and  CT of the head is stable.   All the records are reviewed and case discussed with Care Management/Social Workerr. Management plans discussed with the patient, family and they are in agreement.  CODE STATUS: Full Code  TOTAL TIME TAKING CARE OF THIS PATIENT: 38 minutes.   POSSIBLE D/C IN 1-2 DAYS, DEPENDING ON CLINICAL CONDITION.   Donice Alperin M.D on 08/22/2016 at 9:05 AM  Between 7am to 6pm - Pager - [phone]  After 6pm go to [email or web address] - Social research officer, government  [facility]  Office  [phone]  CC: Primary care physician; Rafael Bihari, MD

## 2016-08-22 NOTE — Progress Notes (Signed)
Repeat CT done with report reviewed. Neuro in to see pt. Pt reports continued "blurry" vision in right eye with deficit in right peripheral field- blind spot. Denies co's/concerns. [facility]SS[facility] D[facility] at bedside and ready to take pt Home. HH referral being placed. DNR form returned to dgt. NIH unchanged at 1. Oral and written AVS completed with pt and dgt with questions answered to her satisfaction. Family has pt 02 tank for transport home.

## 2016-08-22 NOTE — Discharge Summary (Signed)
[facility] [place] [facility]   PATIENT NAME: Alyssa Landry    MR#:  [id number]  DATE OF BIRTH:  Jul 23, 1918  DATE OF ADMISSION:  08/20/2016   ADMITTING PHYSICIAN: Altamese Dilling, MD  DATE OF DISCHARGE: 08/22/16  PRIMARY CARE PHYSICIAN: Rafael Bihari, MD   ADMISSION DIAGNOSIS:   Cerebral infarction (HCC) [I63.9] CVA (cerebral infarction) [I63.9] Acute ischemic stroke (HCC) [I63.9]  DISCHARGE DIAGNOSIS:   Principal Problem:   Stroke [facility])   SECONDARY DIAGNOSIS:   Past Medical History:  Diagnosis Date  . A-fib (HCC)   . Acid reflux   . Anemia   . Anxiety   . Arthritis   . Asthma   . Chronic hyponatremia   . COPD (chronic obstructive pulmonary disease) (HCC)   . GERD (gastroesophageal reflux disease)   . Hiatal hernia   . Hiatal hernia   . Hyperlipidemia   . Hypertension   . Hypothyroidism   . Incontinence   . Migraine   . Osteoporosis, post-menopausal   . Persistent cough   . Pulmonary fibrosis (HCC)   . SIADH (syndrome of inappropriate ADH production) (HCC)   . TIA (transient ischemic attack)     HOSPITAL COURSE:   83 y/o [place] female with past medical history significant for atrial fibrillation not on anticoagulation, pulmonary fibrosis and chronically 2 L oxygen, osteoporosis, migraine headaches, hiatal hernia, reflux disease was in stool hospital secondary to right-sided homonymous hemianopsia.  #1 acute occipital CVA-MRI of the brain showing left occipital infarct with hemorrhagic conversion. -Patient still has persistent visual field defect on the right side. -Carotid Dopplers with no hemodynamically significant stenosis. -Due to the hemorrhagic conversion, DC aspirin and Plavix according to neurology. -started statin. Echocardiogram is normal without cardiac source of emboli. -Physical therapy and occupational therapy consults are pending at this time. -Repeat CT head with stable left occipital  hemorrhage -Will be discharged without aspirin and Plavix, we'll have to start aspirin at some point later due to her A. fib  #2 Chronic atrial fibrillation-not a candidate for anticoagulation now with hemorrhagic CVA.  - hold Plavix and aspirin for now -Rate controlled with oral medications. On oral Cardizem  #3 hypertension-continue oral Cardizem, Aldactone, clonidine and losartan  #4 pulmonary fibrosis-chronically O2 dependent. Continue at this time. Appears stable otherwise. Also on inhalers/nebulizers which will be continued.  #5 chronic hyponatremia-follows with nephrology. on fluid restriction.  on sodium bicarbonate tablets and Lasix daily. -follow up sodium level in 2 days, patient is not symptomatic    DISCHARGE CONDITIONS:   Guarded  CONSULTS OBTAINED:   Treatment Team:  Kym Groom, MD Thana Farr, MD  DRUG ALLERGIES:   Allergies  Allergen Reactions  . Aspirin Other (See Comments)    Upset stomach with high doses  . Codeine Other (See Comments)    Reaction:  Unknown   . Ditropan [Oxybutynin] Other (See Comments)    Reaction:  Unknown   . Hctz [Hydrochlorothiazide] Other (See Comments)    Reaction:  Causes pts sodium/potassium levels to drop significantly   . Metronidazole Other (See Comments)    Reaction:  Unknown   . Propranolol Other (See Comments)    Reaction:  Unknown   . Tavist [Clemastine] Other (See Comments)    Reaction:  Unknown   . Tramadol Other (See Comments)    Reaction:  Unknown   . Vicodin [Hydrocodone-Acetaminophen] Other (See Comments)    Reaction:  Unknown    DISCHARGE MEDICATIONS:   Allergies as  of 08/22/2016      Reactions   Aspirin Other (See Comments)   Upset stomach with high doses   Codeine Other (See Comments)   Reaction:  Unknown    Ditropan [oxybutynin] Other (See Comments)   Reaction:  Unknown    Hctz [hydrochlorothiazide] Other (See Comments)   Reaction:  Causes pts sodium/potassium levels to drop  significantly    Metronidazole Other (See Comments)   Reaction:  Unknown    Propranolol Other (See Comments)   Reaction:  Unknown    Tavist [clemastine] Other (See Comments)   Reaction:  Unknown    Tramadol Other (See Comments)   Reaction:  Unknown    Vicodin [hydrocodone-acetaminophen] Other (See Comments)   Reaction:  Unknown       Medication List    STOP taking these medications   aspirin EC 81 MG tablet   clopidogrel 75 MG tablet Commonly known as:  PLAVIX     TAKE these medications   acetaminophen 650 MG CR tablet Commonly known as:  TYLENOL Take 650 mg by mouth every 8 (eight) hours as needed for pain.   albuterol 108 (90 Base) MCG/ACT inhaler Commonly known as:  PROVENTIL HFA;VENTOLIN HFA Inhale 1-2 puffs into the lungs every 4 (four) hours as needed for wheezing or shortness of breath.   atorvastatin 40 MG tablet Commonly known as:  LIPITOR Take 1 tablet (40 mg total) by mouth daily at 6 PM.   cholecalciferol 1000 units tablet Commonly known as:  VITAMIN D Take 1,000 Units by mouth daily.   cloNIDine 0.1 MG tablet Commonly known as:  CATAPRES Take 0.1 mg by mouth 3 (three) times daily.   diltiazem 120 MG 24 hr capsule Commonly known as:  CARDIZEM CD Take 1 capsule (120 mg total) by mouth daily.   docusate sodium 100 MG capsule Commonly known as:  COLACE Take 100 mg by mouth 2 (two) times daily.   feeding supplement (ENSURE ENLIVE) Liqd Take 237 mLs by mouth daily.   ferrous sulfate 325 (65 FE) MG tablet Take 325 mg by mouth daily.   fluticasone 110 MCG/ACT inhaler Commonly known as:  FLOVENT HFA Inhale 2 puffs into the lungs 2 (two) times daily. 0930,1700   fluticasone 50 MCG/ACT nasal spray Commonly known as:  FLONASE Place 2 sprays into both nostrils every morning.   furosemide 20 MG tablet Commonly known as:  LASIX Take 1 tablet (20 mg total) by mouth daily. What changed:  how much to take   guaiFENesin-dextromethorphan 100-10 MG/5ML  syrup Commonly known as:  ROBITUSSIN DM Take 10 mLs by mouth every 6 (six) hours as needed for cough.   levothyroxine 125 MCG tablet Commonly known as:  SYNTHROID, LEVOTHROID Take 125 mcg by mouth daily before breakfast.   LORazepam 0.5 MG tablet Commonly known as:  ATIVAN Take 1 tablet (0.5 mg total) by mouth 3 (three) times daily. What changed:  additional instructions   losartan 50 MG tablet Commonly known as:  COZAAR Take 50 mg by mouth daily.   multivitamin-lutein Caps capsule Take 2 capsules by mouth daily.   Oxybutynin Chloride 10 % Gel Commonly known as:  GELNIQUE Place 1 application onto the skin daily.   polyethylene glycol packet Commonly known as:  MIRALAX / GLYCOLAX Take 17 g by mouth daily. *Mix in 4-8 ounces of fluid prior to taking*   ranitidine 150 MG tablet Commonly known as:  ZANTAC Take 150 mg by mouth 2 (two) times daily before a meal.  sodium bicarbonate 650 MG tablet Take 325 mg by mouth 4 (four) times daily.   spironolactone 25 MG tablet Commonly known as:  ALDACTONE Take 25 mg by mouth daily.   tiotropium 18 MCG inhalation capsule Commonly known as:  SPIRIVA Place 18 mcg into inhaler and inhale daily.   vitamin B-12 1000 MCG tablet Commonly known as:  CYANOCOBALAMIN Take 1,000 mcg by mouth daily.        DISCHARGE INSTRUCTIONS:   1. PCP f/u in 1-2 weeks 2. Nephrology f/u in 1 week- for repeat sodium level  DIET:   Cardiac diet  ACTIVITY:   Activity as tolerated  OXYGEN:   Home Oxygen: Yes.    Oxygen Delivery: 2 liters/min via Patient connected to nasal cannula oxygen  DISCHARGE LOCATION:   home   If you experience worsening of your admission symptoms, develop shortness of breath, life threatening emergency, suicidal or homicidal thoughts you must seek medical attention immediately by calling 911 or calling your MD immediately  if symptoms less severe.  You Must read complete instructions/literature along with all the  possible adverse reactions/side effects for all the Medicines you take and that have been prescribed to you. Take any new Medicines after you have completely understood and accpet all the possible adverse reactions/side effects.   Please note  You were cared for by a hospitalist during your hospital stay. If you have any questions about your discharge medications or the care you received while you were in the hospital after you are discharged, you can call the unit and asked to speak with the hospitalist on call if the hospitalist that took care of you is not available. Once you are discharged, your primary care physician will handle any further medical issues. Please note that NO REFILLS for any discharge medications will be authorized once you are discharged, as it is imperative that you return to your primary care physician (or establish a relationship with a primary care physician if you do not have one) for your aftercare needs so that they can reassess your need for medications and monitor your lab values.    On the day of Discharge:  VITAL SIGNS:   Blood pressure 113/69, pulse 78, temperature 98 F (36.7 C), temperature source Oral, resp. rate 20, height 4\' 9"  (1.448 m), weight 47.2 kg (104 lb), SpO2 100 %.  PHYSICAL EXAMINATION:    GENERAL:  83 y.o.-year-old elderly patient lying in the bed with no acute distress.  EYES: Pupils equal, round, reactive to light and accommodation. No scleral icterus. Extraocular muscles intact.  HEENT: Head atraumatic, normocephalic. Oropharynx and nasopharynx clear.  NECK:  Supple, no jugular venous distention. No thyroid enlargement, no tenderness.  LUNGS: Normal breath sounds bilaterally, no wheezing, rales,rhonchi or crepitation. No use of accessory muscles of respiration. Decreased bibasilar breath sounds CARDIOVASCULAR: S1, S2 normal. No  rubs, or gallops. 3/6 loud systolic murmur heard ABDOMEN: Soft, nontender, nondistended. Bowel sounds present.  No organomegaly or mass.  EXTREMITIES: No pedal edema, cyanosis, or clubbing.  NEUROLOGIC: Cranial nerves II through XII are intact except right homonymous hemianopsia. Muscle strength 5/5 in all extremities. Sensation intact. Gait not checked.  PSYCHIATRIC: The patient is alert and oriented x 3.  SKIN: No obvious rash, lesion, or ulcer. Marland Kitchen.   DATA REVIEW:   CBC  Recent Labs Lab 08/21/16 0619  WBC 10.9  HGB 10.0*  HCT 29.5*  PLT 283    Chemistries   Recent Labs Lab 08/20/16 1231  08/22/16 0458 08/22/16  1015  NA 130*  < > 127* 125*  K 4.2  < > 4.8  --   CL 90*  < > 89*  --   CO2 33*  < > 32  --   GLUCOSE 102*  < > 115*  --   BUN 20  < > 22*  --   CREATININE 0.69  < > 0.86  --   CALCIUM 9.0  < > 9.1  --   AST 16  --   --   --   ALT 12*  --   --   --   ALKPHOS 92  --   --   --   BILITOT 0.6  --   --   --   < > = values in this interval not displayed.   Microbiology Results  Results for orders placed or performed during the hospital encounter of 08/20/16  MRSA PCR Screening     Status: Abnormal   Collection Time: 08/22/16  4:29 AM  Result Value Ref Range Status   MRSA by PCR POSITIVE (A) NEGATIVE Final    Comment:        The GeneXpert MRSA Assay (FDA approved for NASAL specimens only), is one component of a comprehensive MRSA colonization surveillance program. It is not intended to diagnose MRSA infection nor to guide or monitor treatment for MRSA infections. RESULT CALLED TO, READ BACK BY AND VERIFIED WITH: CHRISTINA [facility] ON 08/22/16 AT 0538 BY [facility]     RADIOLOGY:  Ct Head Wo Contrast  Result Date: 08/22/2016 CLINICAL DATA:  Continued surveillance acute cerebral infarction. EXAM: CT HEAD WITHOUT CONTRAST TECHNIQUE: Contiguous axial images were obtained from the base of the skull through the vertex without intravenous contrast. COMPARISON:  CT head 08/20/2016.  MR head 08/21/2016. FINDINGS: Brain: Again noted is an acute/subacute LEFT occipital infarct.  Hemorrhagic conversion, identified on yesterday's MR, is now apparent on CT, but not clearly worse. The findings do represent interval change from prior CT from 08/20/2016 where there was no visible hemorrhage. No new infarcts, hydrocephalus, or extra-axial fluid. Moderate atrophy.  Chronic microvascular ischemic change. Vascular: Vascular calcification. No signs of emergent large vessel occlusion. Skull: Normal. Negative for fracture or focal lesion. Sinuses/Orbits: No acute finding. Other: None. IMPRESSION: Acute/subacute LEFT occipital infarct with hemorrhagic conversion, similar to the appearance on yesterday's MR, when technique differences are considered. No apparent extension of the infarction or significant mass effect. Electronically Signed   By: Elsie Stain M.D.   On: 08/22/2016 08:52     Management plans discussed with the patient, family and they are in agreement.  CODE STATUS:     Code Status Orders        Start     Ordered   08/20/16 1711  Do not attempt resuscitation (DNR)  Continuous    Question Answer Comment  In the event of cardiac or respiratory ARREST Do not call a "code blue"   In the event of cardiac or respiratory ARREST Do not perform Intubation, CPR, defibrillation or ACLS   In the event of cardiac or respiratory ARREST Use medication by any route, position, wound care, and other measures to relive pain and suffering. May use oxygen, suction and manual treatment of airway obstruction as needed for comfort.      08/20/16 1711    Code Status History    Date Active Date Inactive Code Status Order ID Comments User Context   05/01/2016  6:48 PM 05/04/2016  7:55 PM DNR [id number]  Enedina Finner, MD Inpatient   02/18/2016 11:10 PM 02/25/2016  6:31 PM DNR [id number]  Tonye Royalty, DO Inpatient   01/26/2016  9:56 PM 01/30/2016  7:34 PM DNR [id number]  Houston Siren, MD Inpatient   12/11/2015  9:29 PM 12/15/2015  8:13 PM DNR [id number]  Ramonita Lab, MD Inpatient   09/25/2015   4:17 PM 09/26/2015 10:07 PM DNR [id number]  Altamese Dilling, MD ED    Advance Directive Documentation   Flowsheet Row Most Recent Value  Type of Advance Directive  Healthcare Power of Attorney, Out of facility DNR (pink MOST or yellow form)  Pre-existing out of facility DNR order (yellow form or pink MOST form)  Yellow form placed in chart (order not valid for inpatient use)  "MOST" Form in Place?  No data      TOTAL TIME TAKING CARE OF THIS PATIENT: 37 minutes.    Zael Shuman M.D on 08/22/2016 at 12:16 PM  Between 7am to 6pm - Pager - [phone]  After 6pm go to [email or web address] - Social research officer, government  [facility]  Office  [phone]  CC: Primary care physician; Rafael Bihari, MD   Note: This dictation was prepared with Dragon dictation along with smaller phrase technology. Any transcriptional errors that result from this process are unintentional.

## 2016-09-04 ENCOUNTER — Observation Stay
Admission: EM | Admit: 2016-09-04 | Discharge: 2016-09-05 | Disposition: A | Discharge status: Home / Self Care | Payer: Medicare Other | Attending: Internal Medicine | Admitting: Internal Medicine

## 2016-09-04 ENCOUNTER — Emergency Department: Payer: Medicare Other

## 2016-09-04 ENCOUNTER — Encounter: Payer: Self-pay | Admitting: *Deleted

## 2016-09-04 DIAGNOSIS — G3189 Other specified degenerative diseases of nervous system: Secondary | ICD-10-CM | POA: Insufficient documentation

## 2016-09-04 DIAGNOSIS — F419 Anxiety disorder, unspecified: Secondary | ICD-10-CM | POA: Insufficient documentation

## 2016-09-04 DIAGNOSIS — Z66 Do not resuscitate: Secondary | ICD-10-CM | POA: Diagnosis not present

## 2016-09-04 DIAGNOSIS — E785 Hyperlipidemia, unspecified: Secondary | ICD-10-CM | POA: Diagnosis not present

## 2016-09-04 DIAGNOSIS — I482 Chronic atrial fibrillation: Secondary | ICD-10-CM | POA: Diagnosis not present

## 2016-09-04 DIAGNOSIS — I5032 Chronic diastolic (congestive) heart failure: Secondary | ICD-10-CM | POA: Insufficient documentation

## 2016-09-04 DIAGNOSIS — I700 Atherosclerosis of aorta: Secondary | ICD-10-CM | POA: Diagnosis not present

## 2016-09-04 DIAGNOSIS — K219 Gastro-esophageal reflux disease without esophagitis: Secondary | ICD-10-CM | POA: Diagnosis not present

## 2016-09-04 DIAGNOSIS — J9611 Chronic respiratory failure with hypoxia: Secondary | ICD-10-CM | POA: Diagnosis not present

## 2016-09-04 DIAGNOSIS — I110 Hypertensive heart disease with heart failure: Secondary | ICD-10-CM | POA: Diagnosis not present

## 2016-09-04 DIAGNOSIS — E039 Hypothyroidism, unspecified: Secondary | ICD-10-CM | POA: Diagnosis not present

## 2016-09-04 DIAGNOSIS — I4891 Unspecified atrial fibrillation: Secondary | ICD-10-CM

## 2016-09-04 DIAGNOSIS — J8410 Pulmonary fibrosis, unspecified: Secondary | ICD-10-CM | POA: Insufficient documentation

## 2016-09-04 DIAGNOSIS — Z888 Allergy status to other drugs, medicaments and biological substances status: Secondary | ICD-10-CM | POA: Insufficient documentation

## 2016-09-04 DIAGNOSIS — Z7982 Long term (current) use of aspirin: Secondary | ICD-10-CM | POA: Diagnosis not present

## 2016-09-04 DIAGNOSIS — Z886 Allergy status to analgesic agent status: Secondary | ICD-10-CM | POA: Diagnosis not present

## 2016-09-04 DIAGNOSIS — M1990 Unspecified osteoarthritis, unspecified site: Secondary | ICD-10-CM | POA: Insufficient documentation

## 2016-09-04 DIAGNOSIS — Z9981 Dependence on supplemental oxygen: Secondary | ICD-10-CM | POA: Diagnosis not present

## 2016-09-04 DIAGNOSIS — Z885 Allergy status to narcotic agent status: Secondary | ICD-10-CM | POA: Insufficient documentation

## 2016-09-04 DIAGNOSIS — J449 Chronic obstructive pulmonary disease, unspecified: Secondary | ICD-10-CM | POA: Diagnosis not present

## 2016-09-04 DIAGNOSIS — G459 Transient cerebral ischemic attack, unspecified: Principal | ICD-10-CM | POA: Diagnosis present

## 2016-09-04 DIAGNOSIS — M810 Age-related osteoporosis without current pathological fracture: Secondary | ICD-10-CM | POA: Insufficient documentation

## 2016-09-04 DIAGNOSIS — I69398 Other sequelae of cerebral infarction: Secondary | ICD-10-CM | POA: Diagnosis not present

## 2016-09-04 DIAGNOSIS — H538 Other visual disturbances: Secondary | ICD-10-CM | POA: Diagnosis not present

## 2016-09-04 DIAGNOSIS — E871 Hypo-osmolality and hyponatremia: Secondary | ICD-10-CM | POA: Insufficient documentation

## 2016-09-04 LAB — CBC
HCT: 29.5 % — ABNORMAL LOW (ref 35.0–47.0)
HEMOGLOBIN: 10.1 g/dL — AB (ref 12.0–16.0)
MCH: 29.8 pg (ref 26.0–34.0)
MCHC: 34.3 g/dL (ref 32.0–36.0)
MCV: 86.8 fL (ref 80.0–100.0)
Platelets: 281 10*3/uL (ref 150–440)
RBC: 3.39 MIL/uL — ABNORMAL LOW (ref 3.80–5.20)
RDW: 14.2 % (ref 11.5–14.5)
WBC: 5.5 10*3/uL (ref 3.6–11.0)

## 2016-09-04 LAB — URINALYSIS, COMPLETE (UACMP) WITH MICROSCOPIC
BILIRUBIN URINE: NEGATIVE
Bacteria, UA: NONE SEEN
Glucose, UA: NEGATIVE mg/dL
HGB URINE DIPSTICK: NEGATIVE
Ketones, ur: NEGATIVE mg/dL
Leukocytes, UA: NEGATIVE
NITRITE: NEGATIVE
PROTEIN: NEGATIVE mg/dL
Specific Gravity, Urine: 1.009 (ref 1.005–1.030)
Squamous Epithelial / LPF: NONE SEEN
pH: 7 (ref 5.0–8.0)

## 2016-09-04 LAB — HEPATIC FUNCTION PANEL
ALT: 17 U/L (ref 14–54)
AST: 21 U/L (ref 15–41)
Albumin: 4 g/dL (ref 3.5–5.0)
Alkaline Phosphatase: 92 U/L (ref 38–126)
Total Bilirubin: 0.1 mg/dL — ABNORMAL LOW (ref 0.3–1.2)
Total Protein: 7 g/dL (ref 6.5–8.1)

## 2016-09-04 LAB — TROPONIN I

## 2016-09-04 LAB — BASIC METABOLIC PANEL
ANION GAP: 6 (ref 5–15)
BUN: 24 mg/dL — ABNORMAL HIGH (ref 6–20)
CALCIUM: 9.4 mg/dL (ref 8.9–10.3)
CO2: 33 mmol/L — AB (ref 22–32)
Chloride: 94 mmol/L — ABNORMAL LOW (ref 101–111)
Creatinine, Ser: 0.49 mg/dL (ref 0.44–1.00)
Glucose, Bld: 90 mg/dL (ref 65–99)
Potassium: 5 mmol/L (ref 3.5–5.1)
Sodium: 133 mmol/L — ABNORMAL LOW (ref 135–145)

## 2016-09-04 MED ORDER — ENSURE ENLIVE PO LIQD
237.0000 mL | Freq: Every day | ORAL | Status: DC
  Administered 2016-09-04 – 2016-09-05 (×2): 237 mL via ORAL

## 2016-09-04 MED ORDER — ATORVASTATIN CALCIUM 20 MG PO TABS
40.0000 mg | ORAL_TABLET | Freq: Every day | ORAL | Status: DC
  Administered 2016-09-04: 21:00:00 40 mg via ORAL
  Filled 2016-09-04: qty 2

## 2016-09-04 MED ORDER — GUAIFENESIN-DM 100-10 MG/5ML PO SYRP: 10.0000 mL | ORAL_SOLUTION | Freq: Four times a day (QID) | ORAL | Status: DC | PRN

## 2016-09-04 MED ORDER — ASPIRIN EC 81 MG PO TBEC
81.0000 mg | DELAYED_RELEASE_TABLET | Freq: Every day | ORAL | Status: DC
  Administered 2016-09-04 – 2016-09-05 (×2): 81 mg via ORAL
  Filled 2016-09-04 (×2): qty 1

## 2016-09-04 MED ORDER — TIOTROPIUM BROMIDE MONOHYDRATE 18 MCG IN CAPS
18.0000 ug | ORAL_CAPSULE | Freq: Every day | RESPIRATORY_TRACT | Status: DC
  Filled 2016-09-04: qty 5

## 2016-09-04 MED ORDER — FLUTICASONE PROPIONATE 50 MCG/ACT NA SUSP
2.0000 | NASAL | Status: DC
  Administered 2016-09-05: 08:00:00 2 via NASAL
  Filled 2016-09-04: qty 16

## 2016-09-04 MED ORDER — ACETAMINOPHEN 650 MG RE SUPP: 650.0000 mg | RECTAL | Status: DC | PRN

## 2016-09-04 MED ORDER — DOCUSATE SODIUM 100 MG PO CAPS
100.0000 mg | ORAL_CAPSULE | Freq: Two times a day (BID) | ORAL | Status: DC
  Administered 2016-09-04 – 2016-09-05 (×2): 100 mg via ORAL
  Filled 2016-09-04 (×2): qty 1

## 2016-09-04 MED ORDER — CLONIDINE HCL 0.1 MG PO TABS
0.1000 mg | ORAL_TABLET | Freq: Three times a day (TID) | ORAL | Status: DC
  Administered 2016-09-04: 21:00:00 0.1 mg via ORAL
  Filled 2016-09-04: qty 1

## 2016-09-04 MED ORDER — ACETAMINOPHEN 325 MG PO TABS: 650.0000 mg | ORAL_TABLET | ORAL | Status: DC | PRN

## 2016-09-04 MED ORDER — LORAZEPAM 0.5 MG PO TABS
0.5000 mg | ORAL_TABLET | Freq: Three times a day (TID) | ORAL | Status: DC
  Administered 2016-09-04 – 2016-09-05 (×2): 0.5 mg via ORAL
  Filled 2016-09-04 (×2): qty 1

## 2016-09-04 MED ORDER — ALBUTEROL SULFATE (2.5 MG/3ML) 0.083% IN NEBU: 2.5000 mg | INHALATION_SOLUTION | RESPIRATORY_TRACT | Status: DC | PRN

## 2016-09-04 MED ORDER — TIOTROPIUM BROMIDE MONOHYDRATE 18 MCG IN CAPS
18.0000 ug | ORAL_CAPSULE | Freq: Every day | RESPIRATORY_TRACT | Status: DC
  Administered 2016-09-05: 08:00:00 18 ug via RESPIRATORY_TRACT
  Filled 2016-09-04: qty 5

## 2016-09-04 MED ORDER — LOSARTAN POTASSIUM 50 MG PO TABS: 50.0000 mg | ORAL_TABLET | Freq: Every day | ORAL | Status: DC

## 2016-09-04 MED ORDER — DILTIAZEM HCL ER COATED BEADS 120 MG PO CP24
120.0000 mg | ORAL_CAPSULE | Freq: Every day | ORAL | Status: DC
  Administered 2016-09-05: 08:00:00 120 mg via ORAL
  Filled 2016-09-04: qty 1

## 2016-09-04 MED ORDER — BUDESONIDE 0.25 MG/2ML IN SUSP
0.2500 mg | Freq: Two times a day (BID) | RESPIRATORY_TRACT | Status: DC
  Administered 2016-09-04 – 2016-09-05 (×2): 0.25 mg via RESPIRATORY_TRACT
  Filled 2016-09-04 (×2): qty 2

## 2016-09-04 MED ORDER — VITAMIN D 1000 UNITS PO TABS
1000.0000 [IU] | ORAL_TABLET | Freq: Every day | ORAL | Status: DC
  Administered 2016-09-05: 08:00:00 1000 [IU] via ORAL
  Filled 2016-09-04: qty 1

## 2016-09-04 MED ORDER — STROKE: EARLY STAGES OF RECOVERY BOOK
Freq: Once | Status: AC
  Administered 2016-09-04: 20:00:00

## 2016-09-04 MED ORDER — LEVOTHYROXINE SODIUM 125 MCG PO TABS
125.0000 ug | ORAL_TABLET | Freq: Every day | ORAL | Status: DC
  Administered 2016-09-05: 08:00:00 125 ug via ORAL
  Filled 2016-09-04: qty 1

## 2016-09-04 MED ORDER — SODIUM BICARBONATE 650 MG PO TABS
325.0000 mg | ORAL_TABLET | Freq: Four times a day (QID) | ORAL | Status: DC
  Administered 2016-09-04 – 2016-09-05 (×4): 325 mg via ORAL
  Filled 2016-09-04 (×4): qty 1

## 2016-09-04 MED ORDER — POLYETHYLENE GLYCOL 3350 17 G PO PACK: 17.0000 g | PACK | Freq: Every day | ORAL | Status: DC | PRN

## 2016-09-04 MED ORDER — ACETAMINOPHEN 160 MG/5ML PO SOLN
650.0000 mg | ORAL | Status: DC | PRN
  Filled 2016-09-04: qty 20.3

## 2016-09-04 MED ORDER — SODIUM CHLORIDE 0.9 % IV BOLUS (SEPSIS)
250.0000 mL | Freq: Once | INTRAVENOUS | Status: AC
  Administered 2016-09-04: 250 mL via INTRAVENOUS

## 2016-09-04 MED ORDER — FLUTICASONE PROPIONATE HFA 110 MCG/ACT IN AERO: 2.0000 | INHALATION_SPRAY | Freq: Two times a day (BID) | RESPIRATORY_TRACT | Status: DC

## 2016-09-04 MED ORDER — FERROUS SULFATE 325 (65 FE) MG PO TABS
325.0000 mg | ORAL_TABLET | Freq: Every day | ORAL | Status: DC
  Administered 2016-09-05: 08:00:00 325 mg via ORAL
  Filled 2016-09-04: qty 1

## 2016-09-04 MED ORDER — FUROSEMIDE 40 MG PO TABS
40.0000 mg | ORAL_TABLET | Freq: Every day | ORAL | Status: DC
  Administered 2016-09-05: 40 mg via ORAL
  Filled 2016-09-04: qty 1

## 2016-09-04 MED ORDER — ENOXAPARIN SODIUM 30 MG/0.3ML ~~LOC~~ SOLN
30.0000 mg | SUBCUTANEOUS | Status: DC
  Administered 2016-09-04: 21:00:00 30 mg via SUBCUTANEOUS
  Filled 2016-09-04: qty 0.3

## 2016-09-04 MED ORDER — SPIRONOLACTONE 25 MG PO TABS
25.0000 mg | ORAL_TABLET | Freq: Every day | ORAL | Status: DC
  Administered 2016-09-05: 25 mg via ORAL
  Filled 2016-09-04: qty 1

## 2016-09-04 MED ORDER — OCUVITE-LUTEIN PO CAPS
2.0000 | ORAL_CAPSULE | Freq: Every day | ORAL | Status: DC
  Administered 2016-09-05: 09:00:00 2 via ORAL
  Filled 2016-09-04: qty 2

## 2016-09-04 MED ORDER — VITAMIN B-12 1000 MCG PO TABS
1000.0000 ug | ORAL_TABLET | Freq: Every day | ORAL | Status: DC
  Administered 2016-09-05: 1000 ug via ORAL
  Filled 2016-09-04: qty 1

## 2016-09-04 MED ORDER — OXYBUTYNIN CHLORIDE 10 % TD GEL: 1.0000 | Freq: Every day | TRANSDERMAL | Status: DC

## 2016-09-04 NOTE — ED Triage Notes (Addendum)
States she woke up this AM feeling normal, states at 0945 this AM after breakfast she felt weak and dizzy, states she sat down and then she noticed her right leg "felt different", daughter called  Home health and was told to come for evaluation, states she saw her neurologist yesterday for follow up appt from a brain bleed on 2/7, at present pt states feeling tired but denies any pain, headache, dizziness, leg weakness has since resolved

## 2016-09-04 NOTE — H&P (Addendum)
[facility] [place] [facility]   PATIENT NAME: Alyssa Landry    MR#:  [id number]  DATE OF BIRTH:  1919/06/14  DATE OF ADMISSION:  09/04/2016  PRIMARY CARE PHYSICIAN: Rafael Bihari, MD   REQUESTING/REFERRING PHYSICIAN:Dr Jarold Motto  CHIEF COMPLAINT:   Right Leg weakness HISTORY OF PRESENT ILLNESS:  Alyssa Landry  is a 83 y.o. 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 Patient reports that she will cup this morning in her normal state at approximately 945 after eating breakfast she was feeling weak and dizzy and noticed that her right leg was very weak. She actually saw the neurologist yesterday for follow-up from her recent hospitalization with CVA and hemorrhagic conversion. During her last hospitalization she had hemorrhagic conversion and therefore antiplatelet therapy was not initiated. She does have a history of chronic atrial fibrillaiton. She denies headache and she states her symptoms have now resolved. She denies any other focal deficits. Neurology was consulted by ER physician who recommended observation. During last hospitalization patient underwent carotid ultrasound and echocardiogram which were essentially unremarkable.  PAST MEDICAL HISTORY:   Past Medical History:  Diagnosis Date  . A-fib (HCC)   . Acid reflux   . Anemia   . Anxiety   . Arthritis   . Asthma   . Chronic hyponatremia   . COPD (chronic obstructive pulmonary disease) (HCC)   . GERD (gastroesophageal reflux disease)   . Hiatal hernia   . Hiatal hernia   . Hyperlipidemia   . Hypertension   . Hypothyroidism   . Incontinence   . Migraine   . Osteoporosis, post-menopausal   . Persistent cough   . Pulmonary fibrosis (HCC)   . SIADH (syndrome of inappropriate ADH production) (HCC)   . TIA  (transient ischemic attack)   Occipital CVA with hemorrhagic conversion  PAST SURGICAL HISTORY:   Past Surgical History:  Procedure Laterality Date  . ABDOMINAL HYSTERECTOMY  1966    SOCIAL HISTORY:   Social History  Substance Use Topics  . Smoking status: Never Smoker  . Smokeless tobacco: Never Used  . Alcohol use No    FAMILY HISTORY:   Family History  Problem Relation Age of Onset  . Leukemia Mother   . Heart disease Father   . Hypertension      DRUG ALLERGIES:   Allergies  Allergen Reactions  . Aspirin Other (See Comments)    Upset stomach with high doses  . Codeine Other (See Comments)    Reaction:  Unknown   . Ditropan [Oxybutynin] Other (See Comments)    Reaction:  Unknown   . Hctz [Hydrochlorothiazide] Other (See Comments)    Reaction:  Causes pts sodium/potassium levels to drop significantly   . Metronidazole Other (See Comments)    Reaction:  Unknown   . Propranolol Other (See Comments)    Reaction:  Unknown   . Tavist [Clemastine] Other (See Comments)    Reaction:  Unknown   . Tramadol Other (See Comments)    Reaction:  Unknown   . Vicodin [Hydrocodone-Acetaminophen] Other (See Comments)    Reaction:  Unknown     REVIEW OF SYSTEMS:   Review of Systems  Constitutional: Negative.  Negative for chills, fever and malaise/fatigue.  HENT: Negative.  Negative for ear discharge, ear pain, hearing loss, nosebleeds and sore throat.   Eyes: Negative.  Negative for blurred vision and pain.       Peripheral visual deficit  Respiratory: Negative.  Negative for cough, hemoptysis, shortness of breath and wheezing.   Cardiovascular: Negative.  Negative for chest pain, palpitations and leg swelling.  Gastrointestinal: Negative.  Negative for abdominal pain, blood in stool, diarrhea, nausea and vomiting.  Genitourinary: Negative.  Negative for dysuria.  Musculoskeletal: Negative.  Negative for back pain.  Skin: Negative.   Neurological: Negative for  dizziness, tremors, speech change, focal weakness, seizures and headaches.  Endo/Heme/Allergies: Negative.  Does not bruise/bleed easily.  Psychiatric/Behavioral: Negative.  Negative for depression, hallucinations and suicidal ideas.    MEDICATIONS AT HOME:   Prior to Admission medications   Medication Sig Start Date End Date Taking? Authorizing Provider  acetaminophen (TYLENOL) 650 MG CR tablet Take 650 mg by mouth every 8 (eight) hours as needed for pain.    Historical Provider, MD  albuterol (PROVENTIL HFA;VENTOLIN HFA) 108 (90 BASE) MCG/ACT inhaler Inhale 1-2 puffs into the lungs every 4 (four) hours as needed for wheezing or shortness of breath.    Historical Provider, MD  atorvastatin (LIPITOR) 40 MG tablet Take 1 tablet (40 mg total) by mouth daily at 6 PM. 08/22/16   Enid Baas, MD  cholecalciferol (VITAMIN D) 1000 UNITS tablet Take 1,000 Units by mouth daily.    Historical Provider, MD  cloNIDine (CATAPRES) 0.1 MG tablet Take 0.1 mg by mouth 3 (three) times daily.    Historical Provider, MD  diltiazem (CARDIZEM CD) 120 MG 24 hr capsule Take 1 capsule (120 mg total) by mouth daily. 12/15/15   Wyatt Haste, MD  docusate sodium (COLACE) 100 MG capsule Take 100 mg by mouth 2 (two) times daily.     Historical Provider, MD  feeding supplement, ENSURE ENLIVE, (ENSURE ENLIVE) LIQD Take 237 mLs by mouth daily.     Historical Provider, MD  ferrous sulfate 325 (65 FE) MG tablet Take 325 mg by mouth daily.     Historical Provider, MD  fluticasone (FLONASE) 50 MCG/ACT nasal spray Place 2 sprays into both nostrils every morning.     Historical Provider, MD  fluticasone (FLOVENT HFA) 110 MCG/ACT inhaler Inhale 2 puffs into the lungs 2 (two) times daily. 1610,9604    Historical Provider, MD  furosemide (LASIX) 20 MG tablet Take 1 tablet (20 mg total) by mouth daily. Patient taking differently: Take 40 mg by mouth daily.  01/30/16   Houston Siren, MD  guaiFENesin-dextromethorphan (ROBITUSSIN DM)  100-10 MG/5ML syrup Take 10 mLs by mouth every 6 (six) hours as needed for cough. 05/04/16   Ramonita Lab, MD  levothyroxine (SYNTHROID, LEVOTHROID) 125 MCG tablet Take 125 mcg by mouth daily before breakfast.    Historical Provider, MD  LORazepam (ATIVAN) 0.5 MG tablet Take 1 tablet (0.5 mg total) by mouth 3 (three) times daily. Patient taking differently: Take 0.5 mg by mouth 3 (three) times daily. [id number] 02/25/16   Houston Siren, MD  losartan (COZAAR) 50 MG tablet Take 50 mg by mouth daily.    Historical Provider, MD  multivitamin-lutein (OCUVITE-LUTEIN) CAPS capsule Take 2 capsules by mouth daily.    Historical Provider, MD  Oxybutynin Chloride (GELNIQUE) 10 % GEL Place 1 application onto the skin daily. 05/25/16   Carollee Herter A McGowan, PA-C  polyethylene glycol (MIRALAX / GLYCOLAX) packet Take 17 g by mouth daily. *Mix in 4-8 ounces of fluid prior to taking*    Historical Provider, MD  ranitidine (ZANTAC) 150 MG tablet Take  150 mg by mouth 2 (two) times daily before a meal.     Historical Provider, MD  sodium bicarbonate 650 MG tablet Take 325 mg by mouth 4 (four) times daily.    Historical Provider, MD  spironolactone (ALDACTONE) 25 MG tablet Take 25 mg by mouth daily.    Historical Provider, MD  tiotropium (SPIRIVA) 18 MCG inhalation capsule Place 18 mcg into inhaler and inhale daily.    Historical Provider, MD  vitamin B-12 (CYANOCOBALAMIN) 1000 MCG tablet Take 1,000 mcg by mouth daily.    Historical Provider, MD      VITAL SIGNS:  Blood pressure (!) 113/98, pulse (!) 57, temperature 98.7 F (37.1 C), temperature source Oral, resp. rate 18, height 4\' 9"  (1.448 m), weight 47.6 kg (105 lb), SpO2 100 %.  PHYSICAL EXAMINATION:   Physical Exam  Constitutional: She is oriented to person, place, and time and well-developed, well-nourished, and in no distress. No distress.  HENT:  Head: Normocephalic.  Eyes: No scleral icterus.  Neck: Normal range of motion. Neck supple. No JVD  present. No tracheal deviation present.  Cardiovascular: Normal heart sounds.  Exam reveals no gallop and no friction rub.   No murmur heard. Irr, irr  Pulmonary/Chest: Effort normal and breath sounds normal. No respiratory distress. She has no wheezes. She has no rales. She exhibits no tenderness.  Abdominal: Soft. Bowel sounds are normal. She exhibits no distension and no mass. There is no tenderness. There is no rebound and no guarding.  Musculoskeletal: Normal range of motion. She exhibits no edema.  Neurological: She is alert and oriented to person, place, and time. A cranial nerve deficit (Right sided peripheral visual deficit) is present.  Skin: Skin is warm. No rash noted. No erythema.  Psychiatric: Affect and judgment normal.      LABORATORY PANEL:   CBC  Recent Labs Lab 09/04/16 1455  WBC 5.5  HGB 10.1*  HCT 29.5*  PLT 281   ------------------------------------------------------------------------------------------------------------------  Chemistries   Recent Labs Lab 09/04/16 1455  NA 133*  K 5.0  CL 94*  CO2 33*  GLUCOSE 90  BUN 24*  CREATININE 0.49  CALCIUM 9.4  AST 21  ALT 17  ALKPHOS 92  BILITOT 0.1*   ------------------------------------------------------------------------------------------------------------------  Cardiac Enzymes  Recent Labs Lab 09/04/16 1455  TROPONINI <0.03   ------------------------------------------------------------------------------------------------------------------  RADIOLOGY:  Dg Chest 2 View  Result Date: 09/04/2016 CLINICAL DATA:  83 y/o F; weakness. History of COPD and congestive heart failure. EXAM: CHEST  2 VIEW COMPARISON:  05/01/2016 chest radiograph FINDINGS: Stable cardiomegaly. Aortic atherosclerosis with calcification. Small left pleural effusion and left basilar opacity. Bones are demineralized. Mild multilevel degenerative changes of spine. IMPRESSION: Stable cardiomegaly. Aortic atherosclerosis.  Small left pleural effusion and left basilar opacity probably representing associated atelectasis. Electronically Signed   By: Mitzi HansenLance  Furusawa-Stratton M.D.   On: 09/04/2016 16:15   Ct Head Wo Contrast  Result Date: 09/04/2016 CLINICAL DATA:  Began feeling weak and dizzy this morning as right leg felt different. Recent intracranial bleed 08/19/2016. EXAM: CT HEAD WITHOUT CONTRAST TECHNIQUE: Contiguous axial images were obtained from the base of the skull through the vertex without intravenous contrast. COMPARISON:  Head CT 08/22/2016 and 08/20/2016 as well as 06/26/2015. MRI brain 08/21/2016. FINDINGS: Brain: The ventricles, cisterns and other CSF spaces are within normal. There is continued evidence of patient's subacute infarct over the left occipital region without significant change other than continued decrease density of the previously noted hemorrhagic component. Continue chronic ischemic microvascular  disease. No new areas of hemorrhage or infarction. Vascular: Unchanged Skull: Unchanged Sinuses/Orbits: Unchanged Other: None. IMPRESSION: Evidence of known subacute infarct over the left occipital region as the recently noted hemorrhagic component is no longer visualized. No new areas of infarction or hemorrhage. Chronic ischemic microvascular disease. Electronically Signed   By: Elberta Fortis M.D.   On: 09/04/2016 16:10    EKG:  Atrial fibrillation without ST elevation or depression  IMPRESSION AND PLAN:   83 year old female with a history of atrial fibrillation and recent CVA with hemorrhagic conversion and therefore not on anticoagulation who presents with right lower extremity weakness which has now resolved.  1. TIA with right lower extremity weakness: Order MRI to evaluate for underlying CVA Today CT shows no hemorrhage and therefore neurology is recommending starting patient on anti-platelet therapy. Patient cannot take aspirin 325 daily so I will start aspirin 81 mg daily Considering  recent hemorrhagic conversion I am still reluctant to start anticoagulation as is the patient. Continue statin therapy. Physical therapy consultation requested Neurology consultation requested.  2. Chronic atrial fibrillation: Start aspirin 81 mg daily and continue Cardiazem for heart rate control.  3. Essential hypertension: Continue Cardizem, Aldactone, clonidine and losartan  4. Pulmonary fibrosis on chronic oxygen  5. Chronic hyponatremia: Patient follows with nephrology and is on fluid or stricture with sodium bicarbonate tablets and Lasix daily  All the records are reviewed and case discussed with ED provider. Management plans discussed with the patient and she is in agreement  CODE STATUS: DNR  TOTAL TIME TAKING CARE OF THIS PATIENT: 45 minutes.    Lalanya Rufener M.D on 09/04/2016 at 4:55 PM  Between 7am to 6pm - Pager - [phone]  After 6pm go to [email or web address] - Social research officer, government  [facility]  Office  [phone]  CC: Primary care physician; Rafael Bihari, MD

## 2016-09-04 NOTE — ED Notes (Signed)
Pt with a-fib,irregular,  hr 38 on monitor. Pt without distress. bp 131/71. Hr now 45-60. Dr. Roxan Hockeyobinson and nurse Florentina AddisonKatie notified. No new orders.

## 2016-09-04 NOTE — ED Provider Notes (Signed)
[facility] Emergency Department Provider Note    First MD Initiated Contact with Patient 09/04/16 1515     (approximate)  I have reviewed the triage vital signs and the nursing notes.   HISTORY  Chief Complaint Weakness    HPI Alyssa Landry is a 83 y.o. female with complex past medical history presents with chief complaint of right lower extremity weakness that started early this morning and lasted roughly 1 hour. States she was otherwise feeling tired and weak generalized today. She went to move about to get into her chair to rest but was able to move her right lower extremity.  Home health nurse came to evaluate the patient is she is getting home care due to recent hemorrhagic stroke. She was recently stopped on all aspirin and Plavix as she had a history of previous TIA. She currently wears 2 L of home oxygen for history of COPD and congestive heart failure. States they checked on her she did have inability to move her right lower extremity. Denies any worsening headaches. No new visual disturbances. No numbness or tingling. States that the weakness has resolved. She states "on and get out of here soon as possible."   Past Medical History:  Diagnosis Date  . A-fib (HCC)   . Acid reflux   . Anemia   . Anxiety   . Arthritis   . Asthma   . Chronic hyponatremia   . COPD (chronic obstructive pulmonary disease) (HCC)   . GERD (gastroesophageal reflux disease)   . Hiatal hernia   . Hiatal hernia   . Hyperlipidemia   . Hypertension   . Hypothyroidism   . Incontinence   . Migraine   . Osteoporosis, post-menopausal   . Persistent cough   . Pulmonary fibrosis (HCC)   . SIADH (syndrome of inappropriate ADH production) (HCC)   . TIA (transient ischemic attack)    Family History  Problem Relation Age of Onset  . Leukemia Mother   . Heart disease Father   . Hypertension     Past Surgical History:  Procedure Laterality Date  . ABDOMINAL  HYSTERECTOMY  1966   Patient Active Problem List   Diagnosis Date Noted  . Stroke (HCC) 08/20/2016  . CHF (congestive heart failure) (HCC) 05/01/2016  . Chronic diastolic heart failure (HCC) 04/16/2016  . HTN (hypertension) 04/16/2016  . Hypoxia   . SOB (shortness of breath)   . Collapse of left lung   . Pleural effusion   . Centrilobular emphysema (HCC)   . Hyponatremia   . Pressure ulcer 02/19/2016  . Atrial fibrillation with RVR (HCC) 12/11/2015  . Urge incontinence 09/26/2015  . Lichenified rash 09/26/2015  . Syncopal episodes 09/25/2015      Prior to Admission medications   Medication Sig Start Date End Date Taking? Authorizing Provider  acetaminophen (TYLENOL) 650 MG CR tablet Take 650 mg by mouth every 8 (eight) hours as needed for pain.    Historical Provider, MD  albuterol (PROVENTIL HFA;VENTOLIN HFA) 108 (90 BASE) MCG/ACT inhaler Inhale 1-2 puffs into the lungs every 4 (four) hours as needed for wheezing or shortness of breath.    Historical Provider, MD  atorvastatin (LIPITOR) 40 MG tablet Take 1 tablet (40 mg total) by mouth daily at 6 PM. 08/22/16   Enid Baas, MD  cholecalciferol (VITAMIN D) 1000 UNITS tablet Take 1,000 Units by mouth daily.    Historical Provider, MD  cloNIDine (CATAPRES) 0.1 MG tablet Take 0.1 mg by mouth  3 (three) times daily.    Historical Provider, MD  diltiazem (CARDIZEM CD) 120 MG 24 hr capsule Take 1 capsule (120 mg total) by mouth daily. 12/15/15   Wyatt Haste, MD  docusate sodium (COLACE) 100 MG capsule Take 100 mg by mouth 2 (two) times daily.     Historical Provider, MD  feeding supplement, ENSURE ENLIVE, (ENSURE ENLIVE) LIQD Take 237 mLs by mouth daily.     Historical Provider, MD  ferrous sulfate 325 (65 FE) MG tablet Take 325 mg by mouth daily.     Historical Provider, MD  fluticasone (FLONASE) 50 MCG/ACT nasal spray Place 2 sprays into both nostrils every morning.     Historical Provider, MD  fluticasone (FLOVENT HFA) 110 MCG/ACT  inhaler Inhale 2 puffs into the lungs 2 (two) times daily. 4098,1191    Historical Provider, MD  furosemide (LASIX) 20 MG tablet Take 1 tablet (20 mg total) by mouth daily. Patient taking differently: Take 40 mg by mouth daily.  01/30/16   Houston Siren, MD  guaiFENesin-dextromethorphan (ROBITUSSIN DM) 100-10 MG/5ML syrup Take 10 mLs by mouth every 6 (six) hours as needed for cough. 05/04/16   Ramonita Lab, MD  levothyroxine (SYNTHROID, LEVOTHROID) 125 MCG tablet Take 125 mcg by mouth daily before breakfast.    Historical Provider, MD  LORazepam (ATIVAN) 0.5 MG tablet Take 1 tablet (0.5 mg total) by mouth 3 (three) times daily. Patient taking differently: Take 0.5 mg by mouth 3 (three) times daily. [id number] 02/25/16   Houston Siren, MD  losartan (COZAAR) 50 MG tablet Take 50 mg by mouth daily.    Historical Provider, MD  multivitamin-lutein (OCUVITE-LUTEIN) CAPS capsule Take 2 capsules by mouth daily.    Historical Provider, MD  Oxybutynin Chloride (GELNIQUE) 10 % GEL Place 1 application onto the skin daily. 05/25/16   Carollee Herter A McGowan, PA-C  polyethylene glycol (MIRALAX / GLYCOLAX) packet Take 17 g by mouth daily. *Mix in 4-8 ounces of fluid prior to taking*    Historical Provider, MD  ranitidine (ZANTAC) 150 MG tablet Take 150 mg by mouth 2 (two) times daily before a meal.     Historical Provider, MD  sodium bicarbonate 650 MG tablet Take 325 mg by mouth 4 (four) times daily.    Historical Provider, MD  spironolactone (ALDACTONE) 25 MG tablet Take 25 mg by mouth daily.    Historical Provider, MD  tiotropium (SPIRIVA) 18 MCG inhalation capsule Place 18 mcg into inhaler and inhale daily.    Historical Provider, MD  vitamin B-12 (CYANOCOBALAMIN) 1000 MCG tablet Take 1,000 mcg by mouth daily.    Historical Provider, MD    Allergies Aspirin; Codeine; Ditropan [oxybutynin]; Hctz [hydrochlorothiazide]; Metronidazole; Propranolol; Tavist [clemastine]; Tramadol; and Vicodin  [hydrocodone-acetaminophen]    Social History Social History  Substance Use Topics  . Smoking status: Never Smoker  . Smokeless tobacco: Never Used  . Alcohol use No    Review of Systems Patient denies headaches, rhinorrhea, blurry vision, numbness, shortness of breath, chest pain, edema, cough, abdominal pain, nausea, vomiting, diarrhea, dysuria, fevers, rashes or hallucinations unless otherwise stated above in HPI. ____________________________________________   PHYSICAL EXAM:  VITAL SIGNS: Vitals:   09/04/16 1450  BP: 138/65  Pulse: 75  Resp: 18  Temp: 98.7 F (37.1 C)    Constitutional: Alert and oriented. Elderly appearing, pleasant and in no acute distress. Eyes: Conjunctivae are normal. PERRL. EOMI. Head: Atraumatic. Nose: No congestion/rhinnorhea. Mouth/Throat: Mucous membranes are moist.  Oropharynx non-erythematous. Neck: No stridor.  Painless ROM. No cervical spine tenderness to palpation Hematological/Lymphatic/Immunilogical: No cervical lymphadenopathy. Cardiovascular: Normal rate, irregularly irregular. Grossly normal heart sounds.  Good peripheral circulation. Respiratory: Normal respiratory effort.  No retractions. Lungs with coarse breath sounds throughout Gastrointestinal: Soft and nontender. No distention. No abdominal bruits. No CVA tenderness. Musculoskeletal: No lower extremity tenderness nor edema.  No joint effusions. Neurologic:  Normal speech and language.5/5 strength BUE.  3/5 strenght in RLE with knee extrension and foot dorsi and plantar flexion.  5/5 strenght LLE.  SILT. normal FNF, CNi. Skin:  Skin is warm, dry and intact. No rash noted. Psychiatric: Mood and affect are normal. Speech and behavior are normal.  ____________________________________________   LABS (all labs ordered are listed, but only abnormal results are displayed)  Results for orders placed or performed during the hospital encounter of 09/04/16 (from the past 24 hour(s))    Basic metabolic panel     Status: Abnormal   Collection Time: 09/04/16  2:55 PM  Result Value Ref Range   Sodium 133 (L) 135 - 145 mmol/L   Potassium 5.0 3.5 - 5.1 mmol/L   Chloride 94 (L) 101 - 111 mmol/L   CO2 33 (H) 22 - 32 mmol/L   Glucose, Bld 90 65 - 99 mg/dL   BUN 24 (H) 6 - 20 mg/dL   Creatinine, Ser 1.61 0.44 - 1.00 mg/dL   Calcium 9.4 8.9 - 09.6 mg/dL   GFR calc non Af Amer >60 >60 mL/min   GFR calc Af Amer >60 >60 mL/min   Anion gap 6 5 - 15  CBC     Status: Abnormal   Collection Time: 09/04/16  2:55 PM  Result Value Ref Range   WBC 5.5 3.6 - 11.0 K/uL   RBC 3.39 (L) 3.80 - 5.20 MIL/uL   Hemoglobin 10.1 (L) 12.0 - 16.0 g/dL   HCT 04.5 (L) 40.9 - 81.1 %   MCV 86.8 80.0 - 100.0 fL   MCH 29.8 26.0 - 34.0 pg   MCHC 34.3 32.0 - 36.0 g/dL   RDW 91.4 78.2 - 95.6 %   Platelets 281 150 - 440 K/uL   ____________________________________________  EKG My review and personal interpretation at Time: 14:58   Indication: weakness  Rate: 60  Rhythm: afib Axis: normal Other: normal intervals, no STEMI ____________________________________________  RADIOLOGY  I personally reviewed all radiographic images ordered to evaluate for the above acute complaints and reviewed radiology reports and findings.  These findings were personally discussed with the patient.  Please see medical record for radiology report.  ____________________________________________   PROCEDURES  Procedure(s) performed:  Procedures    Critical Care performed: no ____________________________________________   INITIAL IMPRESSION / ASSESSMENT AND PLAN / ED COURSE  Pertinent labs & imaging results that were available during my care of the patient were reviewed by me and considered in my medical decision making (see chart for details).  DDX: cva, tia, hypoglycemia, dehydration, electrolyte abnormality, dissection, sepsis   Alyssa Landry is a 83 y.o. who presents to the ED with Symptoms as described  above. Patient with previous history of TIA and recent infarct with hemorrhagic conversion as well as risk as she does have history of A. fib. Patient afebrile and hemodynamically stable. Exam as above. We'll order CT imaging to evaluate for any evidence of CVA versus bleed.  Vision is otherwise hemodynamically stable.  The patient will be placed on continuous pulse oximetry and telemetry for monitoring.  Laboratory evaluation will be sent to evaluate for  the above complaints.     Clinical Course as of Sep 05 10  Fri Sep 04, 2016  1620 CT imaging with no evidence of new bleed or infarct. We'll touch base with Dr. Thad Rangereynolds with neurology regarding further recommendations.  [PR]  1634 Spoke with Dr. Thad Rangereynolds who is recommending observation in the hospital for further hemodynamic management as well as consideration of restarting antiplatelet therapy spoke with Dr. Juliene PinaMody who kindly agrees to speak with patient for admission.  Have discussed with the patient and available family all diagnostics and treatments performed thus far and all questions were answered to the best of my ability. The patient demonstrates understanding and agreement with plan.   [PR]    Clinical Course User Index [PR] Willy EddyPatrick Laritza Vokes, MD     ____________________________________________   FINAL CLINICAL IMPRESSION(S) / ED DIAGNOSES  Final diagnoses:  Transient cerebral ischemia, unspecified type  Atrial fibrillation with controlled ventricular rate (HCC)  Hyponatremia      NEW MEDICATIONS STARTED DURING THIS VISIT:  New Prescriptions   No medications on file     Note:  This document was prepared using Dragon voice recognition software and may include unintentional dictation errors.    Willy EddyPatrick Errick Salts, MD 09/05/16 [phone]

## 2016-09-04 NOTE — Progress Notes (Signed)
Anticoagulation monitoring(Lovenox):  83 yo female ordered Lovenox 40 mg Q24h  Filed Weights   09/04/16 1450  Weight: 105 lb (47.6 kg)   BMI    Lab Results  Component Value Date   CREATININE 0.49 09/04/2016   CREATININE 0.86 08/22/2016   CREATININE 0.69 08/21/2016   Estimated Creatinine Clearance: 28 mL/min (by C-G formula based on SCr of 0.49 mg/dL). Hemoglobin & Hematocrit     Component Value Date/Time   HGB 10.1 (L) 09/04/2016 1455   HGB 9.9 (L) 08/12/2012 0526   HCT 29.5 (L) 09/04/2016 1455   HCT 28.4 (L) 08/12/2012 [id number]     Per Protocol for Patient with estCrcl < 30 ml/min and BMI < 40, will transition to Lovenox 30 mg Q24h.

## 2016-09-04 NOTE — Progress Notes (Signed)
Family Meeting Note  Advance Directive:yes  Today a meeting took place with the Patient.anddaughter  The following clinical team members were present during this meeting:MD RN  The following were discussed:Patient's diagnosis: tia Atrial fibrillation Recent CVA, Patient's progosis: Unable to determine and Goals for treatment: DNR  Additional follow-up to be provided: none  Time spent during discussion:16 minutes  Briony Parveen, MD

## 2016-09-04 NOTE — ED Notes (Signed)
Copy of MOST form placed on chart.

## 2016-09-05 ENCOUNTER — Observation Stay: Payer: Medicare Other

## 2016-09-05 DIAGNOSIS — G459 Transient cerebral ischemic attack, unspecified: Secondary | ICD-10-CM | POA: Diagnosis not present

## 2016-09-05 LAB — LIPID PANEL
CHOL/HDL RATIO: 1.6 ratio
Cholesterol: 95 mg/dL (ref 0–200)
HDL: 58 mg/dL (ref >40)
LDL Cholesterol: 34 mg/dL (ref 0–99)
Triglycerides: 16 mg/dL (ref <150)
VLDL: 3 mg/dL (ref 0–40)

## 2016-09-05 MED ORDER — OXYBUTYNIN CHLORIDE 10 % TD GEL: 1.0000 | Freq: Every day | TRANSDERMAL | Status: DC

## 2016-09-05 MED ORDER — LOSARTAN POTASSIUM 50 MG PO TABS
100.0000 mg | ORAL_TABLET | Freq: Every day | ORAL | Status: DC
  Administered 2016-09-05: 100 mg via ORAL
  Filled 2016-09-05: qty 2

## 2016-09-05 MED ORDER — CLONIDINE HCL 0.1 MG PO TABS
0.1000 mg | ORAL_TABLET | Freq: Two times a day (BID) | ORAL | Status: DC
  Administered 2016-09-05: 09:00:00 0.1 mg via ORAL
  Filled 2016-09-05: qty 1

## 2016-09-05 MED ORDER — ASPIRIN 81 MG PO TBEC: 81.0000 mg | DELAYED_RELEASE_TABLET | Freq: Every day | ORAL | Status: AC

## 2016-09-05 NOTE — Care Management Note (Signed)
Case Management Note  Patient Details  Name: Alyssa Landry MRN: [id number] Date of Birth: 1919-03-18  Subjective/Objective:    Discharge home today with resumption of HH-RN services with [facility]. Call to Vickie at [facility] advising her of discharge today.                 Action/Plan:   Expected Discharge Date:  09/05/16               Expected Discharge Plan:     In-House Referral:     Discharge planning Services     Post Acute Care Choice:    Choice offered to:     DME Arranged:    DME Agency:     HH Arranged:    HH Agency:     Status of Service:     If discussed at [employer] Meetings, dates discussed:    Additional Comments:  Haitham Dolinsky A, RN 09/05/2016, 2:41 PM

## 2016-09-05 NOTE — Progress Notes (Signed)
[facility] advising that Dr Elpidio AnisSudini gave me a telephone order read back to MD to add Physical Therapy for balance training to the pt's Home Health services; Larita FifeLynn advised that she would contact Vickie at the home health agency to add this service

## 2016-09-05 NOTE — Evaluation (Addendum)
Physical Therapy Evaluation Patient Details Name: Alyssa Landry MRN: [id number] DOB: 10-23-1918 Today's Date: 09/05/2016   History of Present Illness  Alyssa Landry is a 83 y.o. 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 Patient reports that she woke up yesterday morning in her normal state at approximately 945 after eating breakfast she was feeling weak and dizzy and noticed that her right leg was very weak. She actually saw the neurologist yesterday for follow-up from her recent hospitalization with CVA and hemorrhagic conversion. During her last hospitalization she had hemorrhagic conversion and therefore antiplatelet therapy was not initiated. She does have a history of chronic atrial fibrillation. She denies headache and she states her symptoms have now resolved. She denies any other focal deficits. Neurology was consulted by ER physician who recommended observation. During last hospitalization patient underwent carotid ultrasound and echocardiogram which were essentially unremarkable. Brain MRI does not demonstrate any new infarcts but expected evolution of hemorrhagic infarct with interval development of cortical laminar necrosis  Clinical Impression  Pt admitted with above diagnosis. Pt currently with functional limitations due to the deficits listed below (see PT Problem List).  Pt demonstrates independent bed mobility and modified independent transfers with rolling walker. She is modified independent with a rolling walker for ambulation demonstrating excellent stability and safety awareness with gait and turns. Unable to find any deficits in strength or sensation in bilateral UE/LE. She continues to have R visual field deficits from initial CVA. She presents with higher level balance deficits with an  inability to attempt single leg balance due to fear of falling and instability. This is likely chronic and not related to current admission but she could still benefit from [facility] to work on some balance training to improve her function and decrease her fall risk. Pt has expressed her goal of working towards transitioning from her walker to a single point cane. Pt will benefit from skilled PT services to address deficits in strength, balance, and mobility in order to return to full function at home.     Follow Up Recommendations Home health PT;Other (comment) (For balance training)    Equipment Recommendations  None recommended by PT;Other (comment) (Continue to use rolling walker at discharge)    Recommendations for Other Services       Precautions / Restrictions Precautions Precautions: Fall Restrictions Weight Bearing Restrictions: No      Mobility  Bed Mobility Overal bed mobility: Independent             General bed mobility comments: HOB flat, no bed rails, fair speed. Good trunk control and strength noted  Transfers Overall transfer level: Modified independent Equipment used: Rolling walker (2 wheeled)             General transfer comment: Pt demonstrates fair speed/sequencing with sit to stand transfers. Once upright she is stable without UE support in wide stance. No deficits noted  Ambulation/Gait Ambulation/Gait assistance: Modified independent (Device/Increase time) Ambulation Distance (Feet): 60 Feet Assistive device: Rolling walker (2 wheeled) Gait Pattern/deviations: WFL(Within Functional Limits) Gait velocity: WFL for full household mobility Gait velocity interpretation: at or above normal speed for age/gender General Gait Details: Pt demonstrates excellent stability while ambulating with rolling walker. Fair speed and good safety with turns. Denies DOE during ambulation and demonsrates good confidence  Stairs  Wheelchair Mobility     Modified Rankin (Stroke Patients Only)       Balance Overall balance assessment: Needs assistance Sitting-balance support: No upper extremity supported Sitting balance-Leahy Scale: Normal     Standing balance support: No upper extremity supported Standing balance-Leahy Scale: Fair Standing balance comment: Pt able to maintain wide and narrow stance without UE support. Negative Rhomberg. Pt unable to attempt single leg balance due to instability and a lack of confidence                             Pertinent Vitals/Pain Pain Assessment: No/denies pain    Home Living Family/patient expects to be discharged to:: Private residence Living Arrangements: Children Available Help at Discharge: Family Type of Home: House Home Access: Stairs to enter;Other (comment) (has a chair lift)   Entrance Stairs-Number of Steps: 11 Home Layout: Two level Home Equipment: Walker - 2 wheels;Cane - single point;Other (comment) (Home O2 2L/min)      Prior Function Level of Independence: Independent with assistive device(s)         Comments: Independent with ADLs. Family assists with IADLs. Pt mostly uses RW, has been trying to get more comfortable with SPC     Hand Dominance   Dominant Hand: Right    Extremity/Trunk Assessment   Upper Extremity Assessment Upper Extremity Assessment: Overall WFL for tasks assessed    Lower Extremity Assessment Lower Extremity Assessment: Overall WFL for tasks assessed       Communication   Communication: HOH;No difficulties  Cognition Arousal/Alertness: Awake/alert Behavior During Therapy: WFL for tasks assessed/performed Overall Cognitive Status: Within Functional Limits for tasks assessed                      General Comments      Exercises     Assessment/Plan    PT Assessment Patient needs continued PT services  PT Problem List Decreased balance       PT Treatment Interventions DME instruction;Gait  training;Functional mobility training;Therapeutic activities;Therapeutic exercise;Balance training;Stair training;Neuromuscular re-education;Patient/family education    PT Goals (Current goals can be found in the Care Plan section)  Acute Rehab PT Goals Patient Stated Goal: Patient would like to return home with her daughter PT Goal Formulation: With patient/family Time For Goal Achievement: 09/19/16 Potential to Achieve Goals: Good    Frequency Min 2X/week   Barriers to discharge        Co-evaluation               End of Session Equipment Utilized During Treatment: Gait belt;Oxygen (2 liters) Activity Tolerance: Patient tolerated treatment well Patient left: with call bell/phone within reach;in bed;with bed alarm set;with family/visitor present   PT Visit Diagnosis: Unsteadiness on feet (R26.81)    Functional Assessment Tool Used: Clinical judgement;AM-PAC 6 Clicks Basic Mobility Functional Limitation: Mobility: Walking and moving around Mobility: Walking and Moving Around Current Status (Z6109): At least 1 percent but less than 20 percent impaired, limited or restricted Mobility: Walking and Moving Around Goal Status [phone]): At least 1 percent but less than 20 percent impaired, limited or restricted    Time: 1130-1150 PT Time Calculation (min) (ACUTE ONLY): 20 min   Charges:   PT Evaluation $PT Eval Low Complexity: 1 Procedure     PT G Codes:   PT G-Codes **NOT FOR INPATIENT CLASS** Functional Assessment Tool Used: Clinical judgement;AM-PAC 6 Clicks Basic Mobility Functional Limitation: Mobility: Walking and moving around  Mobility: Walking and Moving Around Current Status [phone](G8978): At least 1 percent but less than 20 percent impaired, limited or restricted Mobility: Walking and Moving Around Goal Status [phone](G8979): At least 1 percent but less than 20 percent impaired, limited or restricted    Lynnea MaizesJason D Huprich PT, DPT   Huprich,Jason 09/05/2016, 12:16 PM

## 2016-09-05 NOTE — Progress Notes (Signed)
Dr Sheryle Hailiamond made aware that pt having pauses total of 7 pauses over the past hour ranging from 2.16-2.47 sec, pt asymptomatic, VSS, per Dr Sheryle Hailiamond states that he will review pts chart, but no new orders, continue to monitor

## 2016-09-05 NOTE — Care Management Obs Status (Signed)
MEDICARE OBSERVATION STATUS NOTIFICATION   Patient Details  Name: Alyssa Landry MRN: [id number] Date of Birth: [birth year]   Medicare Observation Status Notification Given:  No (Discharge order in less than 24 hours)    Caren MacadamMichelle Meridith Romick, RN 09/05/2016, 3:02 PM

## 2016-09-05 NOTE — Progress Notes (Signed)
[facility]; verbally reviewed AVS with pt and pt's daughter, Alyssa Landry; no questions voiced at this time; Alyssa Landry getting pt dressed, brought in portable O2 for pt at 2L; discharge pending pt getting ready

## 2016-09-05 NOTE — Consult Note (Signed)
Referring Physician: Sudini    Chief Complaint: RLE weakness   HPI: Alyssa Landry is an 83 y.o. female with atrial fibrillation and recent infarct and hemorrhagic conversion earlier this month who presents after waking up yesterday morning in her normal state.  At approximately 945 after eating breakfast she was feeling weak and dizzy and noticed that her right leg was very weak. Patient has now returned to baseline.  Was on no antiplatelet or anticoagulation due to recent hemorrhage.  Initial NIHSS of 0.     Date last known well: Date: 09/05/2016 Time last known well: Time: 09:45 tPA Given: No: Recent hemorrhage and infarct, resolution of symptoms  Past Medical History:  Diagnosis Date  . A-fib (HCC)   . Acid reflux   . Anemia   . Anxiety   . Arthritis   . Asthma   . Chronic hyponatremia   . COPD (chronic obstructive pulmonary disease) (HCC)   . GERD (gastroesophageal reflux disease)   . Hiatal hernia   . Hiatal hernia   . Hyperlipidemia   . Hypertension   . Hypothyroidism   . Incontinence   . Migraine   . Osteoporosis, post-menopausal   . Persistent cough   . Pulmonary fibrosis (HCC)   . SIADH (syndrome of inappropriate ADH production) (HCC)   . TIA (transient ischemic attack)     Past Surgical History:  Procedure Laterality Date  . ABDOMINAL HYSTERECTOMY  1966    Family History  Problem Relation Age of Onset  . Leukemia Mother   . Heart disease Father   . Hypertension     Social History:  reports that she has never smoked. She has never used smokeless tobacco. She reports that she does not drink alcohol or use drugs.  Allergies:  Allergies  Allergen Reactions  . Aspirin Other (See Comments)    Upset stomach with high doses  . Codeine Other (See Comments)    Reaction:  Unknown   . Ditropan [Oxybutynin] Other (See Comments)    Reaction:  Unknown   . Hctz [Hydrochlorothiazide] Other (See Comments)    Reaction:  Causes pts sodium/potassium levels to drop  significantly   . Metronidazole Other (See Comments)    Reaction:  Unknown   . Propranolol Other (See Comments)    Reaction:  Unknown   . Tavist [Clemastine] Other (See Comments)    Reaction:  Unknown   . Tramadol Other (See Comments)    Reaction:  Unknown   . Vicodin [Hydrocodone-Acetaminophen] Other (See Comments)    Reaction:  Unknown     Medications:  I have reviewed the patient's current medications. Prior to Admission:  Prescriptions Prior to Admission  Medication Sig Dispense Refill Last Dose  . acetaminophen (TYLENOL) 650 MG CR tablet Take 650 mg by mouth every 8 (eight) hours as needed for pain.   prn at prn  . albuterol (PROVENTIL HFA;VENTOLIN HFA) 108 (90 BASE) MCG/ACT inhaler Inhale 1-2 puffs into the lungs every 4 (four) hours as needed for wheezing or shortness of breath.   prn at prn  . atorvastatin (LIPITOR) 40 MG tablet Take 1 tablet (40 mg total) by mouth daily at 6 PM. 30 tablet 2 09/03/2016 at qhs  . cholecalciferol (VITAMIN D) 1000 UNITS tablet Take 1,000 Units by mouth daily.   09/04/2016 at am  . cloNIDine (CATAPRES) 0.1 MG tablet Take 0.1 mg by mouth 3 (three) times daily.   09/04/2016 at 1200  . diltiazem (CARDIZEM CD) 120 MG 24 hr capsule  Take 1 capsule (120 mg total) by mouth daily. 30 capsule 0 09/04/2016 at am  . docusate sodium (COLACE) 100 MG capsule Take 100 mg by mouth 2 (two) times daily.    09/04/2016 at am  . ferrous sulfate 325 (65 FE) MG tablet Take 325 mg by mouth daily.    09/04/2016 at am  . fluticasone (FLOVENT HFA) 110 MCG/ACT inhaler Inhale 2 puffs into the lungs 2 (two) times daily. 4098,1191   09/04/2016 at am  . furosemide (LASIX) 20 MG tablet Take 1 tablet (20 mg total) by mouth daily. (Patient taking differently: Take 20 mg by mouth 2 (two) times daily. ) 30 tablet 1 09/04/2016 at am  . levothyroxine (SYNTHROID, LEVOTHROID) 125 MCG tablet Take 125 mcg by mouth daily before breakfast.   09/04/2016 at am  . LORazepam (ATIVAN) 0.5 MG tablet Take 1  tablet (0.5 mg total) by mouth 3 (three) times daily. 30 tablet 0 09/03/2016 at qhs  . losartan (COZAAR) 50 MG tablet Take 50 mg by mouth daily.   09/04/2016 at am  . multivitamin-lutein (OCUVITE-LUTEIN) CAPS capsule Take 2 capsules by mouth daily.   09/04/2016 at am  . Oxybutynin Chloride (GELNIQUE) 10 % GEL Place 1 application onto the skin daily. 30 g 6 09/04/2016 at am  . polyethylene glycol (MIRALAX / GLYCOLAX) packet Take 8.5-17 g by mouth daily. *Mix in 4-8 ounces of fluid prior to taking*   prn at prn  . ranitidine (ZANTAC) 150 MG tablet Take 150 mg by mouth 2 (two) times daily before a meal.    09/04/2016 at am  . sodium bicarbonate 650 MG tablet Take 325 mg by mouth 4 (four) times daily.   09/04/2016 at 1400  . spironolactone (ALDACTONE) 25 MG tablet Take 25 mg by mouth daily.   09/04/2016 at am  . tiotropium (SPIRIVA) 18 MCG inhalation capsule Place 18 mcg into inhaler and inhale daily.   09/04/2016 at am  . vitamin B-12 (CYANOCOBALAMIN) 1000 MCG tablet Take 1,000 mcg by mouth daily.   09/04/2016 at am  . feeding supplement, ENSURE ENLIVE, (ENSURE ENLIVE) LIQD Take 237 mLs by mouth daily.      . fluticasone (FLONASE) 50 MCG/ACT nasal spray Place 2 sprays into both nostrils every morning.    Not Taking at Unknown time  . guaiFENesin-dextromethorphan (ROBITUSSIN DM) 100-10 MG/5ML syrup Take 10 mLs by mouth every 6 (six) hours as needed for cough. (Patient not taking: Reported on 09/04/2016) 118 mL 0 Not Taking at Unknown time   Scheduled: . aspirin EC  81 mg Oral Daily  . atorvastatin  40 mg Oral q1800  . budesonide (PULMICORT) nebulizer solution  0.25 mg Nebulization BID  . cholecalciferol  1,000 Units Oral Daily  . cloNIDine  0.1 mg Oral BID  . diltiazem  120 mg Oral Daily  . docusate sodium  100 mg Oral BID  . enoxaparin (LOVENOX) injection  30 mg Subcutaneous Q24H  . feeding supplement (ENSURE ENLIVE)  237 mL Oral Daily  . ferrous sulfate  325 mg Oral Daily  . fluticasone  2 spray Each  Nare [id number]  . furosemide  40 mg Oral Daily  . levothyroxine  125 mcg Oral QAC breakfast  . LORazepam  0.5 mg Oral TID  . losartan  100 mg Oral Daily  . multivitamin-lutein  2 capsule Oral Daily  . sodium bicarbonate  325 mg Oral QID  . spironolactone  25 mg Oral Daily  . tiotropium  18 mcg Inhalation Daily  .  vitamin B-12  1,000 mcg Oral Daily    ROS: History obtained from the patient  General ROS: negative for - chills, fatigue, fever, night sweats, weight gain or weight loss Psychological ROS: negative for - behavioral disorder, hallucinations, memory difficulties, mood swings or suicidal ideation Ophthalmic ROS: negative for - blurry vision, double vision, eye pain or loss of vision ENT ROS: negative for - epistaxis, nasal discharge, oral lesions, sore throat, tinnitus or vertigo Allergy and Immunology ROS: negative for - hives or itchy/watery eyes Hematological and Lymphatic ROS: negative for - bleeding problems, bruising or swollen lymph nodes Endocrine ROS: negative for - galactorrhea, hair pattern changes, polydipsia/polyuria or temperature intolerance Respiratory ROS: negative for - cough, hemoptysis, shortness of breath or wheezing Cardiovascular ROS: negative for - chest pain, dyspnea on exertion, edema or irregular heartbeat Gastrointestinal ROS: negative for - abdominal pain, diarrhea, hematemesis, nausea/vomiting or stool incontinence Genito-Urinary ROS: negative for - dysuria, hematuria, incontinence or urinary frequency/urgency Musculoskeletal ROS: arthritic change and pain Neurological ROS: as noted in HPI Dermatological ROS: negative for rash and skin lesion changes  Physical Examination: Blood pressure (!) 153/55, pulse 61, temperature 97.8 F (36.6 C), temperature source Oral, resp. rate 18, height 4\' 9"  (1.448 m), weight 47.6 kg (105 lb), SpO2 100 %.  Gen: NAD HEENT-  Normocephalic, no lesions, without obvious abnormality.  Normal external eye and conjunctiva.   Normal TM's bilaterally.  Normal auditory canals and external ears. Normal external nose, mucus membranes and septum.  Normal pharynx. Cardiovascular- S1, S2 normal, pulses palpable throughout   Lungs- chest clear, no wheezing, rales, normal symmetric air entry Abdomen- soft, non-tender; bowel sounds normal; no masses,  no organomegaly Extremities- no edema Lymph-no adenopathy palpable Musculoskeletal-arthritic changes Skin-warm and dry, no hyperpigmentation, vitiligo, or suspicious lesions  Neurological Examination   Mental Status: Alert, oriented, thought content appropriate.  Speech fluent without evidence of aphasia.  Able to follow 3 step commands without difficulty. Cranial Nerves: II: Discs flat bilaterally; RHH, round, reactive to light and accommodation III,IV, VI: ptosis not present, extra-ocular motions intact bilaterally V,VII: smile symmetric, facial light touch sensation normal bilaterally VIII: hearing normal bilaterally IX,X: gag reflex present XI: bilateral shoulder shrug XII: midline tongue extension Motor: Right :  Upper extremity   5/5                                      Left:     Upper extremity   5/5             Lower extremity   5/5                                                  Lower extremity   5/5 Tone and bulk:normal tone throughout; no atrophy noted Sensory: Pinprick and light touch intact throughout, bilaterally Deep Tendon Reflexes: 2+ in the upper extremities, trace at the knees and absent at the ankles Plantars: Right: downgoing                                Left: upgoing Cerebellar: Normal finger-to-nose and normal heel-to-shin testing bilaterally Gait: not tested due to safety concerns      Laboratory Studies:  Basic  Metabolic Panel:  Recent Labs Lab 09/04/16 1455  NA 133*  K 5.0  CL 94*  CO2 33*  GLUCOSE 90  BUN 24*  CREATININE 0.49  CALCIUM 9.4    Liver Function Tests:  Recent Labs Lab 09/04/16 1455  AST 21  ALT 17   ALKPHOS 92  BILITOT 0.1*  PROT 7.0  ALBUMIN 4.0   No results for input(s): LIPASE, AMYLASE in the last 168 hours. No results for input(s): AMMONIA in the last 168 hours.  CBC:  Recent Labs Lab 09/04/16 1455  WBC 5.5  HGB 10.1*  HCT 29.5*  MCV 86.8  PLT 281    Cardiac Enzymes:  Recent Labs Lab 09/04/16 1455  TROPONINI <0.03    BNP: Invalid input(s): POCBNP  CBG: No results for input(s): GLUCAP in the last 168 hours.  Microbiology: Results for orders placed or performed during the hospital encounter of 08/20/16  MRSA PCR Screening     Status: Abnormal   Collection Time: 08/22/16  4:29 AM  Result Value Ref Range Status   MRSA by PCR POSITIVE (A) NEGATIVE Final    Comment:        The GeneXpert MRSA Assay (FDA approved for NASAL specimens only), is one component of a comprehensive MRSA colonization surveillance program. It is not intended to diagnose MRSA infection nor to guide or monitor treatment for MRSA infections. RESULT CALLED TO, READ BACK BY AND VERIFIED WITH: CHRISTINA WELCH ON 08/22/16 AT 0538 BY [facility]     Coagulation Studies: No results for input(s): LABPROT, INR in the last 72 hours.  Urinalysis:  Recent Labs Lab 09/04/16 1633  COLORURINE STRAW*  LABSPEC 1.009  PHURINE 7.0  GLUCOSEU NEGATIVE  HGBUR NEGATIVE  BILIRUBINUR NEGATIVE  KETONESUR NEGATIVE  PROTEINUR NEGATIVE  NITRITE NEGATIVE  LEUKOCYTESUR NEGATIVE    Lipid Panel:    Component Value Date/Time   CHOL 95 09/05/2016 0434   CHOL 220 (H) 08/10/2011 0619   TRIG 16 09/05/2016 0434   TRIG 73 08/10/2011 0619   HDL 58 09/05/2016 0434   HDL 106 (H) 08/10/2011 0619   CHOLHDL 1.6 09/05/2016 0434   VLDL 3 09/05/2016 0434   VLDL 15 08/10/2011 0619   LDLCALC 34 09/05/2016 0434   LDLCALC 99 08/10/2011 0619    HgbA1C:  Lab Results  Component Value Date   HGBA1C 5.6 08/20/2016    Urine Drug Screen:  No results found for: LABOPIA, COCAINSCRNUR, LABBENZ, AMPHETMU, THCU,  LABBARB  Alcohol Level: No results for input(s): ETH in the last 168 hours.  Other results: EKG: atrial fibrillation, rate 60 bpm.  Imaging: Dg Chest 2 View  Result Date: 09/04/2016 CLINICAL DATA:  83 y/o F; weakness. History of COPD and congestive heart failure. EXAM: CHEST  2 VIEW COMPARISON:  05/01/2016 chest radiograph FINDINGS: Stable cardiomegaly. Aortic atherosclerosis with calcification. Small left pleural effusion and left basilar opacity. Bones are demineralized. Mild multilevel degenerative changes of spine. IMPRESSION: Stable cardiomegaly. Aortic atherosclerosis. Small left pleural effusion and left basilar opacity probably representing associated atelectasis. Electronically Signed   By: Mitzi Hansen M.D.   On: 09/04/2016 16:15   Ct Head Wo Contrast  Result Date: 09/04/2016 CLINICAL DATA:  Began feeling weak and dizzy this morning as right leg felt different. Recent intracranial bleed 08/19/2016. EXAM: CT HEAD WITHOUT CONTRAST TECHNIQUE: Contiguous axial images were obtained from the base of the skull through the vertex without intravenous contrast. COMPARISON:  Head CT 08/22/2016 and 08/20/2016 as well as 06/26/2015. MRI brain 08/21/2016. FINDINGS:  Brain: The ventricles, cisterns and other CSF spaces are within normal. There is continued evidence of patient's subacute infarct over the left occipital region without significant change other than continued decrease density of the previously noted hemorrhagic component. Continue chronic ischemic microvascular disease. No new areas of hemorrhage or infarction. Vascular: Unchanged Skull: Unchanged Sinuses/Orbits: Unchanged Other: None. IMPRESSION: Evidence of known subacute infarct over the left occipital region as the recently noted hemorrhagic component is no longer visualized. No new areas of infarction or hemorrhage. Chronic ischemic microvascular disease. Electronically Signed   By: Elberta Fortisaniel  Boyle M.D.   On: 09/04/2016 16:10    Mr Brain Wo Contrast  Result Date: 09/05/2016 CLINICAL DATA:  New onset right lower extremity weakness. Recent left occipital lobe infarct with hemorrhagic conversion. EXAM: MRI HEAD WITHOUT CONTRAST TECHNIQUE: Multiplanar, multiecho pulse sequences of the brain and surrounding structures were obtained without intravenous contrast. COMPARISON:  CT head without contrast 09/04/2016 and MRI brain 08/21/2016. FINDINGS: Brain: Expected evolution of hemorrhagic infarct involving the left occipital lobe is again noted. The diffusion-weighted images demonstrate no new infarct. Edema and blood products are again noted within the left occipital lobe. Moderate diffuse periventricular and subcortical white matter disease is otherwise stable. Ventricles are proportionate to the degree of atrophy. Subsequent focal dilation of the left lateral ventricle is associated with the infarct. T1 shortening is compatible with cortical laminar necrosis. Vascular: Flow is present in the major intracranial arteries. Skull and upper cervical spine: The skullbase is within normal limits. Degenerative changes of the upper cervical spine are stable. Midline sagittal structures are unremarkable. Sinuses/Orbits: The paranasal sinuses and mastoid air cells are clear. Bilateral lens replacements are noted. The globes and orbits are otherwise within normal limits. IMPRESSION: 1. Expected evolution of hemorrhagic infarct involving the left occipital lobe with interval development of cortical laminar necrosis. 2. No new areas of infarct. 3. Stable atrophy and white matter disease. Electronically Signed   By: Marin Robertshristopher  Mattern M.D.   On: 09/05/2016 10:46    Assessment: 83 y.o. female with atrial fibrillation and recent infarct and hemorrhagic conversion earlier this month who presents with RLE weakness that has resolved.  MRI of the brain reviewed and shows no acute changes.  Resolution of hemorrhage noted on head CT.  Patient has been off  antiplatelet therapy since hemorrhage.  Was previously on ASA and Plavix. Patient has had recent stroke work up that does not need to be repeated at this time.    Stroke Risk Factors - atrial fibrillation, hyperlipidemia and hypertension  Plan: 1. Would restart ASA at 81mg  daily.  Discussed risks and benefits with daughter. 2.  Patient to continue follow up with Dr. Sherryll BurgerShah as an outpatient.  No further work up indicated at this time.     Thana FarrLeslie Terie Lear, MD Neurology [phone] 09/05/2016, 2:01 PM

## 2016-09-05 NOTE — Progress Notes (Signed)
Episodes of nonsustained bradycardia  HR 38-48, pt sleeping, but when aroused states that she feels fine, asymptomatic, no complaints

## 2016-09-05 NOTE — Care Management Note (Signed)
Case Management Note  Patient Details  Name: Alyssa Landry MRN: [id number] Date of Birth: 1918-11-23  Subjective/Objective:    Spoke with daughter [name] and explained Observation status for [name] at daughters request.                 Action/Plan:   Expected Discharge Date:                  Expected Discharge Plan:     In-House Referral:     Discharge planning Services     Post Acute Care Choice:    Choice offered to:     DME Arranged:    DME Agency:     HH Arranged:    HH Agency:     Status of Service:     If discussed at [employer] Length of Stay Meetings, dates discussed:    Additional Comments:  Merl Guardino A, RN 09/05/2016, 11:14 AM

## 2016-09-05 NOTE — Discharge Instructions (Addendum)
Resume diet and activity as before ° ° °

## 2016-09-05 NOTE — Progress Notes (Signed)
Pt discharged via wheelchair on portable O2 at 2L [place] via wheelchair to the visitor's entrance

## 2016-09-06 LAB — HEMOGLOBIN A1C
Hgb A1c MFr Bld: 5.3 % (ref 4.8–5.6)
MEAN PLASMA GLUCOSE: 105 mg/dL

## 2016-09-08 NOTE — Discharge Summary (Signed)
[facility] - [place] at [facility]   PATIENT NAME: Alyssa Landry    MR#:  [id number]  DATE OF BIRTH:  January 07, 1919  DATE OF ADMISSION:  09/04/2016 ADMITTING PHYSICIAN: Adrian SaranSital Mody, MD  DATE OF DISCHARGE: 09/05/2016  3:42 PM  PRIMARY CARE PHYSICIAN: Rafael BihariWALKER III, JOHN B, MD   ADMISSION DIAGNOSIS:  Hyponatremia [E87.1] Transient cerebral ischemia, unspecified type [G45.9] Atrial fibrillation with controlled ventricular rate (HCC) [I48.91]  DISCHARGE DIAGNOSIS:  Active Problems:   TIA (transient ischemic attack)   SECONDARY DIAGNOSIS:   Past Medical History:  Diagnosis Date  . A-fib (HCC)   . Acid reflux   . Anemia   . Anxiety   . Arthritis   . Asthma   . Chronic hyponatremia   . COPD (chronic obstructive pulmonary disease) (HCC)   . GERD (gastroesophageal reflux disease)   . Hiatal hernia   . Hiatal hernia   . Hyperlipidemia   . Hypertension   . Hypothyroidism   . Incontinence   . Migraine   . Osteoporosis, post-menopausal   . Persistent cough   . Pulmonary fibrosis (HCC)   . SIADH (syndrome of inappropriate ADH production) (HCC)   . TIA (transient ischemic attack)      ADMITTING HISTORY  HISTORY OF PRESENT ILLNESS:  Alyssa Dachnnie Swenson  is a 83 y.o. 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 Patient reports that she will cup this morning in her normal state at approximately 945 after eating breakfast she was feeling weak and dizzy and noticed that her right leg was very weak. She actually saw the neurologist yesterday for follow-up from her recent hospitalization with CVA and hemorrhagic conversion. During her last hospitalization she had hemorrhagic conversion and therefore antiplatelet therapy was not initiated. She does have a history of chronic atrial fibrillaiton. She  denies headache and she states her symptoms have now resolved. She denies any other focal deficits. Neurology was consulted by ER physician who recommended observation. During last hospitalization patient underwent carotid ultrasound and echocardiogram which were essentially unremarkable.   HOSPITAL COURSE:   * TIA Acute weakness had resolved quickly. Patient had MRI of the brain which showed no new stroke. Her prior hemorrhagic conversion around the stroke had resolved. Patient was started on aspirin. Statin continued. Seen by neurology Dr. Thad Rangereynolds. No further workup needed with her recent carotids and echo being normal. She does have atrial fibrillation and aspirin added for stroke prevention. Home health physical therapy has been set up.  Other comorbidities including chronic respiratory failure remained stable.  Discharged home in stable condition.  CONSULTS OBTAINED:  Treatment Team:  Thana FarrLeslie Reynolds, MD  DRUG ALLERGIES:   Allergies  Allergen Reactions  . Aspirin Other (See Comments)    Upset stomach with high doses  . Codeine Other (See Comments)    Reaction:  Unknown   . Ditropan [Oxybutynin] Other (See Comments)    Reaction:  Unknown   . Hctz [Hydrochlorothiazide] Other (See Comments)    Reaction:  Causes pts sodium/potassium levels to drop significantly   . Metronidazole Other (See Comments)    Reaction:  Unknown   . Propranolol Other (See Comments)    Reaction:  Unknown   . Tavist [Clemastine] Other (See Comments)    Reaction:  Unknown   . Tramadol Other (See Comments)    Reaction:  Unknown   . Vicodin [  Hydrocodone-Acetaminophen] Other (See Comments)    Reaction:  Unknown     DISCHARGE MEDICATIONS:   Discharge Medication List as of 09/05/2016  2:55 PM    START taking these medications   Details  aspirin EC 81 MG EC tablet Take 1 tablet (81 mg total) by mouth daily., Starting Sun 09/06/2016, No Print      CONTINUE these medications which have NOT CHANGED    Details  acetaminophen (TYLENOL) 650 MG CR tablet Take 650 mg by mouth every 8 (eight) hours as needed for pain., Historical Med    albuterol (PROVENTIL HFA;VENTOLIN HFA) 108 (90 BASE) MCG/ACT inhaler Inhale 1-2 puffs into the lungs every 4 (four) hours as needed for wheezing or shortness of breath., Until Discontinued, Historical Med    atorvastatin (LIPITOR) 40 MG tablet Take 1 tablet (40 mg total) by mouth daily at 6 PM., Starting Sat 08/22/2016, Normal    cholecalciferol (VITAMIN D) 1000 UNITS tablet Take 1,000 Units by mouth daily., Until Discontinued, Historical Med    cloNIDine (CATAPRES) 0.1 MG tablet Take 0.1 mg by mouth 3 (three) times daily., Until Discontinued, Historical Med    diltiazem (CARDIZEM CD) 120 MG 24 hr capsule Take 1 capsule (120 mg total) by mouth daily., Starting 12/15/2015, Until Discontinued, Print    docusate sodium (COLACE) 100 MG capsule Take 100 mg by mouth 2 (two) times daily. , Until Discontinued, Historical Med    ferrous sulfate 325 (65 FE) MG tablet Take 325 mg by mouth daily. , Historical Med    fluticasone (FLOVENT HFA) 110 MCG/ACT inhaler Inhale 2 puffs into the lungs 2 (two) times daily. 0930,1700, Historical Med    furosemide (LASIX) 20 MG tablet Take 1 tablet (20 mg total) by mouth daily., Starting Thu 01/30/2016, Print    levothyroxine (SYNTHROID, LEVOTHROID) 125 MCG tablet Take 125 mcg by mouth daily before breakfast., Historical Med    LORazepam (ATIVAN) 0.5 MG tablet Take 1 tablet (0.5 mg total) by mouth 3 (three) times daily., Starting Tue 02/25/2016, Print    losartan (COZAAR) 50 MG tablet Take 50 mg by mouth daily., Until Discontinued, Historical Med    multivitamin-lutein (OCUVITE-LUTEIN) CAPS capsule Take 2 capsules by mouth daily., Historical Med    Oxybutynin Chloride (GELNIQUE) 10 % GEL Place 1 application onto the skin daily., Starting Mon 05/25/2016, Normal    polyethylene glycol (MIRALAX / GLYCOLAX) packet Take 8.5-17 g by mouth  daily. *Mix in 4-8 ounces of fluid prior to taking*, Historical Med    ranitidine (ZANTAC) 150 MG tablet Take 150 mg by mouth 2 (two) times daily before a meal. , Until Discontinued, Historical Med    sodium bicarbonate 650 MG tablet Take 325 mg by mouth 4 (four) times daily., Historical Med    spironolactone (ALDACTONE) 25 MG tablet Take 25 mg by mouth daily., Historical Med    tiotropium (SPIRIVA) 18 MCG inhalation capsule Place 18 mcg into inhaler and inhale daily., Until Discontinued, Historical Med    vitamin B-12 (CYANOCOBALAMIN) 1000 MCG tablet Take 1,000 mcg by mouth daily., Until Discontinued, Historical Med    feeding supplement, ENSURE ENLIVE, (ENSURE ENLIVE) LIQD Take 237 mLs by mouth daily. , Until Discontinued, Historical Med    fluticasone (FLONASE) 50 MCG/ACT nasal spray Place 2 sprays into both nostrils every morning. , Until Discontinued, Historical Med    guaiFENesin-dextromethorphan (ROBITUSSIN DM) 100-10 MG/5ML syrup Take 10 mLs by mouth every 6 (six) hours as needed for cough., Starting Mon 05/04/2016, OTC  Today   VITAL SIGNS:  Blood pressure (!) 157/55, pulse 91, temperature 98.3 F (36.8 C), temperature source Oral, resp. rate 18, height 4\' 9"  (1.448 m), weight 47.6 kg (105 lb), SpO2 96 %.  I/O:  No intake or output data in the 24 hours ending 09/08/16 1233  PHYSICAL EXAMINATION:  Physical Exam  GENERAL:  83 y.o.-year-old patient lying in the bed with no acute distress.  LUNGS: Normal breath sounds bilaterally, no wheezing, rales,rhonchi or crepitation. No use of accessory muscles of respiration.  CARDIOVASCULAR: S1, S2 normal. No murmurs, rubs, or gallops.  ABDOMEN: Soft, non-tender, non-distended. Bowel sounds present. No organomegaly or mass.  NEUROLOGIC: Moves all 4 extremities. PSYCHIATRIC: The patient is alert and oriented x 3.  SKIN: No obvious rash, lesion, or ulcer.   DATA REVIEW:   CBC  Recent Labs Lab 09/04/16 1455  WBC 5.5   HGB 10.1*  HCT 29.5*  PLT 281    Chemistries   Recent Labs Lab 09/04/16 1455  NA 133*  K 5.0  CL 94*  CO2 33*  GLUCOSE 90  BUN 24*  CREATININE 0.49  CALCIUM 9.4  AST 21  ALT 17  ALKPHOS 92  BILITOT 0.1*    Cardiac Enzymes  Recent Labs Lab 09/04/16 1455  TROPONINI <0.03    Microbiology Results  Results for orders placed or performed during the hospital encounter of 08/20/16  MRSA PCR Screening     Status: Abnormal   Collection Time: 08/22/16  4:29 AM  Result Value Ref Range Status   MRSA by PCR POSITIVE (A) NEGATIVE Final    Comment:        The GeneXpert MRSA Assay (FDA approved for NASAL specimens only), is one component of a comprehensive MRSA colonization surveillance program. It is not intended to diagnose MRSA infection nor to guide or monitor treatment for MRSA infections. RESULT CALLED TO, READ BACK BY AND VERIFIED WITH: CHRISTINA WELCH ON 08/22/16 AT 0538 BY [facility]     RADIOLOGY:  No results found.  Follow up with PCP in 1 week.  Management plans discussed with the patient, family and they are in agreement.  CODE STATUS:  Code Status History    Date Active Date Inactive Code Status Order ID Comments User Context   09/04/2016  6:54 PM 09/05/2016  6:47 PM DNR [id number]  Adrian Saran, MD Inpatient   08/20/2016  5:11 PM 08/22/2016  6:30 PM DNR [id number]  Altamese Dilling, MD Inpatient   05/01/2016  6:48 PM 05/04/2016  7:55 PM DNR [id number]  Enedina Finner, MD Inpatient   02/18/2016 11:10 PM 02/25/2016  6:31 PM DNR [id number]  Tonye Royalty, DO Inpatient   01/26/2016  9:56 PM 01/30/2016  7:34 PM DNR [id number]  Houston Siren, MD Inpatient   12/11/2015  9:29 PM 12/15/2015  8:13 PM DNR [id number]  Ramonita Lab, MD Inpatient   09/25/2015  4:17 PM 09/26/2015 10:07 PM DNR [id number]  Altamese Dilling, MD ED    Questions for Most Recent Historical Code Status (Order [id number])    Question Answer Comment   In the event of cardiac or respiratory ARREST Do not  call a "code blue"    In the event of cardiac or respiratory ARREST Do not perform Intubation, CPR, defibrillation or ACLS    In the event of cardiac or respiratory ARREST Use medication by any route, position, wound care, and other measures to relive pain and suffering. May use oxygen, suction and manual treatment of airway obstruction as needed for  comfort.         Advance Directive Documentation   Flowsheet Row Most Recent Value  Type of Advance Directive  Healthcare Power of Attorney  Pre-existing out of facility DNR order (yellow form or pink MOST form)  No data  "MOST" Form in Place?  No data      TOTAL TIME TAKING CARE OF THIS PATIENT ON DAY OF DISCHARGE: more than 30 minutes.   Milagros Loll R M.D on 09/08/2016 at 12:33 PM  Between 7am to 6pm - Pager - [phone]  After 6pm go to [email or web address] - password [email or web address]  [facility] [place] [facility]  Office  [phone]  CC: Primary care physician; Rafael Bihari, MD  Note: This dictation was prepared with Dragon dictation along with smaller phrase technology. Any transcriptional errors that result from this process are unintentional.

## 2016-09-21 NOTE — Progress Notes (Signed)
09/22/2016 3:43 PM   Alyssa Landry [birth year] [id number]  Referring provider: Rafael Bihari, MD [phone] [facility] [address] [facility], [place], [place] [address]  Chief Complaint  Patient presents with  . Urinary Incontinence    HPI: 83 yo WF who presents today for a yearly follow up for urinary incontinence.  She is still using the oxybutynin topical gel with good results.  Today, she is experiencing urgency x 0-3, frequency x 8, does not limit fluid intake, engaging in toilet mapping, incontinence x 0-3 and nocturia x 0-3.    She is not having dysuria, gross hematuria or suprapubic pain.  She is not having fevers, chills, nausea or vomiting.   Her PVR today is 0 mL.    She has been hospitalized for TIA's and pneumonia recently.  There is a rash on her right thigh that has been there for several weeks.  She has been applying Nystatin cream without improvement.  It is pruritic.     PMH: Past Medical History:  Diagnosis Date  . A-fib (HCC)   . Acid reflux   . Anemia   . Anxiety   . Arthritis   . Asthma   . Chronic hyponatremia   . COPD (chronic obstructive pulmonary disease) (HCC)   . GERD (gastroesophageal reflux disease)   . Hiatal hernia   . Hiatal hernia   . Hyperlipidemia   . Hypertension   . Hypothyroidism   . Incontinence   . Migraine   . Osteoporosis, post-menopausal   . Persistent cough   . Pulmonary fibrosis (HCC)   . SIADH (syndrome of inappropriate ADH production) (HCC)   . TIA (transient ischemic attack)     Surgical History: Past Surgical History:  Procedure Laterality Date  . ABDOMINAL HYSTERECTOMY  1966    Home Medications:  Allergies as of 09/22/2016      Reactions   Aspirin Other (See Comments)   Upset stomach with high doses   Codeine Other (See Comments)   Reaction:  Unknown    Ditropan [oxybutynin] Other (See Comments)   Reaction:  Unknown    Hctz [hydrochlorothiazide] Other (See Comments)   Reaction:  Causes pts  sodium/potassium levels to drop significantly    Metronidazole Other (See Comments)   Reaction:  Unknown    Propranolol Other (See Comments)   Reaction:  Unknown    Tavist [clemastine] Other (See Comments)   Reaction:  Unknown    Tramadol Other (See Comments)   Reaction:  Unknown    Vicodin [hydrocodone-acetaminophen] Other (See Comments)   Reaction:  Unknown       Medication List       Accurate as of 09/22/16  3:43 PM. Always use your most recent med list.          acetaminophen 650 MG CR tablet Commonly known as:  TYLENOL Take 650 mg by mouth every 8 (eight) hours as needed for pain.   albuterol 108 (90 Base) MCG/ACT inhaler Commonly known as:  PROVENTIL HFA;VENTOLIN HFA Inhale 1-2 puffs into the lungs every 4 (four) hours as needed for wheezing or shortness of breath.   aspirin 81 MG EC tablet Take 1 tablet (81 mg total) by mouth daily.   atorvastatin 40 MG tablet Commonly known as:  LIPITOR Take 1 tablet (40 mg total) by mouth daily at 6 PM.   cholecalciferol 1000 units tablet Commonly known as:  VITAMIN D Take 1,000 Units by mouth daily.   cloNIDine 0.1 MG tablet  Commonly known as:  CATAPRES Take 0.1 mg by mouth 3 (three) times daily.   diltiazem 120 MG 24 hr capsule Commonly known as:  CARDIZEM CD Take 1 capsule (120 mg total) by mouth daily.   docusate sodium 100 MG capsule Commonly known as:  COLACE Take 100 mg by mouth 2 (two) times daily.   feeding supplement (ENSURE ENLIVE) Liqd Take 237 mLs by mouth daily.   ferrous sulfate 325 (65 FE) MG tablet Take 325 mg by mouth daily.   fluticasone 110 MCG/ACT inhaler Commonly known as:  FLOVENT HFA Inhale 2 puffs into the lungs 2 (two) times daily. 0930,1700   fluticasone 50 MCG/ACT nasal spray Commonly known as:  FLONASE Place 2 sprays into both nostrils every morning.   furosemide 20 MG tablet Commonly known as:  LASIX Take 1 tablet (20 mg total) by mouth daily.   guaiFENesin-dextromethorphan  100-10 MG/5ML syrup Commonly known as:  ROBITUSSIN DM Take 10 mLs by mouth every 6 (six) hours as needed for cough.   levothyroxine 125 MCG tablet Commonly known as:  SYNTHROID, LEVOTHROID Take 125 mcg by mouth daily before breakfast.   LORazepam 0.5 MG tablet Commonly known as:  ATIVAN Take 1 tablet (0.5 mg total) by mouth 3 (three) times daily.   losartan 50 MG tablet Commonly known as:  COZAAR Take 50 mg by mouth daily.   multivitamin-lutein Caps capsule Take 2 capsules by mouth daily.   Oxybutynin Chloride 10 % Gel Commonly known as:  GELNIQUE Place 1 application onto the skin daily.   polyethylene glycol packet Commonly known as:  MIRALAX / GLYCOLAX Take 8.5-17 g by mouth daily. *Mix in 4-8 ounces of fluid prior to taking*   ranitidine 150 MG tablet Commonly known as:  ZANTAC Take 150 mg by mouth 2 (two) times daily before a meal.   sodium bicarbonate 650 MG tablet Take 325 mg by mouth 4 (four) times daily.   spironolactone 25 MG tablet Commonly known as:  ALDACTONE Take 25 mg by mouth daily.   tiotropium 18 MCG inhalation capsule Commonly known as:  SPIRIVA Place 18 mcg into inhaler and inhale daily.   triamcinolone 0.025 % ointment Commonly known as:  KENALOG Apply 1 application topically 2 (two) times daily.   vitamin B-12 1000 MCG tablet Commonly known as:  CYANOCOBALAMIN Take 1,000 mcg by mouth daily.       Allergies:  Allergies  Allergen Reactions  . Aspirin Other (See Comments)    Upset stomach with high doses  . Codeine Other (See Comments)    Reaction:  Unknown   . Ditropan [Oxybutynin] Other (See Comments)    Reaction:  Unknown   . Hctz [Hydrochlorothiazide] Other (See Comments)    Reaction:  Causes pts sodium/potassium levels to drop significantly   . Metronidazole Other (See Comments)    Reaction:  Unknown   . Propranolol Other (See Comments)    Reaction:  Unknown   . Tavist [Clemastine] Other (See Comments)    Reaction:  Unknown     . Tramadol Other (See Comments)    Reaction:  Unknown   . Vicodin [Hydrocodone-Acetaminophen] Other (See Comments)    Reaction:  Unknown     Family History: Family History  Problem Relation Age of Onset  . Leukemia Mother   . Heart disease Father   . Hypertension      Social History:  reports that she has never smoked. She has never used smokeless tobacco. She reports that she does not  drink alcohol or use drugs.  ROS: UROLOGY Frequent Urination?: Yes Hard to postpone urination?: Yes Burning/pain with urination?: No Get up at night to urinate?: Yes Leakage of urine?: Yes Urine stream starts and stops?: No Trouble starting stream?: No Do you have to strain to urinate?: No Blood in urine?: No Urinary tract infection?: No Sexually transmitted disease?: No Injury to kidneys or bladder?: No Painful intercourse?: No Weak stream?: No Currently pregnant?: No Vaginal bleeding?: No Last menstrual period?: n  Gastrointestinal Nausea?: No Vomiting?: No Indigestion/heartburn?: No Diarrhea?: No Constipation?: No  Constitutional Fever: No Night sweats?: No Weight loss?: No Fatigue?: No  Skin Skin rash/lesions?: Yes Itching?: No  Eyes Blurred vision?: No Double vision?: No  Ears/Nose/Throat Sore throat?: No Sinus problems?: No  Hematologic/Lymphatic Swollen glands?: No Easy bruising?: Yes  Cardiovascular Leg swelling?: No Chest pain?: No  Respiratory Cough?: No Shortness of breath?: No  Endocrine Excessive thirst?: No  Musculoskeletal Back pain?: No Joint pain?: No  Neurological Headaches?: No Dizziness?: No  Psychologic Depression?: No Anxiety?: No  Physical Exam: BP (!) 159/65 (BP Location: Left Arm, Patient Position: Sitting, Cuff Size: Small)   Pulse (!) 56   Ht 4\' 9"  (1.448 m)   Wt 104 lb 11.2 oz (47.5 kg)   BMI 22.66 kg/m   Constitutional: Well nourished. Alert and oriented, No acute distress. HEENT: [place] AT, moist mucus  membranes. Trachea midline, no masses. Cardiovascular: No clubbing, cyanosis, or edema. Respiratory: Normal respiratory effort, no increased work of breathing. GI: Abdomen is soft, non tender, non distended, no abdominal masses. Liver and spleen not palpable.  No hernias appreciated.  Stool sample for occult testing is not indicated.   GU: No CVA tenderness.  No bladder fullness or masses.  External genitalia with lichenification, normal pubic hair distribution, no lesions.  Urethral carbuncle is noted   No urethral masses, tenderness and/or tenderness. No bladder fullness, tenderness or masses. Normal vagina mucosa, good estrogen effect, no discharge, no lesions, good pelvic support, no cystocele or rectocele noted.  Uterus, cervix and ovaries are surgically absent.   No pelvic masses are noted. Anus and perineum are without rashes or lesions.    Skin: Rash on her right thigh, not raised, 7 cm x 5 cm.  Nystatin cream not effective.  No bruises or suspicious lesions. Lymph: No cervical or inguinal adenopathy. Neurologic: Grossly intact, no focal deficits, moving all 4 extremities. Psychiatric: Normal mood and affect.  Laboratory Data: Lab Results  Component Value Date   WBC 5.5 09/04/2016   HGB 10.1 (L) 09/04/2016   HCT 29.5 (L) 09/04/2016   MCV 86.8 09/04/2016   PLT 281 09/04/2016   Lab Results  Component Value Date   CREATININE 0.49 09/04/2016   Lab Results  Component Value Date   TSH 0.736 12/12/2015      Component Value Date/Time   CHOL 95 09/05/2016 0434   CHOL 220 (H) 08/10/2011 0619   HDL 58 09/05/2016 0434   HDL 106 (H) 08/10/2011 0619   CHOLHDL 1.6 09/05/2016 0434   VLDL 3 09/05/2016 0434   VLDL 15 08/10/2011 0619   LDLCALC 34 09/05/2016 0434   LDLCALC 99 08/10/2011 0619   Lab Results  Component Value Date   AST 21 09/04/2016   Lab Results  Component Value Date   ALT 17 09/04/2016    Pertinent Imaging: Results for Alyssa, Landry (MRN [id number]) as of  09/23/2016 11:03  Ref. Range 09/22/2016 15:14  Scan Result Unknown 0  Assessment & Plan:    1. Urge incontinence:   Patient's urge incontinence is reduced with oxybutynin gel.  She will continue that prescription.  She will return in 1 year for PVR and exam.  - BLADDER SCAN AMB NON-IMAGING  2. Lichenification:    Patient was found to have lichenification of her labia.  I have advised her and her daughter to apply protective barrier creams to the labial to prevent irritation from the incontinence pads.  She return in 1 year for an exam.    3. Rash  - prescribed triamcinolone cream bid  - patient to call back if no improvement  Return in about 1 year (around 09/22/2017) for PVR and OAB questionnaire.  These notes generated with voice recognition software. I apologize for typographical errors.  Michiel CowboySHANNON Erine Phenix, PA-C  [facility] [address], [address] [place], [place] [address] [phone]

## 2016-09-22 ENCOUNTER — Ambulatory Visit (INDEPENDENT_AMBULATORY_CARE_PROVIDER_SITE_OTHER): Payer: Medicare Other | Admitting: Urology

## 2016-09-22 ENCOUNTER — Encounter: Payer: Self-pay | Admitting: Urology

## 2016-09-22 VITALS — BP 159/65 | HR 56 | Ht <= 58 in | Wt 104.7 lb

## 2016-09-22 DIAGNOSIS — L280 Lichen simplex chronicus: Secondary | ICD-10-CM | POA: Diagnosis not present

## 2016-09-22 DIAGNOSIS — N3941 Urge incontinence: Secondary | ICD-10-CM | POA: Diagnosis not present

## 2016-09-22 DIAGNOSIS — R21 Rash and other nonspecific skin eruption: Secondary | ICD-10-CM

## 2016-09-22 DIAGNOSIS — I639 Cerebral infarction, unspecified: Secondary | ICD-10-CM

## 2016-09-22 LAB — BLADDER SCAN AMB NON-IMAGING: Scan Result: 0

## 2016-09-22 MED ORDER — TRIAMCINOLONE ACETONIDE 0.025 % EX OINT
1.0000 | TOPICAL_OINTMENT | Freq: Two times a day (BID) | CUTANEOUS | 0 refills | Status: AC
Start: 2016-09-22 — End: ?

## 2016-10-08 ENCOUNTER — Emergency Department: Payer: Medicare Other

## 2016-10-08 ENCOUNTER — Inpatient Hospital Stay
Admission: EM | Admit: 2016-10-08 | Discharge: 2016-10-12 | DRG: 871 | Disposition: A | Discharge status: Skilled Nursing Facility | Payer: Medicare Other | Attending: Internal Medicine | Admitting: Internal Medicine

## 2016-10-08 DIAGNOSIS — E039 Hypothyroidism, unspecified: Secondary | ICD-10-CM | POA: Diagnosis present

## 2016-10-08 DIAGNOSIS — Z79899 Other long term (current) drug therapy: Secondary | ICD-10-CM

## 2016-10-08 DIAGNOSIS — E785 Hyperlipidemia, unspecified: Secondary | ICD-10-CM | POA: Diagnosis present

## 2016-10-08 DIAGNOSIS — I5032 Chronic diastolic (congestive) heart failure: Secondary | ICD-10-CM | POA: Diagnosis present

## 2016-10-08 DIAGNOSIS — J189 Pneumonia, unspecified organism: Secondary | ICD-10-CM | POA: Diagnosis present

## 2016-10-08 DIAGNOSIS — E222 Syndrome of inappropriate secretion of antidiuretic hormone: Secondary | ICD-10-CM | POA: Diagnosis present

## 2016-10-08 DIAGNOSIS — F419 Anxiety disorder, unspecified: Secondary | ICD-10-CM | POA: Diagnosis present

## 2016-10-08 DIAGNOSIS — J440 Chronic obstructive pulmonary disease with acute lower respiratory infection: Secondary | ICD-10-CM | POA: Diagnosis present

## 2016-10-08 DIAGNOSIS — Y95 Nosocomial condition: Secondary | ICD-10-CM | POA: Diagnosis present

## 2016-10-08 DIAGNOSIS — Z8249 Family history of ischemic heart disease and other diseases of the circulatory system: Secondary | ICD-10-CM

## 2016-10-08 DIAGNOSIS — M810 Age-related osteoporosis without current pathological fracture: Secondary | ICD-10-CM | POA: Diagnosis present

## 2016-10-08 DIAGNOSIS — Z90710 Acquired absence of both cervix and uterus: Secondary | ICD-10-CM

## 2016-10-08 DIAGNOSIS — I69398 Other sequelae of cerebral infarction: Secondary | ICD-10-CM

## 2016-10-08 DIAGNOSIS — Z888 Allergy status to other drugs, medicaments and biological substances status: Secondary | ICD-10-CM | POA: Diagnosis not present

## 2016-10-08 DIAGNOSIS — Z9981 Dependence on supplemental oxygen: Secondary | ICD-10-CM | POA: Diagnosis not present

## 2016-10-08 DIAGNOSIS — A419 Sepsis, unspecified organism: Secondary | ICD-10-CM | POA: Diagnosis not present

## 2016-10-08 DIAGNOSIS — I482 Chronic atrial fibrillation: Secondary | ICD-10-CM | POA: Diagnosis present

## 2016-10-08 DIAGNOSIS — R0602 Shortness of breath: Secondary | ICD-10-CM | POA: Diagnosis present

## 2016-10-08 DIAGNOSIS — I248 Other forms of acute ischemic heart disease: Secondary | ICD-10-CM | POA: Diagnosis present

## 2016-10-08 DIAGNOSIS — I110 Hypertensive heart disease with heart failure: Secondary | ICD-10-CM | POA: Diagnosis present

## 2016-10-08 DIAGNOSIS — Z885 Allergy status to narcotic agent status: Secondary | ICD-10-CM | POA: Diagnosis not present

## 2016-10-08 DIAGNOSIS — J8410 Pulmonary fibrosis, unspecified: Secondary | ICD-10-CM | POA: Diagnosis present

## 2016-10-08 DIAGNOSIS — Z7982 Long term (current) use of aspirin: Secondary | ICD-10-CM

## 2016-10-08 DIAGNOSIS — K219 Gastro-esophageal reflux disease without esophagitis: Secondary | ICD-10-CM | POA: Diagnosis present

## 2016-10-08 DIAGNOSIS — Z886 Allergy status to analgesic agent status: Secondary | ICD-10-CM | POA: Diagnosis not present

## 2016-10-08 DIAGNOSIS — Z7951 Long term (current) use of inhaled steroids: Secondary | ICD-10-CM

## 2016-10-08 DIAGNOSIS — H5461 Unqualified visual loss, right eye, normal vision left eye: Secondary | ICD-10-CM | POA: Diagnosis present

## 2016-10-08 DIAGNOSIS — R0600 Dyspnea, unspecified: Secondary | ICD-10-CM

## 2016-10-08 DIAGNOSIS — Z66 Do not resuscitate: Secondary | ICD-10-CM | POA: Diagnosis present

## 2016-10-08 LAB — COMPREHENSIVE METABOLIC PANEL
ALT: 18 U/L (ref 14–54)
AST: 20 U/L (ref 15–41)
Albumin: 4.1 g/dL (ref 3.5–5.0)
Alkaline Phosphatase: 86 U/L (ref 38–126)
Anion gap: 6 (ref 5–15)
BUN: 28 mg/dL — AB (ref 6–20)
CHLORIDE: 93 mmol/L — AB (ref 101–111)
CO2: 32 mmol/L (ref 22–32)
CREATININE: 0.64 mg/dL (ref 0.44–1.00)
Calcium: 9.2 mg/dL (ref 8.9–10.3)
GFR calc Af Amer: >60 mL/min (ref >60)
GFR calc non Af Amer: >60 mL/min (ref >60)
GLUCOSE: 120 mg/dL — AB (ref 65–99)
Potassium: 4.9 mmol/L (ref 3.5–5.1)
SODIUM: 131 mmol/L — AB (ref 135–145)
Total Bilirubin: 0.7 mg/dL (ref 0.3–1.2)
Total Protein: 7.2 g/dL (ref 6.5–8.1)

## 2016-10-08 LAB — CBC WITH DIFFERENTIAL/PLATELET
BASOS ABS: 0.1 10*3/uL (ref 0–0.1)
Basophils Relative: 1 %
Eosinophils Absolute: 0 10*3/uL (ref 0–0.7)
Eosinophils Relative: 0 %
HCT: 31.5 % — ABNORMAL LOW (ref 35.0–47.0)
Hemoglobin: 10.6 g/dL — ABNORMAL LOW (ref 12.0–16.0)
Lymphocytes Relative: 3 %
Lymphs Abs: 0.5 10*3/uL — ABNORMAL LOW (ref 1.0–3.6)
MCH: 29.1 pg (ref 26.0–34.0)
MCHC: 33.7 g/dL (ref 32.0–36.0)
MCV: 86.3 fL (ref 80.0–100.0)
Monocytes Absolute: 1.4 10*3/uL — ABNORMAL HIGH (ref 0.2–0.9)
Monocytes Relative: 8 %
Neutro Abs: 17.2 10*3/uL — ABNORMAL HIGH (ref 1.4–6.5)
Neutrophils Relative %: 88 %
PLATELETS: 275 10*3/uL (ref 150–440)
RBC: 3.65 MIL/uL — AB (ref 3.80–5.20)
RDW: 13.6 % (ref 11.5–14.5)
WBC: 19.2 10*3/uL — AB (ref 3.6–11.0)

## 2016-10-08 LAB — BRAIN NATRIURETIC PEPTIDE: B Natriuretic Peptide: 408 pg/mL — ABNORMAL HIGH (ref 0.0–100.0)

## 2016-10-08 LAB — TROPONIN I: Troponin I: 0.14 ng/mL (ref <0.03)

## 2016-10-08 MED ORDER — ALBUTEROL SULFATE (2.5 MG/3ML) 0.083% IN NEBU
2.5000 mg | INHALATION_SOLUTION | Freq: Four times a day (QID) | RESPIRATORY_TRACT | Status: DC | PRN
  Administered 2016-10-08: 2.5 mg via RESPIRATORY_TRACT
  Filled 2016-10-08: qty 3

## 2016-10-08 MED ORDER — LEVOFLOXACIN IN D5W 750 MG/150ML IV SOLN
750.0000 mg | Freq: Once | INTRAVENOUS | Status: AC
  Administered 2016-10-08: 750 mg via INTRAVENOUS
  Filled 2016-10-08: qty 150

## 2016-10-08 NOTE — ED Provider Notes (Signed)
[facility] Emergency Department Provider Note  Time seen: 9:03 PM  I have reviewed the triage vital signs and the nursing notes.   HISTORY  Chief Complaint Shortness of Breath    HPI Alyssa Landry is a 83 y.o. female With a past medical history of atrial fibrillation, anemia, arthritis, asthma, COPD on 2 L nasal cannula, gastric reflux, hypertension, hyperlipidemia, TIA, presents to the emergency department with shortness of breath. According to family they state the patient has been appearing short of breath all day with increased respiratory rate and occasional coughing. Tonight at 4 PM family states the patient began coughing uncontrollably, was nauseated with vomiting. Family gave the patient a breathing treatment but did not note any improvement so they called a nurse who came to the house to evaluate the patient. She stated it sounded like the patient had rhonchi in the chest and recommended  transport to the emergency department for further evaluation. Here in the emergency department the patient denies any shortness of breath however family states the patient was denying it at home as well when she was clearly appearing short of breath. Patient does have an occasional cough. She denies any chest pain. Patient states she is feeling very tired.  Past Medical History:  Diagnosis Date  . A-fib (HCC)   . Acid reflux   . Anemia   . Anxiety   . Arthritis   . Asthma   . Chronic hyponatremia   . COPD (chronic obstructive pulmonary disease) (HCC)   . GERD (gastroesophageal reflux disease)   . Hiatal hernia   . Hiatal hernia   . Hyperlipidemia   . Hypertension   . Hypothyroidism   . Incontinence   . Migraine   . Osteoporosis, post-menopausal   . Persistent cough   . Pulmonary fibrosis (HCC)   . SIADH (syndrome of inappropriate ADH production) (HCC)   . TIA (transient ischemic attack)     Patient Active Problem List   Diagnosis Date Noted  . TIA  (transient ischemic attack) 09/04/2016  . Stroke (HCC) 08/20/2016  . CHF (congestive heart failure) (HCC) 05/01/2016  . Chronic diastolic heart failure (HCC) 04/16/2016  . HTN (hypertension) 04/16/2016  . Hypoxia   . SOB (shortness of breath)   . Collapse of left lung   . Pleural effusion   . Centrilobular emphysema (HCC)   . Hyponatremia   . Pressure ulcer 02/19/2016  . Atrial fibrillation with RVR (HCC) 12/11/2015  . Urge incontinence 09/26/2015  . Lichenified rash 09/26/2015  . Syncopal episodes 09/25/2015    Past Surgical History:  Procedure Laterality Date  . ABDOMINAL HYSTERECTOMY  1966    Prior to Admission medications   Medication Sig Start Date End Date Taking? Authorizing Provider  acetaminophen (TYLENOL) 650 MG CR tablet Take 650 mg by mouth every 8 (eight) hours as needed for pain.    Historical Provider, MD  albuterol (PROVENTIL HFA;VENTOLIN HFA) 108 (90 BASE) MCG/ACT inhaler Inhale 1-2 puffs into the lungs every 4 (four) hours as needed for wheezing or shortness of breath.    Historical Provider, MD  aspirin EC 81 MG EC tablet Take 1 tablet (81 mg total) by mouth daily. 09/06/16   Milagros LollSrikar Sudini, MD  atorvastatin (LIPITOR) 40 MG tablet Take 1 tablet (40 mg total) by mouth daily at 6 PM. 08/22/16   Enid Baasadhika Kalisetti, MD  cholecalciferol (VITAMIN D) 1000 UNITS tablet Take 1,000 Units by mouth daily.    Historical Provider, MD  cloNIDine (CATAPRES)  0.1 MG tablet Take 0.1 mg by mouth 3 (three) times daily.    Historical Provider, MD  diltiazem (CARDIZEM CD) 120 MG 24 hr capsule Take 1 capsule (120 mg total) by mouth daily. 12/15/15   Wyatt Haste, MD  docusate sodium (COLACE) 100 MG capsule Take 100 mg by mouth 2 (two) times daily.     Historical Provider, MD  feeding supplement, ENSURE ENLIVE, (ENSURE ENLIVE) LIQD Take 237 mLs by mouth daily.     Historical Provider, MD  ferrous sulfate 325 (65 FE) MG tablet Take 325 mg by mouth daily.     Historical Provider, MD   fluticasone (FLONASE) 50 MCG/ACT nasal spray Place 2 sprays into both nostrils every morning.     Historical Provider, MD  fluticasone (FLOVENT HFA) 110 MCG/ACT inhaler Inhale 2 puffs into the lungs 2 (two) times daily. 1610,9604    Historical Provider, MD  furosemide (LASIX) 20 MG tablet Take 1 tablet (20 mg total) by mouth daily. Patient taking differently: Take 20 mg by mouth 2 (two) times daily.  01/30/16   Houston Siren, MD  guaiFENesin-dextromethorphan (ROBITUSSIN DM) 100-10 MG/5ML syrup Take 10 mLs by mouth every 6 (six) hours as needed for cough. Patient not taking: Reported on 09/04/2016 05/04/16   Ramonita Lab, MD  levothyroxine (SYNTHROID, LEVOTHROID) 125 MCG tablet Take 125 mcg by mouth daily before breakfast.    Historical Provider, MD  LORazepam (ATIVAN) 0.5 MG tablet Take 1 tablet (0.5 mg total) by mouth 3 (three) times daily. 02/25/16   Houston Siren, MD  losartan (COZAAR) 50 MG tablet Take 50 mg by mouth daily.    Historical Provider, MD  multivitamin-lutein (OCUVITE-LUTEIN) CAPS capsule Take 2 capsules by mouth daily.    Historical Provider, MD  Oxybutynin Chloride (GELNIQUE) 10 % GEL Place 1 application onto the skin daily. 05/25/16   Carollee Herter A McGowan, PA-C  polyethylene glycol (MIRALAX / GLYCOLAX) packet Take 8.5-17 g by mouth daily. *Mix in 4-8 ounces of fluid prior to taking*    Historical Provider, MD  ranitidine (ZANTAC) 150 MG tablet Take 150 mg by mouth 2 (two) times daily before a meal.     Historical Provider, MD  sodium bicarbonate 650 MG tablet Take 325 mg by mouth 4 (four) times daily.    Historical Provider, MD  spironolactone (ALDACTONE) 25 MG tablet Take 25 mg by mouth daily.    Historical Provider, MD  tiotropium (SPIRIVA) 18 MCG inhalation capsule Place 18 mcg into inhaler and inhale daily.    Historical Provider, MD  triamcinolone (KENALOG) 0.025 % ointment Apply 1 application topically 2 (two) times daily. 09/22/16   Harle Battiest, PA-C  vitamin B-12  (CYANOCOBALAMIN) 1000 MCG tablet Take 1,000 mcg by mouth daily.    Historical Provider, MD    Allergies  Allergen Reactions  . Aspirin Other (See Comments)    Upset stomach with high doses  . Codeine Other (See Comments)    Reaction:  Unknown   . Ditropan [Oxybutynin] Other (See Comments)    Reaction:  Unknown   . Hctz [Hydrochlorothiazide] Other (See Comments)    Reaction:  Causes pts sodium/potassium levels to drop significantly   . Metronidazole Other (See Comments)    Reaction:  Unknown   . Propranolol Other (See Comments)    Reaction:  Unknown   . Tavist [Clemastine] Other (See Comments)    Reaction:  Unknown   . Tramadol Other (See Comments)    Reaction:  Unknown   .  Vicodin [Hydrocodone-Acetaminophen] Other (See Comments)    Reaction:  Unknown     Family History  Problem Relation Age of Onset  . Leukemia Mother   . Heart disease Father   . Hypertension      Social History Social History  Substance Use Topics  . Smoking status: Never Smoker  . Smokeless tobacco: Never Used  . Alcohol use No    Review of Systems, per family and patient. Constitutional: Negative for fever. Cardiovascular: Negative for chest pain. Respiratory: positive for shortness of breath with cough. Gastrointestinal: Negative for abdominal pain, vomiting and diarrhea. Neurological: Negative for headache 10-point ROS otherwise negative.  ____________________________________________   PHYSICAL EXAM:  VITAL SIGNS: ED Triage Vitals  Enc Vitals Group     BP --      Pulse Rate 10/08/16 2014 98     Resp 10/08/16 2014 (!) 30     Temp 10/08/16 2014 99.3 F (37.4 C)     Temp Source 10/08/16 2014 Oral     SpO2 10/08/16 2014 97 %     Weight 10/08/16 2017 119 lb 3.2 oz (54.1 kg)     Height 10/08/16 2017 4\' 9"  (1.448 m)     Head Circumference --      Peak Flow --      Pain Score --      Pain Loc --      Pain Edu? --      Excl. in GC? --     Constitutional: Alert and oriented. Well  appearing and in no distress. Eyes: Normal exam ENT   Head: Normocephalic and atraumatic   Mouth/Throat: Mucous membranes are moist. Cardiovascular: irregular rhythm, rate around 90 bpm Respiratory: moderate tachypnea. Patient has left-sided rhonchi with mild right-sided rhonchi. Gastrointestinal: Soft and nontender. No distention. Musculoskeletal: Nontender with normal range of motion in all extremities. No lower extremity tenderness or edema. Neurologic:  Normal speech and language. No gross focal neurologic deficits  Skin:  Skin is warm, dry and intact.  Psychiatric: Mood and affect are normal.   ____________________________________________    EKG  EKG reviewed and interpreted by myself shows atrial fibrillation 111 bpm, narrow QRS, normal axis, noST elevation noted. Nonspecific ST changes.  ____________________________________________    RADIOLOGY  chest x-ray consistent with left airspace disease/opacification concerning for pneumonia  ____________________________________________   INITIAL IMPRESSION / ASSESSMENT AND PLAN / ED COURSE  Pertinent labs & imaging results that were available during my care of the patient were reviewed by me and considered in my medical decision making (see chart for details).  patient presents to the emergency department with shortness of breath nausea, vomiting, and generalized fatigue. On exam the patient has left greater than right rhonchi, she is quite somnolent. Patient is tachypnea borderline temperature of 99.3. Patient currently satting 97% on 3 L normally wears 2 L at home. X-ray consistent with left-sided pneumonia. We will check blood cultures and start the patient on IV antibiotics.  His labs have resulted showing a white blood cell count of 19,000 and again consistent with pneumonia. Patient's troponin is elevated 0.14 however this is likely due to the patient's mild tachycardia as well as pneumonia. We'll continue with IV  antibiotics. Patient will be admitted to the hospital for further treatment.  ____________________________________________   FINAL CLINICAL IMPRESSION(S) / ED DIAGNOSES  pneumonia    Minna Antis, MD 10/08/16 2239

## 2016-10-08 NOTE — H&P (Addendum)
History and Physical   [facility] [place] [facility] Admission History and Physical [employer], D.O.    Patient Name: Alyssa Landry MR#: [id number] Date of Birth: [birth year] Date of Admission: 10/08/2016  Referring MD/NP/PA: Dr. Lenard Lance Primary Care Physician: Rafael Bihari, MD Patient coming from: Home Specialists: Cardio, Urology, Neuro, Nephrology   Chief Complaint:  Chief Complaint  Patient presents with  . Shortness of Breath    HPI: Alyssa Landry is a 83 y.o. female with a known history of afib, anemia, arthritis, asthma, home O2 dependent COPD, HLD, HTN, hypothyroidism, osteoporosis, pulmonary fibrosis, SIADH, TIA presents to the emergency department for evaluation of SOB.  Patient was in a usual state of health until today when she had increased work of breathing, Severe cough which led to nausea and vomiting.  SOB did not respond to nebs.  Visiting Nurse recommended emergency department evaluation.  Patient denies fevers/chills, weakness, dizziness, chest pain, abdominal pain, dysuria/frequency, changes in mental status.   Of note patient was admitted to the hospital on 09/04/2016 for TIA as well as 08/20/2016 for CVA.  Otherwise there has been no change in status. Patient has been taking medication as prescribed and there has been no recent change in medication or diet.  No recent antibiotics.  There has been no recent travel or sick contacts.    EMS/ED Course: Patient received Levaquin.  Review of Systems:  CONSTITUTIONAL: No fever/chills, fatigue, weakness, weight gain/loss, headache. Positive fatigue EYES: No blurry or double vision. ENT: No tinnitus, postnasal drip, redness or soreness of the oropharynx. RESPIRATORY: Positive cough, dyspnea, wheeze.  No hemoptysis.  CARDIOVASCULAR: No chest pain, palpitations, syncope, orthopnea. No lower extremity edema.  GASTROINTESTINAL: Positive nausea, vomiting, negative abdominal pain, diarrhea,  constipation.  No hematemesis, melena or hematochezia. GENITOURINARY: No dysuria, frequency, hematuria. ENDOCRINE: No polyuria or nocturia. No heat or cold intolerance. HEMATOLOGY: No anemia, bruising, bleeding. INTEGUMENTARY: No rashes, ulcers, lesions. MUSCULOSKELETAL: No arthritis, gout, dyspnea. NEUROLOGIC: No numbness, tingling, ataxia, seizure-type activity, weakness. PSYCHIATRIC: No anxiety, depression, insomnia.   Past Medical History:  Diagnosis Date  . A-fib (HCC)   . Acid reflux   . Anemia   . Anxiety   . Arthritis   . Asthma   . Chronic hyponatremia   . COPD (chronic obstructive pulmonary disease) (HCC)   . GERD (gastroesophageal reflux disease)   . Hiatal hernia   . Hiatal hernia   . Hyperlipidemia   . Hypertension   . Hypothyroidism   . Incontinence   . Migraine   . Osteoporosis, post-menopausal   . Persistent cough   . Pulmonary fibrosis (HCC)   . SIADH (syndrome of inappropriate ADH production) (HCC)   . TIA (transient ischemic attack)     Past Surgical History:  Procedure Laterality Date  . ABDOMINAL HYSTERECTOMY  1966     reports that she has never smoked. She has never used smokeless tobacco. She reports that she does not drink alcohol or use drugs.  Allergies  Allergen Reactions  . Aspirin Other (See Comments)    Upset stomach with high doses  . Codeine Other (See Comments)    Reaction:  Unknown   . Ditropan [Oxybutynin] Other (See Comments)    Reaction:  Unknown   . Hctz [Hydrochlorothiazide] Other (See Comments)    Reaction:  Causes pts sodium/potassium levels to drop significantly   . Metronidazole Other (See Comments)    Reaction:  Unknown   . Propranolol Other (See Comments)  Reaction:  Unknown   . Tavist [Clemastine] Other (See Comments)    Reaction:  Unknown   . Tramadol Other (See Comments)    Reaction:  Unknown   . Vicodin [Hydrocodone-Acetaminophen] Other (See Comments)    Reaction:  Unknown     Family History  Problem  Relation Age of Onset  . Leukemia Mother   . Heart disease Father   . Hypertension      Prior to Admission medications   Medication Sig Start Date End Date Taking? Authorizing Provider  acetaminophen (TYLENOL) 650 MG CR tablet Take 650 mg by mouth every 8 (eight) hours as needed for pain.    Historical Provider, MD  albuterol (PROVENTIL HFA;VENTOLIN HFA) 108 (90 BASE) MCG/ACT inhaler Inhale 1-2 puffs into the lungs every 4 (four) hours as needed for wheezing or shortness of breath.    Historical Provider, MD  aspirin EC 81 MG EC tablet Take 1 tablet (81 mg total) by mouth daily. 09/06/16   Milagros LollSrikar Sudini, MD  atorvastatin (LIPITOR) 40 MG tablet Take 1 tablet (40 mg total) by mouth daily at 6 PM. 08/22/16   Enid Baasadhika Kalisetti, MD  cholecalciferol (VITAMIN D) 1000 UNITS tablet Take 1,000 Units by mouth daily.    Historical Provider, MD  cloNIDine (CATAPRES) 0.1 MG tablet Take 0.1 mg by mouth 3 (three) times daily.    Historical Provider, MD  diltiazem (CARDIZEM CD) 120 MG 24 hr capsule Take 1 capsule (120 mg total) by mouth daily. 12/15/15   Wyatt Hasteavid K Hower, MD  docusate sodium (COLACE) 100 MG capsule Take 100 mg by mouth 2 (two) times daily.     Historical Provider, MD  feeding supplement, ENSURE ENLIVE, (ENSURE ENLIVE) LIQD Take 237 mLs by mouth daily.     Historical Provider, MD  ferrous sulfate 325 (65 FE) MG tablet Take 325 mg by mouth daily.     Historical Provider, MD  fluticasone (FLONASE) 50 MCG/ACT nasal spray Place 2 sprays into both nostrils every morning.     Historical Provider, MD  fluticasone (FLOVENT HFA) 110 MCG/ACT inhaler Inhale 2 puffs into the lungs 2 (two) times daily. [id number]    Historical Provider, MD  furosemide (LASIX) 20 MG tablet Take 1 tablet (20 mg total) by mouth daily. Patient taking differently: Take 20 mg by mouth 2 (two) times daily.  01/30/16   Houston SirenVivek J Sainani, MD  guaiFENesin-dextromethorphan (ROBITUSSIN DM) 100-10 MG/5ML syrup Take 10 mLs by mouth every 6 (six)  hours as needed for cough. Patient not taking: Reported on 09/04/2016 05/04/16   Ramonita LabAruna Gouru, MD  levothyroxine (SYNTHROID, LEVOTHROID) 125 MCG tablet Take 125 mcg by mouth daily before breakfast.    Historical Provider, MD  LORazepam (ATIVAN) 0.5 MG tablet Take 1 tablet (0.5 mg total) by mouth 3 (three) times daily. 02/25/16   Houston SirenVivek J Sainani, MD  losartan (COZAAR) 50 MG tablet Take 50 mg by mouth daily.    Historical Provider, MD  multivitamin-lutein (OCUVITE-LUTEIN) CAPS capsule Take 2 capsules by mouth daily.    Historical Provider, MD  Oxybutynin Chloride (GELNIQUE) 10 % GEL Place 1 application onto the skin daily. 05/25/16   Carollee HerterShannon A McGowan, PA-C  polyethylene glycol (MIRALAX / GLYCOLAX) packet Take 8.5-17 g by mouth daily. *Mix in 4-8 ounces of fluid prior to taking*    Historical Provider, MD  ranitidine (ZANTAC) 150 MG tablet Take 150 mg by mouth 2 (two) times daily before a meal.     Historical Provider, MD  sodium bicarbonate 650  MG tablet Take 325 mg by mouth 4 (four) times daily.    Historical Provider, MD  spironolactone (ALDACTONE) 25 MG tablet Take 25 mg by mouth daily.    Historical Provider, MD  tiotropium (SPIRIVA) 18 MCG inhalation capsule Place 18 mcg into inhaler and inhale daily.    Historical Provider, MD  triamcinolone (KENALOG) 0.025 % ointment Apply 1 application topically 2 (two) times daily. 09/22/16   Harle Battiest, PA-C  vitamin B-12 (CYANOCOBALAMIN) 1000 MCG tablet Take 1,000 mcg by mouth daily.    Historical Provider, MD    Physical Exam: Vitals:   10/08/16 2014 10/08/16 2017 10/08/16 2200  BP:   (!) 132/59  Pulse: 98  93  Resp: (!) 30  (!) 24  Temp: 99.3 F (37.4 C)    TempSrc: Oral    SpO2: 97%  95%  Weight:  54.1 kg (119 lb 3.2 oz)   Height:  4\' 9"  (1.448 m)     GENERAL: 83 y.o.-year-old Female patient, well-developed, well-nourished lying in the bed in no acute distress.  Pleasant and cooperative.   HEENT: Head atraumatic, normocephalic. Pupils  equal, round, reactive to light and accommodation. No scleral icterus. Extraocular muscles intact. Nares are patent. Oropharynx is clear. Mucus membranes moist. NECK: Supple, full range of motion. No JVD, no bruit heard. No thyroid enlargement, no tenderness, no cervical lymphadenopathy. CHEST: Bibasilar rhonchi No use of accessory muscles of respiration.  No reproducible chest wall tenderness.  CARDIOVASCULAR: Irregularly irregular S1, S2 normal. No murmurs, rubs, or gallops. Cap refill <2 seconds. Pulses intact distally.  ABDOMEN: Soft, nondistended, nontender. No rebound, guarding, rigidity. Normoactive bowel sounds present in all four quadrants. No organomegaly or mass. EXTREMITIES: No pedal edema, cyanosis, or clubbing. No calf tenderness or Homan's sign.  NEUROLOGIC: The patient is alert and oriented x 3. Cranial nerves II through XII are grossly intact with no focal sensorimotor deficit. Muscle strength 5/5 in all extremities. Sensation intact. Gait not checked. PSYCHIATRIC:  Normal affect, mood, thought content. SKIN: Warm, dry, and intact without obvious rash, lesion, or ulcer.    Labs on Admission:  CBC:  Recent Labs Lab 10/08/16 2106  WBC 19.2*  NEUTROABS 17.2*  HGB 10.6*  HCT 31.5*  MCV 86.3  PLT 275   Basic Metabolic Panel:  Recent Labs Lab 10/08/16 2106  NA 131*  K 4.9  CL 93*  CO2 32  GLUCOSE 120*  BUN 28*  CREATININE 0.64  CALCIUM 9.2   GFR: Estimated Creatinine Clearance: 29.1 mL/min (by C-G formula based on SCr of 0.64 mg/dL). Liver Function Tests:  Recent Labs Lab 10/08/16 2106  AST 20  ALT 18  ALKPHOS 86  BILITOT 0.7  PROT 7.2  ALBUMIN 4.1   No results for input(s): LIPASE, AMYLASE in the last 168 hours. No results for input(s): AMMONIA in the last 168 hours. Coagulation Profile: No results for input(s): INR, PROTIME in the last 168 hours. Cardiac Enzymes:  Recent Labs Lab 10/08/16 2106  TROPONINI 0.14*   BNP (last 3 results) No  results for input(s): PROBNP in the last 8760 hours. HbA1C: No results for input(s): HGBA1C in the last 72 hours. CBG: No results for input(s): GLUCAP in the last 168 hours. Lipid Profile: No results for input(s): CHOL, HDL, LDLCALC, TRIG, CHOLHDL, LDLDIRECT in the last 72 hours. Thyroid Function Tests: No results for input(s): TSH, T4TOTAL, FREET4, T3FREE, THYROIDAB in the last 72 hours. Anemia Panel: No results for input(s): VITAMINB12, FOLATE, FERRITIN, TIBC, IRON, RETICCTPCT in  the last 72 hours. Urine analysis:    Component Value Date/Time   COLORURINE STRAW (A) 09/04/2016 1633   APPEARANCEUR CLEAR (A) 09/04/2016 1633   APPEARANCEUR Cloudy 08/11/2012 1854   LABSPEC 1.009 09/04/2016 1633   LABSPEC 1.012 08/11/2012 1854   PHURINE 7.0 09/04/2016 1633   GLUCOSEU NEGATIVE 09/04/2016 1633   GLUCOSEU Negative 08/11/2012 1854   HGBUR NEGATIVE 09/04/2016 1633   BILIRUBINUR NEGATIVE 09/04/2016 1633   BILIRUBINUR Negative 08/11/2012 1854   KETONESUR NEGATIVE 09/04/2016 1633   PROTEINUR NEGATIVE 09/04/2016 1633   NITRITE NEGATIVE 09/04/2016 1633   LEUKOCYTESUR NEGATIVE 09/04/2016 1633   LEUKOCYTESUR 2+ 08/11/2012 1854   Sepsis Labs: @LABRCNTIP (procalcitonin:4,lacticidven:4) )No results found for this or any previous visit (from the past 240 hour(s)).   Radiological Exams on Admission: Dg Chest 2 View  Result Date: 10/08/2016 CLINICAL DATA:  Acute onset of shortness of breath and generalized weakness. Initial encounter. EXAM: CHEST  2 VIEW COMPARISON:  Chest radiograph performed 09/04/2016 FINDINGS: The lungs are well-aerated. A small left pleural effusion is noted. Left basilar airspace opacification raises concern for pneumonia. There is no evidence of pneumothorax. The heart is mildly enlarged. Peribronchial thickening is noted. No acute osseous abnormalities are seen. IMPRESSION: 1. Small left pleural effusion. Left basilar airspace opacification raises concern for pneumonia. 2.  Mild cardiomegaly. 3. Peribronchial thickening noted. Electronically Signed   By: Roanna Raider M.D.   On: 10/08/2016 20:48    EKG: Atrial fibrillation at 111 bpm with normal axis and nonspecific ST-T wave changes.   Assessment/Plan  This is a 83 y.o. female with a history of afib, anemia, arthritis, asthma, home O2 dependent COPD, HLD, HTN, hypothyroidism, osteoporosis, pulmonary fibrosis, SIADH, TIA  now being admitted with:  #. Healthcare Associated Pneumonia in a patient with known asthma, home O2 dependent COPD and pulmonary fibrosis - Admit to inpatient - IV Cefepime & Vancomycin - Duonebs, expectorants & O2 therapy as needed - Aspiration precautions - Follow up blood & sputum cultures - Consider pulmonary consult if not improving  #. Elevated troponin, likely demand ischemia -Monitor on telemetry -Trend troponins - Continue aspirin  #. Hyponatremia, mild, chronic -At home patient is fluid restricted to 32 ounces of fluids by mouth per day per neuro.  #. History of CVA - Continue aspirin  #. History of hyperlipidemia - Continue Lipitor  #. History of hypertension - Continue spironolactone, Catapres, Lasix, Cozaar  #. History of hypothyroid - Continue Synthroid  #. History of atrial fibrillation - Continue Cardizem  #History of iron deficiency anemia, chronic and stable -Continue iron  #. History of asthma, COPD -Continue Flonase, Flovent, Spiriva  #. History of GERD -Continue Pepcid  Admission status: Inpatient IV Fluids: Normal saline Diet/Nutrition: Heart healthy Consults called: None  DVT Px:  SCDs and early ambulation.  No chemoprophylaxis due  To history of brain bleed.   Code Status: DNR, DNI Disposition Plan: To home in 1-2 days  All the records are reviewed and case discussed with ED provider. Management plans discussed with the patient and/or family who express understanding and agree with plan of care.  Edrik Rundle D.O. on 10/08/2016  at 10:30 PM Between 7am to 6pm - Pager - [phone] After 6pm go to [email or web address] - Social research officer, government [facility] [place] [facility] Office [phone] CC: Primary care physician; Rafael Bihari, MD   10/08/2016, 10:30 PM

## 2016-10-08 NOTE — ED Triage Notes (Addendum)
Per EMS report, patient's daughter states patient has had increased shortness of breath today. Patient c/o weakness. Patient states shortness increased after rescue inhaler treatment at home. Patient c/o nausea in the ambulance and was given 4mg  Zofran IV.

## 2016-10-08 NOTE — ED Notes (Signed)
Patient transported to imaging.

## 2016-10-09 LAB — CBC
HCT: 30.3 % — ABNORMAL LOW (ref 35.0–47.0)
HEMOGLOBIN: 10.2 g/dL — AB (ref 12.0–16.0)
MCH: 29.1 pg (ref 26.0–34.0)
MCHC: 33.6 g/dL (ref 32.0–36.0)
MCV: 86.7 fL (ref 80.0–100.0)
Platelets: 260 10*3/uL (ref 150–440)
RBC: 3.5 MIL/uL — AB (ref 3.80–5.20)
RDW: 13.9 % (ref 11.5–14.5)
WBC: 24.2 10*3/uL — ABNORMAL HIGH (ref 3.6–11.0)

## 2016-10-09 LAB — TROPONIN I
TROPONIN I: 0.28 ng/mL — AB (ref <0.03)
TROPONIN I: 0.42 ng/mL — AB (ref <0.03)
Troponin I: 0.61 ng/mL (ref <0.03)
Troponin I: 0.7 ng/mL (ref <0.03)

## 2016-10-09 LAB — BASIC METABOLIC PANEL
Anion gap: 6 (ref 5–15)
BUN: 29 mg/dL — ABNORMAL HIGH (ref 6–20)
CALCIUM: 9.1 mg/dL (ref 8.9–10.3)
CO2: 31 mmol/L (ref 22–32)
CREATININE: 0.83 mg/dL (ref 0.44–1.00)
Chloride: 92 mmol/L — ABNORMAL LOW (ref 101–111)
GFR, EST NON AFRICAN AMERICAN: 58 mL/min — AB (ref >60)
Glucose, Bld: 168 mg/dL — ABNORMAL HIGH (ref 65–99)
Potassium: 4.5 mmol/L (ref 3.5–5.1)
SODIUM: 129 mmol/L — AB (ref 135–145)

## 2016-10-09 LAB — URINALYSIS, ROUTINE W REFLEX MICROSCOPIC
BILIRUBIN URINE: NEGATIVE
Glucose, UA: NEGATIVE mg/dL
HGB URINE DIPSTICK: NEGATIVE
KETONES UR: NEGATIVE mg/dL
Leukocytes, UA: NEGATIVE
Nitrite: NEGATIVE
PROTEIN: NEGATIVE mg/dL
Specific Gravity, Urine: 1.02 (ref 1.005–1.030)
pH: 6 (ref 5.0–8.0)

## 2016-10-09 LAB — MRSA PCR SCREENING: MRSA BY PCR: POSITIVE — AB

## 2016-10-09 LAB — INFLUENZA PANEL BY PCR (TYPE A & B)
INFLAPCR: NEGATIVE
INFLBPCR: NEGATIVE

## 2016-10-09 MED ORDER — METHYLPREDNISOLONE SODIUM SUCC 40 MG IJ SOLR
40.0000 mg | Freq: Two times a day (BID) | INTRAMUSCULAR | Status: DC
  Administered 2016-10-09 – 2016-10-12 (×6): 40 mg via INTRAVENOUS
  Filled 2016-10-09 (×6): qty 1

## 2016-10-09 MED ORDER — DEXTROMETHORPHAN POLISTIREX ER 30 MG/5ML PO SUER
30.0000 mg | Freq: Two times a day (BID) | ORAL | Status: DC
  Administered 2016-10-09 – 2016-10-12 (×8): 30 mg via ORAL
  Filled 2016-10-09 (×11): qty 5

## 2016-10-09 MED ORDER — SODIUM BICARBONATE 650 MG PO TABS
325.0000 mg | ORAL_TABLET | Freq: Four times a day (QID) | ORAL | Status: DC
  Administered 2016-10-09 – 2016-10-12 (×14): 325 mg via ORAL
  Filled 2016-10-09 (×15): qty 1

## 2016-10-09 MED ORDER — VITAMIN B-12 1000 MCG PO TABS
1000.0000 ug | ORAL_TABLET | Freq: Every day | ORAL | Status: DC
  Administered 2016-10-09 – 2016-10-12 (×4): 1000 ug via ORAL
  Filled 2016-10-09 (×4): qty 1

## 2016-10-09 MED ORDER — ONDANSETRON HCL 4 MG PO TABS: 4.0000 mg | ORAL_TABLET | Freq: Four times a day (QID) | ORAL | Status: DC | PRN

## 2016-10-09 MED ORDER — CLONIDINE HCL 0.1 MG PO TABS
0.1000 mg | ORAL_TABLET | Freq: Two times a day (BID) | ORAL | Status: DC
  Administered 2016-10-09 – 2016-10-11 (×4): 0.1 mg via ORAL
  Filled 2016-10-09 (×4): qty 1

## 2016-10-09 MED ORDER — FLUTICASONE PROPIONATE 50 MCG/ACT NA SUSP
2.0000 | NASAL | Status: DC
  Administered 2016-10-09 – 2016-10-12 (×4): 2 via NASAL
  Filled 2016-10-09: qty 16

## 2016-10-09 MED ORDER — BUDESONIDE 0.5 MG/2ML IN SUSP
0.5000 mg | Freq: Two times a day (BID) | RESPIRATORY_TRACT | Status: DC
  Administered 2016-10-09 – 2016-10-12 (×6): 0.5 mg via RESPIRATORY_TRACT
  Filled 2016-10-09 (×6): qty 2

## 2016-10-09 MED ORDER — ACETAMINOPHEN 325 MG PO TABS: 650.0000 mg | ORAL_TABLET | Freq: Four times a day (QID) | ORAL | Status: DC | PRN

## 2016-10-09 MED ORDER — OXYBUTYNIN CHLORIDE 10 % TD GEL: 1.0000 | Freq: Every day | TRANSDERMAL | Status: DC

## 2016-10-09 MED ORDER — SODIUM CHLORIDE 0.9 % IV SOLN
INTRAVENOUS | Status: DC
  Administered 2016-10-09 – 2016-10-10 (×4): via INTRAVENOUS

## 2016-10-09 MED ORDER — CEFEPIME-DEXTROSE 1 GM/50ML IV SOLR
1.0000 g | Freq: Three times a day (TID) | INTRAVENOUS | Status: DC
  Administered 2016-10-09 (×2): 1 g via INTRAVENOUS
  Filled 2016-10-09 (×4): qty 50

## 2016-10-09 MED ORDER — ATORVASTATIN CALCIUM 20 MG PO TABS
40.0000 mg | ORAL_TABLET | Freq: Every day | ORAL | Status: DC
  Administered 2016-10-09 – 2016-10-11 (×3): 40 mg via ORAL
  Filled 2016-10-09 (×4): qty 2

## 2016-10-09 MED ORDER — ALBUTEROL SULFATE (2.5 MG/3ML) 0.083% IN NEBU
2.5000 mg | INHALATION_SOLUTION | Freq: Four times a day (QID) | RESPIRATORY_TRACT | Status: DC
  Administered 2016-10-09: 2.5 mg via RESPIRATORY_TRACT
  Filled 2016-10-09: qty 3

## 2016-10-09 MED ORDER — BENZONATATE 100 MG PO CAPS: 100.0000 mg | ORAL_CAPSULE | Freq: Three times a day (TID) | ORAL | Status: DC | PRN

## 2016-10-09 MED ORDER — DOCUSATE SODIUM 100 MG PO CAPS
100.0000 mg | ORAL_CAPSULE | Freq: Two times a day (BID) | ORAL | Status: DC
Start: 2016-10-09 — End: 2016-10-12
  Administered 2016-10-09 – 2016-10-12 (×8): 100 mg via ORAL
  Filled 2016-10-09 (×8): qty 1

## 2016-10-09 MED ORDER — ENSURE ENLIVE PO LIQD
237.0000 mL | Freq: Every day | ORAL | Status: DC
  Administered 2016-10-09 – 2016-10-10 (×2): 237 mL via ORAL

## 2016-10-09 MED ORDER — VANCOMYCIN HCL IN DEXTROSE 750-5 MG/150ML-% IV SOLN
750.0000 mg | INTRAVENOUS | Status: DC
  Administered 2016-10-10 – 2016-10-11 (×2): 750 mg via INTRAVENOUS
  Filled 2016-10-09 (×3): qty 150

## 2016-10-09 MED ORDER — BUDESONIDE 0.25 MG/2ML IN SUSP: 0.2500 mg | Freq: Two times a day (BID) | RESPIRATORY_TRACT | Status: DC

## 2016-10-09 MED ORDER — ENOXAPARIN SODIUM 30 MG/0.3ML ~~LOC~~ SOLN
30.0000 mg | SUBCUTANEOUS | Status: DC
  Administered 2016-10-09 – 2016-10-10 (×2): 30 mg via SUBCUTANEOUS
  Filled 2016-10-09 (×3): qty 0.3

## 2016-10-09 MED ORDER — TIOTROPIUM BROMIDE MONOHYDRATE 18 MCG IN CAPS
18.0000 ug | ORAL_CAPSULE | Freq: Every day | RESPIRATORY_TRACT | Status: DC
  Administered 2016-10-09 – 2016-10-11 (×3): 18 ug via RESPIRATORY_TRACT
  Filled 2016-10-09: qty 5

## 2016-10-09 MED ORDER — IPRATROPIUM BROMIDE 0.02 % IN SOLN
0.5000 mg | Freq: Four times a day (QID) | RESPIRATORY_TRACT | Status: DC | PRN
  Administered 2016-10-11: 0.5 mg via RESPIRATORY_TRACT
  Filled 2016-10-09: qty 2.5

## 2016-10-09 MED ORDER — MAGNESIUM CITRATE PO SOLN
1.0000 | Freq: Once | ORAL | Status: DC | PRN
  Filled 2016-10-09: qty 296

## 2016-10-09 MED ORDER — LORAZEPAM 0.5 MG PO TABS
0.5000 mg | ORAL_TABLET | Freq: Three times a day (TID) | ORAL | Status: DC
  Administered 2016-10-09 – 2016-10-12 (×11): 0.5 mg via ORAL
  Filled 2016-10-09 (×11): qty 1

## 2016-10-09 MED ORDER — FLUTICASONE PROPIONATE HFA 110 MCG/ACT IN AERO: 2.0000 | INHALATION_SPRAY | Freq: Two times a day (BID) | RESPIRATORY_TRACT | Status: DC

## 2016-10-09 MED ORDER — ONDANSETRON HCL 4 MG/2ML IJ SOLN
4.0000 mg | Freq: Four times a day (QID) | INTRAMUSCULAR | Status: DC | PRN
  Administered 2016-10-09: 4 mg via INTRAVENOUS
  Filled 2016-10-09: qty 2

## 2016-10-09 MED ORDER — ACETAMINOPHEN 650 MG RE SUPP: 650.0000 mg | Freq: Four times a day (QID) | RECTAL | Status: DC | PRN

## 2016-10-09 MED ORDER — GUAIFENESIN ER 600 MG PO TB12
600.0000 mg | ORAL_TABLET | Freq: Two times a day (BID) | ORAL | Status: DC
  Administered 2016-10-09 – 2016-10-12 (×8): 600 mg via ORAL
  Filled 2016-10-09 (×9): qty 1

## 2016-10-09 MED ORDER — OCUVITE-LUTEIN PO CAPS
2.0000 | ORAL_CAPSULE | Freq: Every day | ORAL | Status: DC
  Administered 2016-10-09 – 2016-10-12 (×4): 2 via ORAL
  Filled 2016-10-09 (×4): qty 2

## 2016-10-09 MED ORDER — VANCOMYCIN HCL IN DEXTROSE 750-5 MG/150ML-% IV SOLN
750.0000 mg | INTRAVENOUS | Status: DC
  Filled 2016-10-09: qty 150

## 2016-10-09 MED ORDER — SPIRONOLACTONE 25 MG PO TABS
25.0000 mg | ORAL_TABLET | Freq: Every day | ORAL | Status: DC
  Administered 2016-10-09 – 2016-10-12 (×4): 25 mg via ORAL
  Filled 2016-10-09 (×4): qty 1

## 2016-10-09 MED ORDER — FUROSEMIDE 40 MG PO TABS
20.0000 mg | ORAL_TABLET | Freq: Two times a day (BID) | ORAL | Status: DC
  Administered 2016-10-09: 20 mg via ORAL
  Filled 2016-10-09: qty 1

## 2016-10-09 MED ORDER — VITAMIN D 1000 UNITS PO TABS
1000.0000 [IU] | ORAL_TABLET | Freq: Every day | ORAL | Status: DC
  Administered 2016-10-09 – 2016-10-12 (×4): 1000 [IU] via ORAL
  Filled 2016-10-09 (×4): qty 1

## 2016-10-09 MED ORDER — SENNOSIDES-DOCUSATE SODIUM 8.6-50 MG PO TABS: 1.0000 | ORAL_TABLET | Freq: Every evening | ORAL | Status: DC | PRN

## 2016-10-09 MED ORDER — DILTIAZEM HCL ER COATED BEADS 120 MG PO CP24: 120.0000 mg | ORAL_CAPSULE | Freq: Every day | ORAL | Status: DC

## 2016-10-09 MED ORDER — CEFEPIME HCL 2 G IJ SOLR
2.0000 g | INTRAMUSCULAR | Status: DC
  Administered 2016-10-10 – 2016-10-12 (×3): 2 g via INTRAVENOUS
  Filled 2016-10-09 (×4): qty 2

## 2016-10-09 MED ORDER — BUDESONIDE 0.25 MG/2ML IN SUSP
0.2500 mg | Freq: Two times a day (BID) | RESPIRATORY_TRACT | Status: DC
  Administered 2016-10-09: 0.25 mg via RESPIRATORY_TRACT
  Filled 2016-10-09: qty 2

## 2016-10-09 MED ORDER — BISACODYL 5 MG PO TBEC: 5.0000 mg | DELAYED_RELEASE_TABLET | Freq: Every day | ORAL | Status: DC | PRN

## 2016-10-09 MED ORDER — SODIUM CHLORIDE 0.9% FLUSH
3.0000 mL | Freq: Two times a day (BID) | INTRAVENOUS | Status: DC
  Administered 2016-10-09 – 2016-10-12 (×8): 3 mL via INTRAVENOUS

## 2016-10-09 MED ORDER — DM-GUAIFENESIN ER 30-600 MG PO TB12: 1.0000 | ORAL_TABLET | Freq: Two times a day (BID) | ORAL | Status: DC

## 2016-10-09 MED ORDER — POLYETHYLENE GLYCOL 3350 17 G PO PACK
8.5000 g | PACK | Freq: Every day | ORAL | Status: DC
  Administered 2016-10-09 – 2016-10-12 (×4): 17 g via ORAL
  Filled 2016-10-09 (×4): qty 1

## 2016-10-09 MED ORDER — ASPIRIN EC 81 MG PO TBEC
81.0000 mg | DELAYED_RELEASE_TABLET | Freq: Every day | ORAL | Status: DC
  Administered 2016-10-09 – 2016-10-12 (×4): 81 mg via ORAL
  Filled 2016-10-09 (×4): qty 1

## 2016-10-09 MED ORDER — ALBUTEROL SULFATE (2.5 MG/3ML) 0.083% IN NEBU
2.5000 mg | INHALATION_SOLUTION | Freq: Three times a day (TID) | RESPIRATORY_TRACT | Status: DC
  Administered 2016-10-10: 2.5 mg via RESPIRATORY_TRACT
  Filled 2016-10-09: qty 3

## 2016-10-09 MED ORDER — PROMETHAZINE HCL 25 MG/ML IJ SOLN: 6.2500 mg | Freq: Four times a day (QID) | INTRAMUSCULAR | Status: DC | PRN

## 2016-10-09 MED ORDER — LOSARTAN POTASSIUM 50 MG PO TABS
50.0000 mg | ORAL_TABLET | Freq: Every day | ORAL | Status: DC
  Filled 2016-10-09: qty 1

## 2016-10-09 MED ORDER — LEVOTHYROXINE SODIUM 50 MCG PO TABS
125.0000 ug | ORAL_TABLET | Freq: Every day | ORAL | Status: DC
  Administered 2016-10-09 – 2016-10-12 (×4): 125 ug via ORAL
  Filled 2016-10-09 (×2): qty 3
  Filled 2016-10-09: qty 1
  Filled 2016-10-09: qty 3

## 2016-10-09 MED ORDER — FERROUS SULFATE 325 (65 FE) MG PO TABS
325.0000 mg | ORAL_TABLET | Freq: Every day | ORAL | Status: DC
Start: 2016-10-09 — End: 2016-10-12
  Administered 2016-10-09 – 2016-10-12 (×4): 325 mg via ORAL
  Filled 2016-10-09 (×4): qty 1

## 2016-10-09 MED ORDER — FAMOTIDINE 20 MG PO TABS
20.0000 mg | ORAL_TABLET | Freq: Every day | ORAL | Status: DC
  Administered 2016-10-09 – 2016-10-12 (×4): 20 mg via ORAL
  Filled 2016-10-09 (×4): qty 1

## 2016-10-09 MED ORDER — VANCOMYCIN HCL IN DEXTROSE 1-5 GM/200ML-% IV SOLN
1000.0000 mg | Freq: Once | INTRAVENOUS | Status: AC
  Administered 2016-10-09: 1000 mg via INTRAVENOUS
  Filled 2016-10-09: qty 200

## 2016-10-09 MED ORDER — CLONIDINE HCL 0.1 MG PO TABS: 0.1000 mg | ORAL_TABLET | Freq: Three times a day (TID) | ORAL | Status: DC

## 2016-10-09 NOTE — Care Management (Signed)
Patient is currently followed by [facility] for SN PT and possibly aide.  Has chronic home 02 through [facility]. Per Marcelino Duster with [facility], patient has appeared to be on a decline over the last few weeks.  wonder if may benefit from outpatient palliative to follow as an outpatient.  Has had a skilled nursing stay at [facility]. PT consult is pending

## 2016-10-09 NOTE — Evaluation (Signed)
Physical Therapy Evaluation Patient Details Name: Alyssa Landry MRN: [id number] DOB: [birth year] Today's Date: 10/09/2016   History of Present Illness  pt was admitted on 10/05/2016 with CC of SOB and PMHx of AFIB, COPD, asthma, and CVA which she was seen most recently on 09/04/2016. She reports living with her daughter. She currently reports using O2 at home all the time, as well used a RW and a stair lift.   Clinical Impression  Mrs Creely is a pleasant 83 y.o F with CC of SOB and dx of Healthcare-associated Pneumonia. Upon Arrival she was lying in bed with 2 LPM O2. Pt was alert and oriented x 3 (person, place and situation). She demonstrates functional strength of Bil LE with weakness noted with UE shoulder/elbow extension. She reported feeling nauseas but was able to sit EOB where O2 remained 94 but HR elevated to 124 BPM. Following deep breathing HR dropped to 90 BPM and she was able to stand utilizing the bed for support and posterior trunk lean. She was able to stand for ~2 min before sitting down. Used RW to transfer from bed to chair ~ 2-3 ft where she was able to perform seated exercise well with HR and O2 staying within normal ranges. She would benefit from physical therapy to improve strength/ endurance to return to PLOF. She is being followed by [facility] and PT plan is to recommend is to be transferred to SNF upon discharge. Pt was left in chair at the end of the session and nursing was notified.    Follow Up Recommendations SNF    Equipment Recommendations  None recommended by PT    Recommendations for Other Services       Precautions / Restrictions Precautions Precautions: Fall Restrictions Weight Bearing Restrictions: No      Mobility  Bed Mobility Overal bed mobility: Needs Assistance Bed Mobility: Supine to Sit;Sit to Supine     Supine to sit: Min assist (verbal cues for scooting forward on EOB) Sit to supine:  (pt was left in recliner)       Transfers Overall transfer level: Needs assistance Equipment used: Rolling walker (2 wheeled)             General transfer comment: limited balance with standing using LE against the bed for balance exhibiting posterior trunk lean. required HHA initally for ~2 min. Second sit<>stand transfer used with RW (verbal cues need with pushing upfrom bed and leaning forward)  Ambulation/Gait Ambulation/Gait assistance: Min assist (with RW) Ambulation Distance (Feet): 3 Feet Assistive device: Rolling walker (2 wheeled)       General Gait Details: verbal cues needed for pushing stepping and pushing the RW, pt demonstrated shuffling pattern and verbal cues for turning and feeling chair on back of legs before sitting down  Stairs            Wheelchair Mobility    Modified Rankin (Stroke Patients Only)       Balance Overall balance assessment: Needs assistance Sitting-balance support: Feet supported;Bilateral upper extremity supported       Standing balance support:  (utilized legs agains the bed for balance)   Standing balance comment: intially requiring HHA, then second rep using RW                              Pertinent Vitals/Pain Pain Assessment: No/denies pain (reported feeling nauseas)    Home Living Family/patient expects to be discharged to:: Private residence  Living Arrangements: Children Available Help at Discharge: Family Type of Home: House Home Access: Stairs to enter;Other (comment) Entrance Stairs-Rails: None Entrance Stairs-Number of Steps: 10   Home Equipment: Other (comment) (chair lift)      Prior Function Level of Independence: Independent with assistive device(s) (RW)               Hand Dominance        Extremity/Trunk Assessment   Upper Extremity Assessment Upper Extremity Assessment: Generalized weakness (significant weakness with shoulder/ elbow extension 3+/5)    Lower Extremity Assessment Lower Extremity  Assessment: Generalized weakness (4-/5)       Communication   Communication: HOH;No difficulties  Cognition Arousal/Alertness: Awake/alert (to person/ place/ situation)                                            General Comments      Exercises Other Exercises Other Exercises: ankle DF/PF 1x 10 bil, knee extension bil 1x 10 ea, alt hip flexion 1 x 10 bil. 1 x 10 shoulder flexion, 1 x 10 elbow extension (against manual resistance) (required verbal /demonstration cues for exercises)   Assessment/Plan    PT Assessment Patient needs continued PT services  PT Problem List Decreased strength;Decreased balance;Decreased mobility;Decreased activity tolerance       PT Treatment Interventions Therapeutic exercise;Gait training;DME instruction;Stair training;Patient/family education    PT Goals (Current goals can be found in the Care Plan section)  Acute Rehab PT Goals Patient Stated Goal: to breath easier PT Goal Formulation: With patient Time For Goal Achievement: 10/23/16 Potential to Achieve Goals: Good    Frequency Min 2X/week   Barriers to discharge        Co-evaluation               End of Session Equipment Utilized During Treatment: Gait belt;Oxygen Activity Tolerance: Patient limited by fatigue;Other (comment) (feeling neauseas ) Patient left: in chair;with chair alarm set;with call bell/phone within reach Nurse Communication: Mobility status;Other (comment) (HR/ O2 during evaluation) PT Visit Diagnosis: Other abnormalities of gait and mobility (R26.89);Unsteadiness on feet (R26.81)    Time: 4098-1191 PT Time Calculation (min) (ACUTE ONLY): 30 min   Charges:   PT Evaluation $PT Eval Moderate Complexity: 1 Procedure PT Treatments $Therapeutic Exercise: 8-22 mins   PT G Codes:        Zoya Sprecher PT, DPT, LAT, ATC  10/09/16  2:50 PM       Irina Okelly 10/09/2016, 2:50 PM

## 2016-10-09 NOTE — Care Management Important Message (Signed)
Important Message  Patient Details  Name: Alyssa Landry MRN: [id number] Date of Birth: 03-08-1919   Medicare Important Message Given:  Yes Signed copy given   Eber Hong, RN 10/09/2016, 9:29 AM

## 2016-10-09 NOTE — Plan of Care (Signed)
Problem: Activity: Goal: Risk for activity intolerance will decrease Outcome: Progressing Patient assisted to [facility], tolerated fairly well. Complained of shortness of breath and nausea with exertion, resolved with rest and deep breathing exercises.

## 2016-10-09 NOTE — ED Notes (Signed)
Pt states post albuterol treatment she "feels like I can breathe better." Pt's oxygen is 98% on 3L post albuterol treatment.

## 2016-10-09 NOTE — Plan of Care (Signed)
Problem: Physical Regulation: Goal: Will remain free from infection Outcome: Not Progressing ABT in progress for pneumonia

## 2016-10-09 NOTE — Progress Notes (Signed)
[facility] [place] at [facility]   PATIENT NAME: Alyssa Landry    MR#:  [id number]  DATE OF BIRTH:  02/13/1919  SUBJECTIVE:  CHIEF COMPLAINT:   Chief Complaint  Patient presents with  . Shortness of Breath   - feels extremely weak, nausea with no vomitings since morning - remains on o2  REVIEW OF SYSTEMS:  Review of Systems  Constitutional: Positive for chills and malaise/fatigue. Negative for fever.  HENT: Negative for congestion, ear discharge, hearing loss and nosebleeds.   Respiratory: Positive for shortness of breath. Negative for cough and wheezing.   Cardiovascular: Negative for chest pain, palpitations and leg swelling.  Gastrointestinal: Positive for nausea. Negative for abdominal pain, constipation, diarrhea and vomiting.  Genitourinary: Negative for dysuria.  Musculoskeletal: Negative for myalgias.  Neurological: Negative for dizziness, speech change, focal weakness, seizures and headaches.  Psychiatric/Behavioral: Negative for depression. The patient is not nervous/anxious.     DRUG ALLERGIES:   Allergies  Allergen Reactions  . Aspirin Other (See Comments)    Upset stomach with high doses  . Codeine Other (See Comments)    Reaction:  Unknown   . Ditropan [Oxybutynin] Other (See Comments)    Reaction:  Unknown   . Hctz [Hydrochlorothiazide] Other (See Comments)    Reaction:  Causes pts sodium/potassium levels to drop significantly   . Metronidazole Other (See Comments)    Reaction:  Unknown   . Propranolol Other (See Comments)    Reaction:  Unknown   . Tavist [Clemastine] Other (See Comments)    Reaction:  Unknown   . Tramadol Other (See Comments)    Reaction:  Unknown   . Vicodin [Hydrocodone-Acetaminophen] Other (See Comments)    Reaction:  Unknown     VITALS:  Blood pressure (!) 140/52, pulse 83, temperature 99.7 F (37.6 C), temperature source Oral, resp. rate 18, height  (1.448 m), weight 54.1 kg (119 lb 3.2 oz), SpO2  98 %.  PHYSICAL EXAMINATION:  Physical Exam  GENERAL:  83 y.o.-year-old elderly patient lying in the bed with no acute distress.  EYES: Pupils equal, round, reactive to light and accommodation. No scleral icterus. Extraocular muscles intact.  HEENT: Head atraumatic, normocephalic. Oropharynx and nasopharynx clear.  NECK:  Supple, no jugular venous distention. No thyroid enlargement, no tenderness.  LUNGS: Normal breath sounds bilaterally, bibasilar crackles,  no wheezing, rhonchi. No use of accessory muscles of respiration.  CARDIOVASCULAR: S1, S2 normal. No  rubs, or gallops. 3/6 systolic murmur ABDOMEN: Soft, nontender, nondistended. Bowel sounds present. No organomegaly or mass.  EXTREMITIES: No pedal edema, cyanosis, or clubbing.  NEUROLOGIC: Cranial nerves II through XII are intact. Muscle strength 5/5 in all extremities. Sensation intact. Gait not checked. Global weakness noted. PSYCHIATRIC: The patient is alert and oriented x 3.  SKIN: No obvious rash, lesion, or ulcer.    LABORATORY PANEL:   CBC  Recent Labs Lab 10/09/16 0257  WBC 24.2*  HGB 10.2*  HCT 30.3*  PLT 260   ------------------------------------------------------------------------------------------------------------------  Chemistries   Recent Labs Lab 10/08/16 2106 10/09/16 0257  NA 131* 129*  K 4.9 4.5  CL 93* 92*  CO2 32 31  GLUCOSE 120* 168*  BUN 28* 29*  CREATININE 0.64 0.83  CALCIUM 9.2 9.1  AST 20  --   ALT 18  --   ALKPHOS 86  --   BILITOT 0.7  --    ------------------------------------------------------------------------------------------------------------------  Cardiac Enzymes  Recent Labs Lab 10/09/16 0840  TROPONINI <0.03   ------------------------------------------------------------------------------------------------------------------  RADIOLOGY:  Dg Chest 2 View  Result Date: 10/08/2016 CLINICAL DATA:  Acute onset of shortness of breath and generalized weakness.  Initial encounter. EXAM: CHEST  2 VIEW COMPARISON:  Chest radiograph performed 09/04/2016 FINDINGS: The lungs are well-aerated. A small left pleural effusion is noted. Left basilar airspace opacification raises concern for pneumonia. There is no evidence of pneumothorax. The heart is mildly enlarged. Peribronchial thickening is noted. No acute osseous abnormalities are seen. IMPRESSION: 1. Small left pleural effusion. Left basilar airspace opacification raises concern for pneumonia. 2. Mild cardiomegaly. 3. Peribronchial thickening noted. Electronically Signed   By: Roanna Raider M.D.   On: 10/08/2016 20:48    EKG:   Orders placed or performed during the hospital encounter of 10/08/16  . ED EKG  . ED EKG  . EKG 12-Lead    ASSESSMENT AND PLAN:   83y/o F with PMH of Afib not on anticoagulation due to hemorrhagic CVA last month, anenmia, arthritis, SIADH with chronic hyponatremia, pulmonary fibrosis on chronic home o2 presents to the hospital secondary to sepsis.  #1 Sepsis- secondary to HCAP due to recent hospitalizations - f/u blood cultures, cont o2 support - monitor wbc and fevers - on vancomycin and cefepime- check MRSA PCR  #2 Elevated troponin- demand ischemia, improved now  #3 HTN- with sepsis, BP low this AM  so meds being hold except clonidine  #4 Chronic hyponatremia- with SIADH Monitor closely, lasix on hold, fluid restriction can be added Continue sodium bicarb tabs  #5 Chronic afib- rate controlled, cardizem on hold today due to hypotension Monitor closely, on asa for anticoagulation  #6 H/o hemorrhagic CVA recently- right eye peripheral vision loss On asa or now, also statin  #7  DVT Prophylaxis- on lovenox  Physical Therapy consulted.     All the records are reviewed and case discussed with Care Management/Social Workerr. Management plans discussed with the patient, family and they are in agreement.  CODE STATUS: DNR  TOTAL TIME TAKING CARE OF THIS  PATIENT: 41 minutes.   POSSIBLE D/C IN 2-3 DAYS, DEPENDING ON CLINICAL CONDITION.   Anyah Swallow M.D on 10/09/2016 at 3:03 PM  Between 7am to 6pm - Pager - [phone]  After 6pm go to [email or web address] - Social research officer, government  [facility]  Office  [phone]  CC: Primary care physician; Rafael Bihari, MD

## 2016-10-09 NOTE — Progress Notes (Signed)
Pharmacy Antibiotic Note  Alyssa Landry is a 83 y.o. female admitted on 10/08/2016 with pneumonia.  Pharmacy has been consulted for Vancomycin dosing.  Plan: Patient admitted to be in sepsis will load w/ vancomycin 1g IV x 1 (20 mg/kg load). Will initiate vancomycin 750 mg q24h starting 3/31 @ 0200. Ke 0.0285 T1/2 24 hours CrCl 29.1 ml/min Scr 0.69 (baseline) Will check VT 4/2 @ 0100 prior to 4th dose.  Height:  (144.8 cm) Weight: 119 lb 3.2 oz (54.1 kg) IBW/kg (Calculated) : 38.6  Temp (24hrs), Avg:98.8 F (37.1 C), Min:98.2 F (36.8 C), Max:99.3 F (37.4 C)   Recent Labs Lab 10/08/16 2106  WBC 19.2*  CREATININE 0.64    Estimated Creatinine Clearance: 29.1 mL/min (by C-G formula based on SCr of 0.64 mg/dL).    Allergies  Allergen Reactions  . Aspirin Other (See Comments)    Upset stomach with high doses  . Codeine Other (See Comments)    Reaction:  Unknown   . Ditropan [Oxybutynin] Other (See Comments)    Reaction:  Unknown   . Hctz [Hydrochlorothiazide] Other (See Comments)    Reaction:  Causes pts sodium/potassium levels to drop significantly   . Metronidazole Other (See Comments)    Reaction:  Unknown   . Propranolol Other (See Comments)    Reaction:  Unknown   . Tavist [Clemastine] Other (See Comments)    Reaction:  Unknown   . Tramadol Other (See Comments)    Reaction:  Unknown   . Vicodin [Hydrocodone-Acetaminophen] Other (See Comments)    Reaction:  Unknown     Antimicrobials this admission: 3/30 LVQ >> 3/30 3/30 CFP >>  3/30 Vanc >>  Microbiology results: 3/30 BCx: sent x 2  Thank you for allowing pharmacy to be a part of this patient's care.  Thomasene Ripple, PharmD, BCPS Clinical Pharmacist 10/09/2016

## 2016-10-09 NOTE — Progress Notes (Signed)
Patient complaining of nausea, chest tightness, generally not feeling well. Patient is taking shallow breaths, appears moderately anxious. Dr. Nemiah Commander made aware of patient status, told to give morning meds and continue to monitor.

## 2016-10-09 NOTE — Progress Notes (Signed)
Pharmacy Antibiotic Note  Alyssa Landry is a 83 y.o. female admitted on 10/08/2016 with pneumonia.  Pharmacy has been consulted for Vancomycin dosing.  Plan: Will continue vancomycin 750 mg q24h. Trough ordered prior to 4th dose.    Patient is also receiving cefepime. Per [facility] pharmacy has renally dose adjusted cefepime to 2gm IV every 24 hours based on CrCl of 60ml/min.   MRSA PCR positive  Ke 0.0285 T1/2 24 hours CrCl 29.1 ml/min Scr 0.69 (baseline) Will check VT 4/2 @ 0100 prior to 4th dose.  Height:  (144.8 cm) Weight: 119 lb 3.2 oz (54.1 kg) IBW/kg (Calculated) : 38.6  Temp (24hrs), Avg:98.8 F (37.1 C), Min:98.2 F (36.8 C), Max:99.3 F (37.4 C)   Recent Labs Lab 10/08/16 2106 10/09/16 0257  WBC 19.2* 24.2*  CREATININE 0.64 0.83    Estimated Creatinine Clearance: 28 mL/min (by C-G formula based on SCr of 0.83 mg/dL).    Allergies  Allergen Reactions  . Aspirin Other (See Comments)    Upset stomach with high doses  . Codeine Other (See Comments)    Reaction:  Unknown   . Ditropan [Oxybutynin] Other (See Comments)    Reaction:  Unknown   . Hctz [Hydrochlorothiazide] Other (See Comments)    Reaction:  Causes pts sodium/potassium levels to drop significantly   . Metronidazole Other (See Comments)    Reaction:  Unknown   . Propranolol Other (See Comments)    Reaction:  Unknown   . Tavist [Clemastine] Other (See Comments)    Reaction:  Unknown   . Tramadol Other (See Comments)    Reaction:  Unknown   . Vicodin [Hydrocodone-Acetaminophen] Other (See Comments)    Reaction:  Unknown     Antimicrobials this admission: 3/30 LVQ >> 3/30 3/30 CFP >>  3/30 Vanc >>  Microbiology results: 3/30 BCx: sent x 2  Thank you for allowing pharmacy to be a part of this patient's care.  Gardner Candle, PharmD, BCPS Clinical Pharmacist 10/09/2016 10:12 AM

## 2016-10-09 NOTE — Progress Notes (Signed)
Patient is admitted to room 241 with the diagnosis of community acquired pneumonia. Alert and oriented x 3. Denied any acute pain. No respiratory distress noted. Tele box reconciled to CCMD by Carrolyn Meiers RN and Berna Spare NT. Skin assessment done with Wallace Keller RN, noted discoloration on bilateral upper and lower extremities, abrasion on left foot and redness on bonny prominent on the bilateral feet (pink foam applied).  Received ABT for Pneumonia, no adverse reactions noted. Daughter at bedside voiced no concerns. Will continue to monitor.

## 2016-10-09 NOTE — ED Notes (Signed)
RN on floor asked to obtain flu swab from pt.

## 2016-10-10 ENCOUNTER — Inpatient Hospital Stay: Payer: Medicare Other

## 2016-10-10 LAB — STREP PNEUMONIAE URINARY ANTIGEN: Strep Pneumo Urinary Antigen: NEGATIVE

## 2016-10-10 LAB — BASIC METABOLIC PANEL
Anion gap: 5 (ref 5–15)
BUN: 16 mg/dL (ref 6–20)
CALCIUM: 8.9 mg/dL (ref 8.9–10.3)
CO2: 28 mmol/L (ref 22–32)
CREATININE: 0.47 mg/dL (ref 0.44–1.00)
Chloride: 95 mmol/L — ABNORMAL LOW (ref 101–111)
GFR calc Af Amer: >60 mL/min (ref >60)
GFR calc non Af Amer: >60 mL/min (ref >60)
GLUCOSE: 177 mg/dL — AB (ref 65–99)
Potassium: 4.5 mmol/L (ref 3.5–5.1)
Sodium: 128 mmol/L — ABNORMAL LOW (ref 135–145)

## 2016-10-10 LAB — CBC
HEMATOCRIT: 27.3 % — AB (ref 35.0–47.0)
Hemoglobin: 9.3 g/dL — ABNORMAL LOW (ref 12.0–16.0)
MCH: 30.1 pg (ref 26.0–34.0)
MCHC: 34.2 g/dL (ref 32.0–36.0)
MCV: 88.1 fL (ref 80.0–100.0)
Platelets: 210 10*3/uL (ref 150–440)
RBC: 3.1 MIL/uL — ABNORMAL LOW (ref 3.80–5.20)
RDW: 13.7 % (ref 11.5–14.5)
WBC: 12.5 10*3/uL — AB (ref 3.6–11.0)

## 2016-10-10 LAB — TROPONIN I: TROPONIN I: 0.38 ng/mL — AB (ref <0.03)

## 2016-10-10 LAB — HIV ANTIBODY (ROUTINE TESTING W REFLEX): HIV Screen 4th Generation wRfx: NONREACTIVE

## 2016-10-10 MED ORDER — ALBUTEROL SULFATE (2.5 MG/3ML) 0.083% IN NEBU: 2.5000 mg | INHALATION_SOLUTION | Freq: Four times a day (QID) | RESPIRATORY_TRACT | Status: DC | PRN

## 2016-10-10 NOTE — Plan of Care (Signed)
Problem: Activity: Goal: Risk for activity intolerance will decrease Outcome: Progressing Patient ambulated into the bathroom with minimal assistance, tolerated well. Will continue to encourage mobility.

## 2016-10-10 NOTE — Consult Note (Signed)
[facility] Cardiology  CARDIOLOGY CONSULT NOTE  Patient ID: Alyssa Landry MRN: [id number] DOB/AGE: 01/06/1919 83 y.o.  Admit date: 10/08/2016 Referring Physician Nemiah Commander Primary Physician [facility] Primary Cardiologist Maziyah Vessel Reason for Consultation Elevated troponin  HPI: 83 year old female referred for evaluation of elevated troponin. The patient has a history of COPD, essential hypertension, diastolic congestive heart failure, and chronic atrial fibrillation. She is admitted with increased shortness of breath, with severe cough, with left basilar pneumonia. Admission labs were notable for borderline elevated troponin, 0.42, 0.61, 0.70, and 0.30. The patient denies chest pain. ECG does not reveal any acute ischemic ST-T wave changes.  The patient has chronic atrial fibrillation. She has a history of recent CVA 08/20/2016 with MRI after conversion. Aspirin and Plavix were held. Ventricular rate is mildly elevated to 90 to 110 bpm. The patient denies chest pain or palpitations.  Review of systems complete and found to be negative unless listed above     Past Medical History:  Diagnosis Date  . A-fib (HCC)   . Acid reflux   . Anemia   . Anxiety   . Arthritis   . Asthma   . Chronic hyponatremia   . COPD (chronic obstructive pulmonary disease) (HCC)   . GERD (gastroesophageal reflux disease)   . Hiatal hernia   . Hiatal hernia   . Hyperlipidemia   . Hypertension   . Hypothyroidism   . Incontinence   . Migraine   . Osteoporosis, post-menopausal   . Persistent cough   . Pulmonary fibrosis (HCC)   . SIADH (syndrome of inappropriate ADH production) (HCC)   . TIA (transient ischemic attack)     Past Surgical History:  Procedure Laterality Date  . ABDOMINAL HYSTERECTOMY  1966    Prescriptions Prior to Admission  Medication Sig Dispense Refill Last Dose  . acetaminophen (TYLENOL) 650 MG CR tablet Take 650 mg by mouth every 8 (eight) hours as needed for pain.   Taking  . albuterol  (PROVENTIL HFA;VENTOLIN HFA) 108 (90 BASE) MCG/ACT inhaler Inhale 1-2 puffs into the lungs every 4 (four) hours as needed for wheezing or shortness of breath.   Taking  . aspirin EC 81 MG EC tablet Take 1 tablet (81 mg total) by mouth daily.   Taking  . atorvastatin (LIPITOR) 40 MG tablet Take 1 tablet (40 mg total) by mouth daily at 6 PM. 30 tablet 2 Taking  . cholecalciferol (VITAMIN D) 1000 UNITS tablet Take 1,000 Units by mouth daily.   Taking  . cloNIDine (CATAPRES) 0.1 MG tablet Take 0.1 mg by mouth 3 (three) times daily.   Taking  . diltiazem (CARDIZEM CD) 120 MG 24 hr capsule Take 1 capsule (120 mg total) by mouth daily. 30 capsule 0 Taking  . docusate sodium (COLACE) 100 MG capsule Take 100 mg by mouth 2 (two) times daily.    Taking  . feeding supplement, ENSURE ENLIVE, (ENSURE ENLIVE) LIQD Take 237 mLs by mouth daily.    Taking  . ferrous sulfate 325 (65 FE) MG tablet Take 325 mg by mouth daily.    Taking  . fluticasone (FLONASE) 50 MCG/ACT nasal spray Place 2 sprays into both nostrils every morning.    Taking  . fluticasone (FLOVENT HFA) 110 MCG/ACT inhaler Inhale 2 puffs into the lungs 2 (two) times daily. 4098,1191   Taking  . furosemide (LASIX) 20 MG tablet Take 1 tablet (20 mg total) by mouth daily. (Patient taking differently: Take 20 mg by mouth 2 (two) times daily. ) 30  tablet 1 Taking  . levothyroxine (SYNTHROID, LEVOTHROID) 125 MCG tablet Take 125 mcg by mouth daily before breakfast.   Taking  . LORazepam (ATIVAN) 0.5 MG tablet Take 1 tablet (0.5 mg total) by mouth 3 (three) times daily. 30 tablet 0 Taking  . losartan (COZAAR) 50 MG tablet Take 50 mg by mouth daily.   Taking  . multivitamin-lutein (OCUVITE-LUTEIN) CAPS capsule Take 2 capsules by mouth daily.   Taking  . Oxybutynin Chloride (GELNIQUE) 10 % GEL Place 1 application onto the skin daily. 30 g 6 Taking  . polyethylene glycol (MIRALAX / GLYCOLAX) packet Take 8.5-17 g by mouth daily. *Mix in 4-8 ounces of fluid prior to  taking*   Taking  . ranitidine (ZANTAC) 150 MG tablet Take 150 mg by mouth 2 (two) times daily before a meal.    Taking  . sodium bicarbonate 650 MG tablet Take 325 mg by mouth 4 (four) times daily.   Taking  . spironolactone (ALDACTONE) 25 MG tablet Take 25 mg by mouth daily.   Taking  . tiotropium (SPIRIVA) 18 MCG inhalation capsule Place 18 mcg into inhaler and inhale daily.   Taking  . triamcinolone (KENALOG) 0.025 % ointment Apply 1 application topically 2 (two) times daily. 30 g 0   . vitamin B-12 (CYANOCOBALAMIN) 1000 MCG tablet Take 1,000 mcg by mouth daily.   Taking  . guaiFENesin-dextromethorphan (ROBITUSSIN DM) 100-10 MG/5ML syrup Take 10 mLs by mouth every 6 (six) hours as needed for cough. (Patient not taking: Reported on 09/04/2016) 118 mL 0 Not Taking at Unknown time   Social History   Social History  . Marital status: Widowed    Spouse name: N/A  . Number of children: N/A  . Years of education: N/A   Occupational History  . Not on file.   Social History Main Topics  . Smoking status: Never Smoker  . Smokeless tobacco: Never Used  . Alcohol use No  . Drug use: No  . Sexual activity: Not on file   Other Topics Concern  . Not on file   Social History Narrative  . No narrative on file    Family History  Problem Relation Age of Onset  . Leukemia Mother   . Heart disease Father   . Hypertension        Review of systems complete and found to be negative unless listed above      PHYSICAL EXAM  General: Well developed, well nourished, in no acute distress HEENT:  Normocephalic and atramatic Neck:  No JVD.  Lungs: Clear bilaterally to auscultation and percussion. Heart: HRRR . Normal S1 and S2 without gallops or murmurs.  Abdomen: Bowel sounds are positive, abdomen soft and non-tender  Msk:  Back normal, normal gait. Normal strength and tone for age. Extremities: No clubbing, cyanosis or edema.   Neuro: Alert and oriented X 3. Psych:  Good affect,  responds appropriately  Labs:   Lab Results  Component Value Date   WBC 12.5 (H) 10/10/2016   HGB 9.3 (L) 10/10/2016   HCT 27.3 (L) 10/10/2016   MCV 88.1 10/10/2016   PLT 210 10/10/2016    Recent Labs Lab 10/08/16 2106  10/10/16 0501  NA 131*  < > 128*  K 4.9  < > 4.5  CL 93*  < > 95*  CO2 32  < > 28  BUN 28*  < > 16  CREATININE 0.64  < > 0.47  CALCIUM 9.2  < > 8.9  PROT 7.2  --   --  BILITOT 0.7  --   --   ALKPHOS 86  --   --   ALT 18  --   --   AST 20  --   --   GLUCOSE 120*  < > 177*  < > = values in this interval not displayed. Lab Results  Component Value Date   CKTOTAL 63 08/11/2012   CKMB 1.4 08/11/2012   TROPONINI 0.38 (HH) 10/10/2016    Lab Results  Component Value Date   CHOL 95 09/05/2016   CHOL 142 08/20/2016   CHOL 220 (H) 08/10/2011   Lab Results  Component Value Date   HDL 58 09/05/2016   HDL 67 08/20/2016   HDL 106 (H) 08/10/2011   Lab Results  Component Value Date   LDLCALC 34 09/05/2016   LDLCALC 65 08/20/2016   LDLCALC 99 08/10/2011   Lab Results  Component Value Date   TRIG 16 09/05/2016   TRIG 50 08/20/2016   TRIG 73 08/10/2011   Lab Results  Component Value Date   CHOLHDL 1.6 09/05/2016   CHOLHDL 2.1 08/20/2016   No results found for: LDLDIRECT    Radiology: Dg Chest 2 View  Result Date: 10/10/2016 CLINICAL DATA:  Shortness of breath. EXAM: CHEST  2 VIEW COMPARISON:  October 08, 2016 FINDINGS: Biapical pleuroparenchymal thickening remains. Mild opacity seen in the right base, more prominent in the interval. Effusion and underlying opacity in the left base is similar in the interval. No change in the cardiomediastinal silhouette. IMPRESSION: 1. Persistent effusion and underlying opacity in the left base and lingula. Mild increased opacity in the right base. Recommend follow-up to resolution. Electronically Signed   By: Gerome Sam III M.D   On: 10/10/2016 08:05   Dg Chest 2 View  Result Date: 10/08/2016 CLINICAL DATA:   Acute onset of shortness of breath and generalized weakness. Initial encounter. EXAM: CHEST  2 VIEW COMPARISON:  Chest radiograph performed 09/04/2016 FINDINGS: The lungs are well-aerated. A small left pleural effusion is noted. Left basilar airspace opacification raises concern for pneumonia. There is no evidence of pneumothorax. The heart is mildly enlarged. Peribronchial thickening is noted. No acute osseous abnormalities are seen. IMPRESSION: 1. Small left pleural effusion. Left basilar airspace opacification raises concern for pneumonia. 2. Mild cardiomegaly. 3. Peribronchial thickening noted. Electronically Signed   By: Roanna Raider M.D.   On: 10/08/2016 20:48    EKG: Atrial fibrillation  ASSESSMENT AND PLAN:   1. Borderline elevated troponin, in the absence of chest pain or new ECG changes, in the setting of pneumonia and sepsis, likely demand supply ischemia 2. Chronic atrial fibrillation, currently not anticoagulated, with recent history of hemorrhagic conversion  Recommendations  1. Continue low-dose aspirin 2. Defer full dose anticoagulation 3. Defer further cardiac diagnostics at this time 4. Follow-up as outpatient  Signed: Marcina Millard MD,PhD, [facility] 10/10/2016, 8:51 AM

## 2016-10-10 NOTE — Progress Notes (Signed)
[employer] [facility] [place] at [facility]   PATIENT NAME: Alyssa Landry    MR#:  [id number]  DATE OF BIRTH:  09-14-1918  SUBJECTIVE:  CHIEF COMPLAINT:   Chief Complaint  Patient presents with  . Shortness of Breath   - feels some better today, some hemoptysis today - no nausea  REVIEW OF SYSTEMS:  Review of Systems  Constitutional: Positive for malaise/fatigue. Negative for chills and fever.  HENT: Negative for congestion, ear discharge, hearing loss and nosebleeds.   Respiratory: Positive for cough, hemoptysis and shortness of breath. Negative for wheezing.   Cardiovascular: Negative for chest pain, palpitations and leg swelling.  Gastrointestinal: Negative for abdominal pain, constipation, diarrhea, nausea and vomiting.  Genitourinary: Negative for dysuria.  Musculoskeletal: Negative for myalgias.  Neurological: Negative for dizziness, speech change, focal weakness, seizures and headaches.  Psychiatric/Behavioral: Negative for depression. The patient is not nervous/anxious.     DRUG ALLERGIES:   Allergies  Allergen Reactions  . Aspirin Other (See Comments)    Upset stomach with high doses  . Codeine Other (See Comments)    Reaction:  Unknown   . Ditropan [Oxybutynin] Other (See Comments)    Reaction:  Unknown   . Hctz [Hydrochlorothiazide] Other (See Comments)    Reaction:  Causes pts sodium/potassium levels to drop significantly   . Metronidazole Other (See Comments)    Reaction:  Unknown   . Propranolol Other (See Comments)    Reaction:  Unknown   . Tavist [Clemastine] Other (See Comments)    Reaction:  Unknown   . Tramadol Other (See Comments)    Reaction:  Unknown   . Vicodin [Hydrocodone-Acetaminophen] Other (See Comments)    Reaction:  Unknown     VITALS:  Blood pressure (!) 173/59, pulse 77, temperature 98 F (36.7 C), temperature source Oral, resp. rate 18, height  (1.448 m), weight 54.1 kg (119 lb 3.2 oz), SpO2 100 %.  PHYSICAL  EXAMINATION:  Physical Exam  GENERAL:  83 y.o.-year-old elderly patient lying in the bed with no acute distress.  EYES: Pupils equal, round, reactive to light and accommodation. No scleral icterus. Extraocular muscles intact.  HEENT: Head atraumatic, normocephalic. Oropharynx and nasopharynx clear.  NECK:  Supple, no jugular venous distention. No thyroid enlargement, no tenderness.  LUNGS: Normal breath sounds bilaterally, bibasilar crackles,  no wheezing, rhonchi. No use of accessory muscles of respiration.  CARDIOVASCULAR: S1, S2 normal. No  rubs, or gallops. 3/6 systolic murmur ABDOMEN: Soft, nontender, nondistended. Bowel sounds present. No organomegaly or mass.  EXTREMITIES: No pedal edema, cyanosis, or clubbing.  NEUROLOGIC: Cranial nerves II through XII are intact. Muscle strength 5/5 in all extremities. Sensation intact. Gait not checked. Global weakness noted. PSYCHIATRIC: The patient is alert and oriented x 3.  SKIN: No obvious rash, lesion, or ulcer.    LABORATORY PANEL:   CBC  Recent Labs Lab 10/10/16 0501  WBC 12.5*  HGB 9.3*  HCT 27.3*  PLT 210   ------------------------------------------------------------------------------------------------------------------  Chemistries   Recent Labs Lab 10/08/16 2106  10/10/16 0501  NA 131*  < > 128*  K 4.9  < > 4.5  CL 93*  < > 95*  CO2 32  < > 28  GLUCOSE 120*  < > 177*  BUN 28*  < > 16  CREATININE 0.64  < > 0.47  CALCIUM 9.2  < > 8.9  AST 20  --   --   ALT 18  --   --   ALKPHOS  86  --   --   BILITOT 0.7  --   --   < > = values in this interval not displayed. ------------------------------------------------------------------------------------------------------------------  Cardiac Enzymes  Recent Labs Lab 10/10/16 0501  TROPONINI 0.38*   ------------------------------------------------------------------------------------------------------------------  RADIOLOGY:  Dg Chest 2 View  Result Date:  10/10/2016 CLINICAL DATA:  Shortness of breath. EXAM: CHEST  2 VIEW COMPARISON:  October 08, 2016 FINDINGS: Biapical pleuroparenchymal thickening remains. Mild opacity seen in the right base, more prominent in the interval. Effusion and underlying opacity in the left base is similar in the interval. No change in the cardiomediastinal silhouette. IMPRESSION: 1. Persistent effusion and underlying opacity in the left base and lingula. Mild increased opacity in the right base. Recommend follow-up to resolution. Electronically Signed   By: Gerome Sam III M.D   On: 10/10/2016 08:05   Dg Chest 2 View  Result Date: 10/08/2016 CLINICAL DATA:  Acute onset of shortness of breath and generalized weakness. Initial encounter. EXAM: CHEST  2 VIEW COMPARISON:  Chest radiograph performed 09/04/2016 FINDINGS: The lungs are well-aerated. A small left pleural effusion is noted. Left basilar airspace opacification raises concern for pneumonia. There is no evidence of pneumothorax. The heart is mildly enlarged. Peribronchial thickening is noted. No acute osseous abnormalities are seen. IMPRESSION: 1. Small left pleural effusion. Left basilar airspace opacification raises concern for pneumonia. 2. Mild cardiomegaly. 3. Peribronchial thickening noted. Electronically Signed   By: Roanna Raider M.D.   On: 10/08/2016 20:48    EKG:   Orders placed or performed during the hospital encounter of 10/08/16  . ED EKG  . ED EKG  . EKG 12-Lead    ASSESSMENT AND PLAN:   83y/o F with PMH of Afib not on anticoagulation due to hemorrhagic CVA last month, anenmia, arthritis, SIADH with chronic hyponatremia, pulmonary fibrosis on chronic home o2 presents to the hospital secondary to sepsis.  #1 Sepsis- secondary to HCAP due to recent hospitalizations - f/u blood cultures, cont o2 support - monitor wbc and fevers - on vancomycin and cefepime -Due to ongoing dyspnea, and history of pulmonary fibrosis-steroids added yesterday but  some improvement. We'll continue steroids and also nebulizer treatments at this time  #2 Elevated troponin- demand ischemia, Stable. Patient denies any chest pain. -Appreciate cardiology consult. No further workup recommended  #3 HTN- with sepsis, BP improving. - restart her home medications  #4 Chronic hyponatremia- with SIADH Monitor closely, lasix, fluid restriction added Continue sodium bicarb tabs  #5 Chronic afib- rate controlled, cardizem Monitor closely, on asa for anticoagulation  #6 H/o hemorrhagic CVA recently- right eye peripheral vision loss On asa or now, also statin  #7  DVT Prophylaxis- on lovenox  Physical Therapy consulted. Recommended SNF     All the records are reviewed and case discussed with Care Management/Social Workerr. Management plans discussed with the patient, family and they are in agreement.  CODE STATUS: DNR  TOTAL TIME TAKING CARE OF THIS PATIENT: 39 minutes.   POSSIBLE D/C IN 1-2 DAYS, DEPENDING ON CLINICAL CONDITION.   Enid Baas M.D on 10/10/2016 at 11:34 AM  Between 7am to 6pm - Pager - [phone]  After 6pm go to [email or web address] - Social research officer, government  [facility]  Office  [phone]  CC: Primary care physician; Rafael Bihari, MD

## 2016-10-11 LAB — BASIC METABOLIC PANEL
ANION GAP: 3 — AB (ref 5–15)
BUN: 16 mg/dL (ref 6–20)
CHLORIDE: 97 mmol/L — AB (ref 101–111)
CO2: 29 mmol/L (ref 22–32)
Calcium: 9 mg/dL (ref 8.9–10.3)
Creatinine, Ser: 0.58 mg/dL (ref 0.44–1.00)
GFR calc non Af Amer: >60 mL/min (ref >60)
Glucose, Bld: 137 mg/dL — ABNORMAL HIGH (ref 65–99)
Potassium: 4.8 mmol/L (ref 3.5–5.1)
Sodium: 129 mmol/L — ABNORMAL LOW (ref 135–145)

## 2016-10-11 MED ORDER — FUROSEMIDE 20 MG PO TABS
20.0000 mg | ORAL_TABLET | Freq: Every day | ORAL | Status: DC
  Administered 2016-10-11 – 2016-10-12 (×2): 20 mg via ORAL
  Filled 2016-10-11 (×2): qty 1

## 2016-10-11 MED ORDER — CLONIDINE HCL 0.1 MG PO TABS
0.1000 mg | ORAL_TABLET | Freq: Three times a day (TID) | ORAL | Status: DC
  Administered 2016-10-11 – 2016-10-12 (×4): 0.1 mg via ORAL
  Filled 2016-10-11 (×4): qty 1

## 2016-10-11 MED ORDER — DILTIAZEM HCL ER COATED BEADS 120 MG PO CP24
120.0000 mg | ORAL_CAPSULE | Freq: Every day | ORAL | Status: DC
  Administered 2016-10-11 – 2016-10-12 (×2): 120 mg via ORAL
  Filled 2016-10-11 (×2): qty 1

## 2016-10-11 MED ORDER — IPRATROPIUM-ALBUTEROL 0.5-2.5 (3) MG/3ML IN SOLN
3.0000 mL | Freq: Four times a day (QID) | RESPIRATORY_TRACT | Status: DC
  Administered 2016-10-11 – 2016-10-12 (×5): 3 mL via RESPIRATORY_TRACT
  Filled 2016-10-11 (×5): qty 3

## 2016-10-11 NOTE — Progress Notes (Signed)
[employer] - [place] at [facility]   PATIENT NAME: Alyssa Landry    MR#:  [id number]  DATE OF BIRTH:  [birth year]  SUBJECTIVE:  CHIEF COMPLAINT:   Chief Complaint  Patient presents with  . Shortness of Breath   - Had a better day yesterday, coughing is improved. - woke up with an episode of shortness of breath this morning again.  REVIEW OF SYSTEMS:  Review of Systems  Constitutional: Positive for malaise/fatigue. Negative for chills and fever.  HENT: Negative for congestion, ear discharge, hearing loss and nosebleeds.   Respiratory: Positive for cough, hemoptysis and shortness of breath. Negative for wheezing.   Cardiovascular: Negative for chest pain, palpitations and leg swelling.  Gastrointestinal: Negative for abdominal pain, constipation, diarrhea, nausea and vomiting.  Genitourinary: Negative for dysuria.  Musculoskeletal: Negative for myalgias.  Neurological: Negative for dizziness, speech change, focal weakness, seizures and headaches.  Psychiatric/Behavioral: Negative for depression. The patient is not nervous/anxious.     DRUG ALLERGIES:   Allergies  Allergen Reactions  . Aspirin Other (See Comments)    Upset stomach with high doses  . Codeine Other (See Comments)    Reaction:  Unknown   . Ditropan [Oxybutynin] Other (See Comments)    Reaction:  Unknown   . Hctz [Hydrochlorothiazide] Other (See Comments)    Reaction:  Causes pts sodium/potassium levels to drop significantly   . Metronidazole Other (See Comments)    Reaction:  Unknown   . Propranolol Other (See Comments)    Reaction:  Unknown   . Tavist [Clemastine] Other (See Comments)    Reaction:  Unknown   . Tramadol Other (See Comments)    Reaction:  Unknown   . Vicodin [Hydrocodone-Acetaminophen] Other (See Comments)    Reaction:  Unknown     VITALS:  Blood pressure (!) 165/72, pulse 75, temperature 97.5 F (36.4 C), temperature source Oral, resp. rate 16, height  (1.448  m), weight 54.1 kg (119 lb 3.2 oz), SpO2 99 %.  PHYSICAL EXAMINATION:  Physical Exam  GENERAL:  83 y.o.-year-old elderly patient lying in the bed with no acute distress.  EYES: Pupils equal, round, reactive to light and accommodation. No scleral icterus. Extraocular muscles intact.  HEENT: Head atraumatic, normocephalic. Oropharynx and nasopharynx clear.  NECK:  Supple, no jugular venous distention. No thyroid enlargement, no tenderness.  LUNGS: Normal breath sounds bilaterally, bibasilar crackles, significantly decreased bibasilar breath sounds and scattered wheezing noted today. No use of accessory muscles of respiration.  CARDIOVASCULAR: S1, S2 normal. No  rubs, or gallops. 3/6 systolic murmur ABDOMEN: Soft, nontender, nondistended. Bowel sounds present. No organomegaly or mass.  EXTREMITIES: No pedal edema, cyanosis, or clubbing.  NEUROLOGIC: Cranial nerves II through XII are intact. Muscle strength 5/5 in all extremities. Sensation intact. Gait not checked. Global weakness noted. PSYCHIATRIC: The patient is alert and oriented x 3.  SKIN: No obvious rash, lesion, or ulcer.    LABORATORY PANEL:   CBC  Recent Labs Lab 10/10/16 0501  WBC 12.5*  HGB 9.3*  HCT 27.3*  PLT 210   ------------------------------------------------------------------------------------------------------------------  Chemistries   Recent Labs Lab 10/08/16 2106  10/11/16 0440  NA 131*  < > 129*  K 4.9  < > 4.8  CL 93*  < > 97*  CO2 32  < > 29  GLUCOSE 120*  < > 137*  BUN 28*  < > 16  CREATININE 0.64  < > 0.58  CALCIUM 9.2  < > 9.0  AST 20  --   --  ALT 18  --   --   ALKPHOS 86  --   --   BILITOT 0.7  --   --   < > = values in this interval not displayed. ------------------------------------------------------------------------------------------------------------------  Cardiac Enzymes  Recent Labs Lab 10/10/16 0501  TROPONINI 0.38*    ------------------------------------------------------------------------------------------------------------------  RADIOLOGY:  Dg Chest 2 View  Result Date: 10/10/2016 CLINICAL DATA:  Shortness of breath. EXAM: CHEST  2 VIEW COMPARISON:  October 08, 2016 FINDINGS: Biapical pleuroparenchymal thickening remains. Mild opacity seen in the right base, more prominent in the interval. Effusion and underlying opacity in the left base is similar in the interval. No change in the cardiomediastinal silhouette. IMPRESSION: 1. Persistent effusion and underlying opacity in the left base and lingula. Mild increased opacity in the right base. Recommend follow-up to resolution. Electronically Signed   By: Gerome Sam III M.D   On: 10/10/2016 08:05    EKG:   Orders placed or performed during the hospital encounter of 10/08/16  . ED EKG  . ED EKG  . EKG 12-Lead    ASSESSMENT AND PLAN:   83y/o F with PMH of Afib not on anticoagulation due to hemorrhagic CVA last month, anenmia, arthritis, SIADH with chronic hyponatremia, pulmonary fibrosis on chronic home o2 presents to the hospital secondary to sepsis.  #1 Sepsis- secondary to HCAP due to recent hospitalizations - negative blood cultures, cont o2 support - monitor wbc, fevers are improving. - on  Cefepime, d/c vancomycin -Due to ongoing dyspnea, and history of pulmonary fibrosis-steroids added with some improvement. We'll continue steroids and also nebulizer treatments at this time  #2 Elevated troponin- demand ischemia, Stable. Patient denies any chest pain. -Appreciate cardiology consult. No further workup recommended  #3 HTN- with sepsis, BP improving. - restarted her home medications  #4 Chronic hyponatremia- with SIADH Monitor closely, lasix, fluid restriction added Continue sodium bicarb tabs  #5 Chronic afib- rate controlled, cardizem Monitor closely, on asa for anticoagulation  #6 H/o hemorrhagic CVA recently- right eye peripheral  vision loss On asa for now, also statin  #7  DVT Prophylaxis- on lovenox  Physical Therapy consulted. Recommended SNF     All the records are reviewed and case discussed with Care Management/Social Workerr. Management plans discussed with the patient, family and they are in agreement.  CODE STATUS: DNR  TOTAL TIME TAKING CARE OF THIS PATIENT: 39 minutes.   POSSIBLE D/C IN 1-2 DAYS, DEPENDING ON CLINICAL CONDITION.   Enid Baas M.D on 10/11/2016 at 11:18 AM  Between 7am to 6pm - Pager - [phone]  After 6pm go to [email or web address] - Social research officer, government  [facility]  Office  [phone]  CC: Primary care physician; Rafael Bihari, MD

## 2016-10-11 NOTE — Progress Notes (Signed)
Pt had a hemorrhagic stroke 6 weeks ago, so daughter is concerned about lovenox injections. As per daughter neurologist had told " No anticoagulants "  I am discontinuing lovenox.

## 2016-10-11 NOTE — [place] FL2 (Signed)
[place] MEDICAID FL2 LEVEL OF CARE SCREENING TOOL     IDENTIFICATION  Patient Name: Alyssa Landry Birthdate: [birth year] Sex: female Admission Date (Current Location): 10/08/2016  [place] and [place] Number:  Chiropodist and Address:  [facility], [address], [place], [place] [address]      Provider Number: [id number]  Attending Physician Name and Address:  Enid Baas, MD  Relative Name and Phone Number:       Current Level of Care: Hospital Recommended Level of Care: Skilled Nursing Facility Prior Approval Number:    Date Approved/Denied:   PASRR Number: [id number]  Discharge Plan: SNF    Current Diagnoses: Patient Active Problem List   Diagnosis Date Noted  . HCAP (healthcare-associated pneumonia) 10/08/2016  . TIA (transient ischemic attack) 09/04/2016  . Stroke (HCC) 08/20/2016  . CHF (congestive heart failure) (HCC) 05/01/2016  . Chronic diastolic heart failure (HCC) 04/16/2016  . HTN (hypertension) 04/16/2016  . Hypoxia   . SOB (shortness of breath)   . Collapse of left lung   . Pleural effusion   . Centrilobular emphysema (HCC)   . Hyponatremia   . Pressure ulcer 02/19/2016  . Atrial fibrillation with RVR (HCC) 12/11/2015  . Urge incontinence 09/26/2015  . Lichenified rash 09/26/2015  . Syncopal episodes 09/25/2015    Orientation RESPIRATION BLADDER Height & Weight     Self, Time, Situation  O2 (2L o2, [place] acute) Continent Weight: 119 lb 3.2 oz (54.1 kg) Height:   (144.8 cm)  BEHAVIORAL SYMPTOMS/MOOD NEUROLOGICAL BOWEL NUTRITION STATUS      Continent Diet, Supplemental (Cardiac, Supplemental with Ensure)  AMBULATORY STATUS COMMUNICATION OF NEEDS Skin   Extensive Assist Verbally Normal                       Personal Care Assistance Level of Assistance  Bathing, Feeding, Dressing Bathing Assistance: Limited assistance Feeding assistance: Independent Dressing Assistance: Limited  assistance     Functional Limitations Info  Sight Sight Info: Impaired (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 0.9 %  sodium chloride infusion   Intravenous Continuous Alexis Hugelmeyer, DO 75 mL/hr at 10/10/16 1813    . acetaminophen (TYLENOL) tablet 650 mg  650 mg Oral Q6H PRN Alexis Hugelmeyer, DO       Or  . acetaminophen (TYLENOL) suppository 650 mg  650 mg Rectal Q6H PRN Alexis Hugelmeyer, DO      . aspirin EC tablet 81 mg  81 mg Oral Daily Alexis Hugelmeyer, DO   81 mg at 10/11/16 0849  . atorvastatin (LIPITOR) tablet 40 mg  40 mg Oral q1800 Alexis Hugelmeyer, DO   40 mg at 10/10/16 1703  . benzonatate (TESSALON) capsule 100 mg  100 mg Oral TID PRN Alexis Hugelmeyer, DO      . bisacodyl (DULCOLAX)  EC tablet 5 mg  5 mg Oral Daily PRN Alexis Hugelmeyer, DO      . budesonide (PULMICORT) nebulizer solution 0.5 mg  0.5 mg Nebulization BID Enid Baas, MD   0.5 mg at 10/11/16 0749  . ceFEPIme (MAXIPIME) 2 g in dextrose 5 % 50 mL IVPB  2 g Intravenous Q24H Sheema M Hallaji, RPH   2 g at 10/11/16 0847  . cholecalciferol (VITAMIN D) tablet 1,000 Units  1,000 Units Oral Daily Alexis Hugelmeyer, DO   1,000 Units at 10/11/16 0848  . cloNIDine (CATAPRES) tablet 0.1 mg  0.1 mg Oral TID Enid Baas, MD      . guaiFENesin (MUCINEX) 12 hr tablet 600 mg  600 mg Oral BID Alexis  Hugelmeyer, DO   600 mg at 10/11/16 0849   And  . dextromethorphan (DELSYM) 30 MG/5ML liquid 30 mg  30 mg Oral BID Alexis Hugelmeyer, DO   30 mg at 10/11/16 0847  . diltiazem (CARDIZEM CD) 24 hr capsule 120 mg  120 mg Oral Daily Enid Baas, MD   120 mg at 10/11/16 1025  . docusate sodium (COLACE) capsule 100 mg  100 mg Oral BID Alexis Hugelmeyer, DO   100 mg at 10/11/16 0849  . enoxaparin (LOVENOX) injection 30 mg  30 mg Subcutaneous Q24H Enid Baas, MD   30 mg at 10/10/16 2215  . famotidine (PEPCID) tablet 20 mg  20 mg Oral Daily Alexis Hugelmeyer, DO   20 mg at 10/11/16 0849  . feeding supplement (ENSURE ENLIVE) (ENSURE ENLIVE) liquid 237 mL  237 mL Oral Daily Alexis Hugelmeyer, DO   237 mL at 10/10/16 0753  . ferrous sulfate tablet 325 mg  325 mg Oral Daily Alexis Hugelmeyer, DO   325 mg at 10/11/16 0848  . fluticasone (FLONASE) 50 MCG/ACT nasal spray 2 spray  2 spray Each Nare BH-[id number] Alexis Hugelmeyer, DO   2 spray at 10/11/16 0846  . furosemide (LASIX) tablet 20 mg  20 mg Oral Daily Enid Baas, MD   20 mg at 10/11/16 1025  . ipratropium-albuterol (DUONEB) 0.5-2.5 (3) MG/3ML nebulizer solution 3 mL  3 mL Nebulization Q6H Enid Baas, MD      . levothyroxine (SYNTHROID, LEVOTHROID) tablet 125 mcg  125 mcg Oral QAC breakfast Alexis Hugelmeyer, DO   125 mcg at 10/11/16 0848  . LORazepam (ATIVAN) tablet 0.5 mg  0.5 mg Oral TID Alexis Hugelmeyer, DO   0.5 mg at 10/11/16 0848  . magnesium citrate solution 1 Bottle  1 Bottle Oral Once PRN Alexis Hugelmeyer, DO      . methylPREDNISolone sodium succinate (SOLU-MEDROL) 40 mg/mL injection 40 mg  40 mg Intravenous Q12H Enid Baas, MD   40 mg at 10/11/16 0530  . multivitamin-lutein (OCUVITE-LUTEIN) capsule 2 capsule  2 capsule Oral Daily Alexis Hugelmeyer, DO   2 capsule at 10/11/16 0848  . ondansetron (ZOFRAN) tablet 4 mg  4 mg Oral Q6H PRN Alexis Hugelmeyer, DO       Or  . ondansetron (ZOFRAN) injection 4 mg  4 mg  Intravenous Q6H PRN Alexis Hugelmeyer, DO   4 mg at 10/09/16 0927  . polyethylene glycol (MIRALAX / GLYCOLAX) packet 8.5-17 g  8.5-17 g Oral Daily Alexis Hugelmeyer, DO   17 g at 10/11/16 0850  . promethazine (PHENERGAN) injection 6.25 mg  6.25 mg Intravenous Q6H PRN Enid Baas, MD      . senna-docusate (Senokot-S) tablet 1 tablet  1 tablet Oral QHS PRN Alexis Hugelmeyer, DO      .  sodium bicarbonate tablet 325 mg  325 mg Oral QID Alexis Hugelmeyer, DO   325 mg at 10/11/16 0848  . sodium chloride flush (NS) 0.9 % injection 3 mL  3 mL Intravenous Q12H Alexis Hugelmeyer, DO   3 mL at 10/11/16 0850  . spironolactone (ALDACTONE) tablet 25 mg  25 mg Oral Daily Alexis Hugelmeyer, DO   25 mg at 10/11/16 0849  . vitamin B-12 (CYANOCOBALAMIN) tablet 1,000 mcg  1,000 mcg Oral Daily Alexis Hugelmeyer, DO   1,000 mcg at 10/11/16 0848     Discharge Medications: Please see discharge summary for a list of discharge medications.  Relevant Imaging Results:  Relevant Lab Results:   Additional Information SS# [id number]  Judi Cong, LCSW

## 2016-10-11 NOTE — Clinical Social Work Placement (Signed)
   CLINICAL SOCIAL WORK PLACEMENT  NOTE  Date:  10/11/2016  Patient Details  Name: Alyssa Landry MRN: [id number] Date of Birth: 1918-08-27  Clinical Social Work is seeking post-discharge placement for this patient at the Skilled  Nursing Facility level of care (*CSW will initial, date and re-position this form in  chart as items are completed):  Yes   Patient/family provided with [facility] Clinical Social Work Department's list of facilities offering this level of care within the geographic area requested by the patient (or if unable, by the patient's family).  Yes   Patient/family informed of their freedom to choose among providers that offer the needed level of care, that participate in Medicare, Medicaid or managed care program needed by the patient, have an available bed and are willing to accept the patient.  Yes   Patient/family informed of [facility]'s ownership interest in [facility] and [facility], as well as of the fact that they are under no obligation to receive care at these facilities.  PASRR submitted to EDS on       PASRR number received on       Existing PASRR number confirmed on 10/11/16     FL2 transmitted to all facilities in geographic area requested by pt/family on 10/11/16     FL2 transmitted to all facilities within larger geographic area on       Patient informed that his/her managed care company has contracts with or will negotiate with certain facilities, including the following:            Patient/family informed of bed offers received.  Patient chooses bed at       Physician recommends and patient chooses bed at      Patient to be transferred to   on  .  Patient to be transferred to facility by       Patient family notified on   of transfer.  Name of family member notified:        PHYSICIAN       Additional Comment:    _______________________________________________ Judi Cong, LCSW 10/11/2016, 2:58 PM

## 2016-10-11 NOTE — Progress Notes (Signed)
[facility] Cardiology  SUBJECTIVE: I'm feeling better   Vitals:   10/10/16 1931 10/10/16 1955 10/11/16 0509 10/11/16 0737  BP:  (!) 167/55 (!) 163/74 (!) 193/82  Pulse:  66 94 75  Resp:  Temp:  98.2 F (36.8 C) 98.3 F (36.8 C) 97.5 F (36.4 C)  TempSrc:  Oral Oral Oral  SpO2: 100% 100% 99% 99%  Weight:      Height:         Intake/Output Summary (Last 24 hours) at 10/11/16 1020 Last data filed at 10/11/16 0981  Gross per 24 hour  Intake          2196.25 ml  Output             1200 ml  Net           996.25 ml      PHYSICAL EXAM  General: Well developed, well nourished, in no acute distress HEENT:  Normocephalic and atramatic Neck:  No JVD.  Lungs: Clear bilaterally to auscultation and percussion. Heart: HRRR . Normal S1 and S2 without gallops or murmurs.  Abdomen: Bowel sounds are positive, abdomen soft and non-tender  Msk:  Back normal, normal gait. Normal strength and tone for age. Extremities: No clubbing, cyanosis or edema.   Neuro: Alert and oriented X 3. Psych:  Good affect, responds appropriately   LABS: Basic Metabolic Panel:  Recent Labs  19/14/78 0501 10/11/16 0440  NA 128* 129*  K 4.5 4.8  CL 95* 97*  CO2 28 29  GLUCOSE 177* 137*  BUN 16 16  CREATININE 0.47 0.58  CALCIUM 8.9 9.0   Liver Function Tests:  Recent Labs  10/08/16 2106  AST 20  ALT 18  ALKPHOS 86  BILITOT 0.7  PROT 7.2  ALBUMIN 4.1   No results for input(s): LIPASE, AMYLASE in the last 72 hours. CBC:  Recent Labs  10/08/16 2106 10/09/16 0257 10/10/16 0501  WBC 19.2* 24.2* 12.5*  NEUTROABS 17.2*  --   --   HGB 10.6* 10.2* 9.3*  HCT 31.5* 30.3* 27.3*  MCV 86.3 86.7 88.1  PLT 275 260 210   Cardiac Enzymes:  Recent Labs  10/09/16 1726 10/09/16 2254 10/10/16 0501  TROPONINI 0.61* 0.70* 0.38*   BNP: Invalid input(s): POCBNP D-Dimer: No results for input(s): DDIMER in the last 72 hours. Hemoglobin A1C: No results for input(s): HGBA1C in the last 72  hours. Fasting Lipid Panel: No results for input(s): CHOL, HDL, LDLCALC, TRIG, CHOLHDL, LDLDIRECT in the last 72 hours. Thyroid Function Tests: No results for input(s): TSH, T4TOTAL, T3FREE, THYROIDAB in the last 72 hours.  Invalid input(s): FREET3 Anemia Panel: No results for input(s): VITAMINB12, FOLATE, FERRITIN, TIBC, IRON, RETICCTPCT in the last 72 hours.  Dg Chest 2 View  Result Date: 10/10/2016 CLINICAL DATA:  Shortness of breath. EXAM: CHEST  2 VIEW COMPARISON:  October 08, 2016 FINDINGS: Biapical pleuroparenchymal thickening remains. Mild opacity seen in the right base, more prominent in the interval. Effusion and underlying opacity in the left base is similar in the interval. No change in the cardiomediastinal silhouette. IMPRESSION: 1. Persistent effusion and underlying opacity in the left base and lingula. Mild increased opacity in the right base. Recommend follow-up to resolution. Electronically Signed   By: Gerome Sam III M.D   On: 10/10/2016 08:05     Echo LVEF 55-60%  TELEMETRY: Atrial fibrillation:  ASSESSMENT AND PLAN:  Active Problems:   HCAP (healthcare-associated pneumonia)    1. Borderline elevated  troponin, and the absence of chest pain or new ECG changes, in the setting of pneumonia and sepsis, likely demand supply ischemia, normal left ventricular function by 2-D echocardiogram 2. Chronic atrial fibrillation, currently not anticoagulated, with recent history of CVA with hemorrhagic conversion 3. Pneumonia/sepsis  Recommendations  1. Continue low-dose aspirin 2. Defer full dose anticoagulation 3. Defer further cardiac diagnostics at this time 4. Follow-up as outpatient  Signed off for now, please call if any questions   Marcina Millard, MD, PhD, [facility] 10/11/2016 10:20 AM

## 2016-10-11 NOTE — Clinical Social Work Note (Signed)
Clinical Social Work Assessment  Patient Details  Name: Alyssa Landry MRN: [id number] Date of Birth: 1919/01/03  Date of referral:  10/11/16               Reason for consult:  Facility Placement                Permission sought to share information with:  Chartered certified accountant granted to share information::  Yes, Verbal Permission Granted  Name::        Agency::     Relationship::     Contact Information:     Housing/Transportation Living arrangements for the past 2 months:  Single Family Home Source of Information:  Patient, Adult Children Patient Interpreter Needed:  None Criminal Activity/Legal Involvement Pertinent to Current Situation/Hospitalization:  No - Comment as needed Significant Relationships:  Adult Children Lives with:  Adult Children Do you feel safe going back to the place where you live?  Yes Need for family participation in patient care:  No (Coment)  Care giving concerns:  PT rec for SNF   Social Worker assessment / plan:  The CSW met with the patient, her daughter, and her son-in-law at bedside to discuss dc planning. The patient gave verbal permission to conduct a bed search and indicated that [facility] is her first choice.  At baseline, the patient lives with her daughter in a single family home. The patient uses a 2W walker and is able to toilet independently. The patient's daughter sets up her medications, but the patient manages the time to take her meds appropriately. The CSW explained the referral process as well as Medicare guidelines.  Employment status:  Retired Forensic scientist:  [employer] PT Recommendations:  [place] / Referral to community resources:  [place]  Patient/Family's Response to care:  The patient and her family thanked the CSW for assistance.  Patient/Family's Understanding of and Emotional Response to Diagnosis, Current Treatment, and Prognosis:  The patient  understands the need for STR and is in agreement.  Emotional Assessment Appearance:  Appears stated age Attitude/Demeanor/Rapport:   (Pleasant) Affect (typically observed):  Accepting, Appropriate, Pleasant Orientation:  Oriented to Self, Oriented to Place, Oriented to Situation, Oriented to  Time Alcohol / Substance use:  Never Used Psych involvement (Current and /or in the community):  No (Comment)  Discharge Needs  Concerns to be addressed:  Discharge Planning Concerns, Care Coordination Readmission within the last 30 days:  No Current discharge risk:  Chronically ill Barriers to Discharge:  Continued Medical Work up   [employer], LCSW 10/11/2016, 2:58 PM

## 2016-10-11 NOTE — Progress Notes (Signed)
Pharmacy Antibiotic Note  Alyssa Landry is a 83 y.o. female admitted on 10/08/2016 with pneumonia.  Pharmacy has been consulted for cefepime dosing.  Plan: Patient already on cefepime 2g IV Q24hrs.   Height:  (144.8 cm) Weight: 119 lb 3.2 oz (54.1 kg) IBW/kg (Calculated) : 38.6  Temp (24hrs), Avg:98 F (36.7 C), Min:97.5 F (36.4 C), Max:98.3 F (36.8 C)   Recent Labs Lab 10/08/16 2106 10/09/16 0257 10/10/16 0501 10/11/16 0440  WBC 19.2* 24.2* 12.5*  --   CREATININE 0.64 0.83 0.47 0.58    Estimated Creatinine Clearance: 29.1 mL/min (by C-G formula based on SCr of 0.58 mg/dL).    Allergies  Allergen Reactions  . Aspirin Other (See Comments)    Upset stomach with high doses  . Codeine Other (See Comments)    Reaction:  Unknown   . Ditropan [Oxybutynin] Other (See Comments)    Reaction:  Unknown   . Hctz [Hydrochlorothiazide] Other (See Comments)    Reaction:  Causes pts sodium/potassium levels to drop significantly   . Metronidazole Other (See Comments)    Reaction:  Unknown   . Propranolol Other (See Comments)    Reaction:  Unknown   . Tavist [Clemastine] Other (See Comments)    Reaction:  Unknown   . Tramadol Other (See Comments)    Reaction:  Unknown   . Vicodin [Hydrocodone-Acetaminophen] Other (See Comments)    Reaction:  Unknown     Antimicrobials this admission: Vancomycin 3/30 >> 4/1 Cefepime 3/31 >>   Microbiology results: 3/29 BCx: NGTD 3/30 MRSA PCR: Positive  Thank you for allowing pharmacy to be a part of this patient's care.  Delsa Bern, PharmD 10/11/2016 11:49 AM

## 2016-10-12 LAB — LEGIONELLA PNEUMOPHILA SEROGP 1 UR AG: L. pneumophila Serogp 1 Ur Ag: NEGATIVE

## 2016-10-12 MED ORDER — LORAZEPAM 0.5 MG PO TABS: 0.5000 mg | ORAL_TABLET | Freq: Three times a day (TID) | ORAL | 0 refills | Status: AC

## 2016-10-12 MED ORDER — IPRATROPIUM-ALBUTEROL 0.5-2.5 (3) MG/3ML IN SOLN: 3.0000 mL | Freq: Four times a day (QID) | RESPIRATORY_TRACT | 0 refills | Status: DC | PRN

## 2016-10-12 MED ORDER — BUDESONIDE 0.5 MG/2ML IN SUSP: 0.5000 mg | Freq: Two times a day (BID) | RESPIRATORY_TRACT | 0 refills | Status: AC

## 2016-10-12 MED ORDER — BENZONATATE 100 MG PO CAPS: 100.0000 mg | ORAL_CAPSULE | Freq: Three times a day (TID) | ORAL | 0 refills | Status: DC

## 2016-10-12 MED ORDER — FUROSEMIDE 20 MG PO TABS: 20.0000 mg | ORAL_TABLET | Freq: Every day | ORAL | 2 refills | Status: DC

## 2016-10-12 MED ORDER — CEFDINIR 250 MG/5ML PO SUSR: 250.0000 mg | Freq: Two times a day (BID) | ORAL | 0 refills | Status: DC

## 2016-10-12 MED ORDER — PREDNISONE 10 MG (48) PO TBPK: ORAL_TABLET | ORAL | 0 refills | Status: DC

## 2016-10-12 NOTE — Care Management Important Message (Signed)
Important Message  Patient Details  Name: Alyssa Landry MRN: [id number] Date of Birth: Nov 27, 1918   Medicare Important Message Given:  Yes    Eber Hong, RN 10/12/2016, 2:44 PM

## 2016-10-12 NOTE — Progress Notes (Signed)
EMS here to transport pt to [facility] 

## 2016-10-12 NOTE — Clinical Social Work Note (Signed)
Patient to be d/c'ed today to [facility].  Patient and family agreeable to plans will transport via ems RN to call report to [facility] room [address], [phone].  Windell Moulding, MSW, Theresia Majors [phone]

## 2016-10-12 NOTE — Plan of Care (Signed)
Problem: Safety: Goal: Ability to remain free from injury will improve Outcome: Progressing Fall precautions in place, non skid socks  Problem: Pain Managment: Goal: General experience of comfort will improve Outcome: Progressing Prn Medications  Problem: Physical Regulation: Goal: Will remain free from infection Outcome: Not Progressing Remains on IV antibiotics  Problem: Activity: Goal: Ability to tolerate increased activity will improve Outcome: Progressing Working with PT

## 2016-10-12 NOTE — Progress Notes (Signed)
Report called to David Stall, RN at [facility].

## 2016-10-12 NOTE — Progress Notes (Signed)
EMS called for non emergent transport to [facility] 

## 2016-10-12 NOTE — Progress Notes (Signed)
Pharmacy Antibiotic Note  Alyssa Landry is a 83 y.o. female admitted on 10/08/2016 with pneumonia.  Pharmacy has been consulted for cefepime dosing.  Plan: Continue cefepime 2 g iv q 24 hours.   Height:  (144.8 cm) Weight: 119 lb 3.2 oz (54.1 kg) IBW/kg (Calculated) : 38.6  Temp (24hrs), Avg:97.6 F (36.4 C), Min:97.4 F (36.3 C), Max:97.8 F (36.6 C)   Recent Labs Lab 10/08/16 2106 10/09/16 0257 10/10/16 0501 10/11/16 0440  WBC 19.2* 24.2* 12.5*  --   CREATININE 0.64 0.83 0.47 0.58    Estimated Creatinine Clearance: 29.1 mL/min (by C-G formula based on SCr of 0.58 mg/dL).    Allergies  Allergen Reactions  . Aspirin Other (See Comments)    Upset stomach with high doses  . Codeine Other (See Comments)    Reaction:  Unknown   . Ditropan [Oxybutynin] Other (See Comments)    Reaction:  Unknown   . Hctz [Hydrochlorothiazide] Other (See Comments)    Reaction:  Causes pts sodium/potassium levels to drop significantly   . Metronidazole Other (See Comments)    Reaction:  Unknown   . Propranolol Other (See Comments)    Reaction:  Unknown   . Tavist [Clemastine] Other (See Comments)    Reaction:  Unknown   . Tramadol Other (See Comments)    Reaction:  Unknown   . Vicodin [Hydrocodone-Acetaminophen] Other (See Comments)    Reaction:  Unknown     Antimicrobials this admission: 3/30 LVQ >> 3/30 3/30 CFP >>  3/30 Vanc >> 4/1  Microbiology results: 3/30 BCx: NGTD 3/30 MRSA PCR: positive  Thank you for allowing pharmacy to be a part of this patient's care.  Luisa Hart D, PharmD Clinical Pharmacist 10/12/2016 11:06 AM

## 2016-10-12 NOTE — Discharge Summary (Signed)
[facility] [place] [facility]   PATIENT NAME: Alyssa Landry    MR#:  [id number]  DATE OF BIRTH:  02/08/1919  DATE OF ADMISSION:  10/08/2016   ADMITTING PHYSICIAN: Tonye Royalty, DO  DATE OF DISCHARGE: 10/12/2016  PRIMARY CARE PHYSICIAN: Rafael Bihari, MD   ADMISSION DIAGNOSIS:   Community acquired pneumonia of left lung, unspecified part of lung [J18.9]  DISCHARGE DIAGNOSIS:   Active Problems:   HCAP (healthcare-associated pneumonia)   SECONDARY DIAGNOSIS:   Past Medical History:  Diagnosis Date  . A-fib (HCC)   . Acid reflux   . Anemia   . Anxiety   . Arthritis   . Asthma   . Chronic hyponatremia   . COPD (chronic obstructive pulmonary disease) (HCC)   . GERD (gastroesophageal reflux disease)   . Hiatal hernia   . Hiatal hernia   . Hyperlipidemia   . Hypertension   . Hypothyroidism   . Incontinence   . Migraine   . Osteoporosis, post-menopausal   . Persistent cough   . Pulmonary fibrosis (HCC)   . SIADH (syndrome of inappropriate ADH production) (HCC)   . TIA (transient ischemic attack)     HOSPITAL COURSE:   83y/o F with PMH of Afib not on anticoagulation due to hemorrhagic CVA last month, anenmia, arthritis, SIADH with chronic hyponatremia, pulmonary fibrosis on chronic home o2 presents to the hospital secondary to sepsis.  #1 Sepsis- secondary to HCAP due to recent hospitalizations - negative blood cultures, cont o2 support-chronically on 2 L oxygen due to pulmonary fibrosis - monitor wbc, fevers are resolved. - Received cefepime in the hospital and will discharge on Omnicef -Due to ongoing dyspnea, and history of pulmonary fibrosis-steroids added with improvement.  -Continue 12 day prednisone taper at discharge and also nebulizer treatments.  #2 Elevated troponin- demand ischemia, Stable. Patient denies any chest pain. -Appreciate cardiology consult. No further workup recommended  #3 HTN- with sepsis, BP  improved - restarted her home medications  #4 Chronic hyponatremia- with SIADH Monitor closely, lasix, fluid restriction added- of upto 1500 cc/day Continue sodium bicarb tabs  #5 Chronic afib- rate controlled, cardizem Monitor closely, on asa only for anticoagulation. No other anti coagulants due to recent hemorrhagic stroke  #6 H/o hemorrhagic CVA recently- right eye peripheral vision loss On asa for now, also statin   Physical Therapy consulted. Recommended SNF- will be discharged to [facility] today   DISCHARGE CONDITIONS:   Guarded  CONSULTS OBTAINED:   Treatment Team:  Marcina Millard, MD  DRUG ALLERGIES:   Allergies  Allergen Reactions  . Aspirin Other (See Comments)    Upset stomach with high doses  . Codeine Other (See Comments)    Reaction:  Unknown   . Ditropan [Oxybutynin] Other (See Comments)    Reaction:  Unknown   . Hctz [Hydrochlorothiazide] Other (See Comments)    Reaction:  Causes pts sodium/potassium levels to drop significantly   . Metronidazole Other (See Comments)    Reaction:  Unknown   . Propranolol Other (See Comments)    Reaction:  Unknown   . Tavist [Clemastine] Other (See Comments)    Reaction:  Unknown   . Tramadol Other (See Comments)    Reaction:  Unknown   . Vicodin [Hydrocodone-Acetaminophen] Other (See Comments)    Reaction:  Unknown    DISCHARGE MEDICATIONS:   Allergies as of 10/12/2016      Reactions   Aspirin Other (See Comments)   Upset stomach with  high doses   Codeine Other (See Comments)   Reaction:  Unknown    Ditropan [oxybutynin] Other (See Comments)   Reaction:  Unknown    Hctz [hydrochlorothiazide] Other (See Comments)   Reaction:  Causes pts sodium/potassium levels to drop significantly    Metronidazole Other (See Comments)   Reaction:  Unknown    Propranolol Other (See Comments)   Reaction:  Unknown    Tavist [clemastine] Other (See Comments)   Reaction:  Unknown    Tramadol Other (See  Comments)   Reaction:  Unknown    Vicodin [hydrocodone-acetaminophen] Other (See Comments)   Reaction:  Unknown       Medication List    STOP taking these medications   fluticasone 110 MCG/ACT inhaler Commonly known as:  FLOVENT HFA     TAKE these medications   acetaminophen 650 MG CR tablet Commonly known as:  TYLENOL Take 650 mg by mouth every 8 (eight) hours as needed for pain.   albuterol 108 (90 Base) MCG/ACT inhaler Commonly known as:  PROVENTIL HFA;VENTOLIN HFA Inhale 1-2 puffs into the lungs every 4 (four) hours as needed for wheezing or shortness of breath.   aspirin 81 MG EC tablet Take 1 tablet (81 mg total) by mouth daily.   atorvastatin 40 MG tablet Commonly known as:  LIPITOR Take 1 tablet (40 mg total) by mouth daily at 6 PM.   benzonatate 100 MG capsule Commonly known as:  TESSALON Take 1 capsule (100 mg total) by mouth 3 (three) times daily.   budesonide 0.5 MG/2ML nebulizer solution Commonly known as:  PULMICORT Take 2 mLs (0.5 mg total) by nebulization 2 (two) times daily.   cefdinir 250 MG/5ML suspension Commonly known as:  OMNICEF Take 5 mLs (250 mg total) by mouth 2 (two) times daily. X 7 days   cholecalciferol 1000 units tablet Commonly known as:  VITAMIN D Take 1,000 Units by mouth daily.   cloNIDine 0.1 MG tablet Commonly known as:  CATAPRES Take 0.1 mg by mouth 3 (three) times daily.   diltiazem 120 MG 24 hr capsule Commonly known as:  CARDIZEM CD Take 1 capsule (120 mg total) by mouth daily.   docusate sodium 100 MG capsule Commonly known as:  COLACE Take 100 mg by mouth 2 (two) times daily.   feeding supplement (ENSURE ENLIVE) Liqd Take 237 mLs by mouth daily.   ferrous sulfate 325 (65 FE) MG tablet Take 325 mg by mouth daily.   fluticasone 50 MCG/ACT nasal spray Commonly known as:  FLONASE Place 2 sprays into both nostrils every morning.   furosemide 20 MG tablet Commonly known as:  LASIX Take 1 tablet (20 mg total) by  mouth daily. Start taking on:  10/13/2016 What changed:  when to take this   guaiFENesin-dextromethorphan 100-10 MG/5ML syrup Commonly known as:  ROBITUSSIN DM Take 10 mLs by mouth every 6 (six) hours as needed for cough.   ipratropium-albuterol 0.5-2.5 (3) MG/3ML Soln Commonly known as:  DUONEB Take 3 mLs by nebulization every 6 (six) hours as needed.   levothyroxine 125 MCG tablet Commonly known as:  SYNTHROID, LEVOTHROID Take 125 mcg by mouth daily before breakfast.   LORazepam 0.5 MG tablet Commonly known as:  ATIVAN Take 1 tablet (0.5 mg total) by mouth 3 (three) times daily.   losartan 50 MG tablet Commonly known as:  COZAAR Take 50 mg by mouth daily.   multivitamin-lutein Caps capsule Take 2 capsules by mouth daily.   Oxybutynin Chloride 10 %  Gel Commonly known as:  GELNIQUE Place 1 application onto the skin daily.   polyethylene glycol packet Commonly known as:  MIRALAX / GLYCOLAX Take 8.5-17 g by mouth daily. *Mix in 4-8 ounces of fluid prior to taking*   predniSONE 10 MG (48) Tbpk tablet Commonly known as:  STERAPRED UNI-PAK 48 TAB 6 tabs PO x 2 days 5 tabs PO x 2 days 4 tabs PO x 2 days 3 tabs PO x 3 days 2 tabs PO x 3 days 1 tab PO x 3 days and stop   ranitidine 150 MG tablet Commonly known as:  ZANTAC Take 150 mg by mouth 2 (two) times daily before a meal.   sodium bicarbonate 650 MG tablet Take 325 mg by mouth 4 (four) times daily.   spironolactone 25 MG tablet Commonly known as:  ALDACTONE Take 25 mg by mouth daily.   tiotropium 18 MCG inhalation capsule Commonly known as:  SPIRIVA Place 18 mcg into inhaler and inhale daily.   triamcinolone 0.025 % ointment Commonly known as:  KENALOG Apply 1 application topically 2 (two) times daily.   vitamin B-12 1000 MCG tablet Commonly known as:  CYANOCOBALAMIN Take 1,000 mcg by mouth daily.        DISCHARGE INSTRUCTIONS:   1. PCP follow-up in 1-2 weeks  DIET:   Cardiac diet  ACTIVITY:    Activity as tolerated  OXYGEN:   Home Oxygen: Yes.    Oxygen Delivery: 2 liters/min via Patient connected to nasal cannula oxygen  DISCHARGE LOCATION:   nursing home   If you experience worsening of your admission symptoms, develop shortness of breath, life threatening emergency, suicidal or homicidal thoughts you must seek medical attention immediately by calling 911 or calling your MD immediately  if symptoms less severe.  You Must read complete instructions/literature along with all the possible adverse reactions/side effects for all the Medicines you take and that have been prescribed to you. Take any new Medicines after you have completely understood and accpet all the possible adverse reactions/side effects.   Please note  You were cared for by a hospitalist during your hospital stay. If you have any questions about your discharge medications or the care you received while you were in the hospital after you are discharged, you can call the unit and asked to speak with the hospitalist on call if the hospitalist that took care of you is not available. Once you are discharged, your primary care physician will handle any further medical issues. Please note that NO REFILLS for any discharge medications will be authorized once you are discharged, as it is imperative that you return to your primary care physician (or establish a relationship with a primary care physician if you do not have one) for your aftercare needs so that they can reassess your need for medications and monitor your lab values.    On the day of Discharge:  VITAL SIGNS:   Blood pressure (!) 173/75, pulse 82, temperature 97.6 F (36.4 C), temperature source Oral, resp. rate 17, height 4\' 9"  (1.448 m), weight 54.1 kg (119 lb 3.2 oz), SpO2 100 %.  PHYSICAL EXAMINATION:   GENERAL:  83 y.o.-year-old elderly patient lying in the bed with no acute distress.  EYES: Pupils equal, round, reactive to light and accommodation.  No scleral icterus. Extraocular muscles intact.  HEENT: Head atraumatic, normocephalic. Oropharynx and nasopharynx clear.  NECK:  Supple, no jugular venous distention. No thyroid enlargement, no tenderness.  LUNGS: Normal breath sounds bilaterally, bibasilar  crackles, significantly decreased bibasilar breath sounds and scattered wheezing noted today. No use of accessory muscles of respiration.  CARDIOVASCULAR: S1, S2 normal. No  rubs, or gallops. 3/6 systolic murmur ABDOMEN: Soft, nontender, nondistended. Bowel sounds present. No organomegaly or mass.  EXTREMITIES: No pedal edema, cyanosis, or clubbing.  NEUROLOGIC: Cranial nerves II through XII are intact. Muscle strength 5/5 in all extremities. Sensation intact. Gait not checked. Global weakness noted. PSYCHIATRIC: The patient is alert and oriented x 3.  SKIN: No obvious rash, lesion, or ulcer.     DATA REVIEW:   CBC  Recent Labs Lab 10/10/16 0501  WBC 12.5*  HGB 9.3*  HCT 27.3*  PLT 210    Chemistries   Recent Labs Lab 10/08/16 2106  10/11/16 0440  NA 131*  < > 129*  K 4.9  < > 4.8  CL 93*  < > 97*  CO2 32  < > 29  GLUCOSE 120*  < > 137*  BUN 28*  < > 16  CREATININE 0.64  < > 0.58  CALCIUM 9.2  < > 9.0  AST 20  --   --   ALT 18  --   --   ALKPHOS 86  --   --   BILITOT 0.7  --   --   < > = values in this interval not displayed.   Microbiology Results  Results for orders placed or performed during the hospital encounter of 10/08/16  Blood culture (routine x 2)     Status: None (Preliminary result)   Collection Time: 10/08/16  9:06 PM  Result Value Ref Range Status   Specimen Description BLOOD  R AC  Final   Special Requests BOTTLES DRAWN AEROBIC AND ANAEROBIC BCAV  Final   Culture NO GROWTH 4 DAYS  Final   Report Status PENDING  Incomplete  Blood culture (routine x 2)     Status: None (Preliminary result)   Collection Time: 10/08/16  9:06 PM  Result Value Ref Range Status   Specimen Description BLOOD  R  HAND  Final   Special Requests BOTTLES DRAWN AEROBIC AND ANAEROBIC BCAV  Final   Culture NO GROWTH 4 DAYS  Final   Report Status PENDING  Incomplete  MRSA PCR Screening     Status: Abnormal   Collection Time: 10/09/16  1:37 AM  Result Value Ref Range Status   MRSA by PCR POSITIVE (A) NEGATIVE Final    Comment:        The GeneXpert MRSA Assay (FDA approved for NASAL specimens only), is one component of a comprehensive MRSA colonization surveillance program. It is not intended to diagnose MRSA infection nor to guide or monitor treatment for MRSA infections. RESULT CALLED TO, READ BACK BY AND VERIFIED WITH: Sharyne Richters @ [phone] 10/09/16 by [facility]     RADIOLOGY:  No results found.   Management plans discussed with the patient, family and they are in agreement.  CODE STATUS:     Code Status Orders        Start     Ordered   10/09/16 0133  Do not attempt resuscitation (DNR)  Continuous    Question Answer Comment  In the event of cardiac or respiratory ARREST Do not call a "code blue"   In the event of cardiac or respiratory ARREST Do not perform Intubation, CPR, defibrillation or ACLS   In the event of cardiac or respiratory ARREST Use medication by any route, position, wound care, and other measures to relive  pain and suffering. May use oxygen, suction and manual treatment of airway obstruction as needed for comfort.      10/09/16 0132    Code Status History    Date Active Date Inactive Code Status Order ID Comments User Context   10/09/2016  1:21 AM 10/09/2016  1:32 AM Full Code [id number]  Tonye Royalty, DO ED   09/04/2016  6:54 PM 09/05/2016  6:47 PM DNR [id number]  Adrian Saran, MD Inpatient   08/20/2016  5:11 PM 08/22/2016  6:30 PM DNR [id number]  Altamese Dilling, MD Inpatient   05/01/2016  6:48 PM 05/04/2016  7:55 PM DNR [id number]  Enedina Finner, MD Inpatient   02/18/2016 11:10 PM 02/25/2016  6:31 PM DNR [id number]  Tonye Royalty, DO Inpatient   01/26/2016  9:56 PM 01/30/2016   7:34 PM DNR [id number]  Houston Siren, MD Inpatient   12/11/2015  9:29 PM 12/15/2015  8:13 PM DNR [id number]  Ramonita Lab, MD Inpatient   09/25/2015  4:17 PM 09/26/2015 10:07 PM DNR [id number]  Altamese Dilling, MD ED    Advance Directive Documentation     Most Recent Value  Type of Advance Directive  Out of facility DNR (pink MOST or yellow form)  Pre-existing out of facility DNR order (yellow form or pink MOST form)  -  "MOST" Form in Place?  -      TOTAL TIME TAKING CARE OF THIS PATIENT: 38 minutes.    Enid Baas M.D on 10/12/2016 at 1:06 PM  Between 7am to 6pm - Pager - [phone]  After 6pm go to [email or web address] - Social research officer, government  [facility]  Office  [phone]  CC: Primary care physician; Rafael Bihari, MD   Note: This dictation was prepared with Dragon dictation along with smaller phrase technology. Any transcriptional errors that result from this process are unintentional.

## 2016-10-12 NOTE — Progress Notes (Signed)
qPhysical Therapy Treatment Patient Details Name: Alyssa Landry MRN: [id number] DOB: 02/03/1919 Today's Date: 10/12/2016    History of Present Illness pt was admitted on 10/05/2016 with CC of SOB and PMHx of AFIB, COPD, asthma, and CVA which she was seen most recently on 09/04/2016. She reports living with her daughter. She currently reports using O2 at home all the time, as well used a RW and a stair lift.     PT Comments    Pt is making good progress towards goals and is able to ambulate further distance this date. Uses RW and needs cues for sequencing and mobility. Good endurance with there-ex. All mobility performed on 2L of O2 with sats WNL. Will continue to progress.   Follow Up Recommendations  SNF     Equipment Recommendations  None recommended by PT    Recommendations for Other Services       Precautions / Restrictions Precautions Precautions: Fall Restrictions Weight Bearing Restrictions: No    Mobility  Bed Mobility Overal bed mobility: Needs Assistance Bed Mobility: Supine to Sit     Supine to sit: Min assist     General bed mobility comments: needs cues for scooting out towards EOB. Also needs assist for donning shoes prior to standing  Transfers Overall transfer level: Needs assistance Equipment used: Rolling walker (2 wheeled) Transfers: Sit to/from Stand Sit to Stand: Min guard         General transfer comment: Pt able to stand with upright posture. Slow transfer performed. Used RW for safety  Ambulation/Gait Ambulation/Gait assistance: Architect (Feet): 70 Feet Assistive device: Rolling walker (2 wheeled) Gait Pattern/deviations: Step-to pattern     General Gait Details: short step length and RW used. Pt with slow gait speed, and fatigues with increased distance this session. Cues given for obstacle avoidance. All mobility performed on 2L of O2 with sats WNL   Stairs            Wheelchair Mobility    Modified  Rankin (Stroke Patients Only)       Balance                                            Cognition Arousal/Alertness: Awake/alert Behavior During Therapy: WFL for tasks assessed/performed Overall Cognitive Status: Within Functional Limits for tasks assessed                                        Exercises Other Exercises Other Exercises: Supine ther-ex x 12 reps on B LE including ankle pumps, SLRs, hip abd/add, SAQ, and hip add squeezes. All ther-ex performed with cga and cues for correct technique    General Comments        Pertinent Vitals/Pain Pain Assessment: No/denies pain    Home Living                      Prior Function            PT Goals (current goals can now be found in the care plan section) Acute Rehab PT Goals Patient Stated Goal: to breath easier PT Goal Formulation: With patient Time For Goal Achievement: 10/23/16 Potential to Achieve Goals: Good Progress towards PT goals: Progressing toward goals    Frequency  Min 2X/week      PT Plan Current plan remains appropriate    Co-evaluation             End of Session Equipment Utilized During Treatment: Gait belt;Oxygen Activity Tolerance: Patient limited by fatigue Patient left: in chair;with chair alarm set Nurse Communication: Mobility status PT Visit Diagnosis: Unsteadiness on feet (R26.81);Other abnormalities of gait and mobility (R26.89);Muscle weakness (generalized) (M62.81)     Time: 6578-4696 PT Time Calculation (min) (ACUTE ONLY): 29 min  Charges:  $Gait Training: 8-22 mins $Therapeutic Exercise: 8-22 mins                    G Codes:       [name], PT, DPT [phone]    [name] 10/12/2016, 10:21 AM

## 2016-10-13 LAB — CULTURE, BLOOD (ROUTINE X 2)
CULTURE: NO GROWTH
CULTURE: NO GROWTH

## 2016-11-30 ENCOUNTER — Emergency Department
Admission: EM | Admit: 2016-11-30 | Discharge: 2016-11-30 | Disposition: A | Discharge status: Home / Self Care | Payer: Medicare Other | Attending: Emergency Medicine | Admitting: Emergency Medicine

## 2016-11-30 DIAGNOSIS — R4182 Altered mental status, unspecified: Secondary | ICD-10-CM | POA: Diagnosis present

## 2016-11-30 DIAGNOSIS — J449 Chronic obstructive pulmonary disease, unspecified: Secondary | ICD-10-CM | POA: Insufficient documentation

## 2016-11-30 DIAGNOSIS — R410 Disorientation, unspecified: Secondary | ICD-10-CM | POA: Diagnosis not present

## 2016-11-30 DIAGNOSIS — Z8673 Personal history of transient ischemic attack (TIA), and cerebral infarction without residual deficits: Secondary | ICD-10-CM | POA: Insufficient documentation

## 2016-11-30 DIAGNOSIS — Z79899 Other long term (current) drug therapy: Secondary | ICD-10-CM | POA: Insufficient documentation

## 2016-11-30 DIAGNOSIS — I110 Hypertensive heart disease with heart failure: Secondary | ICD-10-CM | POA: Diagnosis not present

## 2016-11-30 DIAGNOSIS — I5032 Chronic diastolic (congestive) heart failure: Secondary | ICD-10-CM | POA: Insufficient documentation

## 2016-11-30 DIAGNOSIS — J45909 Unspecified asthma, uncomplicated: Secondary | ICD-10-CM | POA: Insufficient documentation

## 2016-11-30 LAB — URINALYSIS, COMPLETE (UACMP) WITH MICROSCOPIC
Bacteria, UA: NONE SEEN
Bilirubin Urine: NEGATIVE
Glucose, UA: NEGATIVE mg/dL
Hgb urine dipstick: NEGATIVE
KETONES UR: NEGATIVE mg/dL
Leukocytes, UA: NEGATIVE
Nitrite: NEGATIVE
PH: 7 (ref 5.0–8.0)
Protein, ur: NEGATIVE mg/dL
SPECIFIC GRAVITY, URINE: 1.009 (ref 1.005–1.030)
SQUAMOUS EPITHELIAL / LPF: NONE SEEN

## 2016-11-30 LAB — COMPREHENSIVE METABOLIC PANEL
ALT: 22 U/L (ref 14–54)
AST: 18 U/L (ref 15–41)
Albumin: 4 g/dL (ref 3.5–5.0)
Alkaline Phosphatase: 85 U/L (ref 38–126)
Anion gap: 5 (ref 5–15)
BILIRUBIN TOTAL: 0.5 mg/dL (ref 0.3–1.2)
BUN: 21 mg/dL — AB (ref 6–20)
CO2: 35 mmol/L — ABNORMAL HIGH (ref 22–32)
CREATININE: 0.72 mg/dL (ref 0.44–1.00)
Calcium: 9.1 mg/dL (ref 8.9–10.3)
Chloride: 90 mmol/L — ABNORMAL LOW (ref 101–111)
GFR calc Af Amer: >60 mL/min (ref >60)
Glucose, Bld: 93 mg/dL (ref 65–99)
Potassium: 4.3 mmol/L (ref 3.5–5.1)
Sodium: 130 mmol/L — ABNORMAL LOW (ref 135–145)
TOTAL PROTEIN: 7 g/dL (ref 6.5–8.1)

## 2016-11-30 LAB — CBC
HCT: 30.3 % — ABNORMAL LOW (ref 35.0–47.0)
Hemoglobin: 10.3 g/dL — ABNORMAL LOW (ref 12.0–16.0)
MCH: 29.6 pg (ref 26.0–34.0)
MCHC: 33.9 g/dL (ref 32.0–36.0)
MCV: 87.2 fL (ref 80.0–100.0)
PLATELETS: 260 10*3/uL (ref 150–440)
RBC: 3.48 MIL/uL — ABNORMAL LOW (ref 3.80–5.20)
RDW: 14.9 % — AB (ref 11.5–14.5)
WBC: 12.6 10*3/uL — AB (ref 3.6–11.0)

## 2016-11-30 LAB — TROPONIN I
TROPONIN I: 0.03 ng/mL — AB (ref <0.03)
Troponin I: 0.04 ng/mL (ref <0.03)

## 2016-11-30 NOTE — ED Provider Notes (Signed)
[facility] Emergency Department Provider Note  ____________________________________________  Time seen: Approximately 3:44 PM  I have reviewed the triage vital signs and the nursing notes.   HISTORY  Chief Complaint Altered Mental Status   HPI Alyssa Landry is a 83 y.o. female with a history of SIADH, hemorrhagic stroke, A. fib, anemia, COPD, hypertension, hyperlipidemia, hypothyroidism who presents for evaluation of confusion. According to patient's daughter, patient came to the table this afternoon for lunch and was acting oddly. She kept palpating things on the table,moving her napkin to different places, when daughter asked her where her spleen was patient kept palpating the table in front of her sedative just looking for the spoon. The episode was short-lived but the daughter was concerned enough to bring her to the emergency room especially since patient has had similar behavior in the past in the setting of hyponatremia. Patient does remember sitting down to lunch and does not remember acting oddly. Patient denies headache, chest pain, shortness of breath, nausea or vomiting, changes in her vision, weakness or numbness, difficulty finding words, abdominal pain, diarrhea, dysuria. Daughter did not notice any neurological deficits during that episode. Patient is back to her baseline according to the daughter.  Past Medical History:  Diagnosis Date  . A-fib (HCC)   . Acid reflux   . Anemia   . Anxiety   . Arthritis   . Asthma   . Chronic hyponatremia   . COPD (chronic obstructive pulmonary disease) (HCC)   . GERD (gastroesophageal reflux disease)   . Hiatal hernia   . Hiatal hernia   . Hyperlipidemia   . Hypertension   . Hypothyroidism   . Incontinence   . Migraine   . Osteoporosis, post-menopausal   . Persistent cough   . Pulmonary fibrosis (HCC)   . SIADH (syndrome of inappropriate ADH production) (HCC)   . TIA (transient ischemic attack)       Patient Active Problem List   Diagnosis Date Noted  . HCAP (healthcare-associated pneumonia) 10/08/2016  . TIA (transient ischemic attack) 09/04/2016  . Stroke (HCC) 08/20/2016  . CHF (congestive heart failure) (HCC) 05/01/2016  . Chronic diastolic heart failure (HCC) 04/16/2016  . HTN (hypertension) 04/16/2016  . Hypoxia   . SOB (shortness of breath)   . Collapse of left lung   . Pleural effusion   . Centrilobular emphysema (HCC)   . Hyponatremia   . Pressure ulcer 02/19/2016  . Atrial fibrillation with RVR (HCC) 12/11/2015  . Urge incontinence 09/26/2015  . Lichenified rash 09/26/2015  . Syncopal episodes 09/25/2015    Past Surgical History:  Procedure Laterality Date  . ABDOMINAL HYSTERECTOMY  1966    Prior to Admission medications   Medication Sig Start Date End Date Taking? Authorizing Provider  fluticasone (FLOVENT HFA) 110 MCG/ACT inhaler Inhale 2 puffs into the lungs 2 (two) times daily. 11/13/16 11/13/17 Yes [provider]  acetaminophen (TYLENOL) 650 MG CR tablet Take 650 mg by mouth every 8 (eight) hours as needed for pain.    [provider]  albuterol (PROVENTIL HFA;VENTOLIN HFA) 108 (90 BASE) MCG/ACT inhaler Inhale 1-2 puffs into the lungs every 4 (four) hours as needed for wheezing or shortness of breath.    [provider]  aspirin EC 81 MG EC tablet Take 1 tablet (81 mg total) by mouth daily. 09/06/16   Milagros Loll, MD  atorvastatin (LIPITOR) 40 MG tablet Take 1 tablet (40 mg total) by mouth daily at 6 PM. 08/22/16  Enid BaasKalisetti, Radhika, MD  benzonatate (TESSALON) 100 MG capsule Take 1 capsule (100 mg total) by mouth 3 (three) times daily. 10/12/16   Enid BaasKalisetti, Radhika, MD  budesonide (PULMICORT) 0.5 MG/2ML nebulizer solution Take 2 mLs (0.5 mg total) by nebulization 2 (two) times daily. 10/12/16   Enid BaasKalisetti, Radhika, MD  cefdinir (OMNICEF) 250 MG/5ML suspension Take 5 mLs (250 mg total) by mouth 2 (two) times daily. X 7 days 10/12/16    Enid BaasKalisetti, Radhika, MD  cholecalciferol (VITAMIN D) 1000 UNITS tablet Take 1,000 Units by mouth daily.    [provider]  cloNIDine (CATAPRES) 0.1 MG tablet Take 0.1 mg by mouth 3 (three) times daily.    [provider]  diltiazem (CARDIZEM CD) 120 MG 24 hr capsule Take 1 capsule (120 mg total) by mouth daily. 12/15/15   Hower, Cletis Athensavid K, MD  docusate sodium (COLACE) 100 MG capsule Take 100 mg by mouth 2 (two) times daily.     [provider]  feeding supplement, ENSURE ENLIVE, (ENSURE ENLIVE) LIQD Take 237 mLs by mouth daily.     [provider]  ferrous sulfate 325 (65 FE) MG tablet Take 325 mg by mouth daily.     [provider]  fluticasone (FLONASE) 50 MCG/ACT nasal spray Place 2 sprays into both nostrils every morning.     [provider]  furosemide (LASIX) 20 MG tablet Take 1 tablet (20 mg total) by mouth daily. 10/13/16   Enid BaasKalisetti, Radhika, MD  guaiFENesin-dextromethorphan (ROBITUSSIN DM) 100-10 MG/5ML syrup Take 10 mLs by mouth every 6 (six) hours as needed for cough. Patient not taking: Reported on 09/04/2016 05/04/16   Ramonita LabGouru, Aruna, MD  ipratropium-albuterol (DUONEB) 0.5-2.5 (3) MG/3ML SOLN Take 3 mLs by nebulization every 6 (six) hours as needed. 10/12/16   Enid BaasKalisetti, Radhika, MD  levothyroxine (SYNTHROID, LEVOTHROID) 125 MCG tablet Take 125 mcg by mouth daily before breakfast.    [provider]  LORazepam (ATIVAN) 0.5 MG tablet Take 1 tablet (0.5 mg total) by mouth 3 (three) times daily. 10/12/16   Enid BaasKalisetti, Radhika, MD  losartan (COZAAR) 50 MG tablet Take 50 mg by mouth daily.    [provider]  multivitamin-lutein (OCUVITE-LUTEIN) CAPS capsule Take 2 capsules by mouth daily.    [provider]  Oxybutynin Chloride (GELNIQUE) 10 % GEL Place 1 application onto the skin daily. 05/25/16   Michiel CowboyMcGowan, Shannon A, PA-C  polyethylene glycol (MIRALAX / GLYCOLAX) packet Take 8.5-17 g by mouth daily. *Mix in 4-8 ounces of  fluid prior to taking*    [provider]  predniSONE (STERAPRED UNI-PAK 48 TAB) 10 MG (48) TBPK tablet 6 tabs PO x 2 days 5 tabs PO x 2 days 4 tabs PO x 2 days 3 tabs PO x 3 days 2 tabs PO x 3 days 1 tab PO x 3 days and stop 10/12/16   Enid BaasKalisetti, Radhika, MD  ranitidine (ZANTAC) 150 MG tablet Take 150 mg by mouth 2 (two) times daily before a meal.     [provider]  sodium bicarbonate 650 MG tablet Take 325 mg by mouth 4 (four) times daily.    [provider]  spironolactone (ALDACTONE) 25 MG tablet Take 25 mg by mouth daily.    [provider]  tiotropium (SPIRIVA) 18 MCG inhalation capsule Place 18 mcg into inhaler and inhale daily.    [provider]  triamcinolone (KENALOG) 0.025 % ointment Apply 1 application topically 2 (two) times daily. 09/22/16   Harle BattiestMcGowan, Shannon A, PA-C  vitamin B-12 (CYANOCOBALAMIN) 1000 MCG tablet Take 1,000 mcg by mouth daily.    [provider]    Allergies Aspirin; Codeine; Ditropan [oxybutynin]; Hctz [hydrochlorothiazide]; Metronidazole; Propranolol; Tavist [clemastine]; Tramadol; and Vicodin [hydrocodone-acetaminophen]  Family History  Problem Relation Age of Onset  . Leukemia Mother   . Heart disease Father   . Hypertension Unknown     Social History Social History  Substance Use Topics  . Smoking status: Never Smoker  . Smokeless tobacco: Never Used  . Alcohol use No    Review of Systems  Constitutional: Negative for fever. + confusion Eyes: Negative for visual changes. ENT: Negative for sore throat. Neck: No neck pain  Cardiovascular: Negative for chest pain. Respiratory: Negative for shortness of breath. Gastrointestinal: Negative for abdominal pain, vomiting or diarrhea. Genitourinary: Negative for dysuria. Musculoskeletal: Negative for back pain. Skin: Negative for rash. Neurological: Negative for headaches, weakness or numbness. Psych: No SI or  HI  ____________________________________________   PHYSICAL EXAM:  VITAL SIGNS: ED Triage Vitals  Enc Vitals Group     BP 11/30/16 1427 (!) 127/96     Pulse Rate 11/30/16 1431 64     Resp 11/30/16 1427 18     Temp 11/30/16 1427 98.1 F (36.7 C)     Temp Source 11/30/16 1427 Oral     SpO2 11/30/16 1431 100 %     Weight --      Height --      Head Circumference --      Peak Flow --      Pain Score --      Pain Loc --      Pain Edu? --      Excl. in GC? --     Constitutional: Alert and oriented. Well appearing and in no apparent distress. HEENT:      Head: Normocephalic and atraumatic.         Eyes: Conjunctivae are normal. Sclera is non-icteric.       Mouth/Throat: Mucous membranes are moist.       Neck: Supple with no signs of meningismus. Cardiovascular: Regular rate and rhythm. No murmurs, gallops, or rubs. 2+ symmetrical distal pulses are present in all extremities. No JVD. Respiratory: Normal respiratory effort. Lungs are clear to auscultation bilaterally. No wheezes, crackles, or rhonchi.  Gastrointestinal: Soft, non tender, and non distended with positive bowel sounds. No rebound or guarding. Genitourinary: No CVA tenderness. Musculoskeletal: Nontender with normal range of motion in all extremities. No edema, cyanosis, or erythema of extremities. Neurologic: Normal speech and language. A & O x3, PERRL, no nystagmus, CN II-XII intact, motor testing reveals good tone and bulk throughout. There is no evidence of pronator drift or dysmetria. Muscle strength is 5/5 throughout. Deep tendon reflexes are 2+ throughout with downgoing toes. Sensory examination is intact. Gait deferred Skin: Skin is warm, dry and intact. No rash noted. Psychiatric: Mood and affect are normal. Speech and behavior are normal.  ____________________________________________   LABS (all labs ordered are listed, but only abnormal results are displayed)  Labs Reviewed  COMPREHENSIVE METABOLIC PANEL  - Abnormal; Notable for the following:       Result Value   Sodium 130 (*)    Chloride 90 (*)    CO2 35 (*)    BUN 21 (*)    All other components within normal limits  CBC - Abnormal; Notable for the following:    WBC 12.6 (*)    RBC 3.48 (*)    Hemoglobin 10.3 (*)  HCT 30.3 (*)    RDW 14.9 (*)    All other components within normal limits  TROPONIN I - Abnormal; Notable for the following:    Troponin I 0.03 (*)    All other components within normal limits  URINALYSIS, COMPLETE (UACMP) WITH MICROSCOPIC - Abnormal; Notable for the following:    Color, Urine YELLOW (*)    APPearance HAZY (*)    All other components within normal limits  TROPONIN I - Abnormal; Notable for the following:    Troponin I 0.04 (*)    All other components within normal limits   ____________________________________________  EKG  ED ECG REPORT I, Nita Sickle, the attending physician, personally viewed and interpreted this ECG.  Atrial fibrillation, rate of 71, normal QRS and QTc intervals, left axis deviation, no ST elevations or depressions, T-wave inversions in lateral leads which is new. ____________________________________________  RADIOLOGY  none  ____________________________________________   PROCEDURES  Procedure(s) performed: None Procedures Critical Care performed:  None ____________________________________________   INITIAL IMPRESSION / ASSESSMENT AND PLAN / ED COURSE  83 y.o. female with a history of SIADH, hemorrhagic stroke, A. fib, anemia, COPD, hypertension, hyperlipidemia, hypothyroidism who presents for evaluation of a short-lived episode of confusion earlier today. Patient is back to her baseline, she is neurologically intact, she has no complaints. Her vitals are within normal limits. She is completely neurologically intact. Na 130 which is patient's baseline. Patient's EKG shows new T-wave inversions in lateral leads. Patient has no chest pain or shortness of breath.  Troponin is pending.    _________________________ 7:33 PM on 11/30/2016 -----------------------------------------  Labs and urine with no acute findings. Patient remains at baseline. Will dc home at this time and recommend close f/u with PCP.  Pertinent labs & imaging results that were available during my care of the patient were reviewed by me and considered in my medical decision making (see chart for details).    ____________________________________________   FINAL CLINICAL IMPRESSION(S) / ED DIAGNOSES  Final diagnoses:  Confusion      NEW MEDICATIONS STARTED DURING THIS VISIT:  New Prescriptions   No medications on file     Note:  This document was prepared using Dragon voice recognition software and may include unintentional dictation errors.    Don Perking [place], MD 11/30/16 [phone]

## 2016-11-30 NOTE — ED Triage Notes (Signed)
Pt arrives to ER via [facility] from home c/o altered mental status. Pt became confused today around 1230 and had trouble following EMS. Pt had negative stroke screen. VSS and CBG normal. Pt wears oxygen at baseline, 2L [place].

## 2016-11-30 NOTE — ED Notes (Signed)

## 2017-01-22 ENCOUNTER — Ambulatory Visit
Admission: RE | Admit: 2017-01-22 | Discharge: 2017-01-22 | Disposition: A | Discharge status: Home / Self Care | Payer: Medicare Other | Source: Ambulatory Visit | Attending: Physician Assistant | Admitting: Physician Assistant

## 2017-01-22 ENCOUNTER — Other Ambulatory Visit: Payer: Self-pay | Admitting: Physician Assistant

## 2017-01-22 DIAGNOSIS — R519 Headache, unspecified: Secondary | ICD-10-CM

## 2017-01-22 DIAGNOSIS — I639 Cerebral infarction, unspecified: Secondary | ICD-10-CM | POA: Insufficient documentation

## 2017-01-22 DIAGNOSIS — W19XXXA Unspecified fall, initial encounter: Secondary | ICD-10-CM

## 2017-01-22 DIAGNOSIS — R51 Headache: Secondary | ICD-10-CM | POA: Diagnosis present

## 2017-01-26 ENCOUNTER — Telehealth: Payer: Self-pay | Admitting: Urology

## 2017-01-26 DIAGNOSIS — N3281 Overactive bladder: Secondary | ICD-10-CM

## 2017-01-26 NOTE — Telephone Encounter (Signed)
Refill the Gelnique and she will probability need a PA.

## 2017-01-26 NOTE — Telephone Encounter (Signed)
Please advise on the Gelnique.

## 2017-01-26 NOTE — Telephone Encounter (Signed)
Pt needs a refill for Gelnique topical gel.  Carollee HerterShannon always has to do something special, maybe a prior auth. Please call pt's daughter [name] [phone]

## 2017-01-27 MED ORDER — OXYBUTYNIN CHLORIDE 10 % TD GEL: 1.0000 | Freq: Every day | TRANSDERMAL | 6 refills | Status: AC

## 2017-01-27 NOTE — Telephone Encounter (Signed)
RX was sent to pharmacy, patient notified.

## 2017-01-28 ENCOUNTER — Emergency Department: Payer: Medicare Other

## 2017-01-28 ENCOUNTER — Encounter: Payer: Self-pay | Admitting: *Deleted

## 2017-01-28 ENCOUNTER — Inpatient Hospital Stay
Admission: EM | Admit: 2017-01-28 | Discharge: 2017-01-31 | DRG: 871 | Disposition: A | Discharge status: Home Health | Payer: Medicare Other | Attending: Internal Medicine | Admitting: Internal Medicine

## 2017-01-28 DIAGNOSIS — Z9981 Dependence on supplemental oxygen: Secondary | ICD-10-CM

## 2017-01-28 DIAGNOSIS — J8410 Pulmonary fibrosis, unspecified: Secondary | ICD-10-CM | POA: Diagnosis present

## 2017-01-28 DIAGNOSIS — Z888 Allergy status to other drugs, medicaments and biological substances status: Secondary | ICD-10-CM

## 2017-01-28 DIAGNOSIS — E785 Hyperlipidemia, unspecified: Secondary | ICD-10-CM | POA: Diagnosis present

## 2017-01-28 DIAGNOSIS — M810 Age-related osteoporosis without current pathological fracture: Secondary | ICD-10-CM | POA: Diagnosis present

## 2017-01-28 DIAGNOSIS — Z885 Allergy status to narcotic agent status: Secondary | ICD-10-CM | POA: Diagnosis not present

## 2017-01-28 DIAGNOSIS — A419 Sepsis, unspecified organism: Principal | ICD-10-CM | POA: Diagnosis present

## 2017-01-28 DIAGNOSIS — E875 Hyperkalemia: Secondary | ICD-10-CM | POA: Diagnosis present

## 2017-01-28 DIAGNOSIS — I10 Essential (primary) hypertension: Secondary | ICD-10-CM | POA: Diagnosis present

## 2017-01-28 DIAGNOSIS — R531 Weakness: Secondary | ICD-10-CM

## 2017-01-28 DIAGNOSIS — K219 Gastro-esophageal reflux disease without esophagitis: Secondary | ICD-10-CM | POA: Diagnosis present

## 2017-01-28 DIAGNOSIS — E871 Hypo-osmolality and hyponatremia: Secondary | ICD-10-CM | POA: Diagnosis not present

## 2017-01-28 DIAGNOSIS — Z66 Do not resuscitate: Secondary | ICD-10-CM | POA: Diagnosis present

## 2017-01-28 DIAGNOSIS — J181 Lobar pneumonia, unspecified organism: Secondary | ICD-10-CM | POA: Diagnosis present

## 2017-01-28 DIAGNOSIS — I5032 Chronic diastolic (congestive) heart failure: Secondary | ICD-10-CM | POA: Diagnosis present

## 2017-01-28 DIAGNOSIS — E039 Hypothyroidism, unspecified: Secondary | ICD-10-CM | POA: Diagnosis present

## 2017-01-28 DIAGNOSIS — Z886 Allergy status to analgesic agent status: Secondary | ICD-10-CM | POA: Diagnosis not present

## 2017-01-28 DIAGNOSIS — F419 Anxiety disorder, unspecified: Secondary | ICD-10-CM | POA: Diagnosis present

## 2017-01-28 DIAGNOSIS — J189 Pneumonia, unspecified organism: Secondary | ICD-10-CM

## 2017-01-28 DIAGNOSIS — R748 Abnormal levels of other serum enzymes: Secondary | ICD-10-CM | POA: Diagnosis present

## 2017-01-28 DIAGNOSIS — Z8673 Personal history of transient ischemic attack (TIA), and cerebral infarction without residual deficits: Secondary | ICD-10-CM | POA: Diagnosis not present

## 2017-01-28 DIAGNOSIS — I110 Hypertensive heart disease with heart failure: Secondary | ICD-10-CM | POA: Diagnosis present

## 2017-01-28 DIAGNOSIS — J440 Chronic obstructive pulmonary disease with acute lower respiratory infection: Secondary | ICD-10-CM | POA: Diagnosis present

## 2017-01-28 DIAGNOSIS — I4891 Unspecified atrial fibrillation: Secondary | ICD-10-CM | POA: Diagnosis present

## 2017-01-28 LAB — BASIC METABOLIC PANEL
Anion gap: 13 (ref 5–15)
BUN: 15 mg/dL (ref 6–20)
CHLORIDE: 84 mmol/L — AB (ref 101–111)
CO2: 27 mmol/L (ref 22–32)
Calcium: 9.5 mg/dL (ref 8.9–10.3)
Creatinine, Ser: 0.56 mg/dL (ref 0.44–1.00)
GFR calc Af Amer: >60 mL/min (ref >60)
GFR calc non Af Amer: >60 mL/min (ref >60)
GLUCOSE: 188 mg/dL — AB (ref 65–99)
POTASSIUM: 4.7 mmol/L (ref 3.5–5.1)
Sodium: 124 mmol/L — ABNORMAL LOW (ref 135–145)

## 2017-01-28 LAB — URINALYSIS, COMPLETE (UACMP) WITH MICROSCOPIC
Bacteria, UA: NONE SEEN
Bilirubin Urine: NEGATIVE
GLUCOSE, UA: NEGATIVE mg/dL
Hgb urine dipstick: NEGATIVE
KETONES UR: 5 mg/dL — AB
Leukocytes, UA: NEGATIVE
NITRITE: NEGATIVE
PROTEIN: NEGATIVE mg/dL
Specific Gravity, Urine: 1.011 (ref 1.005–1.030)
pH: 7 (ref 5.0–8.0)

## 2017-01-28 LAB — TROPONIN I
TROPONIN I: 0.04 ng/mL — AB (ref <0.03)
Troponin I: 0.04 ng/mL (ref <0.03)

## 2017-01-28 LAB — COMPREHENSIVE METABOLIC PANEL
ALK PHOS: 98 U/L (ref 38–126)
ALT: 14 U/L (ref 14–54)
ANION GAP: 11 (ref 5–15)
AST: 29 U/L (ref 15–41)
Albumin: 4.5 g/dL (ref 3.5–5.0)
BUN: 14 mg/dL (ref 6–20)
CALCIUM: 9.9 mg/dL (ref 8.9–10.3)
CO2: 32 mmol/L (ref 22–32)
Chloride: 83 mmol/L — ABNORMAL LOW (ref 101–111)
Creatinine, Ser: 0.54 mg/dL (ref 0.44–1.00)
GFR calc Af Amer: >60 mL/min (ref >60)
Glucose, Bld: 104 mg/dL — ABNORMAL HIGH (ref 65–99)
POTASSIUM: 5.6 mmol/L — AB (ref 3.5–5.1)
Sodium: 126 mmol/L — ABNORMAL LOW (ref 135–145)
Total Bilirubin: 0.8 mg/dL (ref 0.3–1.2)
Total Protein: 7.8 g/dL (ref 6.5–8.1)

## 2017-01-28 LAB — LACTIC ACID, PLASMA
Lactic Acid, Venous: 2 mmol/L (ref 0.5–1.9)
Lactic Acid, Venous: 1.9 mmol/L (ref 0.5–1.9)

## 2017-01-28 LAB — CBC WITH DIFFERENTIAL/PLATELET
Basophils Absolute: 0.1 10*3/uL (ref 0–0.1)
Basophils Relative: 1 %
Eosinophils Absolute: 0.2 10*3/uL (ref 0–0.7)
Eosinophils Relative: 1 %
HEMATOCRIT: 33.9 % — AB (ref 35.0–47.0)
HEMOGLOBIN: 11.5 g/dL — AB (ref 12.0–16.0)
LYMPHS ABS: 1.2 10*3/uL (ref 1.0–3.6)
LYMPHS PCT: 9 %
MCH: 29.2 pg (ref 26.0–34.0)
MCHC: 34 g/dL (ref 32.0–36.0)
MCV: 85.9 fL (ref 80.0–100.0)
MONO ABS: 1.4 10*3/uL — AB (ref 0.2–0.9)
Monocytes Relative: 10 %
NEUTROS ABS: 10.9 10*3/uL — AB (ref 1.4–6.5)
Neutrophils Relative %: 79 %
Platelets: 302 10*3/uL (ref 150–440)
RBC: 3.95 MIL/uL (ref 3.80–5.20)
RDW: 13.9 % (ref 11.5–14.5)
WBC: 13.7 10*3/uL — ABNORMAL HIGH (ref 3.6–11.0)

## 2017-01-28 LAB — PROCALCITONIN

## 2017-01-28 LAB — PROTIME-INR
INR: 1.11
PROTHROMBIN TIME: 14.3 s (ref 11.4–15.2)

## 2017-01-28 LAB — APTT: APTT: 44 s — AB (ref 24–36)

## 2017-01-28 LAB — MRSA PCR SCREENING: MRSA by PCR: POSITIVE — AB

## 2017-01-28 MED ORDER — DEXTROSE 5 % IV SOLN
1.0000 g | INTRAVENOUS | Status: DC
  Administered 2017-01-29: 09:00:00 1 g via INTRAVENOUS
  Filled 2017-01-28: qty 10

## 2017-01-28 MED ORDER — DEXTROSE 5 % IV SOLN: 500.0000 mg | Freq: Once | INTRAVENOUS | Status: DC

## 2017-01-28 MED ORDER — FERROUS SULFATE 325 (65 FE) MG PO TABS
325.0000 mg | ORAL_TABLET | Freq: Every day | ORAL | Status: DC
  Administered 2017-01-29 – 2017-01-31 (×3): 325 mg via ORAL
  Filled 2017-01-28 (×3): qty 1

## 2017-01-28 MED ORDER — DILTIAZEM HCL ER COATED BEADS 120 MG PO CP24
120.0000 mg | ORAL_CAPSULE | Freq: Every day | ORAL | Status: DC
  Administered 2017-01-28 – 2017-01-31 (×4): 120 mg via ORAL
  Filled 2017-01-28 (×4): qty 1

## 2017-01-28 MED ORDER — ONDANSETRON HCL 4 MG/2ML IJ SOLN: 4.0000 mg | Freq: Four times a day (QID) | INTRAMUSCULAR | Status: DC | PRN

## 2017-01-28 MED ORDER — IPRATROPIUM-ALBUTEROL 0.5-2.5 (3) MG/3ML IN SOLN
3.0000 mL | Freq: Once | RESPIRATORY_TRACT | Status: AC
  Administered 2017-01-28: 3 mL via RESPIRATORY_TRACT
  Filled 2017-01-28: qty 3

## 2017-01-28 MED ORDER — METHYLPREDNISOLONE SODIUM SUCC 125 MG IJ SOLR
125.0000 mg | Freq: Once | INTRAMUSCULAR | Status: AC
  Administered 2017-01-28: 125 mg via INTRAVENOUS
  Filled 2017-01-28: qty 2

## 2017-01-28 MED ORDER — ENSURE ENLIVE PO LIQD
237.0000 mL | Freq: Every day | ORAL | Status: DC
  Administered 2017-01-28 – 2017-01-31 (×4): 237 mL via ORAL

## 2017-01-28 MED ORDER — SENNOSIDES-DOCUSATE SODIUM 8.6-50 MG PO TABS: 1.0000 | ORAL_TABLET | Freq: Every evening | ORAL | Status: DC | PRN

## 2017-01-28 MED ORDER — BENZONATATE 100 MG PO CAPS: 100.0000 mg | ORAL_CAPSULE | Freq: Three times a day (TID) | ORAL | Status: DC

## 2017-01-28 MED ORDER — ASPIRIN EC 81 MG PO TBEC
81.0000 mg | DELAYED_RELEASE_TABLET | Freq: Every day | ORAL | Status: DC
  Administered 2017-01-29 – 2017-01-31 (×3): 81 mg via ORAL
  Filled 2017-01-28 (×3): qty 1

## 2017-01-28 MED ORDER — DEXTROSE 5 % IV SOLN
500.0000 mg | INTRAVENOUS | Status: DC
  Administered 2017-01-29: 11:00:00 500 mg via INTRAVENOUS
  Filled 2017-01-28: qty 500

## 2017-01-28 MED ORDER — DEXTROSE 5 % IV SOLN: 1.0000 g | Freq: Once | INTRAVENOUS | Status: DC

## 2017-01-28 MED ORDER — ONDANSETRON HCL 4 MG PO TABS: 4.0000 mg | ORAL_TABLET | Freq: Four times a day (QID) | ORAL | Status: DC | PRN

## 2017-01-28 MED ORDER — FAMOTIDINE 20 MG PO TABS
20.0000 mg | ORAL_TABLET | Freq: Every day | ORAL | Status: DC
Start: 2017-01-28 — End: 2017-01-31
  Administered 2017-01-28 – 2017-01-31 (×4): 20 mg via ORAL
  Filled 2017-01-28 (×4): qty 1

## 2017-01-28 MED ORDER — SODIUM CHLORIDE 0.9 % IV SOLN
INTRAVENOUS | Status: DC
  Administered 2017-01-28: 18:00:00 via INTRAVENOUS

## 2017-01-28 MED ORDER — ALBUTEROL SULFATE (2.5 MG/3ML) 0.083% IN NEBU
2.5000 mg | INHALATION_SOLUTION | RESPIRATORY_TRACT | Status: DC | PRN
  Administered 2017-01-28 – 2017-01-30 (×4): 2.5 mg via RESPIRATORY_TRACT
  Filled 2017-01-28 (×4): qty 3

## 2017-01-28 MED ORDER — GUAIFENESIN-DM 100-10 MG/5ML PO SYRP
10.0000 mL | ORAL_SOLUTION | Freq: Four times a day (QID) | ORAL | Status: DC | PRN
  Filled 2017-01-28: qty 10

## 2017-01-28 MED ORDER — SODIUM CHLORIDE 0.9 % IV SOLN
Freq: Once | INTRAVENOUS | Status: AC
  Administered 2017-01-28: 12:00:00 via INTRAVENOUS

## 2017-01-28 MED ORDER — LORAZEPAM 0.5 MG PO TABS
0.5000 mg | ORAL_TABLET | Freq: Three times a day (TID) | ORAL | Status: DC
  Administered 2017-01-28 – 2017-01-31 (×9): 0.5 mg via ORAL
  Filled 2017-01-28 (×9): qty 1

## 2017-01-28 MED ORDER — LEVOFLOXACIN IN D5W 750 MG/150ML IV SOLN
750.0000 mg | Freq: Once | INTRAVENOUS | Status: AC
  Administered 2017-01-28: 750 mg via INTRAVENOUS
  Filled 2017-01-28: qty 150

## 2017-01-28 MED ORDER — TIOTROPIUM BROMIDE MONOHYDRATE 18 MCG IN CAPS
18.0000 ug | ORAL_CAPSULE | Freq: Every day | RESPIRATORY_TRACT | Status: DC
  Administered 2017-01-28 – 2017-01-31 (×4): 18 ug via RESPIRATORY_TRACT
  Filled 2017-01-28: qty 5

## 2017-01-28 MED ORDER — ATORVASTATIN CALCIUM 20 MG PO TABS
40.0000 mg | ORAL_TABLET | Freq: Every day | ORAL | Status: DC
  Administered 2017-01-28 – 2017-01-30 (×3): 40 mg via ORAL
  Filled 2017-01-28 (×3): qty 2

## 2017-01-28 MED ORDER — MUPIROCIN 2 % EX OINT
1.0000 "application " | TOPICAL_OINTMENT | Freq: Two times a day (BID) | CUTANEOUS | Status: DC
  Administered 2017-01-28 – 2017-01-31 (×6): 1 via NASAL
  Filled 2017-01-28: qty 22

## 2017-01-28 MED ORDER — SODIUM BICARBONATE 650 MG PO TABS
325.0000 mg | ORAL_TABLET | Freq: Four times a day (QID) | ORAL | Status: DC
  Administered 2017-01-28 – 2017-01-31 (×11): 325 mg via ORAL
  Filled 2017-01-28 (×15): qty 0.5

## 2017-01-28 MED ORDER — POLYETHYLENE GLYCOL 3350 17 G PO PACK
8.5000 g | PACK | Freq: Every day | ORAL | Status: DC
Start: 2017-01-28 — End: 2017-01-31
  Administered 2017-01-29 – 2017-01-31 (×3): 17 g via ORAL
  Filled 2017-01-28 (×3): qty 1

## 2017-01-28 MED ORDER — BUDESONIDE 0.25 MG/2ML IN SUSP
0.2500 mg | Freq: Two times a day (BID) | RESPIRATORY_TRACT | Status: DC
  Administered 2017-01-28 – 2017-01-31 (×6): 0.25 mg via RESPIRATORY_TRACT
  Filled 2017-01-28 (×6): qty 2

## 2017-01-28 MED ORDER — ENOXAPARIN SODIUM 30 MG/0.3ML ~~LOC~~ SOLN
30.0000 mg | SUBCUTANEOUS | Status: DC
Start: 2017-01-28 — End: 2017-01-31
  Administered 2017-01-28 – 2017-01-30 (×3): 30 mg via SUBCUTANEOUS
  Filled 2017-01-28 (×3): qty 0.3

## 2017-01-28 MED ORDER — SODIUM POLYSTYRENE SULFONATE 15 GM/60ML PO SUSP
30.0000 g | Freq: Once | ORAL | Status: AC
  Administered 2017-01-28: 18:00:00 30 g via ORAL
  Filled 2017-01-28: qty 120

## 2017-01-28 MED ORDER — LEVOTHYROXINE SODIUM 25 MCG PO TABS
125.0000 ug | ORAL_TABLET | Freq: Every day | ORAL | Status: DC
  Filled 2017-01-28: qty 1

## 2017-01-28 MED ORDER — FLUTICASONE PROPIONATE HFA 110 MCG/ACT IN AERO: 2.0000 | INHALATION_SPRAY | Freq: Two times a day (BID) | RESPIRATORY_TRACT | Status: DC

## 2017-01-28 MED ORDER — CHLORHEXIDINE GLUCONATE CLOTH 2 % EX PADS
6.0000 | MEDICATED_PAD | Freq: Every day | CUTANEOUS | Status: DC
  Administered 2017-01-29 – 2017-01-31 (×3): 6 via TOPICAL

## 2017-01-28 MED ORDER — DOCUSATE SODIUM 100 MG PO CAPS
100.0000 mg | ORAL_CAPSULE | Freq: Two times a day (BID) | ORAL | Status: DC
  Administered 2017-01-28 – 2017-01-31 (×6): 100 mg via ORAL
  Filled 2017-01-28 (×6): qty 1

## 2017-01-28 MED ORDER — CLONIDINE HCL 0.1 MG PO TABS
0.1000 mg | ORAL_TABLET | Freq: Three times a day (TID) | ORAL | Status: DC
  Administered 2017-01-28 – 2017-01-31 (×9): 0.1 mg via ORAL
  Filled 2017-01-28 (×9): qty 1

## 2017-01-28 NOTE — H&P (Signed)
[employer] [facility] [place] [facility]   PATIENT NAME: Alyssa Landry    MR#:  [id number]  DATE OF BIRTH:  1919/06/30  DATE OF ADMISSION:  01/28/2017  PRIMARY CARE PHYSICIAN: Rafael Bihari, MD   REQUESTING/REFERRING PHYSICIAN: Emily Filbert, MD  CHIEF COMPLAINT:   Chief Complaint  Patient presents with  . Weakness   Generalized weakness. HISTORY OF PRESENT ILLNESS:  Alyssa Landry  is a 83 y.o. female with a known history of A. fib, hypertension, hyperlipidemia, anemia, COPD and chronic hyponatremia. The patient presents to the ED with the above chief complaints. She has had cough, dyspnea and generalized weakness for the past few days. She is on home oxygen 2 L. Chest x-ray show pneumonia.  PAST MEDICAL HISTORY:   Past Medical History:  Diagnosis Date  . A-fib (HCC)   . Acid reflux   . Anemia   . Anxiety   . Arthritis   . Asthma   . Chronic hyponatremia   . COPD (chronic obstructive pulmonary disease) (HCC)   . GERD (gastroesophageal reflux disease)   . Hiatal hernia   . Hiatal hernia   . Hyperlipidemia   . Hypertension   . Hypothyroidism   . Incontinence   . Migraine   . Osteoporosis, post-menopausal   . Persistent cough   . Pulmonary fibrosis (HCC)   . SIADH (syndrome of inappropriate ADH production) (HCC)   . TIA (transient ischemic attack)     PAST SURGICAL HISTORY:   Past Surgical History:  Procedure Laterality Date  . ABDOMINAL HYSTERECTOMY  1966    SOCIAL HISTORY:   Social History  Substance Use Topics  . Smoking status: Never Smoker  . Smokeless tobacco: Never Used  . Alcohol use No    FAMILY HISTORY:   Family History  Problem Relation Age of Onset  . Leukemia Mother   . Heart disease Father   . Hypertension Unknown     DRUG ALLERGIES:   Allergies  Allergen Reactions  . Aspirin Other (See Comments)    Upset stomach with high doses  . Codeine Other (See Comments)    Reaction:  Unknown   . Ditropan  [Oxybutynin] Other (See Comments)    Reaction:  Unknown   . Hctz [Hydrochlorothiazide] Other (See Comments)    Reaction:  Causes pts sodium/potassium levels to drop significantly   . Metronidazole Other (See Comments)    Reaction:  Unknown   . Propranolol Other (See Comments)    Reaction:  Unknown   . Tavist [Clemastine] Other (See Comments)    Reaction:  Unknown   . Tramadol Other (See Comments)    Reaction:  Unknown   . Vicodin [Hydrocodone-Acetaminophen] Other (See Comments)    Reaction:  Unknown     REVIEW OF SYSTEMS:   Review of Systems  Constitutional: Positive for chills, fever and malaise/fatigue.  HENT: Negative for sore throat.   Eyes: Negative for blurred vision and double vision.  Respiratory: Positive for cough, sputum production and shortness of breath. Negative for hemoptysis, wheezing and stridor.   Cardiovascular: Negative for chest pain, palpitations, orthopnea and leg swelling.  Gastrointestinal: Negative for abdominal pain, blood in stool, diarrhea, melena, nausea and vomiting.  Genitourinary: Negative for dysuria, flank pain and hematuria.  Musculoskeletal: Negative for back pain and joint pain.  Neurological: Positive for weakness. Negative for dizziness, sensory change, focal weakness, seizures, loss of consciousness and headaches.  Endo/Heme/Allergies: Negative for polydipsia.  Psychiatric/Behavioral: Negative for depression. The patient  is not nervous/anxious.     MEDICATIONS AT HOME:   Prior to Admission medications   Medication Sig Start Date End Date Taking? Authorizing Provider  aspirin EC 81 MG EC tablet Take 1 tablet (81 mg total) by mouth daily. 09/06/16  Yes Milagros Loll, MD  atorvastatin (LIPITOR) 40 MG tablet Take 1 tablet (40 mg total) by mouth daily at 6 PM. 08/22/16  Yes Enid Baas, MD  cholecalciferol (VITAMIN D) 1000 UNITS tablet Take 1,000 Units by mouth daily.   Yes [provider]  cloNIDine (CATAPRES) 0.1 MG tablet  Take 0.1 mg by mouth 3 (three) times daily.   Yes [provider]  diltiazem (CARDIZEM CD) 120 MG 24 hr capsule Take 1 capsule (120 mg total) by mouth daily. 12/15/15  Yes Hower, Cletis [place], MD  docusate sodium (COLACE) 100 MG capsule Take 100 mg by mouth 2 (two) times daily.    Yes [provider]  ferrous sulfate 325 (65 FE) MG tablet Take 325 mg by mouth daily.    Yes [provider]  fluticasone (FLOVENT HFA) 110 MCG/ACT inhaler Inhale 2 puffs into the lungs 2 (two) times daily. 11/13/16 11/13/17 Yes [provider]  furosemide (LASIX) 20 MG tablet Take 1 tablet (20 mg total) by mouth daily. 10/13/16  Yes Enid Baas, MD  levothyroxine (SYNTHROID, LEVOTHROID) 125 MCG tablet Take 125 mcg by mouth daily before breakfast.   Yes [provider]  LORazepam (ATIVAN) 0.5 MG tablet Take 1 tablet (0.5 mg total) by mouth 3 (three) times daily. 10/12/16  Yes Enid Baas, MD  losartan (COZAAR) 25 MG tablet Take 25 mg by mouth daily.    Yes [provider]  multivitamin-lutein (OCUVITE-LUTEIN) CAPS capsule Take 2 capsules by mouth daily.   Yes [provider]  Oxybutynin Chloride (GELNIQUE) 10 % GEL Place 1 application onto the skin daily. 01/27/17  Yes McGowan, Carollee Herter A, PA-C  polyethylene glycol (MIRALAX / GLYCOLAX) packet Take 8.5-17 g by mouth daily. *Mix in 4-8 ounces of fluid prior to taking*   Yes [provider]  ranitidine (ZANTAC) 150 MG tablet Take 150 mg by mouth 2 (two) times daily before a meal.    Yes [provider]  sodium bicarbonate 650 MG tablet Take 325 mg by mouth 4 (four) times daily.   Yes [provider]  spironolactone (ALDACTONE) 25 MG tablet Take 25 mg by mouth daily.   Yes [provider]  tiotropium (SPIRIVA) 18 MCG inhalation capsule Place 18 mcg into inhaler and inhale daily.   Yes [provider]  vitamin B-12 (CYANOCOBALAMIN) 1000 MCG tablet Take 1,000 mcg by mouth  daily.   Yes [provider]  acetaminophen (TYLENOL) 650 MG CR tablet Take 650 mg by mouth every 8 (eight) hours as needed for pain.    [provider]  albuterol (PROVENTIL HFA;VENTOLIN HFA) 108 (90 BASE) MCG/ACT inhaler Inhale 1-2 puffs into the lungs every 4 (four) hours as needed for wheezing or shortness of breath.    [provider]  benzonatate (TESSALON) 100 MG capsule Take 1 capsule (100 mg total) by mouth 3 (three) times daily. Patient not taking: Reported on 01/28/2017 10/12/16   Enid Baas, MD  budesonide (PULMICORT) 0.5 MG/2ML nebulizer solution Take 2 mLs (0.5 mg total) by nebulization 2 (two) times daily. Patient not taking: Reported on 01/28/2017 10/12/16   Enid Baas, MD  cefdinir (OMNICEF) 250 MG/5ML suspension Take 5 mLs (250 mg total) by mouth 2 (two) times  daily. X 7 days Patient not taking: Reported on 01/28/2017 10/12/16   Enid BaasKalisetti, Radhika, MD  feeding supplement, ENSURE ENLIVE, (ENSURE ENLIVE) LIQD Take 237 mLs by mouth daily.     [provider]  guaiFENesin-dextromethorphan (ROBITUSSIN DM) 100-10 MG/5ML syrup Take 10 mLs by mouth every 6 (six) hours as needed for cough. Patient not taking: Reported on 09/04/2016 05/04/16   Ramonita LabGouru, Aruna, MD  ipratropium-albuterol (DUONEB) 0.5-2.5 (3) MG/3ML SOLN Take 3 mLs by nebulization every 6 (six) hours as needed. Patient not taking: Reported on 01/28/2017 10/12/16   Enid BaasKalisetti, Radhika, MD  predniSONE (STERAPRED UNI-PAK 48 TAB) 10 MG (48) TBPK tablet 6 tabs PO x 2 days 5 tabs PO x 2 days 4 tabs PO x 2 days 3 tabs PO x 3 days 2 tabs PO x 3 days 1 tab PO x 3 days and stop Patient not taking: Reported on 01/28/2017 10/12/16   Enid BaasKalisetti, Radhika, MD  triamcinolone (KENALOG) 0.025 % ointment Apply 1 application topically 2 (two) times daily. 09/22/16   McGowan, Carollee HerterShannon A, PA-C      VITAL SIGNS:  Blood pressure (!) 146/81, pulse 61, temperature 98.6 F (37 C), temperature source Oral, resp. rate  18, height 5\' 2"  (1.575 m), weight 104 lb (47.2 kg), SpO2 96 %.  PHYSICAL EXAMINATION:  Physical Exam  GENERAL:  83 y.o.-year-old patient lying in the bed with no acute distress.  EYES: Pupils equal, round, reactive to light and accommodation. No scleral icterus. Extraocular muscles intact.  HEENT: Head atraumatic, normocephalic. Oropharynx and nasopharynx clear.  NECK:  Supple, no jugular venous distention. No thyroid enlargement, no tenderness.  LUNGS: Normal breath sounds bilaterally, no wheezing, rales,rhonchi or crepitation. No use of accessory muscles of respiration.  CARDIOVASCULAR: S1, S2 normal. No murmurs, rubs, or gallops.  ABDOMEN: Soft, nontender, nondistended. Bowel sounds present. No organomegaly or mass.  EXTREMITIES: No pedal edema, cyanosis, or clubbing.  NEUROLOGIC: Cranial nerves II through XII are intact. Muscle strength 4/5 in all extremities. Sensation intact. Gait not checked.  PSYCHIATRIC: The patient is alert and oriented x 3.  SKIN: No obvious rash, lesion, or ulcer.   LABORATORY PANEL:   CBC  Recent Labs Lab 01/28/17 0951  WBC 13.7*  HGB 11.5*  HCT 33.9*  PLT 302   ------------------------------------------------------------------------------------------------------------------  Chemistries   Recent Labs Lab 01/28/17 0951  NA 126*  K 5.6*  CL 83*  CO2 32  GLUCOSE 104*  BUN 14  CREATININE 0.54  CALCIUM 9.9  AST 29  ALT 14  ALKPHOS 98  BILITOT 0.8   ------------------------------------------------------------------------------------------------------------------  Cardiac Enzymes  Recent Labs Lab 01/28/17 1538  TROPONINI 0.04*   ------------------------------------------------------------------------------------------------------------------  RADIOLOGY:  Dg Chest 1 View  Result Date: 01/28/2017 CLINICAL DATA:  Weakness and lethargy for several days. EXAM: CHEST 1 VIEW COMPARISON:  10/10/2016 FINDINGS: Enlargement of the cardiac  silhouette is unchanged. Aortic atherosclerosis is noted. A left pleural effusion and underlying left basilar airspace opacity are stable to slightly increased. Milder, patchy opacity in the right lung base has not significantly changed. A small amount of fissural fluid is present on the right. There is biapical pleural thickening. No pneumothorax is identified. No acute osseous abnormality is seen. IMPRESSION: 1. Stable to slightly increased left pleural effusion and left basilar consolidation. 2. Unchanged mild right basilar infiltrate. Electronically Signed   By: Sebastian AcheAllen  Grady M.D.   On: 01/28/2017 10:10      IMPRESSION AND PLAN:   Sepsis due to pneumonia. The patient is admitted to  medical floor. Start sepsis protocol, continue Zithromax and Rocephin, follow-up CBC and cultures.  Hyperkalemia. Give Kayexalate and follow-up level. Hyponatremia. Hold Lasix, start normal saline IV and a follow-up BMP. Elevated troponin. Possible due to demanding ischemia. Follow-up troponin level and continue aspirin. Chronic respiratory failure and COPD. Stable. Nebulizer when necessary. Continue home oxygen. Hypertension. Continue clonidine and Cardizem but hold Lasix and losartan due to hyponatremia and hyperkalemia.  All the records are reviewed and case discussed with ED provider. Management plans discussed with the patient, her daughter and they are in agreement.  CODE STATUS: DO NOT RESUSCITATE.  TOTAL TIME TAKING CARE OF THIS PATIENT: 57 minutes.    Shaune Pollack M.D on 01/28/2017 at 7:31 PM  Between 7am to 6pm - Pager - [phone]  After 6pm go to [email or web address] - Social research officer, government  [facility]  Office  [phone]  CC: Primary care physician; Rafael Bihari, MD   Note: This dictation was prepared with Dragon dictation along with smaller phrase technology. Any transcriptional errors that result from this process are unintentional.

## 2017-01-28 NOTE — ED Notes (Signed)
Dr. Cecere notified of critical trop 

## 2017-01-28 NOTE — ED Notes (Signed)
Pt given coffee and crackers  

## 2017-01-28 NOTE — ED Provider Notes (Signed)
[facility] Emergency Department Provider Note       Time seen: ----------------------------------------- 9:50 AM on 01/28/2017 -----------------------------------------  Level V caveat: History/ROS limited by altered mental status   I have reviewed the triage vital signs and the nursing notes.   HISTORY   Chief Complaint Weakness    HPI Alyssa Landry is a 83 y.o. female who presents to the ED for weakness and lethargy for the past several days. Patient is chronically on home oxygen and arrives without any further information or report. She was wheezing prior to arrival received an albuterol treatment.   Past Medical History:  Diagnosis Date  . A-fib (HCC)   . Acid reflux   . Anemia   . Anxiety   . Arthritis   . Asthma   . Chronic hyponatremia   . COPD (chronic obstructive pulmonary disease) (HCC)   . GERD (gastroesophageal reflux disease)   . Hiatal hernia   . Hiatal hernia   . Hyperlipidemia   . Hypertension   . Hypothyroidism   . Incontinence   . Migraine   . Osteoporosis, post-menopausal   . Persistent cough   . Pulmonary fibrosis (HCC)   . SIADH (syndrome of inappropriate ADH production) (HCC)   . TIA (transient ischemic attack)     Patient Active Problem List   Diagnosis Date Noted  . HCAP (healthcare-associated pneumonia) 10/08/2016  . TIA (transient ischemic attack) 09/04/2016  . Stroke (HCC) 08/20/2016  . CHF (congestive heart failure) (HCC) 05/01/2016  . Chronic diastolic heart failure (HCC) 04/16/2016  . HTN (hypertension) 04/16/2016  . Hypoxia   . SOB (shortness of breath)   . Collapse of left lung   . Pleural effusion   . Centrilobular emphysema (HCC)   . Hyponatremia   . Pressure ulcer 02/19/2016  . Atrial fibrillation with RVR (HCC) 12/11/2015  . Urge incontinence 09/26/2015  . Lichenified rash 09/26/2015  . Syncopal episodes 09/25/2015    Past Surgical History:  Procedure Laterality Date  . ABDOMINAL  HYSTERECTOMY  1966    Allergies Aspirin; Codeine; Ditropan [oxybutynin]; Hctz [hydrochlorothiazide]; Metronidazole; Propranolol; Tavist [clemastine]; Tramadol; and Vicodin [hydrocodone-acetaminophen]  Social History Social History  Substance Use Topics  . Smoking status: Never Smoker  . Smokeless tobacco: Never Used  . Alcohol use No    Review of Systems Respiratory: Positive shortness of breath Review of systems otherwise unknown  All systems negative/normal/unremarkable except as stated in the HPI  ____________________________________________   PHYSICAL EXAM:  VITAL SIGNS: ED Triage Vitals  Enc Vitals Group     BP --      Pulse Rate 01/28/17 0949 95     Resp 01/28/17 0949 20     Temp --      Temp Source 01/28/17 0949 Oral     SpO2 01/28/17 0949 95 %     Weight 01/28/17 0944 104 lb (47.2 kg)     Height 01/28/17 0944 5\' 2"  (1.575 m)     Head Circumference --      Peak Flow --      Pain Score --      Pain Loc --      Pain Edu? --      Excl. in GC? --    Constitutional: Alert but disoriented, mild distress Eyes: Conjunctivae are normal. Normal extraocular movements. ENT   Head: Normocephalic and atraumatic.   Nose: No congestion/rhinnorhea.   Mouth/Throat: Mucous membranes are moist.   Neck: No stridor. Cardiovascular: Normal rate, regular rhythm.  No murmurs, rubs, or gallops. Respiratory: Tachypnea with bilateral mild wheezing Gastrointestinal: Soft and nontender. Normal bowel sounds Musculoskeletal: Nontender with normal range of motion in extremities. No lower extremity tenderness nor edema. Neurologic:  Normal speech and language. No gross focal neurologic deficits are appreciated.  Skin:  Skin is warm, dry and intact. No rash noted. Psychiatric: Mood and affect are normal. Speech and behavior are normal.  ____________________________________________  EKG: Interpreted by me. Atrial fibrillation with a rate of 82 bpm, normal QRS size, left axis  deviation, anterior lateral infarct age indeterminate  ____________________________________________  ED COURSE:  Pertinent labs & imaging results that were available during my care of the patient were reviewed by me and considered in my medical decision making (see chart for details). Patient presents for weakness, we will assess with labs and imaging as indicated.   Procedures ____________________________________________   LABS (pertinent positives/negatives)  Labs Reviewed  CBC WITH DIFFERENTIAL/PLATELET - Abnormal; Notable for the following:       Result Value   WBC 13.7 (*)    Hemoglobin 11.5 (*)    HCT 33.9 (*)    Neutro Abs 10.9 (*)    Monocytes Absolute 1.4 (*)    All other components within normal limits  COMPREHENSIVE METABOLIC PANEL - Abnormal; Notable for the following:    Sodium 126 (*)    Potassium 5.6 (*)    Chloride 83 (*)    Glucose, Bld 104 (*)    All other components within normal limits  TROPONIN I - Abnormal; Notable for the following:    Troponin I 0.04 (*)    All other components within normal limits  URINALYSIS, COMPLETE (UACMP) WITH MICROSCOPIC    RADIOLOGY Images were viewed by me  Chest x-ray  IMPRESSION: 1. Stable to slightly increased left pleural effusion and left basilar consolidation. 2. Unchanged mild right basilar infiltrate. ____________________________________________  FINAL ASSESSMENT AND PLAN  Weakness, elevated troponin, hyponatremia  Plan: Patient's labs and imaging were dictated above. Patient had presented for weakness and wheezing. She does have persistent pleural effusion and left basilar consolidation and may have persistent pneumonia. She does have low sodium and chloride with an elevated potassium. Troponin is chronically elevated. She'll be started on saline infusion as well as IV antibiotics. I will discuss with the hospitalist for admission.   Emily FilbertWilliams, Alano Blasco E, MD   Note: This note was generated in part or  whole with voice recognition software. Voice recognition is usually quite accurate but there are transcription errors that can and very often do occur. I apologize for any typographical errors that were not detected and corrected.     Emily FilbertWilliams, Saraphina Lauderbaugh E, MD 01/28/17 1105

## 2017-01-28 NOTE — ED Notes (Signed)
Pt resting in bed, family at bedside, assisted to use bathroom and then put back in bed, family aware of pending admission

## 2017-01-28 NOTE — ED Triage Notes (Signed)
Pt arrives via EMS from home with weakness and lethargy for several days, pt on 2L [place] chronic, arrives with skin tear to right forearm, states general pain but is unable to say specifically where, EMS reports wheezing upon their arrival and gave a albuterol tx, states cough, awake and alert, EDP at bedside

## 2017-01-28 NOTE — Progress Notes (Signed)
Pharmacy Antibiotic Note  Alyssa Landry is a 83 y.o. female admitted on 01/28/2017 with CAP.  Pharmacy has been consulted for ceftriaxone and azithromycin dosing.  Plan: Patient received 1 dose of Levofloxacin in ED today at 11:28. Will start new antibiotics tomorrow d/t DDI with levofloxacin and azithromycin.  1. Azithromycin 500 mg IV Q24H 2. Ceftriaxone 1 gm IV Q24H  Height: 5\' 2"  (157.5 cm) Weight: 104 lb (47.2 kg) IBW/kg (Calculated) : 50.1  Temp (24hrs), Avg:98.2 F (36.8 C), Min:97.8 F (36.6 C), Max:98.6 F (37 C)   Recent Labs Lab 01/28/17 0951  WBC 13.7*  CREATININE 0.54    Estimated Creatinine Clearance: 30.6 mL/min (by C-G formula based on SCr of 0.54 mg/dL).    Allergies  Allergen Reactions  . Aspirin Other (See Comments)    Upset stomach with high doses  . Codeine Other (See Comments)    Reaction:  Unknown   . Ditropan [Oxybutynin] Other (See Comments)    Reaction:  Unknown   . Hctz [Hydrochlorothiazide] Other (See Comments)    Reaction:  Causes pts sodium/potassium levels to drop significantly   . Metronidazole Other (See Comments)    Reaction:  Unknown   . Propranolol Other (See Comments)    Reaction:  Unknown   . Tavist [Clemastine] Other (See Comments)    Reaction:  Unknown   . Tramadol Other (See Comments)    Reaction:  Unknown   . Vicodin [Hydrocodone-Acetaminophen] Other (See Comments)    Reaction:  Unknown    Thank you for allowing pharmacy to be a part of this patient's care.  Carola FrostNathan A Kaleea Penner, Pharm.D., BCPS Clinical Pharmacist 01/28/2017 3:34 PM

## 2017-01-29 LAB — BASIC METABOLIC PANEL
ANION GAP: 9 (ref 5–15)
BUN: 16 mg/dL (ref 6–20)
CHLORIDE: 88 mmol/L — AB (ref 101–111)
CO2: 29 mmol/L (ref 22–32)
Calcium: 9.1 mg/dL (ref 8.9–10.3)
Creatinine, Ser: 0.51 mg/dL (ref 0.44–1.00)
GFR calc non Af Amer: >60 mL/min (ref >60)
Glucose, Bld: 159 mg/dL — ABNORMAL HIGH (ref 65–99)
POTASSIUM: 4.7 mmol/L (ref 3.5–5.1)
Sodium: 126 mmol/L — ABNORMAL LOW (ref 135–145)

## 2017-01-29 LAB — CBC
HEMATOCRIT: 29.8 % — AB (ref 35.0–47.0)
Hemoglobin: 10.1 g/dL — ABNORMAL LOW (ref 12.0–16.0)
MCH: 28.8 pg (ref 26.0–34.0)
MCHC: 34 g/dL (ref 32.0–36.0)
MCV: 84.7 fL (ref 80.0–100.0)
Platelets: 299 10*3/uL (ref 150–440)
RBC: 3.52 MIL/uL — AB (ref 3.80–5.20)
RDW: 13.7 % (ref 11.5–14.5)
WBC: 9.6 10*3/uL (ref 3.6–11.0)

## 2017-01-29 MED ORDER — ACETAMINOPHEN 325 MG PO TABS
650.0000 mg | ORAL_TABLET | Freq: Four times a day (QID) | ORAL | Status: DC | PRN
  Administered 2017-01-29: 13:00:00 650 mg via ORAL
  Filled 2017-01-29: qty 2

## 2017-01-29 MED ORDER — LEVOTHYROXINE SODIUM 125 MCG PO TABS
125.0000 ug | ORAL_TABLET | Freq: Every day | ORAL | Status: DC
  Administered 2017-01-29 – 2017-01-31 (×3): 125 ug via ORAL
  Filled 2017-01-29 (×3): qty 1

## 2017-01-29 MED ORDER — LEVOFLOXACIN 250 MG PO TABS
250.0000 mg | ORAL_TABLET | Freq: Every day | ORAL | Status: DC
  Administered 2017-01-30 – 2017-01-31 (×2): 250 mg via ORAL
  Filled 2017-01-29 (×2): qty 1

## 2017-01-29 NOTE — Progress Notes (Signed)
Spoke with daughters concern pnt has not voided since yesterday. Bladder scan showed >37780ml.  Per Dr. Mosetta PuttPyreddy's order in and out cath if scan >47100ml. Pnt appears comfortable in no distress. Will continue to monitor. Incont brief in place and dry.

## 2017-01-29 NOTE — Progress Notes (Signed)
Pnt's daughter called out stated pnt feels SOB worsening. Notified RT for treatments. Assessed pnt noted audible wheezing however lung fields clear but diminished. Also, changed dressing to right elbow skin tear. Pnt remains on 3L o2 [place] sats upper 90's. Pnt did not appear to be in any resp distress. Will continue to support and assess. At the time of this not RT not at bedside.

## 2017-01-29 NOTE — Progress Notes (Signed)
Patient's breathing at rest has improved since admission yesterday but continues to get very short of breath with movement in bed. [facility] pt doing very little movement herself (baseline walking independently with walker). Very poor appetite.   Pt's daughter who has been pt primary caregiver concerned about potential discharge over the weekend.

## 2017-01-29 NOTE — Progress Notes (Addendum)
[facility] - [place] at [facility]   PATIENT NAME: Alyssa Landry    MR#:  [id number]  DATE OF BIRTH:  1918/08/27  SUBJECTIVE:   Came in with weakness and sob. Family in the room. Feels a lot better REVIEW OF SYSTEMS:   Review of Systems  Constitutional: Negative for chills, fever and weight loss.  HENT: Negative for ear discharge, ear pain and nosebleeds.   Eyes: Negative for blurred vision, pain and discharge.  Respiratory: Negative for sputum production, shortness of breath, wheezing and stridor.   Cardiovascular: Negative for chest pain, palpitations, orthopnea and PND.  Gastrointestinal: Negative for abdominal pain, diarrhea, nausea and vomiting.  Genitourinary: Negative for frequency and urgency.  Musculoskeletal: Negative for back pain and joint pain.  Neurological: Positive for weakness. Negative for sensory change, speech change and focal weakness.  Psychiatric/Behavioral: Negative for depression and hallucinations. The patient is not nervous/anxious.    Tolerating Diet:yes Tolerating PT: pending  DRUG ALLERGIES:   Allergies  Allergen Reactions  . Aspirin Other (See Comments)    Upset stomach with high doses  . Codeine Other (See Comments)    Reaction:  Unknown   . Ditropan [Oxybutynin] Other (See Comments)    Reaction:  Unknown   . Hctz [Hydrochlorothiazide] Other (See Comments)    Reaction:  Causes pts sodium/potassium levels to drop significantly   . Metronidazole Other (See Comments)    Reaction:  Unknown   . Propranolol Other (See Comments)    Reaction:  Unknown   . Tavist [Clemastine] Other (See Comments)    Reaction:  Unknown   . Tramadol Other (See Comments)    Reaction:  Unknown   . Vicodin [Hydrocodone-Acetaminophen] Other (See Comments)    Reaction:  Unknown     VITALS:  Blood pressure (!) 103/52, pulse 76, temperature 97.8 F (36.6 C), temperature source Oral, resp. rate 20, height 5\' 2"  (1.575 m), weight 47.2 kg (104  lb), SpO2 100 %.  PHYSICAL EXAMINATION:   Physical Exam  GENERAL:  83 y.o.-year-old patient lying in the bed with no acute distress. Chronically ill EYES: Pupils equal, round, reactive to light and accommodation. No scleral icterus. Extraocular muscles intact.  HEENT: Head atraumatic, normocephalic. Oropharynx and nasopharynx clear.  NECK:  Supple, no jugular venous distention. No thyroid enlargement, no tenderness.  LUNGS: Normal breath sounds bilaterally, no wheezing, rales, rhonchi. No use of accessory muscles of respiration.  CARDIOVASCULAR: S1, S2 normal. No murmurs, rubs, or gallops.  ABDOMEN: Soft, nontender, nondistended. Bowel sounds present. No organomegaly or mass.  EXTREMITIES: No cyanosis, clubbing or edema b/l.    NEUROLOGIC:moves all extremities well PSYCHIATRIC: alert and awake SKIN: No obvious rash, lesion, or ulcer.   LABORATORY PANEL:  CBC  Recent Labs Lab 01/29/17 0507  WBC 9.6  HGB 10.1*  HCT 29.8*  PLT 299    Chemistries   Recent Labs Lab 01/28/17 0951  01/29/17 0507  NA 126*  < > 126*  K 5.6*  < > 4.7  CL 83*  < > 88*  CO2 32  < > 29  GLUCOSE 104*  < > 159*  BUN 14  < > 16  CREATININE 0.54  < > 0.51  CALCIUM 9.9  < > 9.1  AST 29  --   --   ALT 14  --   --   ALKPHOS 98  --   --   BILITOT 0.8  --   --   < > = values in this  interval not displayed. Cardiac Enzymes  Recent Labs Lab 01/28/17 1538  TROPONINI 0.04*   RADIOLOGY:  Dg Chest 1 View  Result Date: 01/28/2017 CLINICAL DATA:  Weakness and lethargy for several days. EXAM: CHEST 1 VIEW COMPARISON:  10/10/2016 FINDINGS: Enlargement of the cardiac silhouette is unchanged. Aortic atherosclerosis is noted. A left pleural effusion and underlying left basilar airspace opacity are stable to slightly increased. Milder, patchy opacity in the right lung base has not significantly changed. A small amount of fissural fluid is present on the right. There is biapical pleural thickening. No  pneumothorax is identified. No acute osseous abnormality is seen. IMPRESSION: 1. Stable to slightly increased left pleural effusion and left basilar consolidation. 2. Unchanged mild right basilar infiltrate. Electronically Signed   By: Sebastian AcheAllen  Grady M.D.   On: 01/28/2017 10:10   ASSESSMENT AND PLAN:  Heidi Dachnnie Rahimi  is a 83 y.o. female with a known history of A. fib, hypertension, hyperlipidemia, anemia, COPD and chronic hyponatremia. The patient presents to the ED with the above chief complaints. She has had cough, dyspnea and generalized weakness for the past few days. She is on home oxygen 2 L. Chest x-ray show pneumonia.  1.Sepsis due to pneumonia mild left LL -procalcitonin negative -came in with AMS, elevated Lactic acid and WBC -change to po levaquin  2.Hyperkalemia. Give Kayexalate and follow-up level. -5.7--4.7  3. Chronic Hyponatremia.  -table counts  4. Elevated troponin. Possible due to demanding ischemia. Follow-up troponin level and continue aspirin. -troponin 0.04--0.04  5.Chronic respiratory failure and COPD. Stable. Nebulizer when necessary. Continue home oxygen.  6.Hypertension. Continue clonidine and Cardizem but hold Lasix and losartan due to hyponatremia  DNR-confirmed with dter in the room  Case discussed with Care Management/Social Worker. Management plans discussed with the patient, family and they are in agreement.  CODE STATUS: DNR  DVT Prophylaxis: Lovenox  TOTAL TIME TAKING CARE OF THIS PATIENT: 30 minutes.  >50% time spent on counselling and coordination of care  POSSIBLE D/C IN 1-2 DAYS, DEPENDING ON CLINICAL CONDITION.  Note: This dictation was prepared with Dragon dictation along with smaller phrase technology. Any transcriptional errors that result from this process are unintentional.  Novak Stgermaine M.D on 01/29/2017 at 4:18 PM  Between 7am to 6pm - Pager - [phone]  After 6pm go to [email or web address] - password [email or web address]  [facility]  Office  [phone]  CC: Primary care physician; Rafael BihariWalker, John B III, MD

## 2017-01-30 LAB — SODIUM: Sodium: 126 mmol/L — ABNORMAL LOW (ref 135–145)

## 2017-01-30 MED ORDER — CLONIDINE HCL 0.1 MG PO TABS: 0.1000 mg | ORAL_TABLET | Freq: Two times a day (BID) | ORAL | 11 refills | Status: AC

## 2017-01-30 MED ORDER — FUROSEMIDE 20 MG PO TABS: 20.0000 mg | ORAL_TABLET | Freq: Every day | ORAL | 2 refills | Status: DC | PRN

## 2017-01-30 MED ORDER — LEVOFLOXACIN 250 MG PO TABS: 250.0000 mg | ORAL_TABLET | Freq: Every day | ORAL | 0 refills | Status: AC

## 2017-01-30 NOTE — Evaluation (Signed)
Physical Therapy Evaluation Patient Details Name: Alyssa Landry MRN: [id number] DOB: Nov 15, 1918 Today's Date: 01/30/2017   History of Present Illness  Pt is a 83 y/o F who presented with generalized weakness.  Chest x-ray showed pneumonia.  Pt admitted with sepsis due to pneumonia.  Pt's PMH includes a-fib, COPD, osteoporosis, pulmonary fibrosis, TIA.    Clinical Impression  Pt admitted with above diagnosis. Pt currently with functional limitations due to the deficits listed below (see PT Problem List). Alyssa Landry presents close to her baseline level of mobility.  She demonstrated ability to ambulate 80 ft with RW and min guard assist for safety.  She is steady ambulating with her RW and she is able to perform sit<>stand with min guard for safety as well.  SpO2 down to 86% on 2L O2 which quickly rises to low 90s with brief standing rest break x1 but returns to 86% on 2L at end of walk. SpO2 at or above 94% on 2L O2 when at rest, including at end of session with pt resting comfortably in chair.  Pt will benefit from skilled PT to increase their independence and safety with mobility to allow discharge to the venue listed below.      Follow Up Recommendations Home health PT;Supervision/Assistance - 24 hour    Equipment Recommendations  None recommended by PT    Recommendations for Other Services       Precautions / Restrictions Precautions Precautions: Fall;Other (comment) Precaution Comments: Monitor O2 Restrictions Weight Bearing Restrictions: No      Mobility  Bed Mobility Overal bed mobility: Needs Assistance Bed Mobility: Supine to Sit     Supine to sit: Supervision;HOB elevated     General bed mobility comments: Pt requires increased time but does not require any physical assist or cues.  Transfers Overall transfer level: Needs assistance Equipment used: Rolling walker (2 wheeled) Transfers: Sit to/from Stand Sit to Stand: Min guard         General transfer  comment: Pt is able to stand from low bed height with safe technique with proper hand placement.  Close min guard as mild instability noted.  Pt denies dizziness.    Ambulation/Gait Ambulation/Gait assistance: Min guard Ambulation Distance (Feet): 80 Feet Assistive device: Rolling walker (2 wheeled) Gait Pattern/deviations: Step-through pattern;Decreased stride length;Trunk flexed   Gait velocity interpretation: at or above normal speed for age/gender General Gait Details: Flexed posture and pt pushing RW too far ahead of her which only improves very briefly following verbal cues.  SpO2 down to 86% on 2L O2 which quickly rises to low 90s with brief standing rest break x1 but returns to 86% on 2L at end of walk.  Pt steady ambulating with RW, min guard provided for safety.  Stairs            Wheelchair Mobility    Modified Rankin (Stroke Patients Only)       Balance Overall balance assessment: Needs assistance Sitting-balance support: No upper extremity supported;Feet supported Sitting balance-Leahy Scale: Good     Standing balance support: No upper extremity supported;During functional activity Standing balance-Leahy Scale: Fair Standing balance comment: Pt able to stand statically without UE support but requires UE support for dynamic activities.                             Pertinent Vitals/Pain Pain Assessment: No/denies pain    Home Living Family/patient expects to be discharged to:: Private residence Living  Arrangements: Children Available Help at Discharge: Family;Available 24 hours/day (lives with her daughter) Type of Home: House Home Access: Stairs to enter Entrance Stairs-Rails: None Entrance Stairs-Number of Steps: 1 Home Layout: One level Home Equipment: Walker - 2 wheels;Grab bars - toilet      Prior Function Level of Independence: Needs assistance   Gait / Transfers Assistance Needed: Pt ambulates household distances with RW.  Denies  having any falls in the past 6 months.    ADL's / Homemaking Assistance Needed: Daughter assists pt with sponge bath.  Pt dresses independently.  Daughter does the cooking, cleaning, driving.          Hand Dominance        Extremity/Trunk Assessment   Upper Extremity Assessment Upper Extremity Assessment:  (BUE strength grossly 4/5)    Lower Extremity Assessment Lower Extremity Assessment:  (BLE strength grossly 4-/5)    Cervical / Trunk Assessment Cervical / Trunk Assessment: Kyphotic  Communication   Communication: HOH  Cognition Arousal/Alertness: Awake/alert Behavior During Therapy: Flat affect                                   General Comments: Pt oriented to self, place      General Comments      Exercises Other Exercises Other Exercises: Encouraged pt to ambualte with nursing staff at least 3x/day Other Exercises: Encouraged daughter to place chairs in each room in the home to allow pt a place to take a seated rest break when needed.   Assessment/Plan    PT Assessment Patient needs continued PT services  PT Problem List Decreased strength;Decreased activity tolerance;Decreased balance;Decreased knowledge of use of DME;Decreased safety awareness;Cardiopulmonary status limiting activity       PT Treatment Interventions DME instruction;Gait training;Stair training;Functional mobility training;Therapeutic activities;Therapeutic exercise;Balance training;Neuromuscular re-education;Patient/family education;Other (comment) (Energy conservation techniques)    PT Goals (Current goals can be found in the Care Plan section)  Acute Rehab PT Goals Patient Stated Goal: to get stronger PT Goal Formulation: With patient Time For Goal Achievement: 02/13/17 Potential to Achieve Goals: Good    Frequency Min 2X/week   Barriers to discharge        Co-evaluation               AM-PAC PT "6 Clicks" Daily Activity  Outcome Measure Difficulty  turning over in bed (including adjusting bedclothes, sheets and blankets)?: None Difficulty moving from lying on back to sitting on the side of the bed? : A Little Difficulty sitting down on and standing up from a chair with arms (e.g., wheelchair, bedside commode, etc,.)?: A Little Help needed moving to and from a bed to chair (including a wheelchair)?: A Little Help needed walking in hospital room?: A Little Help needed climbing 3-5 steps with a railing? : A Little 6 Click Score: 19    End of Session Equipment Utilized During Treatment: Gait belt;Oxygen Activity Tolerance: Patient tolerated treatment well Patient left: in chair;with call bell/phone within reach;with chair alarm set;with family/visitor present Nurse Communication: Mobility status;Other (comment) (SpO2) PT Visit Diagnosis: Muscle weakness (generalized) (M62.81);Unsteadiness on feet (R26.81)    Time: [id number] PT Time Calculation (min) (ACUTE ONLY): 30 min   Charges:   PT Evaluation $PT Eval Low Complexity: 1 Procedure PT Treatments $Gait Training: 8-22 mins   PT G Codes:        Encarnacion ChuAshley Abashian PT, DPT 01/30/2017, 10:48 AM

## 2017-01-30 NOTE — Plan of Care (Signed)
Pt has allergy, high fall, allergy and DNR bracelets on.  Pt's daughter was adamant that she felt her mother needed to stay one more day in hospital.  Dr. Allena KatzPatel agreed but she would have to d/c tomorrow.  Started her on PO abx today.  Pt remained safe.  Wked w/PT and they felt like she is steady with walker.  She desatted when walking but recovered when they did breathing exercises.

## 2017-01-30 NOTE — Plan of Care (Signed)
Problem: Safety: Goal: Ability to remain free from injury will improve Outcome: Progressing Patient is aware of her physical limitations. Safety measures in place. Patient knows when and how to call for nurse assistance when doing ADLs and toileting.

## 2017-01-30 NOTE — Discharge Summary (Signed)
[facility] - [place] at [facility]   PATIENT NAME: Alyssa Landry    MR#:  [id number]  DATE OF BIRTH:  1918-08-15  DATE OF ADMISSION:  01/28/2017 ADMITTING PHYSICIAN: Alyssa Pollack, MD  DATE OF DISCHARGE: 01/30/17  PRIMARY CARE PHYSICIAN: Alyssa Bihari, MD    ADMISSION DIAGNOSIS:  Hyponatremia [E87.1] Weakness [R53.1] Community acquired pneumonia of left lower lobe of lung (HCC) [J18.1]  DISCHARGE DIAGNOSIS:  Left LL pneumonia Chronic hyponatremia Chronic respiratory failure/COPD on 2 L nasal cannula oxygen at home. SECONDARY DIAGNOSIS:   Past Medical History:  Diagnosis Date  . A-fib (HCC)   . Acid reflux   . Anemia   . Anxiety   . Arthritis   . Asthma   . Chronic hyponatremia   . COPD (chronic obstructive pulmonary disease) (HCC)   . GERD (gastroesophageal reflux disease)   . Hiatal hernia   . Hiatal hernia   . Hyperlipidemia   . Hypertension   . Hypothyroidism   . Incontinence   . Migraine   . Osteoporosis, post-menopausal   . Persistent cough   . Pulmonary fibrosis (HCC)   . SIADH (syndrome of inappropriate ADH production) (HCC)   . TIA (transient ischemic attack)     HOSPITAL COURSE:  [name] a 83 y.o.femalewith a known history of A. fib, hypertension, hyperlipidemia, anemia, COPD and chronic hyponatremia. The patient presents to the ED with the above chief complaints. She has had cough, dyspnea and generalized weakness for the past few days. She is on home oxygen 2 L. Chest x-ray show pneumonia.  1.Sepsis due to pneumonia  left LL -procalcitonin negative -came in with AMS, elevated Lactic acid and WBC -change to po levaquin  2.Hyperkalemia. Give Kayexalate and follow-up level. -5.7--4.7  3. Chronic Hyponatremia.  -stable counts -Continue salt tablets  4. Elevated troponin. Possible due to demanding ischemia.  -continue aspirin. -troponin 0.04--0.04 -No chest pain  5.Chronic respiratory failure and  COPD. Stable. Nebulizer when necessary.  -Continue home oxygen and oral inhalers  6.Hypertension. Continue clonidine and Cardizem  -Discontinued losartan -Change dose of clonidine to twice a day -Change Lasix to as needed  DNR-confirmed with dter in the room  Physical therapy to see patient. Overall hemodynamically stable. We'll discharge later to home once seen by physical therapy CONSULTS OBTAINED:    DRUG ALLERGIES:   Allergies  Allergen Reactions  . Aspirin Other (See Comments)    Upset stomach with high doses  . Codeine Other (See Comments)    Reaction:  Unknown   . Ditropan [Oxybutynin] Other (See Comments)    Reaction:  Unknown   . Hctz [Hydrochlorothiazide] Other (See Comments)    Reaction:  Causes pts sodium/potassium levels to drop significantly   . Metronidazole Other (See Comments)    Reaction:  Unknown   . Propranolol Other (See Comments)    Reaction:  Unknown   . Tavist [Clemastine] Other (See Comments)    Reaction:  Unknown   . Tramadol Other (See Comments)    Reaction:  Unknown   . Vicodin [Hydrocodone-Acetaminophen] Other (See Comments)    Reaction:  Unknown     DISCHARGE MEDICATIONS:   Current Discharge Medication List    START taking these medications   Details  levofloxacin (LEVAQUIN) 250 MG tablet Take 1 tablet (250 mg total) by mouth daily. Qty: 5 tablet, Refills: 0      CONTINUE these medications which have CHANGED   Details  cloNIDine (CATAPRES) 0.1 MG tablet Take 1 tablet (  0.1 mg total) by mouth 2 (two) times daily. Qty: 60 tablet, Refills: 11    furosemide (LASIX) 20 MG tablet Take 1 tablet (20 mg total) by mouth daily as needed. Qty: 30 tablet, Refills: 2      CONTINUE these medications which have NOT CHANGED   Details  aspirin EC 81 MG EC tablet Take 1 tablet (81 mg total) by mouth daily.    atorvastatin (LIPITOR) 40 MG tablet Take 1 tablet (40 mg total) by mouth daily at 6 PM. Qty: 30 tablet, Refills: 2     cholecalciferol (VITAMIN D) 1000 UNITS tablet Take 1,000 Units by mouth daily.    diltiazem (CARDIZEM CD) 120 MG 24 hr capsule Take 1 capsule (120 mg total) by mouth daily. Qty: 30 capsule, Refills: 0    docusate sodium (COLACE) 100 MG capsule Take 100 mg by mouth 2 (two) times daily.     ferrous sulfate 325 (65 FE) MG tablet Take 325 mg by mouth daily.     fluticasone (FLOVENT HFA) 110 MCG/ACT inhaler Inhale 2 puffs into the lungs 2 (two) times daily.    levothyroxine (SYNTHROID, LEVOTHROID) 125 MCG tablet Take 125 mcg by mouth daily before breakfast.    LORazepam (ATIVAN) 0.5 MG tablet Take 1 tablet (0.5 mg total) by mouth 3 (three) times daily. Qty: 30 tablet, Refills: 0    multivitamin-lutein (OCUVITE-LUTEIN) CAPS capsule Take 2 capsules by mouth daily.    Oxybutynin Chloride (GELNIQUE) 10 % GEL Place 1 application onto the skin daily. Qty: 30 g, Refills: 6   Associated Diagnoses: OAB (overactive bladder)    polyethylene glycol (MIRALAX / GLYCOLAX) packet Take 8.5-17 g by mouth daily. *Mix in 4-8 ounces of fluid prior to taking*    ranitidine (ZANTAC) 150 MG tablet Take 150 mg by mouth 2 (two) times daily before a meal.     sodium bicarbonate 650 MG tablet Take 325 mg by mouth 4 (four) times daily.    spironolactone (ALDACTONE) 25 MG tablet Take 25 mg by mouth daily.    tiotropium (SPIRIVA) 18 MCG inhalation capsule Place 18 mcg into inhaler and inhale daily.    vitamin B-12 (CYANOCOBALAMIN) 1000 MCG tablet Take 1,000 mcg by mouth daily.    acetaminophen (TYLENOL) 650 MG CR tablet Take 650 mg by mouth every 8 (eight) hours as needed for pain.    albuterol (PROVENTIL HFA;VENTOLIN HFA) 108 (90 BASE) MCG/ACT inhaler Inhale 1-2 puffs into the lungs every 4 (four) hours as needed for wheezing or shortness of breath.    budesonide (PULMICORT) 0.5 MG/2ML nebulizer solution Take 2 mLs (0.5 mg total) by nebulization 2 (two) times daily. Qty: 20 mL, Refills: 0    feeding  supplement, ENSURE ENLIVE, (ENSURE ENLIVE) LIQD Take 237 mLs by mouth daily.     triamcinolone (KENALOG) 0.025 % ointment Apply 1 application topically 2 (two) times daily. Qty: 30 g, Refills: 0      STOP taking these medications     losartan (COZAAR) 25 MG tablet      benzonatate (TESSALON) 100 MG capsule      cefdinir (OMNICEF) 250 MG/5ML suspension      guaiFENesin-dextromethorphan (ROBITUSSIN DM) 100-10 MG/5ML syrup      ipratropium-albuterol (DUONEB) 0.5-2.5 (3) MG/3ML SOLN      predniSONE (STERAPRED UNI-PAK 48 TAB) 10 MG (48) TBPK tablet         If you experience worsening of your admission symptoms, develop shortness of breath, life threatening emergency, suicidal or homicidal thoughts  you must seek medical attention immediately by calling 911 or calling your MD immediately  if symptoms less severe.  You Must read complete instructions/literature along with all the possible adverse reactions/side effects for all the Medicines you take and that have been prescribed to you. Take any new Medicines after you have completely understood and accept all the possible adverse reactions/side effects.   Please note  You were cared for by a hospitalist during your hospital stay. If you have any questions about your discharge medications or the care you received while you were in the hospital after you are discharged, you can call the unit and asked to speak with the hospitalist on call if the hospitalist that took care of you is not available. Once you are discharged, your primary care physician will handle any further medical issues. Please note that NO REFILLS for any discharge medications will be authorized once you are discharged, as it is imperative that you return to your primary care physician (or establish a relationship with a primary care physician if you do not have one) for your aftercare needs so that they can reassess your need for medications and monitor your lab values. Today    SUBJECTIVE   Patient doing very well. no new complaints.  VITAL SIGNS:  Blood pressure 102/75, pulse (!) 125, temperature 98.6 F (37 C), temperature source Oral, resp. rate 18, height 5\' 2"  (1.575 m), weight 47.2 kg (104 lb), SpO2 99 %.  I/O:   Intake/Output Summary (Last 24 hours) at 01/30/17 0736 Last data filed at 01/29/17 1930  Gross per 24 hour  Intake              120 ml  Output              350 ml  Net             -230 ml    PHYSICAL EXAMINATION:  GENERAL:  83 y.o.-year-old patient lying in the bed with no acute distress.  EYES: Pupils equal, round, reactive to light and accommodation. No scleral icterus. Extraocular muscles intact.  HEENT: Head atraumatic, normocephalic. Oropharynx and nasopharynx clear.  NECK:  Supple, no jugular venous distention. No thyroid enlargement, no tenderness.  LUNGS: Distant breath sounds bilaterally, no wheezing, rales,rhonchi or crepitation. No use of accessory muscles of respiration.  CARDIOVASCULAR: S1, S2 normal. No murmurs, rubs, or gallops.  ABDOMEN: Soft, non-tender, non-distended. Bowel sounds present. No organomegaly or mass.  EXTREMITIES: No pedal edema, cyanosis, or clubbing.  NEUROLOGIC: Cranial nerves II through XII are intact. Muscle strength 5/5 in all extremities. Sensation intact. Gait not checked.  PSYCHIATRIC: The patient is alert and oriented x 3.  SKIN: No obvious rash, lesion, or ulcer.   DATA REVIEW:   CBC   Recent Labs Lab 01/29/17 0507  WBC 9.6  HGB 10.1*  HCT 29.8*  PLT 299    Chemistries   Recent Labs Lab 01/28/17 0951  01/29/17 0507 01/30/17 0500  NA 126*  < > 126* 126*  K 5.6*  < > 4.7  --   CL 83*  < > 88*  --   CO2 32  < > 29  --   GLUCOSE 104*  < > 159*  --   BUN 14  < > 16  --   CREATININE 0.54  < > 0.51  --   CALCIUM 9.9  < > 9.1  --   AST 29  --   --   --  ALT 14  --   --   --   ALKPHOS 98  --   --   --   BILITOT 0.8  --   --   --   < > = values in this interval not  displayed.  Microbiology Results   Recent Results (from the past 240 hour(s))  MRSA PCR Screening     Status: Abnormal   Collection Time: 01/28/17  5:54 PM  Result Value Ref Range Status   MRSA by PCR POSITIVE (A) NEGATIVE Final    Comment:        The GeneXpert MRSA Assay (FDA approved for NASAL specimens only), is one component of a comprehensive MRSA colonization surveillance program. It is not intended to diagnose MRSA infection nor to guide or monitor treatment for MRSA infections. RESULT CALLED TO, READ BACK BY AND VERIFIED WITH: DRUCILLA JACKSON 01/28/17 1953 KLW     RADIOLOGY:  Dg Chest 1 View  Result Date: 01/28/2017 CLINICAL DATA:  Weakness and lethargy for several days. EXAM: CHEST 1 VIEW COMPARISON:  10/10/2016 FINDINGS: Enlargement of the cardiac silhouette is unchanged. Aortic atherosclerosis is noted. A left pleural effusion and underlying left basilar airspace opacity are stable to slightly increased. Milder, patchy opacity in the right lung base has not significantly changed. A small amount of fissural fluid is present on the right. There is biapical pleural thickening. No pneumothorax is identified. No acute osseous abnormality is seen. IMPRESSION: 1. Stable to slightly increased left pleural effusion and left basilar consolidation. 2. Unchanged mild right basilar infiltrate. Electronically Signed   By: Sebastian Ache M.D.   On: 01/28/2017 10:10     Management plans discussed with the patient, family and they are in agreement.  CODE STATUS:     Code Status Orders        Start     Ordered   01/28/17 1524  Do not attempt resuscitation (DNR)  Continuous    Question Answer Comment  In the event of cardiac or respiratory ARREST Do not call a "code blue"   In the event of cardiac or respiratory ARREST Do not perform Intubation, CPR, defibrillation or ACLS   In the event of cardiac or respiratory ARREST Use medication by any route, position, wound care, and other  measures to relive pain and suffering. May use oxygen, suction and manual treatment of airway obstruction as needed for comfort.      01/28/17 1523    Code Status History    Date Active Date Inactive Code Status Order ID Comments User Context   10/09/2016  1:32 AM 10/12/2016  8:46 PM DNR [id number]  Hugelmeyer, Jon Gills, DO Inpatient   10/09/2016  1:21 AM 10/09/2016  1:32 AM Full Code [id number]  Hugelmeyer, Jon Gills, DO ED   09/04/2016  6:54 PM 09/05/2016  6:47 PM DNR [id number]  Adrian Saran, MD Inpatient   08/20/2016  5:11 PM 08/22/2016  6:30 PM DNR [id number]  Altamese Dilling, MD Inpatient   05/01/2016  6:48 PM 05/04/2016  7:55 PM DNR [id number]  Enedina Finner, MD Inpatient   02/18/2016 11:10 PM 02/25/2016  6:31 PM DNR [id number]  Tonye Royalty, DO Inpatient   01/26/2016  9:56 PM 01/30/2016  7:34 PM DNR [id number]  Houston Siren, MD Inpatient   12/11/2015  9:29 PM 12/15/2015  8:13 PM DNR [id number]  Ramonita Lab, MD Inpatient   09/25/2015  4:17 PM 09/26/2015 10:07 PM DNR [id number]  Altamese Dilling, MD ED    Advance Directive Documentation  Most Recent Value  Type of Advance Directive  Healthcare Power of Attorney, Out of facility DNR (pink MOST or yellow form)  Pre-existing out of facility DNR order (yellow form or pink MOST form)  Pink MOST form placed in chart (order not valid for inpatient use)  "MOST" Form in Place?  -      TOTAL TIME TAKING CARE OF THIS PATIENT: *40* minutes.    Klee Kolek M.D on 01/30/2017 at 7:36 AM  Between 7am to 6pm - Pager - [phone] After 6pm go to [email or web address] - password [email or web address]  [facility]  Office  [phone]  CC: Primary care physician; Alyssa Bihari, MD

## 2017-01-31 MED ORDER — FUROSEMIDE 20 MG PO TABS: 20.0000 mg | ORAL_TABLET | ORAL | 2 refills | Status: AC

## 2017-01-31 NOTE — Care Management Note (Signed)
Case Management Note  Patient Details  Name: Alyssa Landry MRN: [id number] Date of Birth: 1919/04/29  Subjective/Objective:     Left message on phone of daughter Alyssa Landry requesting a call back to discuss choice of home health providers. Per call back from daughter Alyssa Landry is already open to [facility]. Alyssa Landry at [facility] was updated about request for HH-PT, RN and discharge home today.                Action/Plan:   Expected Discharge Date:  01/31/17               Expected Discharge Plan:   01/31/17  In-House Referral:     Discharge planning Services     Post Acute Care Choice:   Yes Choice offered to:   Daughter  DME Arranged:    DME Agency:     HH Arranged:   RN, PT HH Agency:   [facility]  Status of Service:   Completed  If discussed at Long Length of Stay Meetings, dates discussed:    Additional Comments:  [name] A, RN 01/31/2017, 9:26 AM

## 2017-01-31 NOTE — Discharge Summary (Addendum)
[facility] [place] [facility]   PATIENT NAME: Alyssa Landry    MR#:  [id number]  DATE OF BIRTH:  October 08, 1918  DATE OF ADMISSION:  01/28/2017 ADMITTING PHYSICIAN: Shaune Pollack, MD  DATE OF DISCHARGE: 01/31/17  PRIMARY CARE PHYSICIAN: Rafael Bihari, MD    ADMISSION DIAGNOSIS:  Hyponatremia [E87.1] Weakness [R53.1] Community acquired pneumonia of left lower lobe of lung (HCC) [J18.1]  DISCHARGE DIAGNOSIS:  Left LL pneumonia Chronic hyponatremia Chronic respiratory failure/COPD on 2 L nasal cannula oxygen at home. Generalized weakness SECONDARY DIAGNOSIS:   Past Medical History:  Diagnosis Date  . A-fib (HCC)   . Acid reflux   . Anemia   . Anxiety   . Arthritis   . Asthma   . Chronic hyponatremia   . COPD (chronic obstructive pulmonary disease) (HCC)   . GERD (gastroesophageal reflux disease)   . Hiatal hernia   . Hiatal hernia   . Hyperlipidemia   . Hypertension   . Hypothyroidism   . Incontinence   . Migraine   . Osteoporosis, post-menopausal   . Persistent cough   . Pulmonary fibrosis (HCC)   . SIADH (syndrome of inappropriate ADH production) (HCC)   . TIA (transient ischemic attack)     HOSPITAL COURSE:  Alyssa Landry a 83 y.o.femalewith a known history of A. fib, hypertension, hyperlipidemia, anemia, COPD and chronic hyponatremia. The patient presents to the ED with the above chief complaints. She has had cough, dyspnea and generalized weakness for the past few days. She is on home oxygen 2 L. Chest x-ray show pneumonia.  1.Sepsis due to pneumonia  left LL -procalcitonin negative -came in with AMS, elevated Lactic acid and WBC -change to po levaquin  2.Hyperkalemia. Give Kayexalate and follow-up level. -5.7--4.7  3. Chronic Hyponatremia.  -stable counts -Continue salt tablets  4. Elevated troponin. Possible due to demanding ischemia.  -continue aspirin. -troponin 0.04--0.04 -No chest pain  5.Chronic  respiratory failure and COPD. Stable. Nebulizer when necessary.  -Continue home oxygen and oral inhalers  6.Hypertension. Continue clonidine and Cardizem  -Discontinued losartan -Change dose of clonidine to twice a day -Change Lasix to as needed  DNR-confirmed with dter in the room  Physical therapy recommends HHPT Overall hemodynamically stable. discharge later to home  CONSULTS OBTAINED:    DRUG ALLERGIES:   Allergies  Allergen Reactions  . Aspirin Other (See Comments)    Upset stomach with high doses  . Codeine Other (See Comments)    Reaction:  Unknown   . Ditropan [Oxybutynin] Other (See Comments)    Reaction:  Unknown   . Hctz [Hydrochlorothiazide] Other (See Comments)    Reaction:  Causes pts sodium/potassium levels to drop significantly   . Metronidazole Other (See Comments)    Reaction:  Unknown   . Propranolol Other (See Comments)    Reaction:  Unknown   . Tavist [Clemastine] Other (See Comments)    Reaction:  Unknown   . Tramadol Other (See Comments)    Reaction:  Unknown   . Vicodin [Hydrocodone-Acetaminophen] Other (See Comments)    Reaction:  Unknown     DISCHARGE MEDICATIONS:   Current Discharge Medication List    START taking these medications   Details  levofloxacin (LEVAQUIN) 250 MG tablet Take 1 tablet (250 mg total) by mouth daily. Qty: 5 tablet, Refills: 0      CONTINUE these medications which have CHANGED   Details  cloNIDine (CATAPRES) 0.1 MG tablet Take 1 tablet (0.1 mg total) by  mouth 2 (two) times daily. Qty: 60 tablet, Refills: 11    furosemide (LASIX) 20 MG tablet Take 1 tablet (20 mg total) by mouth every other day. Qty: 30 tablet, Refills: 2      CONTINUE these medications which have NOT CHANGED   Details  aspirin EC 81 MG EC tablet Take 1 tablet (81 mg total) by mouth daily.    atorvastatin (LIPITOR) 40 MG tablet Take 1 tablet (40 mg total) by mouth daily at 6 PM. Qty: 30 tablet, Refills: 2    cholecalciferol (VITAMIN  D) 1000 UNITS tablet Take 1,000 Units by mouth daily.    diltiazem (CARDIZEM CD) 120 MG 24 hr capsule Take 1 capsule (120 mg total) by mouth daily. Qty: 30 capsule, Refills: 0    docusate sodium (COLACE) 100 MG capsule Take 100 mg by mouth 2 (two) times daily.     ferrous sulfate 325 (65 FE) MG tablet Take 325 mg by mouth daily.     fluticasone (FLOVENT HFA) 110 MCG/ACT inhaler Inhale 2 puffs into the lungs 2 (two) times daily.    levothyroxine (SYNTHROID, LEVOTHROID) 125 MCG tablet Take 125 mcg by mouth daily before breakfast.    LORazepam (ATIVAN) 0.5 MG tablet Take 1 tablet (0.5 mg total) by mouth 3 (three) times daily. Qty: 30 tablet, Refills: 0    multivitamin-lutein (OCUVITE-LUTEIN) CAPS capsule Take 2 capsules by mouth daily.    Oxybutynin Chloride (GELNIQUE) 10 % GEL Place 1 application onto the skin daily. Qty: 30 g, Refills: 6   Associated Diagnoses: OAB (overactive bladder)    polyethylene glycol (MIRALAX / GLYCOLAX) packet Take 8.5-17 g by mouth daily. *Mix in 4-8 ounces of fluid prior to taking*    ranitidine (ZANTAC) 150 MG tablet Take 150 mg by mouth 2 (two) times daily before a meal.     sodium bicarbonate 650 MG tablet Take 325 mg by mouth 4 (four) times daily.    spironolactone (ALDACTONE) 25 MG tablet Take 25 mg by mouth daily.    tiotropium (SPIRIVA) 18 MCG inhalation capsule Place 18 mcg into inhaler and inhale daily.    vitamin B-12 (CYANOCOBALAMIN) 1000 MCG tablet Take 1,000 mcg by mouth daily.    acetaminophen (TYLENOL) 650 MG CR tablet Take 650 mg by mouth every 8 (eight) hours as needed for pain.    albuterol (PROVENTIL HFA;VENTOLIN HFA) 108 (90 BASE) MCG/ACT inhaler Inhale 1-2 puffs into the lungs every 4 (four) hours as needed for wheezing or shortness of breath.    budesonide (PULMICORT) 0.5 MG/2ML nebulizer solution Take 2 mLs (0.5 mg total) by nebulization 2 (two) times daily. Qty: 20 mL, Refills: 0    feeding supplement, ENSURE ENLIVE, (ENSURE  ENLIVE) LIQD Take 237 mLs by mouth daily.     triamcinolone (KENALOG) 0.025 % ointment Apply 1 application topically 2 (two) times daily. Qty: 30 g, Refills: 0      STOP taking these medications     losartan (COZAAR) 25 MG tablet      benzonatate (TESSALON) 100 MG capsule      cefdinir (OMNICEF) 250 MG/5ML suspension      guaiFENesin-dextromethorphan (ROBITUSSIN DM) 100-10 MG/5ML syrup      ipratropium-albuterol (DUONEB) 0.5-2.5 (3) MG/3ML SOLN      predniSONE (STERAPRED UNI-PAK 48 TAB) 10 MG (48) TBPK tablet         If you experience worsening of your admission symptoms, develop shortness of breath, life threatening emergency, suicidal or homicidal thoughts you must seek medical  attention immediately by calling 911 or calling your MD immediately  if symptoms less severe.  You Must read complete instructions/literature along with all the possible adverse reactions/side effects for all the Medicines you take and that have been prescribed to you. Take any new Medicines after you have completely understood and accept all the possible adverse reactions/side effects.   Please note  You were cared for by a hospitalist during your hospital stay. If you have any questions about your discharge medications or the care you received while you were in the hospital after you are discharged, you can call the unit and asked to speak with the hospitalist on call if the hospitalist that took care of you is not available. Once you are discharged, your primary care physician will handle any further medical issues. Please note that NO REFILLS for any discharge medications will be authorized once you are discharged, as it is imperative that you return to your primary care physician (or establish a relationship with a primary care physician if you do not have one) for your aftercare needs so that they can reassess your need for medications and monitor your lab values. Today   SUBJECTIVE   Patient doing  very well. no new complaints.  VITAL SIGNS:  Blood pressure 140/64, pulse 69, temperature 97.7 F (36.5 C), temperature source Oral, resp. rate 17, height 5\' 2"  (1.575 m), weight 47.2 kg (104 lb), SpO2 97 %.  I/O:    Intake/Output Summary (Last 24 hours) at 01/31/17 1001 Last data filed at 01/30/17 2000  Gross per 24 hour  Intake              120 ml  Output                0 ml  Net              120 ml    PHYSICAL EXAMINATION:  GENERAL:  83 y.o.-year-old patient lying in the bed with no acute distress.  EYES: Pupils equal, round, reactive to light and accommodation. No scleral icterus. Extraocular muscles intact.  HEENT: Head atraumatic, normocephalic. Oropharynx and nasopharynx clear.  NECK:  Supple, no jugular venous distention. No thyroid enlargement, no tenderness.  LUNGS: Distant breath sounds bilaterally, no wheezing, rales,rhonchi or crepitation. No use of accessory muscles of respiration.  CARDIOVASCULAR: S1, S2 normal. No murmurs, rubs, or gallops.  ABDOMEN: Soft, non-tender, non-distended. Bowel sounds present. No organomegaly or mass.  EXTREMITIES: No pedal edema, cyanosis, or clubbing.  NEUROLOGIC: Cranial nerves II through XII are intact. Muscle strength 5/5 in all extremities. Sensation intact. Gait not checked.  PSYCHIATRIC: The patient is alert and oriented x 3.  SKIN: No obvious rash, lesion, or ulcer.   DATA REVIEW:   CBC   Recent Labs Lab 01/29/17 0507  WBC 9.6  HGB 10.1*  HCT 29.8*  PLT 299    Chemistries   Recent Labs Lab 01/28/17 0951  01/29/17 0507 01/30/17 0500  NA 126*  < > 126* 126*  K 5.6*  < > 4.7  --   CL 83*  < > 88*  --   CO2 32  < > 29  --   GLUCOSE 104*  < > 159*  --   BUN 14  < > 16  --   CREATININE 0.54  < > 0.51  --   CALCIUM 9.9  < > 9.1  --   AST 29  --   --   --   ALT  14  --   --   --   ALKPHOS 98  --   --   --   BILITOT 0.8  --   --   --   < > = values in this interval not displayed.  Microbiology Results    Recent Results (from the past 240 hour(s))  MRSA PCR Screening     Status: Abnormal   Collection Time: 01/28/17  5:54 PM  Result Value Ref Range Status   MRSA by PCR POSITIVE (A) NEGATIVE Final    Comment:        The GeneXpert MRSA Assay (FDA approved for NASAL specimens only), is one component of a comprehensive MRSA colonization surveillance program. It is not intended to diagnose MRSA infection nor to guide or monitor treatment for MRSA infections. RESULT CALLED TO, READ BACK BY AND VERIFIED WITH: DRUCILLA JACKSON 01/28/17 1953 KLW     RADIOLOGY:  No results found.   Management plans discussed with the patient, family and they are in agreement.  CODE STATUS:     Code Status Orders        Start     Ordered   01/28/17 1524  Do not attempt resuscitation (DNR)  Continuous    Question Answer Comment  In the event of cardiac or respiratory ARREST Do not call a "code blue"   In the event of cardiac or respiratory ARREST Do not perform Intubation, CPR, defibrillation or ACLS   In the event of cardiac or respiratory ARREST Use medication by any route, position, wound care, and other measures to relive pain and suffering. May use oxygen, suction and manual treatment of airway obstruction as needed for comfort.      01/28/17 1523    Code Status History    Date Active Date Inactive Code Status Order ID Comments User Context   10/09/2016  1:32 AM 10/12/2016  8:46 PM DNR [id number]  Hugelmeyer, Jon Gills, DO Inpatient   10/09/2016  1:21 AM 10/09/2016  1:32 AM Full Code [id number]  Hugelmeyer, Jon Gills, DO ED   09/04/2016  6:54 PM 09/05/2016  6:47 PM DNR [id number]  Adrian Saran, MD Inpatient   08/20/2016  5:11 PM 08/22/2016  6:30 PM DNR [id number]  Altamese Dilling, MD Inpatient   05/01/2016  6:48 PM 05/04/2016  7:55 PM DNR [id number]  Enedina Finner, MD Inpatient   02/18/2016 11:10 PM 02/25/2016  6:31 PM DNR [id number]  Tonye Royalty, DO Inpatient   01/26/2016  9:56 PM 01/30/2016  7:34 PM DNR  [id number]  Houston Siren, MD Inpatient   12/11/2015  9:29 PM 12/15/2015  8:13 PM DNR [id number]  Ramonita Lab, MD Inpatient   09/25/2015  4:17 PM 09/26/2015 10:07 PM DNR [id number]  Altamese Dilling, MD ED    Advance Directive Documentation     Most Recent Value  Type of Advance Directive  Healthcare Power of Attorney, Out of facility DNR (pink MOST or yellow form)  Pre-existing out of facility DNR order (yellow form or pink MOST form)  Pink MOST form placed in chart (order not valid for inpatient use)  "MOST" Form in Place?  -      TOTAL TIME TAKING CARE OF THIS PATIENT: *40* minutes.    Kamani Lewter M.D on 01/31/2017 at 10:01 AM  Between 7am to 6pm - Pager - [phone] After 6pm go to [email or web address] - password [email or web address]  [facility]  Office  [phone]  CC: Primary care physician; Rafael Bihari, MD

## 2017-01-31 NOTE — Discharge Instructions (Signed)

## 2017-01-31 NOTE — Progress Notes (Signed)
Discharge instructions discussed with pt  And pts dtr.  meds  Sent to pts pharmacy. Home meds discussed.  Diet / activity and f/u discussed. Spoke with dtr  Re  Potential  S/s chf and when to  Call md.  Verbalize understanding.  No voiced c/o.  Dressing  Changed to  pts rt elbow at skin tear site. Site intact and non bleeding. Home at this time  Via w/c with dtr w/o c/o. 02 in use at 2 l [place].

## 2017-02-14 ENCOUNTER — Emergency Department: Payer: Medicare Other

## 2017-02-14 ENCOUNTER — Emergency Department
Admission: EM | Admit: 2017-02-14 | Discharge: 2017-02-14 | Disposition: A | Discharge status: Home / Self Care | Payer: Medicare Other | Attending: Emergency Medicine | Admitting: Emergency Medicine

## 2017-02-14 DIAGNOSIS — I4891 Unspecified atrial fibrillation: Secondary | ICD-10-CM | POA: Diagnosis not present

## 2017-02-14 DIAGNOSIS — R748 Abnormal levels of other serum enzymes: Secondary | ICD-10-CM | POA: Diagnosis not present

## 2017-02-14 DIAGNOSIS — R4182 Altered mental status, unspecified: Secondary | ICD-10-CM | POA: Insufficient documentation

## 2017-02-14 DIAGNOSIS — Z79899 Other long term (current) drug therapy: Secondary | ICD-10-CM | POA: Insufficient documentation

## 2017-02-14 DIAGNOSIS — I110 Hypertensive heart disease with heart failure: Secondary | ICD-10-CM | POA: Diagnosis not present

## 2017-02-14 DIAGNOSIS — G459 Transient cerebral ischemic attack, unspecified: Secondary | ICD-10-CM | POA: Diagnosis not present

## 2017-02-14 DIAGNOSIS — J449 Chronic obstructive pulmonary disease, unspecified: Secondary | ICD-10-CM | POA: Insufficient documentation

## 2017-02-14 DIAGNOSIS — I5032 Chronic diastolic (congestive) heart failure: Secondary | ICD-10-CM | POA: Insufficient documentation

## 2017-02-14 DIAGNOSIS — Z8673 Personal history of transient ischemic attack (TIA), and cerebral infarction without residual deficits: Secondary | ICD-10-CM | POA: Insufficient documentation

## 2017-02-14 DIAGNOSIS — R778 Other specified abnormalities of plasma proteins: Secondary | ICD-10-CM

## 2017-02-14 DIAGNOSIS — Z7982 Long term (current) use of aspirin: Secondary | ICD-10-CM | POA: Diagnosis not present

## 2017-02-14 DIAGNOSIS — R7989 Other specified abnormal findings of blood chemistry: Secondary | ICD-10-CM

## 2017-02-14 LAB — CBC WITH DIFFERENTIAL/PLATELET
BASOS PCT: 1 %
Basophils Absolute: 0.1 10*3/uL (ref 0–0.1)
EOS ABS: 0.1 10*3/uL (ref 0–0.7)
Eosinophils Relative: 1 %
HCT: 30.3 % — ABNORMAL LOW (ref 35.0–47.0)
Hemoglobin: 10.4 g/dL — ABNORMAL LOW (ref 12.0–16.0)
Lymphocytes Relative: 10 %
Lymphs Abs: 0.7 10*3/uL — ABNORMAL LOW (ref 1.0–3.6)
MCH: 29.1 pg (ref 26.0–34.0)
MCHC: 34.4 g/dL (ref 32.0–36.0)
MCV: 84.8 fL (ref 80.0–100.0)
MONO ABS: 0.7 10*3/uL (ref 0.2–0.9)
MONOS PCT: 10 %
Neutro Abs: 5.2 10*3/uL (ref 1.4–6.5)
Neutrophils Relative %: 78 %
Platelets: 246 10*3/uL (ref 150–440)
RBC: 3.57 MIL/uL — ABNORMAL LOW (ref 3.80–5.20)
RDW: 14.1 % (ref 11.5–14.5)
WBC: 6.7 10*3/uL (ref 3.6–11.0)

## 2017-02-14 LAB — URINALYSIS, COMPLETE (UACMP) WITH MICROSCOPIC
Bacteria, UA: NONE SEEN
Bilirubin Urine: NEGATIVE
GLUCOSE, UA: NEGATIVE mg/dL
Hgb urine dipstick: NEGATIVE
Ketones, ur: NEGATIVE mg/dL
Leukocytes, UA: NEGATIVE
NITRITE: NEGATIVE
PH: 7 (ref 5.0–8.0)
PROTEIN: NEGATIVE mg/dL
Specific Gravity, Urine: 1.011 (ref 1.005–1.030)
Squamous Epithelial / LPF: NONE SEEN

## 2017-02-14 LAB — BASIC METABOLIC PANEL
Anion gap: 7 (ref 5–15)
BUN: 16 mg/dL (ref 6–20)
CALCIUM: 9.5 mg/dL (ref 8.9–10.3)
CO2: 34 mmol/L — ABNORMAL HIGH (ref 22–32)
Chloride: 89 mmol/L — ABNORMAL LOW (ref 101–111)
Creatinine, Ser: 0.54 mg/dL (ref 0.44–1.00)
GFR calc Af Amer: >60 mL/min (ref >60)
GLUCOSE: 126 mg/dL — AB (ref 65–99)
Potassium: 4.1 mmol/L (ref 3.5–5.1)
Sodium: 130 mmol/L — ABNORMAL LOW (ref 135–145)

## 2017-02-14 LAB — TROPONIN I
Troponin I: 0.13 ng/mL (ref <0.03)
Troponin I: 0.13 ng/mL (ref <0.03)

## 2017-02-14 NOTE — ED Notes (Signed)
Report given to Alicia RN.

## 2017-02-14 NOTE — ED Triage Notes (Signed)
Pt presents via EMS with AMS. EMS reports pt ambulated to restroom using walking and shortly after family noticed pt was not at baseline. Not smiling or laughing per EMS report. Pt verbal currently. Reports being cold. Abd distended. Pt has hx a fib and hemorrhagic CVA per EMS report, not on anticoagulants per report. Pending family arrival.

## 2017-02-14 NOTE — ED Notes (Signed)
Patient transported to CT 

## 2017-02-14 NOTE — Discharge Instructions (Signed)
We discussed hospital observation admission versus discharge home with improved evaluation here in the emergency department and on current medical therapy for stroke already.  We discussed holding off on changing the dose of aspirin given history of bleeding stroke in the past.  We discussed abnormal slight elevation of heart enzyme, please follow-up with your primary care doctor, and you are referred for cardiology appointment.  Return to the emergency room immediately for any worsening symptoms including weakness, numbness, confusion altered mental status, chest pain, or any other symptoms concerning to you.

## 2017-02-14 NOTE — ED Provider Notes (Addendum)
[facility] Emergency Department Provider Note ____________________________________________   I have reviewed the triage vital signs and the triage nursing note.  HISTORY  Chief Complaint Altered Mental Status   Historian Patient, patient's daughter  HPI Alyssa Landry is a 83 y.o. female who lives at home with her daughter, has a history of A. fib, as well as at least 2 recent strokes this year 1 of them with hemorrhagic conversion, currently taking baby aspirin daily, presents with complaint of possible TIA. Next patient was apparently walking down the hallway with her walker and daughter noticed that she seemed a little off balance and then the patient was unable to speak to her, sound like this was probably less than 15 minutes and by the time she came to the ED she was talking and conversing. Daughter agrees that she is back to her baseline.  No additional focal weakness or numbness. No vision complaints. No pain.    Past Medical History:  Diagnosis Date  . A-fib (HCC)   . Acid reflux   . Anemia   . Anxiety   . Arthritis   . Asthma   . Chronic hyponatremia   . COPD (chronic obstructive pulmonary disease) (HCC)   . GERD (gastroesophageal reflux disease)   . Hiatal hernia   . Hiatal hernia   . Hyperlipidemia   . Hypertension   . Hypothyroidism   . Incontinence   . Migraine   . Osteoporosis, post-menopausal   . Persistent cough   . Pulmonary fibrosis (HCC)   . SIADH (syndrome of inappropriate ADH production) (HCC)   . TIA (transient ischemic attack)     Patient Active Problem List   Diagnosis Date Noted  . Sepsis (HCC) 01/28/2017  . HCAP (healthcare-associated pneumonia) 10/08/2016  . TIA (transient ischemic attack) 09/04/2016  . Stroke (HCC) 08/20/2016  . CHF (congestive heart failure) (HCC) 05/01/2016  . Chronic diastolic heart failure (HCC) 04/16/2016  . HTN (hypertension) 04/16/2016  . Hypoxia   . SOB (shortness of breath)   .  Collapse of left lung   . Pleural effusion   . Centrilobular emphysema (HCC)   . Hyponatremia   . Pressure ulcer 02/19/2016  . Atrial fibrillation with RVR (HCC) 12/11/2015  . Urge incontinence 09/26/2015  . Lichenified rash 09/26/2015  . Syncopal episodes 09/25/2015    Past Surgical History:  Procedure Laterality Date  . ABDOMINAL HYSTERECTOMY  1966    Prior to Admission medications   Medication Sig Start Date End Date Taking? Authorizing Provider  acetaminophen (TYLENOL) 650 MG CR tablet Take 650 mg by mouth every 8 (eight) hours as needed for pain.   Yes [provider]  albuterol (PROVENTIL HFA;VENTOLIN HFA) 108 (90 BASE) MCG/ACT inhaler Inhale 1-2 puffs into the lungs every 4 (four) hours as needed for wheezing or shortness of breath.   Yes [provider]  aspirin EC 81 MG EC tablet Take 1 tablet (81 mg total) by mouth daily. 09/06/16  Yes Milagros Loll, MD  atorvastatin (LIPITOR) 40 MG tablet Take 1 tablet (40 mg total) by mouth daily at 6 PM. 08/22/16  Yes Enid Baas, MD  cholecalciferol (VITAMIN D) 1000 UNITS tablet Take 1,000 Units by mouth daily.   Yes [provider]  cloNIDine (CATAPRES) 0.1 MG tablet Take 1 tablet (0.1 mg total) by mouth 2 (two) times daily. 01/30/17  Yes Enedina Finner, MD  diltiazem (CARDIZEM CD) 120 MG 24 hr capsule Take 1 capsule (120 mg total) by mouth daily.  12/15/15  Yes Hower, Cletis Athensavid K, MD  docusate sodium (COLACE) 100 MG capsule Take 100 mg by mouth 2 (two) times daily.    Yes [provider]  feeding supplement, ENSURE ENLIVE, (ENSURE ENLIVE) LIQD Take 237 mLs by mouth daily.    Yes [provider]  ferrous sulfate 325 (65 FE) MG tablet Take 325 mg by mouth daily.    Yes [provider]  fluticasone (FLOVENT HFA) 110 MCG/ACT inhaler Inhale 2 puffs into the lungs 2 (two) times daily. 11/13/16 11/13/17 Yes [provider]  furosemide (LASIX) 20 MG tablet Take 1 tablet (20 mg total) by mouth  every other day. 01/31/17  Yes Enedina FinnerPatel, Sona, MD  levothyroxine (SYNTHROID, LEVOTHROID) 125 MCG tablet Take 125 mcg by mouth daily before breakfast.   Yes [provider]  LORazepam (ATIVAN) 0.5 MG tablet Take 1 tablet (0.5 mg total) by mouth 3 (three) times daily. 10/12/16  Yes Enid BaasKalisetti, Radhika, MD  multivitamin-lutein [facility](OCUVITE-LUTEIN) CAPS capsule Take 2 capsules by mouth daily.   Yes [provider]  Oxybutynin Chloride (GELNIQUE) 10 % GEL Place 1 application onto the skin daily. 01/27/17  Yes McGowan, Carollee HerterShannon A, PA-C  polyethylene glycol (MIRALAX / GLYCOLAX) packet Take 8.5-17 g by mouth daily. *Mix in 4-8 ounces of fluid prior to taking*   Yes [provider]  ranitidine (ZANTAC) 150 MG tablet Take 150 mg by mouth 2 (two) times daily before a meal.    Yes [provider]  sodium bicarbonate 650 MG tablet Take 325 mg by mouth 4 (four) times daily.   Yes [provider]  spironolactone (ALDACTONE) 25 MG tablet Take 25 mg by mouth daily.   Yes [provider]  tiotropium (SPIRIVA) 18 MCG inhalation capsule Place 18 mcg into inhaler and inhale daily.   Yes [provider]  vitamin B-12 (CYANOCOBALAMIN) 1000 MCG tablet Take 1,000 mcg by mouth daily.   Yes [provider]  budesonide (PULMICORT) 0.5 MG/2ML nebulizer solution Take 2 mLs (0.5 mg total) by nebulization 2 (two) times daily. Patient not taking: Reported on 01/28/2017 10/12/16   Enid BaasKalisetti, Radhika, MD  levofloxacin (LEVAQUIN) 250 MG tablet Take 1 tablet (250 mg total) by mouth daily. Patient not taking: Reported on 02/14/2017 01/30/17   Enedina FinnerPatel, Sona, MD  triamcinolone (KENALOG) 0.025 % ointment Apply 1 application topically 2 (two) times daily. Patient not taking: Reported on 02/14/2017 09/22/16   Michiel CowboyMcGowan, Shannon A, PA-C    Allergies  Allergen Reactions  . Aspirin Other (See Comments)    Upset stomach with high doses  . Codeine Other (See Comments)    Reaction:  Unknown   .  Ditropan [Oxybutynin] Other (See Comments)    Reaction:  Unknown   . Hctz [Hydrochlorothiazide] Other (See Comments)    Reaction:  Causes pts sodium/potassium levels to drop significantly   . Metronidazole Other (See Comments)    Reaction:  Unknown   . Propranolol Other (See Comments)    Reaction:  Unknown   . Tavist [Clemastine] Other (See Comments)    Reaction:  Unknown   . Tramadol Other (See Comments)    Reaction:  Unknown   . Vicodin [Hydrocodone-Acetaminophen] Other (See Comments)    Reaction:  Unknown     Family History  Problem Relation Age of Onset  . Leukemia Mother   . Heart disease Father   . Hypertension Unknown     Social History Social History  Substance Use Topics  . Smoking status: Never Smoker  .  Smokeless tobacco: Never Used  . Alcohol use No    Review of Systems  Constitutional: Negative for fever. Eyes: Negative for visual changes. ENT: Negative for sore throat. Cardiovascular: Negative for chest pain. Respiratory: Negative for shortness of breath. Gastrointestinal: Negative for abdominal pain, vomiting and diarrhea. Genitourinary: Negative for dysuria. Musculoskeletal: Negative for back pain. Skin: Negative for rash. Neurological: Negative for headache.  ____________________________________________   PHYSICAL EXAM:  VITAL SIGNS: ED Triage Vitals  Enc Vitals Group     BP 02/14/17 1100 136/73     Pulse Rate 02/14/17 1100 78     Resp 02/14/17 1100 19     Temp --      Temp src --      SpO2 02/14/17 1100 100 %     Weight 02/14/17 0945 106 lb 11.2 oz (48.4 kg)     Height 02/14/17 0945 5\' 1"  (1.549 m)     Head Circumference --      Peak Flow --      Pain Score --      Pain Loc --      Pain Edu? --      Excl. in GC? --      Constitutional: Alert and Cooperative. Well appearing and in no distress. HEENT   Head: Normocephalic and atraumatic.      Eyes: Conjunctivae are normal. Pupils equal and round.       Ears:         Nose: No  congestion/rhinnorhea.   Mouth/Throat: Mucous membranes are moist.   Neck: No stridor. Cardiovascular/Chest: Normal rate, regular rhythm.  No murmurs, rubs, or gallops. Respiratory: Normal respiratory effort without tachypnea nor retractions. Breath sounds are clear and equal bilaterally. No wheezes/rales/rhonchi. Gastrointestinal: Soft. No distention, no guarding, no rebound. Nontender.    Genitourinary/rectal:Deferred Musculoskeletal: Nontender with normal range of motion in all extremities. No joint effusions.  No lower extremity tenderness.  No edema. Neurologic: No facial droop. Normal speech and language. No gross or focal neurologic deficits are appreciated. Skin:  Skin is warm, dry and intact. No rash noted. Psychiatric: Mood and affect are normal. Speech and behavior are normal. Patient exhibits appropriate insight and judgment.   ____________________________________________  LABS (pertinent positives/negatives)  Labs Reviewed  BASIC METABOLIC PANEL - Abnormal; Notable for the following:       Result Value   Sodium 130 (*)    Chloride 89 (*)    CO2 34 (*)    Glucose, Bld 126 (*)    All other components within normal limits  TROPONIN I - Abnormal; Notable for the following:    Troponin I 0.13 (*)    All other components within normal limits  CBC WITH DIFFERENTIAL/PLATELET - Abnormal; Notable for the following:    RBC 3.57 (*)    Hemoglobin 10.4 (*)    HCT 30.3 (*)    Lymphs Abs 0.7 (*)    All other components within normal limits  URINALYSIS, COMPLETE (UACMP) WITH MICROSCOPIC - Abnormal; Notable for the following:    Color, Urine YELLOW (*)    APPearance HAZY (*)    All other components within normal limits  TROPONIN I - Abnormal; Notable for the following:    Troponin I 0.13 (*)    All other components within normal limits    ____________________________________________    EKG I, Governor Rooksebecca Caroleena Paolini, MD, the attending physician have personally viewed and  interpreted all ECGs.  90 bpm. Irregularly irregular likely atrial fibrillation.  Repeat EKG. 80 bpm.  Irregularly irregular consistent with atrial fibrillation. Incomplete right bundle branch block with left anterior fascicular block. Left axis deviation. Nonspecific ST and T wave without ST segment elevation.  Some nonspecific findings are similar to prior EKGs. T waves are inverted in V5 and V6(which is somewhat different from prior. ____________________________________________  RADIOLOGY All Xrays were viewed by me. Imaging interpreted by Radiologist.  Chest x-ray two-view:  IMPRESSION: No acute finding when compared to prior. Left pleural effusion and interstitial opacities have decreased since 01/28/2017.  Chronic cardiomegaly.  CT without contrast:  IMPRESSION: 1. No acute finding or change from prior. 2. Remote moderate infarcts in the right frontal and left occipital lobes.  __________________________________________  PROCEDURES  Procedure(s) performed: None  Critical Care performed: None  ____________________________________________   ED COURSE / ASSESSMENT AND PLAN  Pertinent labs & imaging results that were available during my care of the patient were reviewed by me and considered in my medical decision making (see chart for details).   Ms. Gensel has no neurologic deficits at present although there was description of trouble finding words earlier consistent with possible TIA.  Symptoms are resolved at this point and patient is not made a code stroke as there is improvement of symptoms which are now resolved.  Laboratory studies are overall reassuring with no major change from prior other than troponin minimally elevated 0.13. EKG is nonspecific and repeat EKG will be evaluated. I discussed with the daughter the next steps and management both with respect to the TIA as well as minimally elevated troponin.  Because of her history of prior hemorrhagic  stroke, she states her neurologist had recommended not going above BB aspirin dosing. Family is concerned about insurance coverage for observation status and would like to go home if at all possible. We decided to recheck the troponin at 4 hours and if not going up, family would like to go home. If troponin is significantly elevated, they are agreeable to stay in the hospital and increase the aspirin dose at least in the interim.  Repeat EKG is unchanged from earlier today, when compared with prior EKGs, she is in A. fib with similar QRS morphology, she has T waves inverted laterally which look somewhat different from previous. I discussed this again with the patient's daughter, and given the patient's age and whether or not she would have intervention, this would be quite unlikely given her medical comorbidities. Jamesetta So decided that if the troponin is not significantly increasing, they would rather take her home. I think this is reasonable given the whole scenario.   Repeat troponin is unchanged at 0.13. We discussed again, family would prefer to take her home and I think this is reasonable.    CONSULTATIONS:   None   Patient / Family / Caregiver informed of clinical course, medical decision-making process, and agree with plan.   I discussed return precautions, follow-up instructions, and discharge instructions with patient and/or family.  Discharge Instructions : We discussed hospital observation admission versus discharge home with improved evaluation here in the emergency department and on current medical therapy for stroke already.  We discussed holding off on changing the dose of aspirin given history of bleeding stroke in the past.  We discussed abnormal slight elevation of heart enzyme, please follow-up with your primary care doctor, and you are referred for cardiology appointment.  Return to the emergency room immediately for any worsening symptoms including weakness, numbness,  confusion altered mental status, chest pain, or any other symptoms concerning to you.  ___________________________________________   FINAL CLINICAL IMPRESSION(S) / ED DIAGNOSES   Final diagnoses:  Troponin level elevated  Transient cerebral ischemia, unspecified type              Note: This dictation was prepared with Dragon dictation. Any transcriptional errors that result from this process are unintentional    Governor Rooks, MD 02/14/17 1416    Governor Rooks, MD 02/14/17 1535

## 2017-02-14 NOTE — ED Notes (Signed)

## 2017-03-12 ENCOUNTER — Other Ambulatory Visit: Payer: Self-pay

## 2017-03-12 ENCOUNTER — Emergency Department: Payer: Medicare Other

## 2017-03-12 ENCOUNTER — Encounter: Payer: Self-pay | Admitting: Emergency Medicine

## 2017-03-12 ENCOUNTER — Inpatient Hospital Stay
Admission: EM | Admit: 2017-03-12 | Discharge: 2017-04-12 | DRG: 064 | Disposition: E | Payer: Medicare Other | Attending: Internal Medicine | Admitting: Internal Medicine

## 2017-03-12 DIAGNOSIS — M810 Age-related osteoporosis without current pathological fracture: Secondary | ICD-10-CM | POA: Diagnosis present

## 2017-03-12 DIAGNOSIS — R51 Headache: Secondary | ICD-10-CM | POA: Diagnosis not present

## 2017-03-12 DIAGNOSIS — E039 Hypothyroidism, unspecified: Secondary | ICD-10-CM | POA: Diagnosis present

## 2017-03-12 DIAGNOSIS — Z888 Allergy status to other drugs, medicaments and biological substances status: Secondary | ICD-10-CM

## 2017-03-12 DIAGNOSIS — E785 Hyperlipidemia, unspecified: Secondary | ICD-10-CM | POA: Diagnosis present

## 2017-03-12 DIAGNOSIS — Z7951 Long term (current) use of inhaled steroids: Secondary | ICD-10-CM

## 2017-03-12 DIAGNOSIS — Z885 Allergy status to narcotic agent status: Secondary | ICD-10-CM

## 2017-03-12 DIAGNOSIS — H538 Other visual disturbances: Secondary | ICD-10-CM | POA: Diagnosis present

## 2017-03-12 DIAGNOSIS — Z66 Do not resuscitate: Secondary | ICD-10-CM | POA: Diagnosis present

## 2017-03-12 DIAGNOSIS — Z7982 Long term (current) use of aspirin: Secondary | ICD-10-CM

## 2017-03-12 DIAGNOSIS — R519 Headache, unspecified: Secondary | ICD-10-CM

## 2017-03-12 DIAGNOSIS — L8990 Pressure ulcer of unspecified site, unspecified stage: Secondary | ICD-10-CM | POA: Insufficient documentation

## 2017-03-12 DIAGNOSIS — Z8249 Family history of ischemic heart disease and other diseases of the circulatory system: Secondary | ICD-10-CM

## 2017-03-12 DIAGNOSIS — H5462 Unqualified visual loss, left eye, normal vision right eye: Secondary | ICD-10-CM | POA: Diagnosis present

## 2017-03-12 DIAGNOSIS — I110 Hypertensive heart disease with heart failure: Secondary | ICD-10-CM | POA: Diagnosis present

## 2017-03-12 DIAGNOSIS — L89321 Pressure ulcer of left buttock, stage 1: Secondary | ICD-10-CM | POA: Diagnosis present

## 2017-03-12 DIAGNOSIS — L89311 Pressure ulcer of right buttock, stage 1: Secondary | ICD-10-CM | POA: Diagnosis present

## 2017-03-12 DIAGNOSIS — I639 Cerebral infarction, unspecified: Principal | ICD-10-CM | POA: Diagnosis present

## 2017-03-12 DIAGNOSIS — Z806 Family history of leukemia: Secondary | ICD-10-CM

## 2017-03-12 DIAGNOSIS — I4891 Unspecified atrial fibrillation: Secondary | ICD-10-CM | POA: Diagnosis present

## 2017-03-12 DIAGNOSIS — R140 Abdominal distension (gaseous): Secondary | ICD-10-CM

## 2017-03-12 DIAGNOSIS — K219 Gastro-esophageal reflux disease without esophagitis: Secondary | ICD-10-CM | POA: Diagnosis present

## 2017-03-12 DIAGNOSIS — R059 Cough, unspecified: Secondary | ICD-10-CM

## 2017-03-12 DIAGNOSIS — J8410 Pulmonary fibrosis, unspecified: Secondary | ICD-10-CM | POA: Diagnosis present

## 2017-03-12 DIAGNOSIS — R05 Cough: Secondary | ICD-10-CM

## 2017-03-12 DIAGNOSIS — M1990 Unspecified osteoarthritis, unspecified site: Secondary | ICD-10-CM | POA: Diagnosis present

## 2017-03-12 DIAGNOSIS — Z90710 Acquired absence of both cervix and uterus: Secondary | ICD-10-CM

## 2017-03-12 DIAGNOSIS — R0682 Tachypnea, not elsewhere classified: Secondary | ICD-10-CM

## 2017-03-12 DIAGNOSIS — Z515 Encounter for palliative care: Secondary | ICD-10-CM | POA: Diagnosis present

## 2017-03-12 DIAGNOSIS — R29702 NIHSS score 2: Secondary | ICD-10-CM | POA: Diagnosis present

## 2017-03-12 DIAGNOSIS — J449 Chronic obstructive pulmonary disease, unspecified: Secondary | ICD-10-CM | POA: Diagnosis present

## 2017-03-12 DIAGNOSIS — I5031 Acute diastolic (congestive) heart failure: Secondary | ICD-10-CM | POA: Diagnosis present

## 2017-03-12 DIAGNOSIS — I69398 Other sequelae of cerebral infarction: Secondary | ICD-10-CM

## 2017-03-12 DIAGNOSIS — Z79899 Other long term (current) drug therapy: Secondary | ICD-10-CM

## 2017-03-12 DIAGNOSIS — R0689 Other abnormalities of breathing: Secondary | ICD-10-CM

## 2017-03-12 DIAGNOSIS — E222 Syndrome of inappropriate secretion of antidiuretic hormone: Secondary | ICD-10-CM | POA: Diagnosis present

## 2017-03-12 MED ORDER — SPIRONOLACTONE 25 MG PO TABS
25.0000 mg | ORAL_TABLET | Freq: Every day | ORAL | Status: DC
  Administered 2017-03-13: 09:00:00 25 mg via ORAL
  Filled 2017-03-12 (×3): qty 1

## 2017-03-12 MED ORDER — SODIUM CHLORIDE 0.9 % IV SOLN
INTRAVENOUS | Status: DC
  Administered 2017-03-13: 02:00:00 via INTRAVENOUS

## 2017-03-12 MED ORDER — POLYETHYLENE GLYCOL 3350 17 G PO PACK
8.5000 g | PACK | Freq: Every day | ORAL | Status: DC
  Administered 2017-03-13 – 2017-03-15 (×3): 17 g via ORAL
  Filled 2017-03-12 (×3): qty 1

## 2017-03-12 MED ORDER — ASPIRIN EC 81 MG PO TBEC
81.0000 mg | DELAYED_RELEASE_TABLET | Freq: Every day | ORAL | Status: DC
  Administered 2017-03-13 – 2017-03-15 (×3): 81 mg via ORAL
  Filled 2017-03-12 (×3): qty 1

## 2017-03-12 MED ORDER — ASPIRIN 81 MG PO CHEW
324.0000 mg | CHEWABLE_TABLET | Freq: Once | ORAL | Status: AC
  Administered 2017-03-12: 324 mg via ORAL
  Filled 2017-03-12: qty 4

## 2017-03-12 MED ORDER — FLUTICASONE PROPIONATE HFA 110 MCG/ACT IN AERO: 2.0000 | INHALATION_SPRAY | Freq: Two times a day (BID) | RESPIRATORY_TRACT | Status: DC

## 2017-03-12 MED ORDER — DOCUSATE SODIUM 100 MG PO CAPS
100.0000 mg | ORAL_CAPSULE | Freq: Two times a day (BID) | ORAL | Status: DC
  Administered 2017-03-13 – 2017-03-15 (×5): 100 mg via ORAL
  Filled 2017-03-12 (×6): qty 1

## 2017-03-12 MED ORDER — FERROUS SULFATE 325 (65 FE) MG PO TABS
325.0000 mg | ORAL_TABLET | Freq: Every day | ORAL | Status: DC
  Administered 2017-03-13 – 2017-03-14 (×2): 325 mg via ORAL
  Filled 2017-03-12 (×4): qty 1

## 2017-03-12 MED ORDER — OXYBUTYNIN CHLORIDE 10 % TD GEL
1.0000 | Freq: Every day | TRANSDERMAL | Status: DC
  Administered 2017-03-15: 1 via TRANSDERMAL

## 2017-03-12 MED ORDER — DILTIAZEM HCL ER COATED BEADS 120 MG PO CP24
120.0000 mg | ORAL_CAPSULE | Freq: Every day | ORAL | Status: DC
  Administered 2017-03-13 – 2017-03-15 (×3): 120 mg via ORAL
  Filled 2017-03-12 (×4): qty 1

## 2017-03-12 MED ORDER — FAMOTIDINE 20 MG PO TABS
20.0000 mg | ORAL_TABLET | Freq: Two times a day (BID) | ORAL | Status: DC
  Administered 2017-03-13 – 2017-03-15 (×6): 20 mg via ORAL
  Filled 2017-03-12 (×6): qty 1

## 2017-03-12 MED ORDER — PANTOPRAZOLE SODIUM 40 MG IV SOLR
40.0000 mg | Freq: Once | INTRAVENOUS | Status: AC
  Administered 2017-03-12: 40 mg via INTRAVENOUS
  Filled 2017-03-12: qty 40

## 2017-03-12 MED ORDER — STROKE: EARLY STAGES OF RECOVERY BOOK
Freq: Once | Status: AC
Start: 2017-03-13 — End: 2017-03-13
  Administered 2017-03-13

## 2017-03-12 MED ORDER — TIOTROPIUM BROMIDE MONOHYDRATE 18 MCG IN CAPS
18.0000 ug | ORAL_CAPSULE | Freq: Every day | RESPIRATORY_TRACT | Status: DC
  Administered 2017-03-13 – 2017-03-15 (×3): 18 ug via RESPIRATORY_TRACT
  Filled 2017-03-12: qty 5

## 2017-03-12 MED ORDER — VITAMIN B-12 1000 MCG PO TABS
1000.0000 ug | ORAL_TABLET | Freq: Every day | ORAL | Status: DC
  Administered 2017-03-13 – 2017-03-14 (×2): 1000 ug via ORAL
  Filled 2017-03-12 (×3): qty 1

## 2017-03-12 MED ORDER — ALBUTEROL SULFATE (2.5 MG/3ML) 0.083% IN NEBU
3.0000 mL | INHALATION_SOLUTION | RESPIRATORY_TRACT | Status: DC | PRN
  Administered 2017-03-13 – 2017-03-16 (×2): 3 mL via RESPIRATORY_TRACT
  Filled 2017-03-12 (×3): qty 3

## 2017-03-12 MED ORDER — VITAMIN D 1000 UNITS PO TABS
1000.0000 [IU] | ORAL_TABLET | Freq: Every day | ORAL | Status: DC
  Administered 2017-03-13 – 2017-03-14 (×2): 1000 [IU] via ORAL
  Filled 2017-03-12 (×3): qty 1

## 2017-03-12 MED ORDER — ACETAMINOPHEN 325 MG PO TABS: 650.0000 mg | ORAL_TABLET | Freq: Three times a day (TID) | ORAL | Status: DC | PRN

## 2017-03-12 MED ORDER — FUROSEMIDE 20 MG PO TABS
20.0000 mg | ORAL_TABLET | ORAL | Status: DC
  Administered 2017-03-14: 11:00:00 20 mg via ORAL
  Filled 2017-03-12: qty 1

## 2017-03-12 MED ORDER — ENOXAPARIN SODIUM 40 MG/0.4ML ~~LOC~~ SOLN: 40.0000 mg | SUBCUTANEOUS | Status: DC

## 2017-03-12 MED ORDER — OCUVITE-LUTEIN PO CAPS
2.0000 | ORAL_CAPSULE | Freq: Every day | ORAL | Status: DC
  Administered 2017-03-13: 1 via ORAL
  Filled 2017-03-12 (×4): qty 2

## 2017-03-12 MED ORDER — ATORVASTATIN CALCIUM 20 MG PO TABS
40.0000 mg | ORAL_TABLET | Freq: Every day | ORAL | Status: DC
  Administered 2017-03-13 – 2017-03-15 (×3): 40 mg via ORAL
  Filled 2017-03-12 (×4): qty 2

## 2017-03-12 MED ORDER — ENSURE ENLIVE PO LIQD
237.0000 mL | Freq: Every day | ORAL | Status: DC
  Administered 2017-03-13 – 2017-03-15 (×2): 237 mL via ORAL

## 2017-03-12 MED ORDER — LEVOTHYROXINE SODIUM 25 MCG PO TABS
125.0000 ug | ORAL_TABLET | Freq: Every day | ORAL | Status: DC
  Administered 2017-03-13 – 2017-03-15 (×3): 125 ug via ORAL
  Filled 2017-03-12 (×3): qty 1

## 2017-03-12 MED ORDER — SODIUM BICARBONATE 650 MG PO TABS
325.0000 mg | ORAL_TABLET | Freq: Four times a day (QID) | ORAL | Status: DC
  Administered 2017-03-13 – 2017-03-15 (×10): 325 mg via ORAL
  Filled 2017-03-12 (×14): qty 0.5

## 2017-03-12 NOTE — Consult Note (Signed)
Referring Physician: Scotty Court    Chief Complaint: Loss of vision  HPI: Alyssa Landry is an 83 y.o. female who was eating lunch with her daughter.  At the start of lunch patient as at her baseline.  Later during the meal the patient began to complain of right neck pain.  This is not unusual for her.  Her daughter then placed something in front of her mother for he to take and she could not find it.  Patient then reported that she was unable to see.  Patient was brought in for evaluation at that time.  Initial NIHSS of 2.    Date last known well: Date: 02/27/2017 Time last known well: Time: 13:00 tPA Given: No: Patient with a history of ICH  Past Medical History:  Diagnosis Date  . A-fib (HCC)   . Acid reflux   . Anemia   . Anxiety   . Arthritis   . Asthma   . Chronic hyponatremia   . COPD (chronic obstructive pulmonary disease) (HCC)   . GERD (gastroesophageal reflux disease)   . Hiatal hernia   . Hiatal hernia   . Hyperlipidemia   . Hypertension   . Hypothyroidism   . Incontinence   . Migraine   . Osteoporosis, post-menopausal   . Persistent cough   . Pulmonary fibrosis (HCC)   . SIADH (syndrome of inappropriate ADH production) (HCC)   . TIA (transient ischemic attack)     Past Surgical History:  Procedure Laterality Date  . ABDOMINAL HYSTERECTOMY  1966  . EYE SURGERY Bilateral    cataracts    Family History  Problem Relation Age of Onset  . Leukemia Mother   . Heart disease Father   . Hypertension Unknown    Social History:  reports that she has never smoked. She has never used smokeless tobacco. She reports that she does not drink alcohol or use drugs.  Allergies:  Allergies  Allergen Reactions  . Aspirin Other (See Comments)    Upset stomach with high doses  . Codeine Other (See Comments)    Reaction:  Unknown   . Ditropan [Oxybutynin] Other (See Comments)    Reaction:  Unknown   . Hctz [Hydrochlorothiazide] Other (See Comments)    Reaction:  Causes  pts sodium/potassium levels to drop significantly   . Metronidazole Other (See Comments)    Reaction:  Unknown   . Propranolol Other (See Comments)    Reaction:  Unknown   . Tavist [Clemastine] Other (See Comments)    Reaction:  Unknown   . Tramadol Other (See Comments)    Reaction:  Unknown   . Vicodin [Hydrocodone-Acetaminophen] Other (See Comments)    Reaction:  Unknown     Medications: I have reviewed the patient's current medications. Prior to Admission:  Prior to Admission medications   Medication Sig Start Date End Date Taking? Authorizing Provider  acetaminophen (TYLENOL) 650 MG CR tablet Take 650 mg by mouth every 8 (eight) hours as needed for pain.   Yes [provider]  albuterol (PROVENTIL HFA;VENTOLIN HFA) 108 (90 BASE) MCG/ACT inhaler Inhale 1-2 puffs into the lungs every 4 (four) hours as needed for wheezing or shortness of breath.   Yes [provider]  aspirin EC 81 MG EC tablet Take 1 tablet (81 mg total) by mouth daily. 09/06/16  Yes Milagros Loll, MD  atorvastatin (LIPITOR) 40 MG tablet Take 1 tablet (40 mg total) by mouth daily at 6 PM. 08/22/16  Yes Enid Baas, MD  cholecalciferol (VITAMIN D) 1000 UNITS tablet Take 1,000 Units by mouth daily.   Yes [provider]  cloNIDine (CATAPRES) 0.1 MG tablet Take 1 tablet (0.1 mg total) by mouth 2 (two) times daily. 01/30/17  Yes Enedina Finner, MD  diltiazem (CARDIZEM CD) 120 MG 24 hr capsule Take 1 capsule (120 mg total) by mouth daily. 12/15/15  Yes Hower, Cletis [place], MD  docusate sodium (COLACE) 100 MG capsule Take 100 mg by mouth 2 (two) times daily.    Yes [provider]  feeding supplement, ENSURE ENLIVE, (ENSURE ENLIVE) LIQD Take 237 mLs by mouth daily.    Yes [provider]  ferrous sulfate 325 (65 FE) MG tablet Take 325 mg by mouth daily.    Yes [provider]  fluticasone (FLONASE) 50 MCG/ACT nasal spray Place 2 sprays into both nostrils daily.   Yes [provider]  fluticasone (FLOVENT HFA) 110 MCG/ACT inhaler Inhale 2 puffs into the lungs 2 (two) times daily. 11/13/16 11/13/17 Yes [provider]  furosemide (LASIX) 20 MG tablet Take 1 tablet (20 mg total) by mouth every other day. 01/31/17  Yes Enedina Finner, MD  levothyroxine (SYNTHROID, LEVOTHROID) 125 MCG tablet Take 125 mcg by mouth daily before breakfast.   Yes [provider]  LORazepam (ATIVAN) 0.5 MG tablet Take 1 tablet (0.5 mg total) by mouth 3 (three) times daily. 10/12/16  Yes Enid Baas, MD  multivitamin-lutein [facility]) CAPS capsule Take 2 capsules by mouth daily.   Yes [provider]  Oxybutynin Chloride (GELNIQUE) 10 % GEL Place 1 application onto the skin daily. 01/27/17  Yes McGowan, Carollee Herter A, PA-C  polyethylene glycol (MIRALAX / GLYCOLAX) packet Take 8.5-17 g by mouth daily. *Mix in 4-8 ounces of fluid prior to taking*   Yes [provider]  ranitidine (ZANTAC) 150 MG tablet Take 150 mg by mouth 2 (two) times daily before a meal.    Yes [provider]  sodium bicarbonate 650 MG tablet Take 325 mg by mouth 4 (four) times daily.   Yes [provider]  tiotropium (SPIRIVA) 18 MCG inhalation capsule Place 18 mcg into inhaler and inhale daily.   Yes [provider]  vitamin B-12 (CYANOCOBALAMIN) 1000 MCG tablet Take 1,000 mcg by mouth daily.   Yes [provider]  budesonide (PULMICORT) 0.5 MG/2ML nebulizer solution Take 2 mLs (0.5 mg total) by nebulization 2 (two) times daily. Patient not taking: Reported on 01/28/2017 10/12/16   Enid Baas, MD  levofloxacin (LEVAQUIN) 250 MG tablet Take 1 tablet (250 mg total) by mouth daily. Patient not taking: Reported on 02/14/2017 01/30/17   Enedina Finner, MD  spironolactone (ALDACTONE) 25 MG tablet Take 25 mg by mouth daily.    [provider]  triamcinolone (KENALOG) 0.025 % ointment Apply 1 application topically 2 (two) times daily. Patient not  taking: Reported on 02/14/2017 09/22/16   Michiel Cowboy A, PA-C     ROS: History obtained from the patient  General ROS: negative for - chills, fatigue, fever, night sweats, weight gain or weight loss Psychological ROS: negative for - behavioral disorder, hallucinations, memory difficulties, mood swings or suicidal ideation Ophthalmic ROS: as noted in HPI ENT ROS: negative for - epistaxis, nasal discharge, oral lesions, sore throat, tinnitus or vertigo Allergy and Immunology ROS: negative for - hives or itchy/watery eyes Hematological and Lymphatic ROS: negative for - bleeding problems, bruising or swollen lymph nodes Endocrine ROS: negative for - galactorrhea, hair pattern changes, polydipsia/polyuria or temperature intolerance  Respiratory ROS: shortness of breath Cardiovascular ROS: negative for - chest pain, dyspnea on exertion, edema or irregular heartbeat Gastrointestinal ROS: negative for - abdominal pain, diarrhea, hematemesis, nausea/vomiting or stool incontinence Genito-Urinary ROS: negative for - dysuria, hematuria, incontinence or urinary frequency/urgency Musculoskeletal ROS: neck pain Neurological ROS: as noted in HPI Dermatological ROS: negative for rash and skin lesion changes  Physical Examination: Pulse 83, temperature 98.3 F (36.8 C), temperature source Oral, resp. rate (!) 21, height 4\' 10"  (1.473 m), weight 46.7 kg (103 lb), SpO2 100 %.  HEENT-  Normocephalic, no lesions, without obvious abnormality.  Normal external eye and conjunctiva.  Normal TM's bilaterally.  Normal auditory canals and external ears. Normal external nose, mucus membranes and septum.  Normal pharynx. Cardiovascular- S1, S2 normal, pulses palpable throughout   Lungs- chest clear, no wheezing, rales, normal symmetric air entry Abdomen- soft, non-tender; bowel sounds normal; no masses,  no organomegaly Extremities- no edema Lymph-no adenopathy palpable Musculoskeletal-no joint tenderness,  deformity or swelling Skin-multiple areas of bruising noted  Neurological Examination   Mental Status: Alert, oriented, thought content appropriate.  Speech fluent without evidence of aphasia.  Able to follow 3 step commands without difficulty. Cranial Nerves: II: Discs flat bilaterally; Some peripheral vision loss in the right eye.   Unable to see from the left eye, pupils equal, round, reactive to light and accommodation III,IV, VI: ptosis not present, extra-ocular motions intact bilaterally V,VII: smile symmetric, facial light touch sensation normal bilaterally VIII: hearing normal bilaterally IX,X: gag reflex present XI: bilateral shoulder shrug XII: midline tongue extension Motor: Right : Upper extremity   5/5    Left:     Upper extremity   5/5  Lower extremity   5-/5     Lower extremity   5-/5 Tone and bulk:normal tone throughout; no atrophy noted Sensory: Pinprick and light touch intact throughout, bilaterally Deep Tendon Reflexes: 2+ in the upper extremities, trace at the knees and absent AJ's bilaterally Right: downgoing   Left: upgoing  Cerebellar: Normal finger-to-nose and normal heel-to-shin testing bilaterally Gait: not tested due to safety concerns    Laboratory Studies:  Basic Metabolic Panel: No results for input(s): NA, K, CL, CO2, GLUCOSE, BUN, CREATININE, CALCIUM, MG, PHOS in the last 168 hours.  Liver Function Tests: No results for input(s): AST, ALT, ALKPHOS, BILITOT, PROT, ALBUMIN in the last 168 hours. No results for input(s): LIPASE, AMYLASE in the last 168 hours. No results for input(s): AMMONIA in the last 168 hours.  CBC: No results for input(s): WBC, NEUTROABS, HGB, HCT, MCV, PLT in the last 168 hours.  Cardiac Enzymes: No results for input(s): CKTOTAL, CKMB, CKMBINDEX, TROPONINI in the last 168 hours.  BNP: Invalid input(s): POCBNP  CBG: No results for input(s): GLUCAP in the last 168 hours.  Microbiology: Results for orders placed or  performed during the hospital encounter of 01/28/17  MRSA PCR Screening     Status: Abnormal   Collection Time: 01/28/17  5:54 PM  Result Value Ref Range Status   MRSA by PCR POSITIVE (A) NEGATIVE Final    Comment:        The GeneXpert MRSA Assay (FDA approved for NASAL specimens only), is one component of a comprehensive MRSA colonization surveillance program. It is not intended to diagnose MRSA infection nor to guide or monitor treatment for MRSA infections. RESULT CALLED TO, READ BACK BY AND VERIFIED WITH: DRUCILLA JACKSON 01/28/17 1953 KLW     Coagulation Studies: No results for input(s): LABPROT, INR in  the last 72 hours.  Urinalysis: No results for input(s): COLORURINE, LABSPEC, PHURINE, GLUCOSEU, HGBUR, BILIRUBINUR, KETONESUR, PROTEINUR, UROBILINOGEN, NITRITE, LEUKOCYTESUR in the last 168 hours.  Invalid input(s): APPERANCEUR  Lipid Panel:    Component Value Date/Time   CHOL 95 09/05/2016 0434   CHOL 220 (H) 08/10/2011 0619   TRIG 16 09/05/2016 0434   TRIG 73 08/10/2011 0619   HDL 58 09/05/2016 0434   HDL 106 (H) 08/10/2011 0619   CHOLHDL 1.6 09/05/2016 0434   VLDL 3 09/05/2016 0434   VLDL 15 08/10/2011 0619   LDLCALC 34 09/05/2016 0434   LDLCALC 99 08/10/2011 0619    HgbA1C:  Lab Results  Component Value Date   HGBA1C 5.3 09/05/2016    Urine Drug Screen:  No results found for: LABOPIA, COCAINSCRNUR, LABBENZ, AMPHETMU, THCU, LABBARB  Alcohol Level: No results for input(s): ETH in the last 168 hours.   Imaging: Ct Head Code Stroke Wo Contrast`  Result Date: 23-Mar-2017 CLINICAL DATA:  Code stroke.  Vision loss and neck pain. EXAM: CT HEAD WITHOUT CONTRAST TECHNIQUE: Contiguous axial images were obtained from the base of the skull through the vertex without intravenous contrast. COMPARISON:  02/14/2017 FINDINGS: Brain: Moderate remote right frontal infarct affecting the operculum and anterior insula. Remote moderate left parasagittal occipital infarct.  Patchy chronic microvascular ischemic gliosis in the cerebral white matter. Age congruent generalized cerebral volume loss. No hemorrhage, hydrocephalus or masslike finding. Vascular: Atherosclerotic calcification.  No hyperdense vessel. Skull: Negative. Sinuses/Orbits: Incomplete coverage of the orbits without acute finding. Bilateral cataract resection. Other: These results were called by telephone at the time of interpretation on 03/23/2017 at 2:37 pm to Dr. Scotty Court , who verbally acknowledged these results. ASPECTS [facility] Stroke Program Early CT Score) Not scored with this symptomatology IMPRESSION: 1. No acute finding or change from prior. 2. 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 Electronically Signed   By: Marnee Spring M.D.   On: March 23, 2017 14:42    Assessment: 83 y.o. female presenting with loss of vision from the left eye.  Suspect embolic ischemic event.  Head CT reviewed and shows no acute changes.  Chronic infarcts noted.  Patient on ASA at home.  Not an anticoagulation candidate despite atrial fibrillation due to history of hemorrhage.  Recent work up in February was unremarkable.      Stroke Risk Factors - atrial fibrillation, hyperlipidemia and hypertension  Plan: 1. HgbA1c, fasting lipid panel 2. MRI of the brain without contrast 3. PT consult, OT consult, Speech consult 4. Echocardiogram 5. Prophylactic therapy-Continue ASA 6. NPO until RN stroke swallow screen 7. Telemetry monitoring 9. Frequent neuro checks  Case discussed with Dr. Claudius Sis, MD Neurology [phone] 03/23/2017, 3:09 PM

## 2017-03-12 NOTE — H&P (Signed)
[facility] - [facility] Admission History and Physical Tonye Royalty, D.O.  ---------------------------------------------------------------------------------------------------------------------   PATIENT NAME: Alyssa Landry MR#: [id number] DATE OF BIRTH: 18-Mar-1919 DATE OF ADMISSION: 03/02/2017 PRIMARY CARE PHYSICIAN: Rafael Bihari, MD  REQUESTING/REFERRING PHYSICIAN: ED Dr. Scotty Court  CHIEF COMPLAINT: Chief Complaint  Patient presents with  . Code Stroke  Please note the entire history is obtained from the patient's emergency department chart, emergency department provider and the patient's family who is at the bedside. Patient's personal history is limited by advanced age, poor historian.   HISTORY OF PRESENT ILLNESS: Alyssa Landry is a 83 y.o. female with a known history of afib not on AC, COPD, HERD, HLD, HTN hypothyroidism, pulmonary fibrosis, SIADH, TIA was in a usual state of health until 12:30 PM when she was eating lunch with her daughter and she experienced sudden loss of vision in the left eye. Patient and daughter indicate that vision is slowly improving as the day goes on.  Patient has right eye visual field deficit from prior stroke.   Patient has had a prior hemorrhagic stroke which precludes AC for afib.   Otherwise there has been no change in status. Patient has been taking medication as prescribed and there has been no recent change in medication or diet.  There has been no recent illness, travel or sick contacts.    Patient denies fevers/chills, weakness, dizziness, chest pain, shortness of breath, N/V/C/D, abdominal pain, dysuria/frequency, changes in mental status.   EMS/ED COURSE:  Patient rec'd aspirin and Protonix. MRI in ED revealed a 1cm acute ischemic infarct of the right temporal lobe/posterior rigth hippocampus, as well as a late subacute to chronic infarct in the R PCA territory. Medical admission was requested for further workup and  management of acute CVA.   PAST MEDICAL HISTORY: Past Medical History:  Diagnosis Date  . A-fib (HCC)   . Acid reflux   . Anemia   . Anxiety   . Arthritis   . Asthma   . Chronic hyponatremia   . COPD (chronic obstructive pulmonary disease) (HCC)   . GERD (gastroesophageal reflux disease)   . Hiatal hernia   . Hiatal hernia   . Hyperlipidemia   . Hypertension   . Hypothyroidism   . Incontinence   . Migraine   . Osteoporosis, post-menopausal   . Persistent cough   . Pulmonary fibrosis (HCC)   . SIADH (syndrome of inappropriate ADH production) (HCC)   . TIA (transient ischemic attack)       PAST SURGICAL HISTORY: Past Surgical History:  Procedure Laterality Date  . ABDOMINAL HYSTERECTOMY  1966  . EYE SURGERY Bilateral    cataracts      SOCIAL HISTORY: Social History  Substance Use Topics  . Smoking status: Never Smoker  . Smokeless tobacco: Never Used  . Alcohol use No      FAMILY HISTORY: Family History  Problem Relation Age of Onset  . Leukemia Mother   . Heart disease Father   . Hypertension Unknown      MEDICATIONS AT HOME: Prior to Admission medications   Medication Sig Start Date End Date Taking? Authorizing Provider  acetaminophen (TYLENOL) 650 MG CR tablet Take 650 mg by mouth every 8 (eight) hours as needed for pain.   Yes [provider]  albuterol (PROVENTIL HFA;VENTOLIN HFA) 108 (90 BASE) MCG/ACT inhaler Inhale 1-2 puffs into the lungs every 4 (four) hours as needed for wheezing or shortness of breath.   Yes [provider]  aspirin EC 81 MG EC tablet Take 1 tablet (81 mg total) by mouth daily. 09/06/16  Yes Milagros LollSudini, Srikar, MD  atorvastatin (LIPITOR) 40 MG tablet Take 1 tablet (40 mg total) by mouth daily at 6 PM. 08/22/16  Yes Enid BaasKalisetti, Radhika, MD  cholecalciferol (VITAMIN D) 1000 UNITS tablet Take 1,000 Units by mouth daily.   Yes [provider]  cloNIDine (CATAPRES) 0.1 MG tablet Take 1 tablet (0.1 mg total) by  mouth 2 (two) times daily. 01/30/17  Yes Enedina FinnerPatel, Sona, MD  diltiazem (CARDIZEM CD) 120 MG 24 hr capsule Take 1 capsule (120 mg total) by mouth daily. 12/15/15  Yes Hower, Cletis Athensavid K, MD  docusate sodium (COLACE) 100 MG capsule Take 100 mg by mouth 2 (two) times daily.    Yes [provider]  feeding supplement, ENSURE ENLIVE, (ENSURE ENLIVE) LIQD Take 237 mLs by mouth daily.    Yes [provider]  ferrous sulfate 325 (65 FE) MG tablet Take 325 mg by mouth daily.    Yes [provider]  fluticasone (FLONASE) 50 MCG/ACT nasal spray Place 2 sprays into both nostrils daily.   Yes [provider]  fluticasone (FLOVENT HFA) 110 MCG/ACT inhaler Inhale 2 puffs into the lungs 2 (two) times daily. 11/13/16 11/13/17 Yes [provider]  furosemide (LASIX) 20 MG tablet Take 1 tablet (20 mg total) by mouth every other day. 01/31/17  Yes Enedina FinnerPatel, Sona, MD  levothyroxine (SYNTHROID, LEVOTHROID) 125 MCG tablet Take 125 mcg by mouth daily before breakfast.   Yes [provider]  LORazepam (ATIVAN) 0.5 MG tablet Take 1 tablet (0.5 mg total) by mouth 3 (three) times daily. 10/12/16  Yes Enid BaasKalisetti, Radhika, MD  multivitamin-lutein [facility](OCUVITE-LUTEIN) CAPS capsule Take 2 capsules by mouth daily.   Yes [provider]  Oxybutynin Chloride (GELNIQUE) 10 % GEL Place 1 application onto the skin daily. 01/27/17  Yes McGowan, Carollee HerterShannon A, PA-C  polyethylene glycol (MIRALAX / GLYCOLAX) packet Take 8.5-17 g by mouth daily. *Mix in 4-8 ounces of fluid prior to taking*   Yes [provider]  ranitidine (ZANTAC) 150 MG tablet Take 150 mg by mouth 2 (two) times daily before a meal.    Yes [provider]  sodium bicarbonate 650 MG tablet Take 325 mg by mouth 4 (four) times daily.   Yes [provider]  tiotropium (SPIRIVA) 18 MCG inhalation capsule Place 18 mcg into inhaler and inhale daily.   Yes [provider]  vitamin B-12 (CYANOCOBALAMIN) 1000 MCG  tablet Take 1,000 mcg by mouth daily.   Yes [provider]  budesonide (PULMICORT) 0.5 MG/2ML nebulizer solution Take 2 mLs (0.5 mg total) by nebulization 2 (two) times daily. Patient not taking: Reported on 01/28/2017 10/12/16   Enid BaasKalisetti, Radhika, MD  levofloxacin (LEVAQUIN) 250 MG tablet Take 1 tablet (250 mg total) by mouth daily. Patient not taking: Reported on 02/14/2017 01/30/17   Enedina FinnerPatel, Sona, MD  spironolactone (ALDACTONE) 25 MG tablet Take 25 mg by mouth daily.    [provider]  triamcinolone (KENALOG) 0.025 % ointment Apply 1 application topically 2 (two) times daily. Patient not taking: Reported on 02/14/2017 09/22/16   Michiel CowboyMcGowan, Shannon A, PA-C      DRUG ALLERGIES: Allergies  Allergen Reactions  . Aspirin Other (See Comments)    Upset stomach with high doses  . Codeine Other (See Comments)    Reaction:  Unknown   . Ditropan [Oxybutynin] Other (See Comments)    Reaction:  Unknown   .  Hctz [Hydrochlorothiazide] Other (See Comments)    Reaction:  Causes pts sodium/potassium levels to drop significantly   . Metronidazole Other (See Comments)    Reaction:  Unknown   . Propranolol Other (See Comments)    Reaction:  Unknown   . Tavist [Clemastine] Other (See Comments)    Reaction:  Unknown   . Tramadol Other (See Comments)    Reaction:  Unknown   . Vicodin [Hydrocodone-Acetaminophen] Other (See Comments)    Reaction:  Unknown      REVIEW OF SYSTEMS: CONSTITUTIONAL: No fever/chills, fatigue, weakness, weight gain/loss, headache EYES: No blurry or double vision. Left sided vision loss per HPI ENT: No tinnitus, postnasal drip, redness or soreness of the oropharynx. RESPIRATORY: No cough, wheeze, hemoptysis, dyspnea. CARDIOVASCULAR: No chest pain, orthopnea, palpitations, syncope. GASTROINTESTINAL: No nausea, vomiting, constipation, diarrhea, abdominal pain, hematemesis, melena or hematochezia. GENITOURINARY: No dysuria or hematuria. ENDOCRINE: No polyuria or  nocturia. No heat or cold intolerance. HEMATOLOGY: No anemia, bruising, bleeding. INTEGUMENTARY: No rashes, ulcers, lesions. MUSCULOSKELETAL: No arthritis, swelling, gout. NEUROLOGIC: Positive left eye vision loss as per HPI.  No numbness, tingling, weakness or ataxia. No seizure-type activity. PSYCHIATRIC: No anxiety, depression, insomnia.  PHYSICAL EXAMINATION: VITAL SIGNS: Pulse 83, temperature 98.3 F (36.8 C), temperature source Oral, resp. rate (!) 21, height 4\' 10"  (1.473 m), weight 46.7 kg (103 lb), SpO2 100 %.  GENERAL: 82 y.o.-year-old female patient, lying in the bed in no acute distress. Pleasant and cooperative.  HEENT: Head atraumatic, normocephalic. Pupils equal, round, reactive to light and accommodation. No scleral icterus. Extraocular muscles intact. Nares are patent. Oropharynx is clear. Mucus membranes moist. NECK: Supple, full range of motion. No JVD, no bruit heard. No thyroid enlargement, no tenderness, no cervical lymphadenopathy. CHEST: Normal breath sounds bilaterally. No wheezing, rales, rhonchi or crackles. No use of accessory muscles of respiration.  No reproducible chest wall tenderness.  CARDIOVASCULAR: Irregular. S1, S2 normal. No murmurs, rubs, or gallops. Cap refill <2 seconds. ABDOMEN: Soft, nontender, nondistended. No rebound, guarding, rigidity. Normoactive bowel sounds present in all four quadrants. No organomegaly or mass. EXTREMITIES: No pedal edema, cyanosis, or clubbing. NEUROLOGIC: Visual field loss in the left eye.  Cranial nerves III through XII are grossly intact with no focal sensorimotor deficit. Muscle strength 5/5 in all extremities. Sensation intact. Gait not checked. PSYCHIATRIC: The patient is alert and oriented x 3. Normal affect, mood, thought content. SKIN: Warm, dry, and intact without obvious rash, lesion, or ulcer.  LABORATORY PANEL:  CBC No results for input(s): WBC, HGB, HCT, PLT in the last 168  hours. ----------------------------------------------------------------------------------------------------------------- Chemistries No results for input(s): NA, K, CL, CO2, GLUCOSE, BUN, CREATININE, CALCIUM, MG, AST, ALT, ALKPHOS, BILITOT in the last 168 hours.  Invalid input(s): GFRCGP ------------------------------------------------------------------------------------------------------------------ Cardiac Enzymes No results for input(s): TROPONINI in the last 168 hours. ------------------------------------------------------------------------------------------------------------------  RADIOLOGY: Mr Angiogram Head Wo Contrast  Result Date: 04-11-17 CLINICAL DATA:  Initial evaluation for acute visual field loss in left eye. History of prior stroke. EXAM: MRI HEAD WITHOUT CONTRAST MRA HEAD WITHOUT CONTRAST MRA NECK WITHOUT CONTRAST TECHNIQUE: Multiplanar, multiecho pulse sequences of the brain and surrounding structures were obtained without intravenous contrast. Angiographic images of the Circle of Willis were obtained using MRA technique without intravenous contrast. Angiographic images of the neck were obtained using MRA technique without intravenous contrast. Carotid stenosis measurements (when applicable) are obtained utilizing NASCET criteria, using the distal internal carotid diameter as the denominator. CONTRAST:  None. COMPARISON:  Prior CT from earlier the same day  as well as previous MRI from 09/05/2016. FINDINGS: MRI HEAD FINDINGS Advance generalized age-related cerebral atrophy. Patchy and confluent T2/FLAIR hyperintensity within the periventricular and deep white matter both cerebral hemispheres, most consistent with chronic microvascular ischemic disease, also fairly advanced in nature. Overall, changes are similar to previous. Normal expected interval evolution of chronic hemorrhagic left PCA territory infarct. There is a chronic anterior right MCA territory infarct involving the right  frontal operculum (series 7, image 16). While this is chronic in appearance, this is new relative to previous MRI from 09/05/2016. Scattered associated chronic blood products an laminar necrosis about this area of chronic infarction. Approximate 1 cm focus of restricted diffusion within the posterior aspect of the mesial right temporal lobe, posterior right hippocampal formation (series 100, image 26), consistent with acute ischemic infarct. No associated mass effect or hemorrhage. Linear susceptibility artifact with mild diffusion abnormality within the adjacent mesial right occipital lobe, suspected to reflect sequelae of late subacute to chronic ischemia (series 100, image 28). This is also new from previous. No other evidence for acute or subacute ischemia. Gray-white matter differentiation otherwise maintained. No evidence for acute intracranial hemorrhage. No mass lesion, midline shift or mass effect. Mild ventricular prominence related to global parenchymal volume loss without hydrocephalus. No extra-axial fluid collection. Major dural sinuses grossly patent. Pituitary gland within normal limits. Major intracranial vascular flow voids maintained. Degenerative thickening of the tectorial membrane noted. Craniocervical junction otherwise unremarkable without significant stenosis. Visualized upper cervical spine unremarkable. Bone marrow signal intensity within normal limits. No scalp soft tissue abnormality. Globes and orbital soft tissues within normal limits. Patient status post lens extraction bilaterally. Paranasal sinuses are clear. No mastoid effusion. Inner ear structures normal. MRA HEAD FINDINGS ANTERIOR CIRCULATION: Distal cervical segments of the internal carotid arteries are patent with antegrade flow. Petrous segments patent without flow-limiting stenosis. Multifocal atheromatous irregularity within the cavernous ICAs without high-grade stenosis. Supraclinoid segments patent. ICA termini widely  patent. A1 segments patent bilaterally. Anterior communicating artery normal. Anterior cerebral artery is widely patent to their distal aspects without flow-limiting stenosis. M1 segments demonstrate multifocal atheromatous irregularity but are widely patent without flow-limiting stenosis or occlusion. MCA bifurcations normal. No proximal M2 occlusion. Distal MCA branches well opacified and symmetric. POSTERIOR CIRCULATION: Vertebral artery's patent to the vertebrobasilar junction without flow-limiting stenosis. Posterior inferior cerebral artery is partially visualized but patent bilaterally. Basilar artery widely patent to its distal aspect without stenosis. Superior cerebral arteries patent bilaterally. Both of the posterior cerebral artery is supplied via the basilar artery. PCAs patent to their distal aspects without proximal stenosis or occlusion. No aneurysm or vascular malformation. MRA NECK FINDINGS Examination limited due to lack of IV contrast. Aortic arch and origin of great vessels not evaluated on this exam. Visualize right common and internal carotid artery's patent within the neck without obvious flow-limiting stenosis no significant atheromatous narrowing about the right carotid bifurcation. Please note that the proximal right common carotid artery not well evaluated. Visualized left common and internal carotid artery's patent within the neck without obvious flow-limiting stenosis. No significant atheromatous narrowing about the left carotid bifurcation. Please note that the proximal left common carotid artery not well evaluated. Proximal/origin of the vertebral arteries not evaluated. Right vertebral artery patent with antegrade flow without significant stenosis. Focal irregularity with moderate to severe stenosis present within the mid left V2 segment (series 111, image 10). Left vertebral artery otherwise patent to the skullbase. IMPRESSION: MRI HEAD IMPRESSION: 1. 1 cm acute ischemic  nonhemorrhagic infarct involving  the mesial right temporal lobe/posterior right hippocampal formation. No associated mass effect. 2. Additional probable late subacute to chronic infarct within the adjacent right occipital lobe (right PCA territory), not acute in appearance, but new relative to most recent MRI. 3. 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 4. Normal expected interval evolution of chronic hemorrhagic left PCA territory infarct. 5. Generalized age-related cerebral atrophy with advanced chronic microvascular ischemic disease. MRA HEAD IMPRESSION: Negative intracranial MRA. No large vessel occlusion. No high-grade or correctable stenosis. MRA NECK IMPRESSION: 1. Limited study due to lack of IV contrast. 2. Patent carotid artery systems bilaterally without obvious flow-limiting or critical stenosis. 3. Short-segment moderate to severe mid left V2 stenosis. Vertebral arteries otherwise widely patent within the neck. Electronically Signed   By: Rise Mu M.D.   On: March 14, 2017 18:58   Mr Maxine Glenn Neck Wo Contrast  Result Date: Mar 14, 2017 CLINICAL DATA:  Initial evaluation for acute visual field loss in left eye. History of prior stroke. EXAM: MRI HEAD WITHOUT CONTRAST MRA HEAD WITHOUT CONTRAST MRA NECK WITHOUT CONTRAST TECHNIQUE: Multiplanar, multiecho pulse sequences of the brain and surrounding structures were obtained without intravenous contrast. Angiographic images of the Circle of Willis were obtained using MRA technique without intravenous contrast. Angiographic images of the neck were obtained using MRA technique without intravenous contrast. Carotid stenosis measurements (when applicable) are obtained utilizing NASCET criteria, using the distal internal carotid diameter as the denominator. CONTRAST:  None. COMPARISON:  Prior CT from earlier the same day as well as previous MRI from 09/05/2016. FINDINGS: MRI HEAD FINDINGS  Advance generalized age-related cerebral atrophy. Patchy and confluent T2/FLAIR hyperintensity within the periventricular and deep white matter both cerebral hemispheres, most consistent with chronic microvascular ischemic disease, also fairly advanced in nature. Overall, changes are similar to previous. Normal expected interval evolution of chronic hemorrhagic left PCA territory infarct. There is a chronic anterior right MCA territory infarct involving the right frontal operculum (series 7, image 16). While this is chronic in appearance, this is new relative to previous MRI from 09/05/2016. Scattered associated chronic blood products an laminar necrosis about this area of chronic infarction. Approximate 1 cm focus of restricted diffusion within the posterior aspect of the mesial right temporal lobe, posterior right hippocampal formation (series 100, image 26), consistent with acute ischemic infarct. No associated mass effect or hemorrhage. Linear susceptibility artifact with mild diffusion abnormality within the adjacent mesial right occipital lobe, suspected to reflect sequelae of late subacute to chronic ischemia (series 100, image 28). This is also new from previous. No other evidence for acute or subacute ischemia. Gray-white matter differentiation otherwise maintained. No evidence for acute intracranial hemorrhage. No mass lesion, midline shift or mass effect. Mild ventricular prominence related to global parenchymal volume loss without hydrocephalus. No extra-axial fluid collection. Major dural sinuses grossly patent. Pituitary gland within normal limits. Major intracranial vascular flow voids maintained. Degenerative thickening of the tectorial membrane noted. Craniocervical junction otherwise unremarkable without significant stenosis. Visualized upper cervical spine unremarkable. Bone marrow signal intensity within normal limits. No scalp soft tissue abnormality. Globes and orbital soft tissues within  normal limits. Patient status post lens extraction bilaterally. Paranasal sinuses are clear. No mastoid effusion. Inner ear structures normal. MRA HEAD FINDINGS ANTERIOR CIRCULATION: Distal cervical segments of the internal carotid arteries are patent with antegrade flow. Petrous segments patent without flow-limiting stenosis. Multifocal atheromatous irregularity within the cavernous ICAs without high-grade stenosis. Supraclinoid segments patent. ICA termini widely patent.  A1 segments patent bilaterally. Anterior communicating artery normal. Anterior cerebral artery is widely patent to their distal aspects without flow-limiting stenosis. M1 segments demonstrate multifocal atheromatous irregularity but are widely patent without flow-limiting stenosis or occlusion. MCA bifurcations normal. No proximal M2 occlusion. Distal MCA branches well opacified and symmetric. POSTERIOR CIRCULATION: Vertebral artery's patent to the vertebrobasilar junction without flow-limiting stenosis. Posterior inferior cerebral artery is partially visualized but patent bilaterally. Basilar artery widely patent to its distal aspect without stenosis. Superior cerebral arteries patent bilaterally. Both of the posterior cerebral artery is supplied via the basilar artery. PCAs patent to their distal aspects without proximal stenosis or occlusion. No aneurysm or vascular malformation. MRA NECK FINDINGS Examination limited due to lack of IV contrast. Aortic arch and origin of great vessels not evaluated on this exam. Visualize right common and internal carotid artery's patent within the neck without obvious flow-limiting stenosis no significant atheromatous narrowing about the right carotid bifurcation. Please note that the proximal right common carotid artery not well evaluated. Visualized left common and internal carotid artery's patent within the neck without obvious flow-limiting stenosis. No significant atheromatous narrowing about the left  carotid bifurcation. Please note that the proximal left common carotid artery not well evaluated. Proximal/origin of the vertebral arteries not evaluated. Right vertebral artery patent with antegrade flow without significant stenosis. Focal irregularity with moderate to severe stenosis present within the mid left V2 segment (series 111, image 10). Left vertebral artery otherwise patent to the skullbase. IMPRESSION: MRI HEAD IMPRESSION: 1. 1 cm acute ischemic nonhemorrhagic infarct involving the mesial right temporal lobe/posterior right hippocampal formation. No associated mass effect. 2. Additional probable late subacute to chronic infarct within the adjacent right occipital lobe (right PCA territory), not acute in appearance, but new relative to most recent MRI. 3. 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 4. Normal expected interval evolution of chronic hemorrhagic left PCA territory infarct. 5. Generalized age-related cerebral atrophy with advanced chronic microvascular ischemic disease. MRA HEAD IMPRESSION: Negative intracranial MRA. No large vessel occlusion. No high-grade or correctable stenosis. MRA NECK IMPRESSION: 1. Limited study due to lack of IV contrast. 2. Patent carotid artery systems bilaterally without obvious flow-limiting or critical stenosis. 3. Short-segment moderate to severe mid left V2 stenosis. Vertebral arteries otherwise widely patent within the neck. Electronically Signed   By: Rise Mu M.D.   On: 03/02/2017 18:58   Mr Brain Wo Contrast  Result Date: 03/09/2017 CLINICAL DATA:  Initial evaluation for acute visual field loss in left eye. History of prior stroke. EXAM: MRI HEAD WITHOUT CONTRAST MRA HEAD WITHOUT CONTRAST MRA NECK WITHOUT CONTRAST TECHNIQUE: Multiplanar, multiecho pulse sequences of the brain and surrounding structures were obtained without intravenous contrast. Angiographic images of the  Circle of Willis were obtained using MRA technique without intravenous contrast. Angiographic images of the neck were obtained using MRA technique without intravenous contrast. Carotid stenosis measurements (when applicable) are obtained utilizing NASCET criteria, using the distal internal carotid diameter as the denominator. CONTRAST:  None. COMPARISON:  Prior CT from earlier the same day as well as previous MRI from 09/05/2016. FINDINGS: MRI HEAD FINDINGS Advance generalized age-related cerebral atrophy. Patchy and confluent T2/FLAIR hyperintensity within the periventricular and deep white matter both cerebral hemispheres, most consistent with chronic microvascular ischemic disease, also fairly advanced in nature. Overall, changes are similar to previous. Normal expected interval evolution of chronic hemorrhagic left PCA territory infarct. There is a chronic anterior right MCA territory  infarct involving the right frontal operculum (series 7, image 16). While this is chronic in appearance, this is new relative to previous MRI from 09/05/2016. Scattered associated chronic blood products an laminar necrosis about this area of chronic infarction. Approximate 1 cm focus of restricted diffusion within the posterior aspect of the mesial right temporal lobe, posterior right hippocampal formation (series 100, image 26), consistent with acute ischemic infarct. No associated mass effect or hemorrhage. Linear susceptibility artifact with mild diffusion abnormality within the adjacent mesial right occipital lobe, suspected to reflect sequelae of late subacute to chronic ischemia (series 100, image 28). This is also new from previous. No other evidence for acute or subacute ischemia. Gray-white matter differentiation otherwise maintained. No evidence for acute intracranial hemorrhage. No mass lesion, midline shift or mass effect. Mild ventricular prominence related to global parenchymal volume loss without hydrocephalus. No  extra-axial fluid collection. Major dural sinuses grossly patent. Pituitary gland within normal limits. Major intracranial vascular flow voids maintained. Degenerative thickening of the tectorial membrane noted. Craniocervical junction otherwise unremarkable without significant stenosis. Visualized upper cervical spine unremarkable. Bone marrow signal intensity within normal limits. No scalp soft tissue abnormality. Globes and orbital soft tissues within normal limits. Patient status post lens extraction bilaterally. Paranasal sinuses are clear. No mastoid effusion. Inner ear structures normal. MRA HEAD FINDINGS ANTERIOR CIRCULATION: Distal cervical segments of the internal carotid arteries are patent with antegrade flow. Petrous segments patent without flow-limiting stenosis. Multifocal atheromatous irregularity within the cavernous ICAs without high-grade stenosis. Supraclinoid segments patent. ICA termini widely patent. A1 segments patent bilaterally. Anterior communicating artery normal. Anterior cerebral artery is widely patent to their distal aspects without flow-limiting stenosis. M1 segments demonstrate multifocal atheromatous irregularity but are widely patent without flow-limiting stenosis or occlusion. MCA bifurcations normal. No proximal M2 occlusion. Distal MCA branches well opacified and symmetric. POSTERIOR CIRCULATION: Vertebral artery's patent to the vertebrobasilar junction without flow-limiting stenosis. Posterior inferior cerebral artery is partially visualized but patent bilaterally. Basilar artery widely patent to its distal aspect without stenosis. Superior cerebral arteries patent bilaterally. Both of the posterior cerebral artery is supplied via the basilar artery. PCAs patent to their distal aspects without proximal stenosis or occlusion. No aneurysm or vascular malformation. MRA NECK FINDINGS Examination limited due to lack of IV contrast. Aortic arch and origin of great vessels not  evaluated on this exam. Visualize right common and internal carotid artery's patent within the neck without obvious flow-limiting stenosis no significant atheromatous narrowing about the right carotid bifurcation. Please note that the proximal right common carotid artery not well evaluated. Visualized left common and internal carotid artery's patent within the neck without obvious flow-limiting stenosis. No significant atheromatous narrowing about the left carotid bifurcation. Please note that the proximal left common carotid artery not well evaluated. Proximal/origin of the vertebral arteries not evaluated. Right vertebral artery patent with antegrade flow without significant stenosis. Focal irregularity with moderate to severe stenosis present within the mid left V2 segment (series 111, image 10). Left vertebral artery otherwise patent to the skullbase. IMPRESSION: MRI HEAD IMPRESSION: 1. 1 cm acute ischemic nonhemorrhagic infarct involving the mesial right temporal lobe/posterior right hippocampal formation. No associated mass effect. 2. Additional probable late subacute to chronic infarct within the adjacent right occipital lobe (right PCA territory), not acute in appearance, but new relative to most recent MRI. 3. 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 4. Normal expected interval evolution of chronic hemorrhagic left PCA territory infarct.  5. Generalized age-related cerebral atrophy with advanced chronic microvascular ischemic disease. MRA HEAD IMPRESSION: Negative intracranial MRA. No large vessel occlusion. No high-grade or correctable stenosis. MRA NECK IMPRESSION: 1. Limited study due to lack of IV contrast. 2. Patent carotid artery systems bilaterally without obvious flow-limiting or critical stenosis. 3. Short-segment moderate to severe mid left V2 stenosis. Vertebral arteries otherwise widely patent within the neck. Electronically  Signed   By: Rise Mu M.D.   On: 02/15/2017 18:58   Ct Head Code Stroke Wo Contrast`  Result Date: 03/04/2017 CLINICAL DATA:  Code stroke.  Vision loss and neck pain. EXAM: CT HEAD WITHOUT CONTRAST TECHNIQUE: Contiguous axial images were obtained from the base of the skull through the vertex without intravenous contrast. COMPARISON:  02/14/2017 FINDINGS: Brain: Moderate remote right frontal infarct affecting the operculum and anterior insula. Remote moderate left parasagittal occipital infarct. Patchy chronic microvascular ischemic gliosis in the cerebral white matter. Age congruent generalized cerebral volume loss. No hemorrhage, hydrocephalus or masslike finding. Vascular: Atherosclerotic calcification.  No hyperdense vessel. Skull: Negative. Sinuses/Orbits: Incomplete coverage of the orbits without acute finding. Bilateral cataract resection. Other: These results were called by telephone at the time of interpretation on 02/16/2017 at 2:37 pm to Dr. Scotty Court , who verbally acknowledged these results. ASPECTS [facility] Stroke Program Early CT Score) Not scored with this symptomatology IMPRESSION: 1. No acute finding or change from prior. 2. 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 Electronically Signed   By: Marnee Spring M.D.   On: 03/05/2017 14:42    EKG:  Atrial fibrillation at 70bpm, LAD, Incomplete RBBB, nonspecific ST-T wave changes.    IMPRESSION AND PLAN:  This is a 83 y.o. female with a history of afib not on AC, COPD, GERD, HLD, HTN hypothyroidism, pulmonary fibrosis, SIADH, TIA now being admitted with:  1. Acute CVA -  - Admit telemetry observation for neuro workup including: - Studies: Echo, Carotids - Labs: CBC, BMP, Lipids, TFTs, A1C - Nursing: Neurochecks, O2, dysphagia screen, permissive hypertension.  - Consults: Neurology, PT/OT, S/S consults.  - Meds: Daily aspirin 81mg . Lipitor - Fluids: IVNS@75cc /hr.   - Routine DVT Px: with  Lovenox, SCDs, early ambulation - Hold ativan  2. History of HTN - Hold  Lasix, spironolactone for now.  3. History of COPD Continue Flovent, Spiriva  4. History of HLD Continue Lipitor  5. History of hypothyroidism Continue Synthroid  6. History of afib Hold Cardizem for now  7. History of GERD Continue Protonix (for Pepcid)   Code Status: DNR  All the records are reviewed and case discussed with ED provider. Management plans discussed with the patient and/or family who express understanding and agree with plan of care.   TOTAL TIME TAKING CARE OF THIS PATIENT: 45  minutes.   Alyssa Landry D.O. on 02/11/2017 at 8:39 PM Between 7am to 6pm - Pager - [phone] After 6pm go to [email or web address] - Social research officer, government [employer] [facility] [place] [facility] [phone] CC: Primary care physician; Rafael Bihari, MD     Note: This dictation was prepared with Dragon dictation along with smaller phrase technology. Any transcriptional errors that result from this process are unintentional.

## 2017-03-12 NOTE — ED Notes (Signed)
Patient transported to MRI 

## 2017-03-12 NOTE — ED Notes (Signed)
Patient to room.  Dr. Thad Rangereynolds at bedside.

## 2017-03-12 NOTE — ED Provider Notes (Signed)
[facility] Provider Note  ____________________________________________  Time seen: Approximately 3:27 PM  I have reviewed the triage vital signs and the nursing notes.   HISTORY  Chief Complaint Code Stroke  Level 5 Caveat: Portions of the History and Physical are unable to be obtained due to patient being a poor historian   HPI Alyssa Landry is a 83 y.o. female brought to the ED due to loss of vision at 12:30 PM today. Patient has a history of multiple strokes and TIAs in the past.  Denies headache or weakness. Denies trau Trauma. Compliant with medications. No other acute complaints.        Past Medical History:  Diagnosis Date  . A-fib (HCC)   . Acid reflux   . Anemia   . Anxiety   . Arthritis   . Asthma   . Chronic hyponatremia   . COPD (chronic obstructive pulmonary disease) (HCC)   . GERD (gastroesophageal reflux disease)   . Hiatal hernia   . Hiatal hernia   . Hyperlipidemia   . Hypertension   . Hypothyroidism   . Incontinence   . Migraine   . Osteoporosis, post-menopausal   . Persistent cough   . Pulmonary fibrosis (HCC)   . SIADH (syndrome of inappropriate ADH production) (HCC)   . TIA (transient ischemic attack)      Patient Active Problem List   Diagnosis Date Noted  . Sepsis (HCC) 01/28/2017  . HCAP (healthcare-associated pneumonia) 10/08/2016  . TIA (transient ischemic attack) 09/04/2016  . Stroke (HCC) 08/20/2016  . CHF (congestive heart failure) (HCC) 05/01/2016  . Chronic diastolic heart failure (HCC) 04/16/2016  . HTN (hypertension) 04/16/2016  . Hypoxia   . SOB (shortness of breath)   . Collapse of left lung   . Pleural effusion   . Centrilobular emphysema (HCC)   . Hyponatremia   . Pressure ulcer 02/19/2016  . Atrial fibrillation with RVR (HCC) 12/11/2015  . Urge incontinence 09/26/2015  . Lichenified rash 09/26/2015  . Syncopal episodes 09/25/2015     Past Surgical History:   Procedure Laterality Date  . ABDOMINAL HYSTERECTOMY  1966  . EYE SURGERY Bilateral    cataracts     Prior to Admission medications   Medication Sig Start Date End Date Taking? Authorizing Provider  acetaminophen (TYLENOL) 650 MG CR tablet Take 650 mg by mouth every 8 (eight) hours as needed for pain.   Yes [provider]  albuterol (PROVENTIL HFA;VENTOLIN HFA) 108 (90 BASE) MCG/ACT inhaler Inhale 1-2 puffs into the lungs every 4 (four) hours as needed for wheezing or shortness of breath.   Yes [provider]  aspirin EC 81 MG EC tablet Take 1 tablet (81 mg total) by mouth daily. 09/06/16  Yes Milagros Loll, MD  atorvastatin (LIPITOR) 40 MG tablet Take 1 tablet (40 mg total) by mouth daily at 6 PM. 08/22/16  Yes Enid Baas, MD  cholecalciferol (VITAMIN D) 1000 UNITS tablet Take 1,000 Units by mouth daily.   Yes [provider]  cloNIDine (CATAPRES) 0.1 MG tablet Take 1 tablet (0.1 mg total) by mouth 2 (two) times daily. 01/30/17  Yes Enedina Finner, MD  diltiazem (CARDIZEM CD) 120 MG 24 hr capsule Take 1 capsule (120 mg total) by mouth daily. 12/15/15  Yes Hower, Cletis [place], MD  docusate sodium (COLACE) 100 MG capsule Take 100 mg by mouth 2 (two) times daily.    Yes [provider]  feeding supplement, ENSURE ENLIVE, (ENSURE ENLIVE)  LIQD Take 237 mLs by mouth daily.    Yes [provider]  ferrous sulfate 325 (65 FE) MG tablet Take 325 mg by mouth daily.    Yes [provider]  fluticasone (FLONASE) 50 MCG/ACT nasal spray Place 2 sprays into both nostrils daily.   Yes [provider]  fluticasone (FLOVENT HFA) 110 MCG/ACT inhaler Inhale 2 puffs into the lungs 2 (two) times daily. 11/13/16 11/13/17 Yes [provider]  furosemide (LASIX) 20 MG tablet Take 1 tablet (20 mg total) by mouth every other day. 01/31/17  Yes Enedina Finner, MD  levothyroxine (SYNTHROID, LEVOTHROID) 125 MCG tablet Take 125 mcg by mouth daily before  breakfast.   Yes [provider]  LORazepam (ATIVAN) 0.5 MG tablet Take 1 tablet (0.5 mg total) by mouth 3 (three) times daily. 10/12/16  Yes Enid Baas, MD  multivitamin-lutein [facility]) CAPS capsule Take 2 capsules by mouth daily.   Yes [provider]  Oxybutynin Chloride (GELNIQUE) 10 % GEL Place 1 application onto the skin daily. 01/27/17  Yes McGowan, Carollee Herter A, PA-C  polyethylene glycol (MIRALAX / GLYCOLAX) packet Take 8.5-17 g by mouth daily. *Mix in 4-8 ounces of fluid prior to taking*   Yes [provider]  ranitidine (ZANTAC) 150 MG tablet Take 150 mg by mouth 2 (two) times daily before a meal.    Yes [provider]  sodium bicarbonate 650 MG tablet Take 325 mg by mouth 4 (four) times daily.   Yes [provider]  tiotropium (SPIRIVA) 18 MCG inhalation capsule Place 18 mcg into inhaler and inhale daily.   Yes [provider]  vitamin B-12 (CYANOCOBALAMIN) 1000 MCG tablet Take 1,000 mcg by mouth daily.   Yes [provider]  budesonide (PULMICORT) 0.5 MG/2ML nebulizer solution Take 2 mLs (0.5 mg total) by nebulization 2 (two) times daily. Patient not taking: Reported on 01/28/2017 10/12/16   Enid Baas, MD  levofloxacin (LEVAQUIN) 250 MG tablet Take 1 tablet (250 mg total) by mouth daily. Patient not taking: Reported on 02/14/2017 01/30/17   Enedina Finner, MD  spironolactone (ALDACTONE) 25 MG tablet Take 25 mg by mouth daily.    [provider]  triamcinolone (KENALOG) 0.025 % ointment Apply 1 application topically 2 (two) times daily. Patient not taking: Reported on 02/14/2017 09/22/16   Michiel Cowboy A, PA-C     Allergies Aspirin; Codeine; Ditropan [oxybutynin]; Hctz [hydrochlorothiazide]; Metronidazole; Propranolol; Tavist [clemastine]; Tramadol; and Vicodin [hydrocodone-acetaminophen]   Family History  Problem Relation Age of Onset  . Leukemia Mother   . Heart disease Father   . Hypertension  Unknown     Social History Social History  Substance Use Topics  . Smoking status: Never Smoker  . Smokeless tobacco: Never Used  . Alcohol use No    Review of Systems  Constitutional:   No fever or chills.  ENT:   No sore throat. No rhinorrhea. Cardiovascular:   No chest pain or syncope. Respiratory:   No dyspnea or cough. Gastrointestinal:   Negative for abdominal pain, vomiting and diarrhea.  Musculoskeletal:   Negative for focal pain or swelling All other systems reviewed and are negative except as documented above in ROS and HPI.  ____________________________________________   PHYSICAL EXAM:  VITAL SIGNS: ED Triage Vitals  Enc Vitals Group     BP --      Pulse Rate 03-25-17 1454 83     Resp March 25, 2017 1454 (!) 21     Temp Mar 25, 2017 1454 98.3 F (36.8  C)     Temp Source 2017-03-18 1454 Oral     SpO2 18-Mar-2017 1454 100 %     Weight 2017/03/18 1445 103 lb (46.7 kg)     Height 03/18/17 1445 4\' 10"  (1.473 m)     Head Circumference --      Peak Flow --      Pain Score 18-Mar-2017 1444 0     Pain Loc --      Pain Edu? --      Excl. in GC? --     Vital signs reviewed, nursing assessments reviewed.   Constitutional:   Alert and oriented. Well appearing and in no distress. Eyes:   No scleral icterus.  EOMI. No nystagmus. No conjunctival pallor. PERRL. ENT   Head:   Normocephalic and atraumatic.   Nose:   No congestion/rhinnorhea.    Mouth/Throat:   MMM, no pharyngeal erythema. No peritonsillar mass.    Neck:   No meningismus. Full ROM Hematological/Lymphatic/Immunilogical:   No cervical lymphadenopathy. Cardiovascular:   RRR. Symmetric bilateral radial and DP pulses.  No murmurs.  Respiratory:   Normal respiratory effort without tachypnea/retractions. Breath sounds are clear and equal bilaterally. No wheezes/rales/rhonchi. Gastrointestinal:   Soft and nontender. Non distended. There is no CVA tenderness.  No rebound, rigidity, or guarding. Genitourinary:    deferred Musculoskeletal:   Normal range of motion in all extremities. No joint effusions.  No lower extremity tenderness.  No edema. Neurologic:   Normal speech and language.  Motor grossly intact. NIH stroke scale 2 Level of Consciousness (1a.)   : Alert, keenly responsive (08/31 1436) LOC Questions (1b. )   +: Answers both questions correctly (08/31 1436) LOC Commands (1c. )   + : Performs both tasks correctly (08/31 1436) Best Gaze (2. )  +: Normal (08/31 1436) Visual (3. )  +: Complete hemianopia (08/31 1436) Facial Palsy (4. )    : Normal symmetrical movements (08/31 1436) Motor Arm, Left (5a. )   +: No drift (08/31 1436) Motor Arm, Right (5b. )   +: No drift (08/31 1436) Motor Leg, Left (6a. )   +: No drift (08/31 1436) Motor Leg, Right (6b. )   +: No drift (08/31 1436) Limb Ataxia (7. ): Absent (08/31 1436) Sensory (8. )   +: Normal, no sensory loss (08/31 1436) Best Language (9. )   +: No aphasia (08/31 1436) Dysarthria (10. ): Normal (08/31 1436) Extinction/Inattention (11.)   +: No Abnormality (08/31 1436) Total: 2 (08/31 1436)   Skin:    Skin is warm, dry and intact. No rash noted.  No petechiae, purpura, or bullae.  ____________________________________________    LABS (pertinent positives/negatives) (all labs ordered are listed, but only abnormal results are displayed) Labs Reviewed - No data to display ____________________________________________   EKG  interpreted by me Atrial fibrillation rate of 70, left axis, incomplete right bundle-branch block. No acute ischemic changes.  ____________________________________________    RADIOLOGY  Mr Angiogram Head Wo Contrast  Result Date: 18-Mar-2017 CLINICAL DATA:  Initial evaluation for acute visual field loss in left eye. History of prior stroke. EXAM: MRI HEAD WITHOUT CONTRAST MRA HEAD WITHOUT CONTRAST MRA NECK WITHOUT CONTRAST TECHNIQUE: Multiplanar, multiecho pulse sequences of the Alyssa Landry and surrounding  structures were obtained without intravenous contrast. Angiographic images of the Circle of Willis were obtained using MRA technique without intravenous contrast. Angiographic images of the neck were obtained using MRA technique without intravenous contrast. Carotid stenosis measurements (when applicable) are obtained utilizing  NASCET criteria, using the distal internal carotid diameter as the denominator. CONTRAST:  None. COMPARISON:  Prior CT from earlier the same day as well as previous MRI from 09/05/2016. FINDINGS: MRI HEAD FINDINGS Advance generalized age-related cerebral atrophy. Patchy and confluent T2/FLAIR hyperintensity within the periventricular and deep white matter both cerebral hemispheres, most consistent with chronic microvascular ischemic disease, also fairly advanced in nature. Overall, changes are similar to previous. Normal expected interval evolution of chronic hemorrhagic left PCA territory infarct. There is a chronic anterior right MCA territory infarct involving the right frontal operculum (series 7, image 16). While this is chronic in appearance, this is new relative to previous MRI from 09/05/2016. Scattered associated chronic blood products an laminar necrosis about this area of chronic infarction. Approximate 1 cm focus of restricted diffusion within the posterior aspect of the mesial right temporal lobe, posterior right hippocampal formation (series 100, image 26), consistent with acute ischemic infarct. No associated mass effect or hemorrhage. Linear susceptibility artifact with mild diffusion abnormality within the adjacent mesial right occipital lobe, suspected to reflect sequelae of late subacute to chronic ischemia (series 100, image 28). This is also new from previous. No other evidence for acute or subacute ischemia. Gray-white matter differentiation otherwise maintained. No evidence for acute intracranial hemorrhage. No mass lesion, midline shift or mass effect. Mild ventricular  prominence related to global parenchymal volume loss without hydrocephalus. No extra-axial fluid collection. Major dural sinuses grossly patent. Pituitary gland within normal limits. Major intracranial vascular flow voids maintained. Degenerative thickening of the tectorial membrane noted. Craniocervical junction otherwise unremarkable without significant stenosis. Visualized upper cervical spine unremarkable. Bone marrow signal intensity within normal limits. No scalp soft tissue abnormality. Globes and orbital soft tissues within normal limits. Patient status post lens extraction bilaterally. Paranasal sinuses are clear. No mastoid effusion. Inner ear structures normal. MRA HEAD FINDINGS ANTERIOR CIRCULATION: Distal cervical segments of the internal carotid arteries are patent with antegrade flow. Petrous segments patent without flow-limiting stenosis. Multifocal atheromatous irregularity within the cavernous ICAs without high-grade stenosis. Supraclinoid segments patent. ICA termini widely patent. A1 segments patent bilaterally. Anterior communicating artery normal. Anterior cerebral artery is widely patent to their distal aspects without flow-limiting stenosis. M1 segments demonstrate multifocal atheromatous irregularity but are widely patent without flow-limiting stenosis or occlusion. MCA bifurcations normal. No proximal M2 occlusion. Distal MCA branches well opacified and symmetric. POSTERIOR CIRCULATION: Vertebral artery's patent to the vertebrobasilar junction without flow-limiting stenosis. Posterior inferior cerebral artery is partially visualized but patent bilaterally. Basilar artery widely patent to its distal aspect without stenosis. Superior cerebral arteries patent bilaterally. Both of the posterior cerebral artery is supplied via the basilar artery. PCAs patent to their distal aspects without proximal stenosis or occlusion. No aneurysm or vascular malformation. MRA NECK FINDINGS Examination limited  due to lack of IV contrast. Aortic arch and origin of great vessels not evaluated on this exam. Visualize right common and internal carotid artery's patent within the neck without obvious flow-limiting stenosis no significant atheromatous narrowing about the right carotid bifurcation. Please note that the proximal right common carotid artery not well evaluated. Visualized left common and internal carotid artery's patent within the neck without obvious flow-limiting stenosis. No significant atheromatous narrowing about the left carotid bifurcation. Please note that the proximal left common carotid artery not well evaluated. Proximal/origin of the vertebral arteries not evaluated. Right vertebral artery patent with antegrade flow without significant stenosis. Focal irregularity with moderate to severe stenosis present within the mid left V2 segment (series 111,  image 10). Left vertebral artery otherwise patent to the skullbase. IMPRESSION: MRI HEAD IMPRESSION: 1. 1 cm acute ischemic nonhemorrhagic infarct involving the mesial right temporal lobe/posterior right hippocampal formation. No associated mass effect. 2. Additional probable late subacute to chronic infarct within the adjacent right occipital lobe (right PCA territory), not acute in appearance, but new relative to most recent MRI. 3. 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 4. Normal expected interval evolution of chronic hemorrhagic left PCA territory infarct. 5. Generalized age-related cerebral atrophy with advanced chronic microvascular ischemic disease. MRA HEAD IMPRESSION: Negative intracranial MRA. No large vessel occlusion. No high-grade or correctable stenosis. MRA NECK IMPRESSION: 1. Limited study due to lack of IV contrast. 2. Patent carotid artery systems bilaterally without obvious flow-limiting or critical stenosis. 3. Short-segment moderate to severe mid left V2 stenosis.  Vertebral arteries otherwise widely patent within the neck. Electronically Signed   By: Rise Mu M.D.   On: 02/23/2017 18:58   Mr Alyssa Landry Neck Wo Contrast  Result Date: 03/01/2017 CLINICAL DATA:  Initial evaluation for acute visual field loss in left eye. History of prior stroke. EXAM: MRI HEAD WITHOUT CONTRAST MRA HEAD WITHOUT CONTRAST MRA NECK WITHOUT CONTRAST TECHNIQUE: Multiplanar, multiecho pulse sequences of the Alyssa Landry and surrounding structures were obtained without intravenous contrast. Angiographic images of the Circle of Willis were obtained using MRA technique without intravenous contrast. Angiographic images of the neck were obtained using MRA technique without intravenous contrast. Carotid stenosis measurements (when applicable) are obtained utilizing NASCET criteria, using the distal internal carotid diameter as the denominator. CONTRAST:  None. COMPARISON:  Prior CT from earlier the same day as well as previous MRI from 09/05/2016. FINDINGS: MRI HEAD FINDINGS Advance generalized age-related cerebral atrophy. Patchy and confluent T2/FLAIR hyperintensity within the periventricular and deep white matter both cerebral hemispheres, most consistent with chronic microvascular ischemic disease, also fairly advanced in nature. Overall, changes are similar to previous. Normal expected interval evolution of chronic hemorrhagic left PCA territory infarct. There is a chronic anterior right MCA territory infarct involving the right frontal operculum (series 7, image 16). While this is chronic in appearance, this is new relative to previous MRI from 09/05/2016. Scattered associated chronic blood products an laminar necrosis about this area of chronic infarction. Approximate 1 cm focus of restricted diffusion within the posterior aspect of the mesial right temporal lobe, posterior right hippocampal formation (series 100, image 26), consistent with acute ischemic infarct. No associated mass effect or  hemorrhage. Linear susceptibility artifact with mild diffusion abnormality within the adjacent mesial right occipital lobe, suspected to reflect sequelae of late subacute to chronic ischemia (series 100, image 28). This is also new from previous. No other evidence for acute or subacute ischemia. Gray-white matter differentiation otherwise maintained. No evidence for acute intracranial hemorrhage. No mass lesion, midline shift or mass effect. Mild ventricular prominence related to global parenchymal volume loss without hydrocephalus. No extra-axial fluid collection. Major dural sinuses grossly patent. Pituitary gland within normal limits. Major intracranial vascular flow voids maintained. Degenerative thickening of the tectorial membrane noted. Craniocervical junction otherwise unremarkable without significant stenosis. Visualized upper cervical spine unremarkable. Bone marrow signal intensity within normal limits. No scalp soft tissue abnormality. Globes and orbital soft tissues within normal limits. Patient status post lens extraction bilaterally. Paranasal sinuses are clear. No mastoid effusion. Inner ear structures normal. MRA HEAD FINDINGS ANTERIOR CIRCULATION: Distal cervical segments of the internal carotid arteries are patent with antegrade flow. Petrous  segments patent without flow-limiting stenosis. Multifocal atheromatous irregularity within the cavernous ICAs without high-grade stenosis. Supraclinoid segments patent. ICA termini widely patent. A1 segments patent bilaterally. Anterior communicating artery normal. Anterior cerebral artery is widely patent to their distal aspects without flow-limiting stenosis. M1 segments demonstrate multifocal atheromatous irregularity but are widely patent without flow-limiting stenosis or occlusion. MCA bifurcations normal. No proximal M2 occlusion. Distal MCA branches well opacified and symmetric. POSTERIOR CIRCULATION: Vertebral artery's patent to the vertebrobasilar  junction without flow-limiting stenosis. Posterior inferior cerebral artery is partially visualized but patent bilaterally. Basilar artery widely patent to its distal aspect without stenosis. Superior cerebral arteries patent bilaterally. Both of the posterior cerebral artery is supplied via the basilar artery. PCAs patent to their distal aspects without proximal stenosis or occlusion. No aneurysm or vascular malformation. MRA NECK FINDINGS Examination limited due to lack of IV contrast. Aortic arch and origin of great vessels not evaluated on this exam. Visualize right common and internal carotid artery's patent within the neck without obvious flow-limiting stenosis no significant atheromatous narrowing about the right carotid bifurcation. Please note that the proximal right common carotid artery not well evaluated. Visualized left common and internal carotid artery's patent within the neck without obvious flow-limiting stenosis. No significant atheromatous narrowing about the left carotid bifurcation. Please note that the proximal left common carotid artery not well evaluated. Proximal/origin of the vertebral arteries not evaluated. Right vertebral artery patent with antegrade flow without significant stenosis. Focal irregularity with moderate to severe stenosis present within the mid left V2 segment (series 111, image 10). Left vertebral artery otherwise patent to the skullbase. IMPRESSION: MRI HEAD IMPRESSION: 1. 1 cm acute ischemic nonhemorrhagic infarct involving the mesial right temporal lobe/posterior right hippocampal formation. No associated mass effect. 2. Additional probable late subacute to chronic infarct within the adjacent right occipital lobe (right PCA territory), not acute in appearance, but new relative to most recent MRI. 3. 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 4. Normal expected interval evolution of chronic hemorrhagic  left PCA territory infarct. 5. Generalized age-related cerebral atrophy with advanced chronic microvascular ischemic disease. MRA HEAD IMPRESSION: Negative intracranial MRA. No large vessel occlusion. No high-grade or correctable stenosis. MRA NECK IMPRESSION: 1. Limited study due to lack of IV contrast. 2. Patent carotid artery systems bilaterally without obvious flow-limiting or critical stenosis. 3. Short-segment moderate to severe mid left V2 stenosis. Vertebral arteries otherwise widely patent within the neck. Electronically Signed   By: Rise Mu M.D.   On: Apr 08, 2017 18:58   Mr Alyssa Landry Wo Contrast  Result Date: 04-08-2017 CLINICAL DATA:  Initial evaluation for acute visual field loss in left eye. History of prior stroke. EXAM: MRI HEAD WITHOUT CONTRAST MRA HEAD WITHOUT CONTRAST MRA NECK WITHOUT CONTRAST TECHNIQUE: Multiplanar, multiecho pulse sequences of the Alyssa Landry and surrounding structures were obtained without intravenous contrast. Angiographic images of the Circle of Willis were obtained using MRA technique without intravenous contrast. Angiographic images of the neck were obtained using MRA technique without intravenous contrast. Carotid stenosis measurements (when applicable) are obtained utilizing NASCET criteria, using the distal internal carotid diameter as the denominator. CONTRAST:  None. COMPARISON:  Prior CT from earlier the same day as well as previous MRI from 09/05/2016. FINDINGS: MRI HEAD FINDINGS Advance generalized age-related cerebral atrophy. Patchy and confluent T2/FLAIR hyperintensity within the periventricular and deep white matter both cerebral hemispheres, most consistent with chronic microvascular ischemic disease, also fairly advanced in nature. Overall, changes are  similar to previous. Normal expected interval evolution of chronic hemorrhagic left PCA territory infarct. There is a chronic anterior right MCA territory infarct involving the right frontal operculum  (series 7, image 16). While this is chronic in appearance, this is new relative to previous MRI from 09/05/2016. Scattered associated chronic blood products an laminar necrosis about this area of chronic infarction. Approximate 1 cm focus of restricted diffusion within the posterior aspect of the mesial right temporal lobe, posterior right hippocampal formation (series 100, image 26), consistent with acute ischemic infarct. No associated mass effect or hemorrhage. Linear susceptibility artifact with mild diffusion abnormality within the adjacent mesial right occipital lobe, suspected to reflect sequelae of late subacute to chronic ischemia (series 100, image 28). This is also new from previous. No other evidence for acute or subacute ischemia. Gray-white matter differentiation otherwise maintained. No evidence for acute intracranial hemorrhage. No mass lesion, midline shift or mass effect. Mild ventricular prominence related to global parenchymal volume loss without hydrocephalus. No extra-axial fluid collection. Major dural sinuses grossly patent. Pituitary gland within normal limits. Major intracranial vascular flow voids maintained. Degenerative thickening of the tectorial membrane noted. Craniocervical junction otherwise unremarkable without significant stenosis. Visualized upper cervical spine unremarkable. Bone marrow signal intensity within normal limits. No scalp soft tissue abnormality. Globes and orbital soft tissues within normal limits. Patient status post lens extraction bilaterally. Paranasal sinuses are clear. No mastoid effusion. Inner ear structures normal. MRA HEAD FINDINGS ANTERIOR CIRCULATION: Distal cervical segments of the internal carotid arteries are patent with antegrade flow. Petrous segments patent without flow-limiting stenosis. Multifocal atheromatous irregularity within the cavernous ICAs without high-grade stenosis. Supraclinoid segments patent. ICA termini widely patent. A1 segments  patent bilaterally. Anterior communicating artery normal. Anterior cerebral artery is widely patent to their distal aspects without flow-limiting stenosis. M1 segments demonstrate multifocal atheromatous irregularity but are widely patent without flow-limiting stenosis or occlusion. MCA bifurcations normal. No proximal M2 occlusion. Distal MCA branches well opacified and symmetric. POSTERIOR CIRCULATION: Vertebral artery's patent to the vertebrobasilar junction without flow-limiting stenosis. Posterior inferior cerebral artery is partially visualized but patent bilaterally. Basilar artery widely patent to its distal aspect without stenosis. Superior cerebral arteries patent bilaterally. Both of the posterior cerebral artery is supplied via the basilar artery. PCAs patent to their distal aspects without proximal stenosis or occlusion. No aneurysm or vascular malformation. MRA NECK FINDINGS Examination limited due to lack of IV contrast. Aortic arch and origin of great vessels not evaluated on this exam. Visualize right common and internal carotid artery's patent within the neck without obvious flow-limiting stenosis no significant atheromatous narrowing about the right carotid bifurcation. Please note that the proximal right common carotid artery not well evaluated. Visualized left common and internal carotid artery's patent within the neck without obvious flow-limiting stenosis. No significant atheromatous narrowing about the left carotid bifurcation. Please note that the proximal left common carotid artery not well evaluated. Proximal/origin of the vertebral arteries not evaluated. Right vertebral artery patent with antegrade flow without significant stenosis. Focal irregularity with moderate to severe stenosis present within the mid left V2 segment (series 111, image 10). Left vertebral artery otherwise patent to the skullbase. IMPRESSION: MRI HEAD IMPRESSION: 1. 1 cm acute ischemic nonhemorrhagic infarct  involving the mesial right temporal lobe/posterior right hippocampal formation. No associated mass effect. 2. Additional probable late subacute to chronic infarct within the adjacent right occipital lobe (right PCA territory), not acute in appearance, but new relative to most recent MRI. 3. Chronic hemorrhagic anterior right MCA  territory infarct, also new relative to most recent MRI from 09/05/2016. 4. Normal expected interval evolution of chronic hemorrhagic left PCA territory infarct. 5. Generalized age-related cerebral atrophy with advanced chronic microvascular ischemic disease. MRA HEAD IMPRESSION: Negative intracranial MRA. No large vessel occlusion. No high-grade or correctable stenosis. MRA NECK IMPRESSION: 1. Limited study due to lack of IV contrast. 2. Patent carotid artery systems bilaterally without obvious flow-limiting or critical stenosis. 3. Short-segment moderate to severe mid left V2 stenosis. Vertebral arteries otherwise widely patent within the neck. Electronically Signed   By: Rise Mu M.D.   On: 03/22/17 18:58   Ct Head Code Stroke Wo Contrast`  Result Date: 22-Mar-2017 CLINICAL DATA:  Code stroke.  Vision loss and neck pain. EXAM: CT HEAD WITHOUT CONTRAST TECHNIQUE: Contiguous axial images were obtained from the base of the skull through the vertex without intravenous contrast. COMPARISON:  02/14/2017 FINDINGS: Alyssa Landry: Moderate remote right frontal infarct affecting the operculum and anterior insula. Remote moderate left parasagittal occipital infarct. Patchy chronic microvascular ischemic gliosis in the cerebral white matter. Age congruent generalized cerebral volume loss. No hemorrhage, hydrocephalus or masslike finding. Vascular: Atherosclerotic calcification.  No hyperdense vessel. Skull: Negative. Sinuses/Orbits: Incomplete coverage of the orbits without acute finding. Bilateral cataract resection. Other: These results were called by telephone at the time of  interpretation on 2017/03/22 at 2:37 pm to Dr. Scotty Court , who verbally acknowledged these results. ASPECTS [facility] Stroke Program Early CT Score) Not scored with this symptomatology IMPRESSION: 1. No acute finding or change from prior. 2. 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 Electronically Signed   By: Marnee Spring M.D.   On: 03-22-2017 14:42    ____________________________________________   PROCEDURES Procedures  ____________________________________________   INITIAL IMPRESSION / ASSESSMENT AND PLAN / ED COURSE  Pertinent labs & imaging results that were available during my care of the patient were reviewed by me and considered in my medical decision making (see chart for details).    Clinical Course as of Mar 12 2033  Fri 2017-03-22  1441 D/w rads. CT head NAD.  [PS]  1442 P/w visual disturbance. Possible acute CVA. H/o multiple prior CVAs.  No acute bleed. Not a tpa candidate per Dr. Thad Ranger. Recommends hospitalization for repeat stroke workup since last cardiovascular imaging was performed >58months ago.  [PS]  1531 When neurology advised pt of need for hospitalization, she responded that she would stay if it's under admission status, but not if it is observation.  Unless there is a demonstrable acute lesion on MRI, unlikely to be admittable.  Case management not available in ED. Will obtain MRIs, continue discussion with patient.   [PS]  1904 MRI demonstrates acute ischemic stroke in right temporal lobe, also evidence of multiple subacute strokes.  Will d/w pt to recommend further eval in hospital.   [PS]  2006 had a long conversation with the patient and daughter. They reported simply not financially feasible to be in the hospital under observation status. Unfortunately I am not able to guarantee how the encounter will be billed and suspect that it would in fact be an observation.  They will considerand make a determination that makes no sense  for them shortly. Given the patient's age and comorbidities and inability to take any antiplatelet or anticoagulant medications apart from aspirin with her history of head bleeds, there is not any clear benefit to hospitalization other than expedited workup. The patient has family who can monitor her condition and  bring her back to the ED if she suffers any complications or deterioration of mental status.  [PS]    Clinical Course User Index [PS] Sharman CheekStafford, Aarian Griffie, MD     ----------------------------------------- 8:34 PM on 01-22-2017 -----------------------------------------  Family's decided on hospitalization. We'll discuss with the hospitalists.  ____________________________________________   FINAL CLINICAL IMPRESSION(S) / ED DIAGNOSES  Final diagnoses:  Acute ischemic stroke (HCC)  Right-sided headache      New Prescriptions   No medications on file     Portions of this note were generated with dragon dictation software. Dictation errors may occur despite best attempts at proofreading.    Sharman CheekStafford, Tashanna Dolin, MD 15-Jun-2017 2035

## 2017-03-12 NOTE — ED Notes (Signed)
Pt transported to room 117  

## 2017-03-12 NOTE — ED Notes (Signed)
Patient unable to see fingers to the left.

## 2017-03-12 NOTE — ED Notes (Signed)
Dr. Emmit PomfretHUGELMEYER requesting to see patient before she goes to the floor.

## 2017-03-12 NOTE — ED Triage Notes (Signed)
Pt to ED via family from home c/o visual field loss in the left eye.  Family states started approx. 1300.  Pt with hx of prior stroke with right eye visual deficit.  Pt denies pain, numbness, or tingling.

## 2017-03-12 NOTE — ED Notes (Signed)
Pt assisted up to bathroom by this nurse.

## 2017-03-13 ENCOUNTER — Observation Stay
Admit: 2017-03-13 | Discharge: 2017-03-13 | Disposition: A | Discharge status: Home / Self Care | Payer: Medicare Other | Attending: Family Medicine | Admitting: Family Medicine

## 2017-03-13 ENCOUNTER — Observation Stay: Payer: Medicare Other

## 2017-03-13 DIAGNOSIS — E039 Hypothyroidism, unspecified: Secondary | ICD-10-CM | POA: Diagnosis present

## 2017-03-13 DIAGNOSIS — Z885 Allergy status to narcotic agent status: Secondary | ICD-10-CM | POA: Diagnosis not present

## 2017-03-13 DIAGNOSIS — R29702 NIHSS score 2: Secondary | ICD-10-CM | POA: Diagnosis present

## 2017-03-13 DIAGNOSIS — Z66 Do not resuscitate: Secondary | ICD-10-CM | POA: Diagnosis present

## 2017-03-13 DIAGNOSIS — I639 Cerebral infarction, unspecified: Secondary | ICD-10-CM | POA: Diagnosis present

## 2017-03-13 DIAGNOSIS — R51 Headache: Secondary | ICD-10-CM | POA: Diagnosis present

## 2017-03-13 DIAGNOSIS — J8410 Pulmonary fibrosis, unspecified: Secondary | ICD-10-CM | POA: Diagnosis present

## 2017-03-13 DIAGNOSIS — L8990 Pressure ulcer of unspecified site, unspecified stage: Secondary | ICD-10-CM | POA: Insufficient documentation

## 2017-03-13 DIAGNOSIS — Z888 Allergy status to other drugs, medicaments and biological substances status: Secondary | ICD-10-CM | POA: Diagnosis not present

## 2017-03-13 DIAGNOSIS — M1990 Unspecified osteoarthritis, unspecified site: Secondary | ICD-10-CM | POA: Diagnosis present

## 2017-03-13 DIAGNOSIS — Z7982 Long term (current) use of aspirin: Secondary | ICD-10-CM | POA: Diagnosis not present

## 2017-03-13 DIAGNOSIS — Z79899 Other long term (current) drug therapy: Secondary | ICD-10-CM | POA: Diagnosis not present

## 2017-03-13 DIAGNOSIS — Z90710 Acquired absence of both cervix and uterus: Secondary | ICD-10-CM | POA: Diagnosis not present

## 2017-03-13 DIAGNOSIS — M810 Age-related osteoporosis without current pathological fracture: Secondary | ICD-10-CM | POA: Diagnosis present

## 2017-03-13 DIAGNOSIS — E222 Syndrome of inappropriate secretion of antidiuretic hormone: Secondary | ICD-10-CM | POA: Diagnosis present

## 2017-03-13 DIAGNOSIS — Z8249 Family history of ischemic heart disease and other diseases of the circulatory system: Secondary | ICD-10-CM | POA: Diagnosis not present

## 2017-03-13 DIAGNOSIS — I4891 Unspecified atrial fibrillation: Secondary | ICD-10-CM | POA: Diagnosis present

## 2017-03-13 DIAGNOSIS — I110 Hypertensive heart disease with heart failure: Secondary | ICD-10-CM | POA: Diagnosis present

## 2017-03-13 DIAGNOSIS — Z7951 Long term (current) use of inhaled steroids: Secondary | ICD-10-CM | POA: Diagnosis not present

## 2017-03-13 DIAGNOSIS — K219 Gastro-esophageal reflux disease without esophagitis: Secondary | ICD-10-CM | POA: Diagnosis present

## 2017-03-13 DIAGNOSIS — Z806 Family history of leukemia: Secondary | ICD-10-CM | POA: Diagnosis not present

## 2017-03-13 DIAGNOSIS — E785 Hyperlipidemia, unspecified: Secondary | ICD-10-CM | POA: Diagnosis present

## 2017-03-13 DIAGNOSIS — I69398 Other sequelae of cerebral infarction: Secondary | ICD-10-CM | POA: Diagnosis not present

## 2017-03-13 DIAGNOSIS — H538 Other visual disturbances: Secondary | ICD-10-CM | POA: Diagnosis present

## 2017-03-13 DIAGNOSIS — J449 Chronic obstructive pulmonary disease, unspecified: Secondary | ICD-10-CM | POA: Diagnosis present

## 2017-03-13 DIAGNOSIS — I5031 Acute diastolic (congestive) heart failure: Secondary | ICD-10-CM | POA: Diagnosis present

## 2017-03-13 LAB — ECHOCARDIOGRAM COMPLETE
Height: 58 in
Weight: 1756.8 oz

## 2017-03-13 LAB — CBC
HEMATOCRIT: 30.8 % — AB (ref 35.0–47.0)
Hemoglobin: 10.3 g/dL — ABNORMAL LOW (ref 12.0–16.0)
MCH: 28.8 pg (ref 26.0–34.0)
MCHC: 33.4 g/dL (ref 32.0–36.0)
MCV: 86.3 fL (ref 80.0–100.0)
Platelets: 230 10*3/uL (ref 150–440)
RBC: 3.57 MIL/uL — ABNORMAL LOW (ref 3.80–5.20)
RDW: 14.2 % (ref 11.5–14.5)
WBC: 7.2 10*3/uL (ref 3.6–11.0)

## 2017-03-13 LAB — MRSA PCR SCREENING: MRSA by PCR: POSITIVE — AB

## 2017-03-13 LAB — TROPONIN I
Troponin I: 0.03 ng/mL (ref <0.03)
Troponin I: 0.03 ng/mL (ref <0.03)
Troponin I: 0.03 ng/mL (ref <0.03)

## 2017-03-13 LAB — HEMOGLOBIN A1C
Hgb A1c MFr Bld: 5.4 % (ref 4.8–5.6)
Mean Plasma Glucose: 108.28 mg/dL

## 2017-03-13 LAB — LIPID PANEL
CHOLESTEROL: 87 mg/dL (ref 0–200)
HDL: 46 mg/dL (ref >40)
LDL Cholesterol: 37 mg/dL (ref 0–99)
TRIGLYCERIDES: 21 mg/dL (ref <150)
Total CHOL/HDL Ratio: 1.9 RATIO
VLDL: 4 mg/dL (ref 0–40)

## 2017-03-13 LAB — CREATININE, SERUM
Creatinine, Ser: 0.55 mg/dL (ref 0.44–1.00)
GFR calc non Af Amer: >60 mL/min (ref >60)

## 2017-03-13 MED ORDER — BUDESONIDE 0.25 MG/2ML IN SUSP
0.2500 mg | Freq: Two times a day (BID) | RESPIRATORY_TRACT | Status: DC
  Administered 2017-03-13 – 2017-03-15 (×5): 0.25 mg via RESPIRATORY_TRACT
  Filled 2017-03-13 (×6): qty 2

## 2017-03-13 MED ORDER — FUROSEMIDE 10 MG/ML IJ SOLN
40.0000 mg | INTRAMUSCULAR | Status: AC
  Administered 2017-03-13: 40 mg via INTRAVENOUS
  Filled 2017-03-13: qty 4

## 2017-03-13 MED ORDER — ENOXAPARIN SODIUM 30 MG/0.3ML ~~LOC~~ SOLN
30.0000 mg | SUBCUTANEOUS | Status: DC
  Administered 2017-03-13 – 2017-03-15 (×3): 30 mg via SUBCUTANEOUS
  Filled 2017-03-13 (×3): qty 0.3

## 2017-03-13 NOTE — [place] FL2 (Signed)
[place] MEDICAID FL2 LEVEL OF CARE SCREENING TOOL     IDENTIFICATION  Patient Name: Alyssa Landry Birthdate: 04/13/1919 Sex: female Admission Date (Current Location): 02/18/2017  [place]   Facility and Address:  [facility], [address], [place], [place] [address]      Provider Number: [id number]  Attending Physician Name and Address:  Delfino LovettShah, Vipul, MD  Relative Name and Phone Number:       Current Level of Care: Hospital Recommended Level of Care: Skilled Nursing Facility Prior Approval Number:    Date Approved/Denied:   PASRR Number:  [id number] )  Discharge Plan: SNF    Current Diagnoses: Patient Active Problem List   Diagnosis Date Noted  . Pressure injury of skin 03/13/2017  . Stroke (cerebrum) (HCC) 03/13/2017  . CVA (cerebral vascular accident) (HCC) 02/21/2017  . Sepsis (HCC) 01/28/2017  . HCAP (healthcare-associated pneumonia) 10/08/2016  . TIA (transient ischemic attack) 09/04/2016  . Stroke (HCC) 08/20/2016  . CHF (congestive heart failure) (HCC) 05/01/2016  . Chronic diastolic heart failure (HCC) 04/16/2016  . HTN (hypertension) 04/16/2016  . Hypoxia   . SOB (shortness of breath)   . Collapse of left lung   . Pleural effusion   . Centrilobular emphysema (HCC)   . Hyponatremia   . Pressure ulcer 02/19/2016  . Atrial fibrillation with RVR (HCC) 12/11/2015  . Urge incontinence 09/26/2015  . Lichenified rash 09/26/2015  . Syncopal episodes 09/25/2015    Orientation RESPIRATION BLADDER Height & Weight     Self, Time  O2 (2 Liters Oxygen ) Continent Weight: 109 lb 12.8 oz (49.8 kg) Height:  4\' 10"  (147.3 cm)  BEHAVIORAL SYMPTOMS/MOOD NEUROLOGICAL BOWEL NUTRITION STATUS      Continent Diet (Diet: DYS 3 )  AMBULATORY STATUS COMMUNICATION OF NEEDS Skin   Extensive Assist Verbally PU Stage and Appropriate Care (Pressure Ulcer Stage 1 on buttocks. )                       Personal  Care Assistance Level of Assistance  Bathing, Feeding, Dressing Bathing Assistance: Limited assistance Feeding assistance: Independent Dressing Assistance: Limited assistance     Functional Limitations Info  Sight, Hearing, Speech Sight Info: Adequate Hearing Info: Impaired Speech Info: Adequate    SPECIAL CARE FACTORS FREQUENCY  PT (By licensed PT), OT (By licensed OT)     PT Frequency:  (5) OT Frequency:  (5)            Contractures      Additional Factors Info  Code Status, Allergies, Isolation Precautions Code Status Info:  (DNR ) Allergies Info:  (Aspirin, Codeine, Ditropan Oxybutynin, Hctz Hydrochlorothiazide, Metronidazole, Propranolol, Tavist Clemastine, Tramadol, Vicodin Hydrocodone-acetaminophen)     Isolation Precautions Info:  (MRSA Nasal Swab. )     Current Medications (03/13/2017):  This is the current hospital active medication list Current Facility-Administered Medications  Medication Dose Route Frequency Provider Last Rate Last Dose  . acetaminophen (TYLENOL) tablet 650 mg  650 mg Oral Q8H PRN Hugelmeyer, Alexis, DO      . albuterol (PROVENTIL) (2.5 MG/3ML) 0.083% nebulizer solution 3 mL  3 mL Inhalation Q4H PRN Hugelmeyer, Alexis, DO   3 mL at 03/13/17 0851  . aspirin EC tablet 81 mg  81 mg Oral Daily Hugelmeyer, Alexis, DO   81 mg at 03/13/17 0917  . atorvastatin (LIPITOR) tablet 40 mg  40 mg Oral q1800 Hugelmeyer, Alexis, DO      .  budesonide (PULMICORT) nebulizer solution 0.25 mg  0.25 mg Nebulization BID Hugelmeyer, Alexis, DO   0.25 mg at 03/13/17 0827  . cholecalciferol (VITAMIN D) tablet 1,000 Units  1,000 Units Oral Daily Hugelmeyer, Alexis, DO   1,000 Units at 03/13/17 0912  . diltiazem (CARDIZEM CD) 24 hr capsule 120 mg  120 mg Oral Daily Hugelmeyer, Alexis, DO   120 mg at 03/13/17 0917  . docusate sodium (COLACE) capsule 100 mg  100 mg Oral BID Hugelmeyer, Alexis, DO   100 mg at 03/13/17 0917  . enoxaparin (LOVENOX) injection 30 mg  30 mg  Subcutaneous Q24H Hugelmeyer, Alexis, DO   30 mg at 03/13/17 0928  . famotidine (PEPCID) tablet 20 mg  20 mg Oral BID Hugelmeyer, Alexis, DO   20 mg at 03/13/17 0917  . feeding supplement (ENSURE ENLIVE) (ENSURE ENLIVE) liquid 237 mL  237 mL Oral Daily Hugelmeyer, Alexis, DO   237 mL at 03/13/17 1302  . ferrous sulfate tablet 325 mg  325 mg Oral Daily Hugelmeyer, Alexis, DO   325 mg at 03/13/17 0927  . [START ON 03/14/2017] furosemide (LASIX) tablet 20 mg  20 mg Oral QODAY Hugelmeyer, Alexis, DO      . levothyroxine (SYNTHROID, LEVOTHROID) tablet 125 mcg  125 mcg Oral QAC breakfast Hugelmeyer, Alexis, DO   125 mcg at 03/13/17 0917  . multivitamin-lutein (OCUVITE-LUTEIN) capsule 2 capsule  2 capsule Oral Daily Hugelmeyer, Alexis, DO   1 capsule at 03/13/17 0927  . Oxybutynin Chloride 10 % GEL 1 application  1 packet Transdermal Daily Hugelmeyer, Alexis, DO      . polyethylene glycol (MIRALAX / GLYCOLAX) packet 8.5-17 g  8.5-17 g Oral Daily Hugelmeyer, Alexis, DO   17 g at 03/13/17 0928  . sodium bicarbonate tablet 325 mg  325 mg Oral QID Hugelmeyer, Alexis, DO   325 mg at 03/13/17 0912  . spironolactone (ALDACTONE) tablet 25 mg  25 mg Oral Daily Hugelmeyer, Alexis, DO   25 mg at 03/13/17 4098  . tiotropium [facility]) inhalation capsule 18 mcg  18 mcg Inhalation Daily Hugelmeyer, Alexis, DO   18 mcg at 03/13/17 0928  . vitamin B-12 (CYANOCOBALAMIN) tablet 1,000 mcg  1,000 mcg Oral Daily Hugelmeyer, Alexis, DO   1,000 mcg at 03/13/17 1191     Discharge Medications: Please see discharge summary for a list of discharge medications.  Relevant Imaging Results:  Relevant Lab Results:   Additional Information  (SSN: [id number])  Elliana Bal, Darleen Crocker, LCSW

## 2017-03-13 NOTE — Progress Notes (Addendum)
[employer] - [place] [facility]   PATIENT NAME: Alyssa Landry    MR#:  [id number]  DATE OF BIRTH:  25-Sep-1918  SUBJECTIVE:  CHIEF COMPLAINT:   Chief Complaint  Patient presents with  . Code Stroke  feels fine. No complaints. Rt neck pain, some sob REVIEW OF SYSTEMS:  Review of Systems  Constitutional: Negative for chills, fever and weight loss.  HENT: Negative for nosebleeds and sore throat.   Eyes: Negative for blurred vision.  Respiratory: Negative for cough, shortness of breath and wheezing.   Cardiovascular: Negative for chest pain, orthopnea, leg swelling and PND.  Gastrointestinal: Negative for abdominal pain, constipation, diarrhea, heartburn, nausea and vomiting.  Genitourinary: Negative for dysuria and urgency.  Musculoskeletal: Positive for neck pain. Negative for back pain.  Skin: Negative for rash.  Neurological: Negative for dizziness, speech change, focal weakness and headaches.  Endo/Heme/Allergies: Does not bruise/bleed easily.  Psychiatric/Behavioral: Negative for depression.   DRUG ALLERGIES:   Allergies  Allergen Reactions  . Aspirin Other (See Comments)    Upset stomach with high doses  . Codeine Other (See Comments)    Reaction:  Unknown   . Ditropan [Oxybutynin] Other (See Comments)    Reaction:  Unknown   . Hctz [Hydrochlorothiazide] Other (See Comments)    Reaction:  Causes pts sodium/potassium levels to drop significantly   . Metronidazole Other (See Comments)    Reaction:  Unknown   . Propranolol Other (See Comments)    Reaction:  Unknown   . Tavist [Clemastine] Other (See Comments)    Reaction:  Unknown   . Tramadol Other (See Comments)    Reaction:  Unknown   . Vicodin [Hydrocodone-Acetaminophen] Other (See Comments)    Reaction:  Unknown    VITALS:  Blood pressure (!) 143/63, pulse 88, temperature 98.2 F (36.8 C), resp. rate 18, height 4\' 10"  (1.473 m), weight 49.8 kg (109 lb 12.8 oz), SpO2 97 %. PHYSICAL  EXAMINATION:  Physical Exam  Constitutional: She is oriented to person, place, and time and well-developed, well-nourished, and in no distress.  HENT:  Head: Normocephalic and atraumatic.  Eyes: Pupils are equal, round, and reactive to light. Conjunctivae and EOM are normal.  Neck: Normal range of motion. Neck supple. No tracheal deviation present. No thyromegaly present.  Cardiovascular: Normal rate, regular rhythm and normal heart sounds.   Pulmonary/Chest: Effort normal and breath sounds normal. No respiratory distress. She has no wheezes. She exhibits no tenderness.  Abdominal: Soft. Bowel sounds are normal. She exhibits no distension. There is no tenderness.  Musculoskeletal: Normal range of motion.  Neurological: She is alert and oriented to person, place, and time. A cranial nerve deficit is present.  Unable to see from the left eye  Skin: Skin is warm and dry. No rash noted.  Psychiatric: Mood and affect normal.   LABORATORY PANEL:  Female CBC  Recent Labs Lab 03/13/17 0025  WBC 7.2  HGB 10.3*  HCT 30.8*  PLT 230   ------------------------------------------------------------------------------------------------------------------ Chemistries   Recent Labs Lab 03/13/17 0025  CREATININE 0.55   RADIOLOGY:  Dg Chest 1 View  Result Date: 03/13/2017 CLINICAL DATA:  Asthma exacerbation with shortness of breath that worsened after her breathing treatments. EXAM: Portable CHEST 1 VIEW COMPARISON:  02/14/2017, 01/28/2017 and earlier, including CT chest 02/21/2016. FINDINGS: Cardiac silhouette moderately enlarged, unchanged. Thoracic aorta atherosclerotic, unchanged. Hilar and mediastinal contours otherwise unremarkable. Chronic moderately large left pleural effusion, unchanged since the x-ray 3 weeks ago, slightly increased in  size since March, 2018. Associated consolidation in the left lower lobe. Pulmonary venous hypertension and mild interstitial pulmonary edema, new since the most  recent prior examination. Small right pleural effusion suspected. Fluid in the major and minor fissure on the right as noted previously. IMPRESSION: 1. Mild CHF suspected, with stable moderate cardiac enlargement and mild interstitial pulmonary edema. 2. Stable moderately large chronic left pleural effusion and associated passive atelectasis in the left lower lobe. 3.  Aortic Atherosclerosis (ICD10-170.0) Electronically Signed   By: Hulan Saas M.D.   On: 03/13/2017 10:27   Mr Angiogram Head Wo Contrast  Result Date: 04/04/2017 CLINICAL DATA:  Initial evaluation for acute visual field loss in left eye. History of prior stroke. EXAM: MRI HEAD WITHOUT CONTRAST MRA HEAD WITHOUT CONTRAST MRA NECK WITHOUT CONTRAST TECHNIQUE: Multiplanar, multiecho pulse sequences of the brain and surrounding structures were obtained without intravenous contrast. Angiographic images of the Circle of Willis were obtained using MRA technique without intravenous contrast. Angiographic images of the neck were obtained using MRA technique without intravenous contrast. Carotid stenosis measurements (when applicable) are obtained utilizing NASCET criteria, using the distal internal carotid diameter as the denominator. CONTRAST:  None. COMPARISON:  Prior CT from earlier the same day as well as previous MRI from 09/05/2016. FINDINGS: MRI HEAD FINDINGS Advance generalized age-related cerebral atrophy. Patchy and confluent T2/FLAIR hyperintensity within the periventricular and deep white matter both cerebral hemispheres, most consistent with chronic microvascular ischemic disease, also fairly advanced in nature. Overall, changes are similar to previous. Normal expected interval evolution of chronic hemorrhagic left PCA territory infarct. There is a chronic anterior right MCA territory infarct involving the right frontal operculum (series 7, image 16). While this is chronic in appearance, this is new relative to previous MRI from 09/05/2016.  Scattered associated chronic blood products an laminar necrosis about this area of chronic infarction. Approximate 1 cm focus of restricted diffusion within the posterior aspect of the mesial right temporal lobe, posterior right hippocampal formation (series 100, image 26), consistent with acute ischemic infarct. No associated mass effect or hemorrhage. Linear susceptibility artifact with mild diffusion abnormality within the adjacent mesial right occipital lobe, suspected to reflect sequelae of late subacute to chronic ischemia (series 100, image 28). This is also new from previous. No other evidence for acute or subacute ischemia. Gray-white matter differentiation otherwise maintained. No evidence for acute intracranial hemorrhage. No mass lesion, midline shift or mass effect. Mild ventricular prominence related to global parenchymal volume loss without hydrocephalus. No extra-axial fluid collection. Major dural sinuses grossly patent. Pituitary gland within normal limits. Major intracranial vascular flow voids maintained. Degenerative thickening of the tectorial membrane noted. Craniocervical junction otherwise unremarkable without significant stenosis. Visualized upper cervical spine unremarkable. Bone marrow signal intensity within normal limits. No scalp soft tissue abnormality. Globes and orbital soft tissues within normal limits. Patient status post lens extraction bilaterally. Paranasal sinuses are clear. No mastoid effusion. Inner ear structures normal. MRA HEAD FINDINGS ANTERIOR CIRCULATION: Distal cervical segments of the internal carotid arteries are patent with antegrade flow. Petrous segments patent without flow-limiting stenosis. Multifocal atheromatous irregularity within the cavernous ICAs without high-grade stenosis. Supraclinoid segments patent. ICA termini widely patent. A1 segments patent bilaterally. Anterior communicating artery normal. Anterior cerebral artery is widely patent to their  distal aspects without flow-limiting stenosis. M1 segments demonstrate multifocal atheromatous irregularity but are widely patent without flow-limiting stenosis or occlusion. MCA bifurcations normal. No proximal M2 occlusion. Distal MCA branches well opacified and symmetric. POSTERIOR CIRCULATION: Vertebral artery's  patent to the vertebrobasilar junction without flow-limiting stenosis. Posterior inferior cerebral artery is partially visualized but patent bilaterally. Basilar artery widely patent to its distal aspect without stenosis. Superior cerebral arteries patent bilaterally. Both of the posterior cerebral artery is supplied via the basilar artery. PCAs patent to their distal aspects without proximal stenosis or occlusion. No aneurysm or vascular malformation. MRA NECK FINDINGS Examination limited due to lack of IV contrast. Aortic arch and origin of great vessels not evaluated on this exam. Visualize right common and internal carotid artery's patent within the neck without obvious flow-limiting stenosis no significant atheromatous narrowing about the right carotid bifurcation. Please note that the proximal right common carotid artery not well evaluated. Visualized left common and internal carotid artery's patent within the neck without obvious flow-limiting stenosis. No significant atheromatous narrowing about the left carotid bifurcation. Please note that the proximal left common carotid artery not well evaluated. Proximal/origin of the vertebral arteries not evaluated. Right vertebral artery patent with antegrade flow without significant stenosis. Focal irregularity with moderate to severe stenosis present within the mid left V2 segment (series 111, image 10). Left vertebral artery otherwise patent to the skullbase. IMPRESSION: MRI HEAD IMPRESSION: 1. 1 cm acute ischemic nonhemorrhagic infarct involving the mesial right temporal lobe/posterior right hippocampal formation. No associated mass effect. 2.  Additional probable late subacute to chronic infarct within the adjacent right occipital lobe (right PCA territory), not acute in appearance, but new relative to most recent MRI. 3. 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 4. Normal expected interval evolution of chronic hemorrhagic left PCA territory infarct. 5. Generalized age-related cerebral atrophy with advanced chronic microvascular ischemic disease. MRA HEAD IMPRESSION: Negative intracranial MRA. No large vessel occlusion. No high-grade or correctable stenosis. MRA NECK IMPRESSION: 1. Limited study due to lack of IV contrast. 2. Patent carotid artery systems bilaterally without obvious flow-limiting or critical stenosis. 3. Short-segment moderate to severe mid left V2 stenosis. Vertebral arteries otherwise widely patent within the neck. Electronically Signed   By: Rise Mu M.D.   On: March 27, 2017 18:58   Mr Maxine Glenn Neck Wo Contrast  Result Date: 03-27-17 CLINICAL DATA:  Initial evaluation for acute visual field loss in left eye. History of prior stroke. EXAM: MRI HEAD WITHOUT CONTRAST MRA HEAD WITHOUT CONTRAST MRA NECK WITHOUT CONTRAST TECHNIQUE: Multiplanar, multiecho pulse sequences of the brain and surrounding structures were obtained without intravenous contrast. Angiographic images of the Circle of Willis were obtained using MRA technique without intravenous contrast. Angiographic images of the neck were obtained using MRA technique without intravenous contrast. Carotid stenosis measurements (when applicable) are obtained utilizing NASCET criteria, using the distal internal carotid diameter as the denominator. CONTRAST:  None. COMPARISON:  Prior CT from earlier the same day as well as previous MRI from 09/05/2016. FINDINGS: MRI HEAD FINDINGS Advance generalized age-related cerebral atrophy. Patchy and confluent T2/FLAIR hyperintensity within the periventricular and deep  white matter both cerebral hemispheres, most consistent with chronic microvascular ischemic disease, also fairly advanced in nature. Overall, changes are similar to previous. Normal expected interval evolution of chronic hemorrhagic left PCA territory infarct. There is a chronic anterior right MCA territory infarct involving the right frontal operculum (series 7, image 16). While this is chronic in appearance, this is new relative to previous MRI from 09/05/2016. Scattered associated chronic blood products an laminar necrosis about this area of chronic infarction. Approximate 1 cm focus of restricted diffusion within the posterior aspect of the mesial  right temporal lobe, posterior right hippocampal formation (series 100, image 26), consistent with acute ischemic infarct. No associated mass effect or hemorrhage. Linear susceptibility artifact with mild diffusion abnormality within the adjacent mesial right occipital lobe, suspected to reflect sequelae of late subacute to chronic ischemia (series 100, image 28). This is also new from previous. No other evidence for acute or subacute ischemia. Gray-white matter differentiation otherwise maintained. No evidence for acute intracranial hemorrhage. No mass lesion, midline shift or mass effect. Mild ventricular prominence related to global parenchymal volume loss without hydrocephalus. No extra-axial fluid collection. Major dural sinuses grossly patent. Pituitary gland within normal limits. Major intracranial vascular flow voids maintained. Degenerative thickening of the tectorial membrane noted. Craniocervical junction otherwise unremarkable without significant stenosis. Visualized upper cervical spine unremarkable. Bone marrow signal intensity within normal limits. No scalp soft tissue abnormality. Globes and orbital soft tissues within normal limits. Patient status post lens extraction bilaterally. Paranasal sinuses are clear. No mastoid effusion. Inner ear structures  normal. MRA HEAD FINDINGS ANTERIOR CIRCULATION: Distal cervical segments of the internal carotid arteries are patent with antegrade flow. Petrous segments patent without flow-limiting stenosis. Multifocal atheromatous irregularity within the cavernous ICAs without high-grade stenosis. Supraclinoid segments patent. ICA termini widely patent. A1 segments patent bilaterally. Anterior communicating artery normal. Anterior cerebral artery is widely patent to their distal aspects without flow-limiting stenosis. M1 segments demonstrate multifocal atheromatous irregularity but are widely patent without flow-limiting stenosis or occlusion. MCA bifurcations normal. No proximal M2 occlusion. Distal MCA branches well opacified and symmetric. POSTERIOR CIRCULATION: Vertebral artery's patent to the vertebrobasilar junction without flow-limiting stenosis. Posterior inferior cerebral artery is partially visualized but patent bilaterally. Basilar artery widely patent to its distal aspect without stenosis. Superior cerebral arteries patent bilaterally. Both of the posterior cerebral artery is supplied via the basilar artery. PCAs patent to their distal aspects without proximal stenosis or occlusion. No aneurysm or vascular malformation. MRA NECK FINDINGS Examination limited due to lack of IV contrast. Aortic arch and origin of great vessels not evaluated on this exam. Visualize right common and internal carotid artery's patent within the neck without obvious flow-limiting stenosis no significant atheromatous narrowing about the right carotid bifurcation. Please note that the proximal right common carotid artery not well evaluated. Visualized left common and internal carotid artery's patent within the neck without obvious flow-limiting stenosis. No significant atheromatous narrowing about the left carotid bifurcation. Please note that the proximal left common carotid artery not well evaluated. Proximal/origin of the vertebral arteries  not evaluated. Right vertebral artery patent with antegrade flow without significant stenosis. Focal irregularity with moderate to severe stenosis present within the mid left V2 segment (series 111, image 10). Left vertebral artery otherwise patent to the skullbase. IMPRESSION: MRI HEAD IMPRESSION: 1. 1 cm acute ischemic nonhemorrhagic infarct involving the mesial right temporal lobe/posterior right hippocampal formation. No associated mass effect. 2. Additional probable late subacute to chronic infarct within the adjacent right occipital lobe (right PCA territory), not acute in appearance, but new relative to most recent MRI. 3. 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 4. Normal expected interval evolution of chronic hemorrhagic left PCA territory infarct. 5. Generalized age-related cerebral atrophy with advanced chronic microvascular ischemic disease. MRA HEAD IMPRESSION: Negative intracranial MRA. No large vessel occlusion. No high-grade or correctable stenosis. MRA NECK IMPRESSION: 1. Limited study due to lack of IV contrast. 2. Patent carotid artery systems bilaterally without obvious flow-limiting or critical stenosis. 3. Short-segment moderate  to severe mid left V2 stenosis. Vertebral arteries otherwise widely patent within the neck. Electronically Signed   By: Rise Mu M.D.   On: 02/10/2017 18:58   Mr Brain Wo Contrast  Result Date: 03/06/2017 CLINICAL DATA:  Initial evaluation for acute visual field loss in left eye. History of prior stroke. EXAM: MRI HEAD WITHOUT CONTRAST MRA HEAD WITHOUT CONTRAST MRA NECK WITHOUT CONTRAST TECHNIQUE: Multiplanar, multiecho pulse sequences of the brain and surrounding structures were obtained without intravenous contrast. Angiographic images of the Circle of Willis were obtained using MRA technique without intravenous contrast. Angiographic images of the neck were obtained using MRA  technique without intravenous contrast. Carotid stenosis measurements (when applicable) are obtained utilizing NASCET criteria, using the distal internal carotid diameter as the denominator. CONTRAST:  None. COMPARISON:  Prior CT from earlier the same day as well as previous MRI from 09/05/2016. FINDINGS: MRI HEAD FINDINGS Advance generalized age-related cerebral atrophy. Patchy and confluent T2/FLAIR hyperintensity within the periventricular and deep white matter both cerebral hemispheres, most consistent with chronic microvascular ischemic disease, also fairly advanced in nature. Overall, changes are similar to previous. Normal expected interval evolution of chronic hemorrhagic left PCA territory infarct. There is a chronic anterior right MCA territory infarct involving the right frontal operculum (series 7, image 16). While this is chronic in appearance, this is new relative to previous MRI from 09/05/2016. Scattered associated chronic blood products an laminar necrosis about this area of chronic infarction. Approximate 1 cm focus of restricted diffusion within the posterior aspect of the mesial right temporal lobe, posterior right hippocampal formation (series 100, image 26), consistent with acute ischemic infarct. No associated mass effect or hemorrhage. Linear susceptibility artifact with mild diffusion abnormality within the adjacent mesial right occipital lobe, suspected to reflect sequelae of late subacute to chronic ischemia (series 100, image 28). This is also new from previous. No other evidence for acute or subacute ischemia. Gray-white matter differentiation otherwise maintained. No evidence for acute intracranial hemorrhage. No mass lesion, midline shift or mass effect. Mild ventricular prominence related to global parenchymal volume loss without hydrocephalus. No extra-axial fluid collection. Major dural sinuses grossly patent. Pituitary gland within normal limits. Major intracranial vascular flow  voids maintained. Degenerative thickening of the tectorial membrane noted. Craniocervical junction otherwise unremarkable without significant stenosis. Visualized upper cervical spine unremarkable. Bone marrow signal intensity within normal limits. No scalp soft tissue abnormality. Globes and orbital soft tissues within normal limits. Patient status post lens extraction bilaterally. Paranasal sinuses are clear. No mastoid effusion. Inner ear structures normal. MRA HEAD FINDINGS ANTERIOR CIRCULATION: Distal cervical segments of the internal carotid arteries are patent with antegrade flow. Petrous segments patent without flow-limiting stenosis. Multifocal atheromatous irregularity within the cavernous ICAs without high-grade stenosis. Supraclinoid segments patent. ICA termini widely patent. A1 segments patent bilaterally. Anterior communicating artery normal. Anterior cerebral artery is widely patent to their distal aspects without flow-limiting stenosis. M1 segments demonstrate multifocal atheromatous irregularity but are widely patent without flow-limiting stenosis or occlusion. MCA bifurcations normal. No proximal M2 occlusion. Distal MCA branches well opacified and symmetric. POSTERIOR CIRCULATION: Vertebral artery's patent to the vertebrobasilar junction without flow-limiting stenosis. Posterior inferior cerebral artery is partially visualized but patent bilaterally. Basilar artery widely patent to its distal aspect without stenosis. Superior cerebral arteries patent bilaterally. Both of the posterior cerebral artery is supplied via the basilar artery. PCAs patent to their distal aspects without proximal stenosis or occlusion. No aneurysm or vascular malformation. MRA NECK FINDINGS Examination limited due to lack  of IV contrast. Aortic arch and origin of great vessels not evaluated on this exam. Visualize right common and internal carotid artery's patent within the neck without obvious flow-limiting stenosis no  significant atheromatous narrowing about the right carotid bifurcation. Please note that the proximal right common carotid artery not well evaluated. Visualized left common and internal carotid artery's patent within the neck without obvious flow-limiting stenosis. No significant atheromatous narrowing about the left carotid bifurcation. Please note that the proximal left common carotid artery not well evaluated. Proximal/origin of the vertebral arteries not evaluated. Right vertebral artery patent with antegrade flow without significant stenosis. Focal irregularity with moderate to severe stenosis present within the mid left V2 segment (series 111, image 10). Left vertebral artery otherwise patent to the skullbase. IMPRESSION: MRI HEAD IMPRESSION: 1. 1 cm acute ischemic nonhemorrhagic infarct involving the mesial right temporal lobe/posterior right hippocampal formation. No associated mass effect. 2. Additional probable late subacute to chronic infarct within the adjacent right occipital lobe (right PCA territory), not acute in appearance, but new relative to most recent MRI. 3. 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 4. Normal expected interval evolution of chronic hemorrhagic left PCA territory infarct. 5. Generalized age-related cerebral atrophy with advanced chronic microvascular ischemic disease. MRA HEAD IMPRESSION: Negative intracranial MRA. No large vessel occlusion. No high-grade or correctable stenosis. MRA NECK IMPRESSION: 1. Limited study due to lack of IV contrast. 2. Patent carotid artery systems bilaterally without obvious flow-limiting or critical stenosis. 3. Short-segment moderate to severe mid left V2 stenosis. Vertebral arteries otherwise widely patent within the neck. Electronically Signed   By: Rise Mu M.D.   On: 02/26/2017 18:58   Ct Head Code Stroke Wo Contrast`  Result Date: 03/10/2017 CLINICAL DATA:   Code stroke.  Vision loss and neck pain. EXAM: CT HEAD WITHOUT CONTRAST TECHNIQUE: Contiguous axial images were obtained from the base of the skull through the vertex without intravenous contrast. COMPARISON:  02/14/2017 FINDINGS: Brain: Moderate remote right frontal infarct affecting the operculum and anterior insula. Remote moderate left parasagittal occipital infarct. Patchy chronic microvascular ischemic gliosis in the cerebral white matter. Age congruent generalized cerebral volume loss. No hemorrhage, hydrocephalus or masslike finding. Vascular: Atherosclerotic calcification.  No hyperdense vessel. Skull: Negative. Sinuses/Orbits: Incomplete coverage of the orbits without acute finding. Bilateral cataract resection. Other: These results were called by telephone at the time of interpretation on 02/14/2017 at 2:37 pm to Dr. Scotty Court , who verbally acknowledged these results. ASPECTS [facility] Stroke Program Early CT Score) Not scored with this symptomatology IMPRESSION: 1. No acute finding or change from prior. 2. 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 Electronically Signed   By: Marnee Spring M.D.   On: 02/26/2017 14:42   ASSESSMENT AND PLAN:  This is a 83 y.o. female with a history of afib not on AC, COPD, GERD, HLD, HTN hypothyroidism, pulmonary fibrosis, SIADH, TIA admitted with:  1. Acute CVA -  - MRI of the brain shows a new infarct of mesial right temporal lobe that appears embolic. Also with late subacute of right occipital lobe new since last MRI, chronic hemorrhage also new relative to last MRI - Appreciate Neuro input, likely not a candidate for anticoagulation. D/w family at bedside and they are in agreement for not starting blood thinners - continue aspirin 81mg . Lipitor - PT/ST recommends STR/SNF  2. History of HTN - Hold  Lasix, spironolactone for now. - permissive htn  3. History of COPD Continue Flovent, Spiriva  4. History of HLD Continue  Lipitor  5. History of hypothyroidism Continue Synthroid  6. History of afib Hold Cardizem for now  7. History of GERD Continue Protonix (for Pepcid)  8. Acute diastolic CHF: Mild fluid overload: seen on cxr - one time dose of lasix   Family deciding on STR/SNF vs home with home health.  C/s SW - d/w Fredric Mare    All the records are reviewed and case discussed with Care Management/Social Worker. Management plans discussed with the patient, family (daughter and son in law at bedside) and they are in agreement.  CODE STATUS: DNR  TOTAL TIME TAKING CARE OF THIS PATIENT: 35 minutes.   More than 50% of the time was spent in counseling/coordination of care: YES  POSSIBLE D/C IN 3 DAYS, DEPENDING ON CLINICAL CONDITION. WILL Need 3 MN stay   Delfino Lovett M.D on 03/13/2017 at 1:20 PM  Between 7am to 6pm - Pager - [phone]  After 6pm go to [email or web address] - Social research officer, government  [facility]  Office  [phone]  CC: Primary care physician; Rafael Bihari, MD  Note: This dictation was prepared with Dragon dictation along with smaller phrase technology. Any transcriptional errors that result from this process are unintentional.

## 2017-03-13 NOTE — Consult Note (Signed)
Referring Physician: Scotty Court    Chief Complaint: Loss of vision  HPI: Alyssa Landry is an 83 y.o. female who was eating lunch with her daughter.  At the start of lunch patient as at her baseline.  Later during the meal the patient began to complain of right Landry pain.  This is not unusual for her.  Her daughter then placed something in front of her mother for he to take and she could not find it.  Patient then reported that she was unable to see.  Patient was brought in for evaluation at that time.  Initial NIHSS of 2.    Date last known well: Date: 02/27/2017 Time last known well: Time: 13:00 tPA Given: No: Patient with a history of ICH  Past Medical History:  Diagnosis Date  . A-fib (HCC)   . Acid reflux   . Anemia   . Anxiety   . Arthritis   . Asthma   . Chronic hyponatremia   . COPD (chronic obstructive pulmonary disease) (HCC)   . GERD (gastroesophageal reflux disease)   . Hiatal hernia   . Hiatal hernia   . Hyperlipidemia   . Hypertension   . Hypothyroidism   . Incontinence   . Migraine   . Osteoporosis, post-menopausal   . Persistent cough   . Pulmonary fibrosis (HCC)   . SIADH (syndrome of inappropriate ADH production) (HCC)   . TIA (transient ischemic attack)     Past Surgical History:  Procedure Laterality Date  . ABDOMINAL HYSTERECTOMY  1966  . EYE SURGERY Bilateral    cataracts    Family History  Problem Relation Age of Onset  . Leukemia Mother   . Heart disease Father   . Hypertension Unknown    Social History:  reports that she has never smoked. She has never used smokeless tobacco. She reports that she does not drink alcohol or use drugs.  Allergies:  Allergies  Allergen Reactions  . Aspirin Other (See Comments)    Upset stomach with high doses  . Codeine Other (See Comments)    Reaction:  Unknown   . Ditropan [Oxybutynin] Other (See Comments)    Reaction:  Unknown   . Hctz [Hydrochlorothiazide] Other (See Comments)    Reaction:  Causes  pts sodium/potassium levels to drop significantly   . Metronidazole Other (See Comments)    Reaction:  Unknown   . Propranolol Other (See Comments)    Reaction:  Unknown   . Tavist [Clemastine] Other (See Comments)    Reaction:  Unknown   . Tramadol Other (See Comments)    Reaction:  Unknown   . Vicodin [Hydrocodone-Acetaminophen] Other (See Comments)    Reaction:  Unknown     Medications: I have reviewed the patient's current medications. Prior to Admission:  Prior to Admission medications   Medication Sig Start Date End Date Taking? Authorizing Provider  acetaminophen (TYLENOL) 650 MG CR tablet Take 650 mg by mouth every 8 (eight) hours as needed for pain.   Yes [provider]  albuterol (PROVENTIL HFA;VENTOLIN HFA) 108 (90 BASE) MCG/ACT inhaler Inhale 1-2 puffs into the lungs every 4 (four) hours as needed for wheezing or shortness of breath.   Yes [provider]  aspirin EC 81 MG EC tablet Take 1 tablet (81 mg total) by mouth daily. 09/06/16  Yes Milagros Loll, MD  atorvastatin (LIPITOR) 40 MG tablet Take 1 tablet (40 mg total) by mouth daily at 6 PM. 08/22/16  Yes Enid Baas, MD  cholecalciferol (VITAMIN D) 1000 UNITS tablet Take 1,000 Units by mouth daily.   Yes [provider]  cloNIDine (CATAPRES) 0.1 MG tablet Take 1 tablet (0.1 mg total) by mouth 2 (two) times daily. 01/30/17  Yes Enedina Finner, MD  diltiazem (CARDIZEM CD) 120 MG 24 hr capsule Take 1 capsule (120 mg total) by mouth daily. 12/15/15  Yes Hower, Cletis [place], MD  docusate sodium (COLACE) 100 MG capsule Take 100 mg by mouth 2 (two) times daily.    Yes [provider]  feeding supplement, ENSURE ENLIVE, (ENSURE ENLIVE) LIQD Take 237 mLs by mouth daily.    Yes [provider]  ferrous sulfate 325 (65 FE) MG tablet Take 325 mg by mouth daily.    Yes [provider]  fluticasone (FLONASE) 50 MCG/ACT nasal spray Place 2 sprays into both nostrils daily.   Yes [provider]  fluticasone (FLOVENT HFA) 110 MCG/ACT inhaler Inhale 2 puffs into the lungs 2 (two) times daily. 11/13/16 11/13/17 Yes [provider]  furosemide (LASIX) 20 MG tablet Take 1 tablet (20 mg total) by mouth every other day. 01/31/17  Yes Enedina Finner, MD  levothyroxine (SYNTHROID, LEVOTHROID) 125 MCG tablet Take 125 mcg by mouth daily before breakfast.   Yes [provider]  LORazepam (ATIVAN) 0.5 MG tablet Take 1 tablet (0.5 mg total) by mouth 3 (three) times daily. 10/12/16  Yes Enid Baas, MD  multivitamin-lutein [facility]) CAPS capsule Take 2 capsules by mouth daily.   Yes [provider]  Oxybutynin Chloride (GELNIQUE) 10 % GEL Place 1 application onto the skin daily. 01/27/17  Yes McGowan, Carollee Herter A, PA-C  polyethylene glycol (MIRALAX / GLYCOLAX) packet Take 8.5-17 g by mouth daily. *Mix in 4-8 ounces of fluid prior to taking*   Yes [provider]  ranitidine (ZANTAC) 150 MG tablet Take 150 mg by mouth 2 (two) times daily before a meal.    Yes [provider]  sodium bicarbonate 650 MG tablet Take 325 mg by mouth 4 (four) times daily.   Yes [provider]  tiotropium (SPIRIVA) 18 MCG inhalation capsule Place 18 mcg into inhaler and inhale daily.   Yes [provider]  vitamin B-12 (CYANOCOBALAMIN) 1000 MCG tablet Take 1,000 mcg by mouth daily.   Yes [provider]  budesonide (PULMICORT) 0.5 MG/2ML nebulizer solution Take 2 mLs (0.5 mg total) by nebulization 2 (two) times daily. Patient not taking: Reported on 01/28/2017 10/12/16   Enid Baas, MD  levofloxacin (LEVAQUIN) 250 MG tablet Take 1 tablet (250 mg total) by mouth daily. Patient not taking: Reported on 02/14/2017 01/30/17   Enedina Finner, MD  spironolactone (ALDACTONE) 25 MG tablet Take 25 mg by mouth daily.    [provider]  triamcinolone (KENALOG) 0.025 % ointment Apply 1 application topically 2 (two) times daily. Patient not  taking: Reported on 02/14/2017 09/22/16   Michiel Cowboy A, PA-C     ROS: History obtained from the patient  General ROS: negative for - chills, fatigue, fever, night sweats, weight gain or weight loss Psychological ROS: negative for - behavioral disorder, hallucinations, memory difficulties, mood swings or suicidal ideation Ophthalmic ROS: as noted in HPI ENT ROS: negative for - epistaxis, nasal discharge, oral lesions, sore throat, tinnitus or vertigo Allergy and Immunology ROS: negative for - hives or itchy/watery eyes Hematological and Lymphatic ROS: negative for - bleeding problems, bruising or swollen lymph nodes Endocrine ROS: negative for - galactorrhea, hair pattern changes, polydipsia/polyuria or temperature intolerance  Respiratory ROS: shortness of breath Cardiovascular ROS: negative for - chest pain, dyspnea on exertion, edema or irregular heartbeat Gastrointestinal ROS: negative for - abdominal pain, diarrhea, hematemesis, nausea/vomiting or stool incontinence Genito-Urinary ROS: negative for - dysuria, hematuria, incontinence or urinary frequency/urgency Musculoskeletal ROS: Landry pain Neurological ROS: as noted in HPI Dermatological ROS: negative for rash and skin lesion changes  Physical Examination: Blood pressure (!) 143/63, pulse 88, temperature 98.2 F (36.8 C), resp. rate 18, height 4\' 10"  (1.473 m), weight 109 lb 12.8 oz (49.8 kg), SpO2 97 %.  HEENT-  Normocephalic, no lesions, without obvious abnormality.  Normal external eye and conjunctiva.  Normal TM's bilaterally.  Normal auditory canals and external ears. Normal external nose, mucus membranes and septum.  Normal pharynx. Cardiovascular- S1, S2 normal, pulses palpable throughout   Lungs- chest clear, no wheezing, rales, normal symmetric air entry Abdomen- soft, non-tender; bowel sounds normal; no masses,  no organomegaly Extremities- no edema Lymph-no adenopathy palpable Musculoskeletal-no joint tenderness,  deformity or swelling Skin-multiple areas of bruising noted  Neurological Examination   Mental Status: Alert, oriented, thought content appropriate.  Speech fluent without evidence of aphasia.  Able to follow 3 step commands without difficulty. Cranial Nerves: II: Discs flat bilaterally; Some peripheral vision loss in the right eye.   Unable to see from the left eye, pupils equal, round, reactive to light and accommodation III,IV, VI: ptosis not present, extra-ocular motions intact bilaterally V,VII: smile symmetric, facial light touch sensation normal bilaterally VIII: hearing normal bilaterally IX,X: gag reflex present XI: bilateral shoulder shrug XII: midline tongue extension Motor: Right : Upper extremity   5/5    Left:     Upper extremity   5/5  Lower extremity   5-/5     Lower extremity   5-/5 Tone and bulk:normal tone throughout; no atrophy noted Sensory: Pinprick and light touch intact throughout, bilaterally Deep Tendon Reflexes: 2+ in the upper extremities, trace at the knees and absent AJ's bilaterally Right: downgoing   Left: upgoing  Cerebellar: Normal finger-to-nose and normal heel-to-shin testing bilaterally Gait: not tested due to safety concerns    Laboratory Studies:  Basic Metabolic Panel:  Recent Labs Lab 03/13/17 0025  CREATININE 0.55    Liver Function Tests: No results for input(s): AST, ALT, ALKPHOS, BILITOT, PROT, ALBUMIN in the last 168 hours. No results for input(s): LIPASE, AMYLASE in the last 168 hours. No results for input(s): AMMONIA in the last 168 hours.  CBC:  Recent Labs Lab 03/13/17 0025  WBC 7.2  HGB 10.3*  HCT 30.8*  MCV 86.3  PLT 230    Cardiac Enzymes:  Recent Labs Lab 03/13/17 0025 03/13/17 0613  TROPONINI 0.03* 0.03*    BNP: Invalid input(s): POCBNP  CBG: No results for input(s): GLUCAP in the last 168 hours.  Microbiology: Results for orders placed or performed during the hospital encounter of 03/05/2017   MRSA PCR Screening     Status: Abnormal   Collection Time: 03/13/17  6:31 AM  Result Value Ref Range Status   MRSA by PCR POSITIVE (A) NEGATIVE Final    Comment:        The GeneXpert MRSA Assay (FDA approved for NASAL specimens only), is one component of a comprehensive MRSA colonization surveillance program. It is not intended to diagnose MRSA infection nor to guide or monitor treatment for MRSA infections. RESULT CALLED TO, READ BACK BY AND VERIFIED WITH: Zettie Cooley @ [id number] 03/13/17 by [facility]     Coagulation Studies: No results  for input(s): LABPROT, INR in the last 72 hours.  Urinalysis: No results for input(s): COLORURINE, LABSPEC, PHURINE, GLUCOSEU, HGBUR, BILIRUBINUR, KETONESUR, PROTEINUR, UROBILINOGEN, NITRITE, LEUKOCYTESUR in the last 168 hours.  Invalid input(s): APPERANCEUR  Lipid Panel:    Component Value Date/Time   CHOL 87 03/13/2017 0613   CHOL 220 (H) 08/10/2011 0619   TRIG 21 03/13/2017 0613   TRIG 73 08/10/2011 0619   HDL 46 03/13/2017 0613   HDL 106 (H) 08/10/2011 0619   CHOLHDL 1.9 03/13/2017 0613   VLDL 4 03/13/2017 0613   VLDL 15 08/10/2011 0619   LDLCALC 37 03/13/2017 0613   LDLCALC 99 08/10/2011 0619    HgbA1C:  Lab Results  Component Value Date   HGBA1C 5.3 09/05/2016    Urine Drug Screen:  No results found for: LABOPIA, COCAINSCRNUR, LABBENZ, AMPHETMU, THCU, LABBARB  Alcohol Level: No results for input(s): ETH in the last 168 hours.   Imaging: Dg Chest 1 View  Result Date: 03/13/2017 CLINICAL DATA:  Asthma exacerbation with shortness of breath that worsened after her breathing treatments. EXAM: Portable CHEST 1 VIEW COMPARISON:  02/14/2017, 01/28/2017 and earlier, including CT chest 02/21/2016. FINDINGS: Cardiac silhouette moderately enlarged, unchanged. Thoracic aorta atherosclerotic, unchanged. Hilar and mediastinal contours otherwise unremarkable. Chronic moderately large left pleural effusion, unchanged since the x-ray 3 weeks ago,  slightly increased in size since March, 2018. Associated consolidation in the left lower lobe. Pulmonary venous hypertension and mild interstitial pulmonary edema, new since the most recent prior examination. Small right pleural effusion suspected. Fluid in the major and minor fissure on the right as noted previously. IMPRESSION: 1. Mild CHF suspected, with stable moderate cardiac enlargement and mild interstitial pulmonary edema. 2. Stable moderately large chronic left pleural effusion and associated passive atelectasis in the left lower lobe. 3.  Aortic Atherosclerosis (ICD10-170.0) Electronically Signed   By: Hulan Saas M.D.   On: 03/13/2017 10:27   Mr Angiogram Head Wo Contrast  Result Date: 04-01-17 CLINICAL DATA:  Initial evaluation for acute visual field loss in left eye. History of prior stroke. EXAM: MRI HEAD WITHOUT CONTRAST MRA HEAD WITHOUT CONTRAST MRA Landry WITHOUT CONTRAST TECHNIQUE: Multiplanar, multiecho pulse sequences of the brain and surrounding structures were obtained without intravenous contrast. Angiographic images of the Circle of Willis were obtained using MRA technique without intravenous contrast. Angiographic images of the Landry were obtained using MRA technique without intravenous contrast. Carotid stenosis measurements (when applicable) are obtained utilizing NASCET criteria, using the distal internal carotid diameter as the denominator. CONTRAST:  None. COMPARISON:  Prior CT from earlier the same day as well as previous MRI from 09/05/2016. FINDINGS: MRI HEAD FINDINGS Advance generalized age-related cerebral atrophy. Patchy and confluent T2/FLAIR hyperintensity within the periventricular and deep white matter both cerebral hemispheres, most consistent with chronic microvascular ischemic disease, also fairly advanced in nature. Overall, changes are similar to previous. Normal expected interval evolution of chronic hemorrhagic left PCA territory infarct. There is a chronic  anterior right MCA territory infarct involving the right frontal operculum (series 7, image 16). While this is chronic in appearance, this is new relative to previous MRI from 09/05/2016. Scattered associated chronic blood products an laminar necrosis about this area of chronic infarction. Approximate 1 cm focus of restricted diffusion within the posterior aspect of the mesial right temporal lobe, posterior right hippocampal formation (series 100, image 26), consistent with acute ischemic infarct. No associated mass effect or hemorrhage. Linear susceptibility artifact with mild diffusion abnormality within the adjacent mesial right occipital  lobe, suspected to reflect sequelae of late subacute to chronic ischemia (series 100, image 28). This is also new from previous. No other evidence for acute or subacute ischemia. Gray-white matter differentiation otherwise maintained. No evidence for acute intracranial hemorrhage. No mass lesion, midline shift or mass effect. Mild ventricular prominence related to global parenchymal volume loss without hydrocephalus. No extra-axial fluid collection. Major dural sinuses grossly patent. Pituitary gland within normal limits. Major intracranial vascular flow voids maintained. Degenerative thickening of the tectorial membrane noted. Craniocervical junction otherwise unremarkable without significant stenosis. Visualized upper cervical spine unremarkable. Bone marrow signal intensity within normal limits. No scalp soft tissue abnormality. Globes and orbital soft tissues within normal limits. Patient status post lens extraction bilaterally. Paranasal sinuses are clear. No mastoid effusion. Inner ear structures normal. MRA HEAD FINDINGS ANTERIOR CIRCULATION: Distal cervical segments of the internal carotid arteries are patent with antegrade flow. Petrous segments patent without flow-limiting stenosis. Multifocal atheromatous irregularity within the cavernous ICAs without high-grade  stenosis. Supraclinoid segments patent. ICA termini widely patent. A1 segments patent bilaterally. Anterior communicating artery normal. Anterior cerebral artery is widely patent to their distal aspects without flow-limiting stenosis. M1 segments demonstrate multifocal atheromatous irregularity but are widely patent without flow-limiting stenosis or occlusion. MCA bifurcations normal. No proximal M2 occlusion. Distal MCA branches well opacified and symmetric. POSTERIOR CIRCULATION: Vertebral artery's patent to the vertebrobasilar junction without flow-limiting stenosis. Posterior inferior cerebral artery is partially visualized but patent bilaterally. Basilar artery widely patent to its distal aspect without stenosis. Superior cerebral arteries patent bilaterally. Both of the posterior cerebral artery is supplied via the basilar artery. PCAs patent to their distal aspects without proximal stenosis or occlusion. No aneurysm or vascular malformation. MRA Landry FINDINGS Examination limited due to lack of IV contrast. Aortic arch and origin of great vessels not evaluated on this exam. Visualize right common and internal carotid artery's patent within the Landry without obvious flow-limiting stenosis no significant atheromatous narrowing about the right carotid bifurcation. Please note that the proximal right common carotid artery not well evaluated. Visualized left common and internal carotid artery's patent within the Landry without obvious flow-limiting stenosis. No significant atheromatous narrowing about the left carotid bifurcation. Please note that the proximal left common carotid artery not well evaluated. Proximal/origin of the vertebral arteries not evaluated. Right vertebral artery patent with antegrade flow without significant stenosis. Focal irregularity with moderate to severe stenosis present within the mid left V2 segment (series 111, image 10). Left vertebral artery otherwise patent to the skullbase.  IMPRESSION: MRI HEAD IMPRESSION: 1. 1 cm acute ischemic nonhemorrhagic infarct involving the mesial right temporal lobe/posterior right hippocampal formation. No associated mass effect. 2. Additional probable late subacute to chronic infarct within the adjacent right occipital lobe (right PCA territory), not acute in appearance, but new relative to most recent MRI. 3. 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 4. Normal expected interval evolution of chronic hemorrhagic left PCA territory infarct. 5. Generalized age-related cerebral atrophy with advanced chronic microvascular ischemic disease. MRA HEAD IMPRESSION: Negative intracranial MRA. No large vessel occlusion. No high-grade or correctable stenosis. MRA Landry IMPRESSION: 1. Limited study due to lack of IV contrast. 2. Patent carotid artery systems bilaterally without obvious flow-limiting or critical stenosis. 3. Short-segment moderate to severe mid left V2 stenosis. Vertebral arteries otherwise widely patent within the Landry. Electronically Signed   By: Rise Mu M.D.   On: March 27, 2017 18:58   Mr Alyssa Landry Wo Contrast  Result Date: 03/09/2017 CLINICAL DATA:  Initial evaluation for acute visual field loss in left eye. History of prior stroke. EXAM: MRI HEAD WITHOUT CONTRAST MRA HEAD WITHOUT CONTRAST MRA Landry WITHOUT CONTRAST TECHNIQUE: Multiplanar, multiecho pulse sequences of the brain and surrounding structures were obtained without intravenous contrast. Angiographic images of the Circle of Willis were obtained using MRA technique without intravenous contrast. Angiographic images of the Landry were obtained using MRA technique without intravenous contrast. Carotid stenosis measurements (when applicable) are obtained utilizing NASCET criteria, using the distal internal carotid diameter as the denominator. CONTRAST:  None. COMPARISON:  Prior CT from earlier the same day as well as  previous MRI from 09/05/2016. FINDINGS: MRI HEAD FINDINGS Advance generalized age-related cerebral atrophy. Patchy and confluent T2/FLAIR hyperintensity within the periventricular and deep white matter both cerebral hemispheres, most consistent with chronic microvascular ischemic disease, also fairly advanced in nature. Overall, changes are similar to previous. Normal expected interval evolution of chronic hemorrhagic left PCA territory infarct. There is a chronic anterior right MCA territory infarct involving the right frontal operculum (series 7, image 16). While this is chronic in appearance, this is new relative to previous MRI from 09/05/2016. Scattered associated chronic blood products an laminar necrosis about this area of chronic infarction. Approximate 1 cm focus of restricted diffusion within the posterior aspect of the mesial right temporal lobe, posterior right hippocampal formation (series 100, image 26), consistent with acute ischemic infarct. No associated mass effect or hemorrhage. Linear susceptibility artifact with mild diffusion abnormality within the adjacent mesial right occipital lobe, suspected to reflect sequelae of late subacute to chronic ischemia (series 100, image 28). This is also new from previous. No other evidence for acute or subacute ischemia. Gray-white matter differentiation otherwise maintained. No evidence for acute intracranial hemorrhage. No mass lesion, midline shift or mass effect. Mild ventricular prominence related to global parenchymal volume loss without hydrocephalus. No extra-axial fluid collection. Major dural sinuses grossly patent. Pituitary gland within normal limits. Major intracranial vascular flow voids maintained. Degenerative thickening of the tectorial membrane noted. Craniocervical junction otherwise unremarkable without significant stenosis. Visualized upper cervical spine unremarkable. Bone marrow signal intensity within normal limits. No scalp soft tissue  abnormality. Globes and orbital soft tissues within normal limits. Patient status post lens extraction bilaterally. Paranasal sinuses are clear. No mastoid effusion. Inner ear structures normal. MRA HEAD FINDINGS ANTERIOR CIRCULATION: Distal cervical segments of the internal carotid arteries are patent with antegrade flow. Petrous segments patent without flow-limiting stenosis. Multifocal atheromatous irregularity within the cavernous ICAs without high-grade stenosis. Supraclinoid segments patent. ICA termini widely patent. A1 segments patent bilaterally. Anterior communicating artery normal. Anterior cerebral artery is widely patent to their distal aspects without flow-limiting stenosis. M1 segments demonstrate multifocal atheromatous irregularity but are widely patent without flow-limiting stenosis or occlusion. MCA bifurcations normal. No proximal M2 occlusion. Distal MCA branches well opacified and symmetric. POSTERIOR CIRCULATION: Vertebral artery's patent to the vertebrobasilar junction without flow-limiting stenosis. Posterior inferior cerebral artery is partially visualized but patent bilaterally. Basilar artery widely patent to its distal aspect without stenosis. Superior cerebral arteries patent bilaterally. Both of the posterior cerebral artery is supplied via the basilar artery. PCAs patent to their distal aspects without proximal stenosis or occlusion. No aneurysm or vascular malformation. MRA Landry FINDINGS Examination limited due to lack of IV contrast. Aortic arch and origin of great vessels not evaluated on this exam. Visualize right common and internal carotid artery's patent within the Landry without obvious flow-limiting stenosis no significant atheromatous narrowing about  the right carotid bifurcation. Please note that the proximal right common carotid artery not well evaluated. Visualized left common and internal carotid artery's patent within the Landry without obvious flow-limiting stenosis. No  significant atheromatous narrowing about the left carotid bifurcation. Please note that the proximal left common carotid artery not well evaluated. Proximal/origin of the vertebral arteries not evaluated. Right vertebral artery patent with antegrade flow without significant stenosis. Focal irregularity with moderate to severe stenosis present within the mid left V2 segment (series 111, image 10). Left vertebral artery otherwise patent to the skullbase. IMPRESSION: MRI HEAD IMPRESSION: 1. 1 cm acute ischemic nonhemorrhagic infarct involving the mesial right temporal lobe/posterior right hippocampal formation. No associated mass effect. 2. Additional probable late subacute to chronic infarct within the adjacent right occipital lobe (right PCA territory), not acute in appearance, but new relative to most recent MRI. 3. 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 4. Normal expected interval evolution of chronic hemorrhagic left PCA territory infarct. 5. Generalized age-related cerebral atrophy with advanced chronic microvascular ischemic disease. MRA HEAD IMPRESSION: Negative intracranial MRA. No large vessel occlusion. No high-grade or correctable stenosis. MRA [place] IMPRESSION: 1. Limited study due to lack of IV contrast. 2. Patent carotid artery systems bilaterally without obvious flow-limiting or critical stenosis. 3. Short-segment moderate to severe mid left V2 stenosis. Vertebral arteries otherwise widely patent within the Landry. Electronically Signed   By: Rise Mu M.D.   On: 03/08/2017 18:58   Mr Brain Wo Contrast  Result Date: 02/19/2017 CLINICAL DATA:  Initial evaluation for acute visual field loss in left eye. History of prior stroke. EXAM: MRI HEAD WITHOUT CONTRAST MRA HEAD WITHOUT CONTRAST MRA [place] WITHOUT CONTRAST TECHNIQUE: Multiplanar, multiecho pulse sequences of the brain and surrounding structures were obtained without  intravenous contrast. Angiographic images of the Circle of Willis were obtained using MRA technique without intravenous contrast. Angiographic images of the Landry were obtained using MRA technique without intravenous contrast. Carotid stenosis measurements (when applicable) are obtained utilizing NASCET criteria, using the distal internal carotid diameter as the denominator. CONTRAST:  None. COMPARISON:  Prior CT from earlier the same day as well as previous MRI from 09/05/2016. FINDINGS: MRI HEAD FINDINGS Advance generalized age-related cerebral atrophy. Patchy and confluent T2/FLAIR hyperintensity within the periventricular and deep white matter both cerebral hemispheres, most consistent with chronic microvascular ischemic disease, also fairly advanced in nature. Overall, changes are similar to previous. Normal expected interval evolution of chronic hemorrhagic left PCA territory infarct. There is a chronic anterior right MCA territory infarct involving the right frontal operculum (series 7, image 16). While this is chronic in appearance, this is new relative to previous MRI from 09/05/2016. Scattered associated chronic blood products an laminar necrosis about this area of chronic infarction. Approximate 1 cm focus of restricted diffusion within the posterior aspect of the mesial right temporal lobe, posterior right hippocampal formation (series 100, image 26), consistent with acute ischemic infarct. No associated mass effect or hemorrhage. Linear susceptibility artifact with mild diffusion abnormality within the adjacent mesial right occipital lobe, suspected to reflect sequelae of late subacute to chronic ischemia (series 100, image 28). This is also new from previous. No other evidence for acute or subacute ischemia. Gray-white matter differentiation otherwise maintained. No evidence for acute intracranial hemorrhage. No mass lesion, midline shift or mass effect. Mild ventricular prominence related to global  parenchymal volume loss without hydrocephalus. No extra-axial fluid collection. Major dural sinuses grossly patent. Pituitary  gland within normal limits. Major intracranial vascular flow voids maintained. Degenerative thickening of the tectorial membrane noted. Craniocervical junction otherwise unremarkable without significant stenosis. Visualized upper cervical spine unremarkable. Bone marrow signal intensity within normal limits. No scalp soft tissue abnormality. Globes and orbital soft tissues within normal limits. Patient status post lens extraction bilaterally. Paranasal sinuses are clear. No mastoid effusion. Inner ear structures normal. MRA HEAD FINDINGS ANTERIOR CIRCULATION: Distal cervical segments of the internal carotid arteries are patent with antegrade flow. Petrous segments patent without flow-limiting stenosis. Multifocal atheromatous irregularity within the cavernous ICAs without high-grade stenosis. Supraclinoid segments patent. ICA termini widely patent. A1 segments patent bilaterally. Anterior communicating artery normal. Anterior cerebral artery is widely patent to their distal aspects without flow-limiting stenosis. M1 segments demonstrate multifocal atheromatous irregularity but are widely patent without flow-limiting stenosis or occlusion. MCA bifurcations normal. No proximal M2 occlusion. Distal MCA branches well opacified and symmetric. POSTERIOR CIRCULATION: Vertebral artery's patent to the vertebrobasilar junction without flow-limiting stenosis. Posterior inferior cerebral artery is partially visualized but patent bilaterally. Basilar artery widely patent to its distal aspect without stenosis. Superior cerebral arteries patent bilaterally. Both of the posterior cerebral artery is supplied via the basilar artery. PCAs patent to their distal aspects without proximal stenosis or occlusion. No aneurysm or vascular malformation. MRA [place] FINDINGS Examination limited due to lack of IV contrast.  Aortic arch and origin of great vessels not evaluated on this exam. Visualize right common and internal carotid artery's patent within the Landry without obvious flow-limiting stenosis no significant atheromatous narrowing about the right carotid bifurcation. Please note that the proximal right common carotid artery not well evaluated. Visualized left common and internal carotid artery's patent within the Landry without obvious flow-limiting stenosis. No significant atheromatous narrowing about the left carotid bifurcation. Please note that the proximal left common carotid artery not well evaluated. Proximal/origin of the vertebral arteries not evaluated. Right vertebral artery patent with antegrade flow without significant stenosis. Focal irregularity with moderate to severe stenosis present within the mid left V2 segment (series 111, image 10). Left vertebral artery otherwise patent to the skullbase. IMPRESSION: MRI HEAD IMPRESSION: 1. 1 cm acute ischemic nonhemorrhagic infarct involving the mesial right temporal lobe/posterior right hippocampal formation. No associated mass effect. 2. Additional probable late subacute to chronic infarct within the adjacent right occipital lobe (right PCA territory), not acute in appearance, but new relative to most recent MRI. 3. 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 4. Normal expected interval evolution of chronic hemorrhagic left PCA territory infarct. 5. Generalized age-related cerebral atrophy with advanced chronic microvascular ischemic disease. MRA HEAD IMPRESSION: Negative intracranial MRA. No large vessel occlusion. No high-grade or correctable stenosis. MRA [place] IMPRESSION: 1. Limited study due to lack of IV contrast. 2. Patent carotid artery systems bilaterally without obvious flow-limiting or critical stenosis. 3. Short-segment moderate to severe mid left V2 stenosis. Vertebral arteries otherwise  widely patent within the Landry. Electronically Signed   By: Rise Mu M.D.   On: 02/27/2017 18:58   Ct Head Code Stroke Wo Contrast`  Result Date: 02/22/2017 CLINICAL DATA:  Code stroke.  Vision loss and Landry pain. EXAM: CT HEAD WITHOUT CONTRAST TECHNIQUE: Contiguous axial images were obtained from the base of the skull through the vertex without intravenous contrast. COMPARISON:  02/14/2017 FINDINGS: Brain: Moderate remote right frontal infarct affecting the operculum and anterior insula. Remote moderate left parasagittal occipital infarct. Patchy chronic microvascular ischemic gliosis in the cerebral  white matter. Age congruent generalized cerebral volume loss. No hemorrhage, hydrocephalus or masslike finding. Vascular: Atherosclerotic calcification.  No hyperdense vessel. Skull: Negative. Sinuses/Orbits: Incomplete coverage of the orbits without acute finding. Bilateral cataract resection. Other: These results were called by telephone at the time of interpretation on 03/07/2017 at 2:37 pm to Dr. Scotty Court , who verbally acknowledged these results. ASPECTS [facility] Stroke Program Early CT Score) Not scored with this symptomatology IMPRESSION: 1. No acute finding or change from prior. 2. 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 Electronically Signed   By: Marnee Spring M.D.   On: 02/14/2017 14:42    Assessment: 83 y.o. female presenting with loss of vision from the left eye.  Suspect embolic ischemic event.  Head CT reviewed and shows no acute changes.  Chronic infarcts noted.  Patient on ASA at home.  Not an anticoagulation candidate despite atrial fibrillation due to history of hemorrhage.  Recent work up in February was unremarkable.      Stroke Risk Factors - atrial fibrillation, hyperlipidemia and hypertension  Plan: 1. HgbA1c, fasting lipid panel normal 2. MRI of the brain shows a new infarct of mesial right temporal lobe that appears embolic. Also with  late subacute of right occipital lobe new since last MRI, chronic hemorrhage also new relative to last MRI 3. PT consult, OT consult, Speech consult. Will need SNF 4. Echocardiogram pending 5. Prophylactic therapy-Continue ASA 6. NPO until RN stroke swallow screen 7. Telemetry monitoring 9. Frequent neuro checks  Case discussed with Dr. Sherryll Burger, neurology will sign off at this time.   Naomie Dean, MD Neurology [phone] 03/13/2017, 12:07 PM

## 2017-03-13 NOTE — Evaluation (Signed)
Clinical/Bedside Swallow Evaluation Patient Details  Name: Alyssa Landry Spells MRN: [id number] Date of Birth: Mar 24, 1919  Today's Date: 03/13/2017 Time: SLP Start Time (ACUTE ONLY): 1115 SLP Stop Time (ACUTE ONLY): 1215 SLP Time Calculation (min) (ACUTE ONLY): 60 min  Past Medical History:  Past Medical History:  Diagnosis Date  . A-fib (HCC)   . Acid reflux   . Anemia   . Anxiety   . Arthritis   . Asthma   . Chronic hyponatremia   . COPD (chronic obstructive pulmonary disease) (HCC)   . GERD (gastroesophageal reflux disease)   . Hiatal hernia   . Hiatal hernia   . Hyperlipidemia   . Hypertension   . Hypothyroidism   . Incontinence   . Migraine   . Osteoporosis, post-menopausal   . Persistent cough   . Pulmonary fibrosis (HCC)   . SIADH (syndrome of inappropriate ADH production) (HCC)   . TIA (transient ischemic attack)    Past Surgical History:  Past Surgical History:  Procedure Laterality Date  . ABDOMINAL HYSTERECTOMY  1966  . EYE SURGERY Bilateral    cataracts   HPI:  Pt is a 83 y.o. female with a known history of multiple medical issues including GERD, Hiatal Hernia, multiple TIAs/strokes, afib not on AC, COPD, HERD, HLD, HTN hypothyroidism, pulmonary fibrosis, SIADH, TIA was in a usual state of health until 12:30 PM when she was eating lunch with her daughter and she experienced sudden loss of vision in the left eye. Patient and daughter indicate that vision is slowly improving as the day goes on.  Patient has right eye visual field deficit from prior stroke.   Patient has had a prior hemorrhagic stroke which precludes AC for afib. MRI in ED revealed a 1cm acute ischemic infarct of the right temporal lobe/posterior rigth hippocampus. Pt is alert, verbally conversive but hampered by her respiratory status currently. NSG/RT attending to pt's needs d/t CHF and Stable moderately large chronic left pleural effusion impacting her breathing. Family present and denied any difficulty  swallowing; pt denied such as well.    Assessment / Plan / Recommendation Clinical Impression  Pt appeared to tolerate the few trials of liquids she accepted w/ no immediate, overt s/s of aspiration noted. It was only a few po trials total but pt declined further d/t not feeling inclined for po's at the time. No decline in vocal quality occurred and no further decline in respiratory status noted; however, a mild cough to clear phlegm x1 followed po trials x1 - pt stated was not related to swallowing. No further cough occurred w/ consecutive trials. Pt was able to hold the cup to feed self. Speech clear post trials. Oral phase appeared wfl for bolus management. Due to pt's advanced age and presentation of increased respiratory effort w/ exertion and at rest d/t baseline medical issues, recommend a modiifed food consistency for conservation of energy. Recommend strict aspiration precautions; Pills in Puree as pt has been doing w/ NSG. NSG to monitor for any decline in status w/ oral intake. ST services will f/u w/ pt's status and toleration of diet/meals. Of note, pt does have risk for aspiration of REFLUX material secondary to baseline issues of hiatal hernia(if not repaired) and GERD which could also impact pulmonary status. SLP Visit Diagnosis: Dysphagia, pharyngoesophageal phase (R13.14);Dysphagia, unspecified (R13.10)    Aspiration Risk  Mild aspiration risk (d/t baseline respiratory and GI issues)    Diet Recommendation  Dysphagia level 3 w/ Thin liquids; aspiration precautions; REFLUX precautions;  Rest Breaks w/ meals as needed to avoid increased WOB/SOB w/ exertion d/t baseline pulmonary issues(CHF) as per chart notes  Medication Administration: Whole meds with puree (or crushed if needed for easier swallowing)    Other  Recommendations Recommended Consults:  (Dietician f/u) Oral Care Recommendations: Oral care BID;Patient independent with oral care;Staff/trained caregiver to provide oral care    Follow up Recommendations None (TBD)      Frequency and Duration min 2x/week  1 week       Prognosis Prognosis for Safe Diet Advancement: Fair (-Good) Barriers to Reach Goals:  (baseline pulmonary issues)      Swallow Study   General Date of Onset: 02/27/2017 HPI: Pt is a 83 y.o. female with a known history of multiple medical issues including GERD, Hiatal Hernia, multiple TIAs/strokes, afib not on AC, COPD, HERD, HLD, HTN hypothyroidism, pulmonary fibrosis, SIADH, TIA was in a usual state of health until 12:30 PM when she was eating lunch with her daughter and she experienced sudden loss of vision in the left eye. Patient and daughter indicate that vision is slowly improving as the day goes on.  Patient has right eye visual field deficit from prior stroke.   Patient has had a prior hemorrhagic stroke which precludes AC for afib. MRI in ED revealed a 1cm acute ischemic infarct of the right temporal lobe/posterior rigth hippocampus. Pt is alert, verbally conversive but hampered by her respiratory status currently. NSG/RT attending to pt's needs d/t CHF and Stable moderately large chronic left pleural effusion impacting her breathing. Family present and denied any difficulty swallowing; pt denied such as well.  Type of Study: Bedside Swallow Evaluation Previous Swallow Assessment: none noted Diet Prior to this Study: Dysphagia 3 (soft);Thin liquids Temperature Spikes Noted: No (wbc 7.2) Respiratory Status: Nasal cannula (2 liters) History of Recent Intubation: No Behavior/Cognition: Alert;Cooperative;Pleasant mood;Distractible;Requires cueing Oral Cavity Assessment: Dry (min) Oral Care Completed by SLP: Recent completion by staff Oral Cavity - Dentition: Adequate natural dentition Vision: Functional for self-feeding Self-Feeding Abilities: Able to feed self;Needs set up;Needs assist Patient Positioning: Upright in bed Baseline Vocal Quality: Low vocal intensity (min) Volitional Cough:  Strong Volitional Swallow: Able to elicit    Oral/Motor/Sensory Function Overall Oral Motor/Sensory Function: Within functional limits   Ice Chips Ice chips: Within functional limits Presentation: Spoon (fed; 2 trials)   Thin Liquid Thin Liquid: Within functional limits Presentation: Cup;Self Fed (4 trials) Other Comments: pt declined further. noted a mild cough to clear phlegm x1 which pt stated was not related to swallowing. nothing further heard.    Nectar Thick Nectar Thick Liquid: Not tested   Honey Thick Honey Thick Liquid: Not tested   Puree Puree: Not tested   Solid   GO   Solid: Not tested    Functional Assessment Tool Used: clinical judgement Functional Limitations: Swallowing Swallow Current Status (Z6109): At least 1 percent but less than 20 percent impaired, limited or restricted Swallow Goal Status [phone]): At least 1 percent but less than 20 percent impaired, limited or restricted Swallow Discharge Status [phone]): At least 1 percent but less than 20 percent impaired, limited or restricted    Jerilynn Som, MS, CCC-SLP [place] Petta 03/13/2017,1:34 PM

## 2017-03-13 NOTE — Clinical Social Work Note (Signed)
Clinical Social Work Assessment  Patient Details  Name: ZAIDE MCCLENAHAN MRN: [id number] Date of Birth: 05-11-1919  Date of referral:  03/13/17               Reason for consult:  Facility Placement                Permission sought to share information with:  Chartered certified accountant granted to share information::  Yes, Verbal Permission Granted  Name::      [place]::   [place]   Relationship::     Contact Information:     Housing/Transportation Living arrangements for the past 2 months:  [place]ob[place] of Information:  Adult Children Patient Interpreter Needed:  None Criminal Activity/Legal Involvement Pertinent to Current Situation/Hospitalization:  No - Comment as needed Significant Relationships:  Adult Children Lives with:  Adult Children Do you feel safe going back to the place where you live?    Need for family participation in patient care:  Yes (Comment)  Care giving concerns:  Patient lives with her daughter Mardene Celeste home # [phone], cell # [phone] and her son in law Richard in [place].    Facilities manager / plan:  Holiday representative ([place]) received SNF consult. PT is recommending SNF and per MD patient had a stroke and changed to inpatient status today 03/13/17. CSW met with patient and her daughter Mardene Celeste and son in law Delfino Lovett were at bedside. Patient did not participate in assessment. Per daughter she is patient's HPOA and patient is living with her. Per daughter patient has another daughter in [place]. CSW explained SNF process and that medicare requires a 3 night qualifying inpatient stay in order to pay for SNF. Daughter is agreeable to SNF search in [facility] and prefers [facility]. Per daughter patient has been to [facility] 3 times and the last time was in April of 2018. Per daughter she would also like to consider taking [facility] with [facility]. Daughter stated that she is  not sure patient can tolerate intense rehab services at a SNF. CSW provided emotional support. FL2 complete and faxed out. CSW will continue to follow and assist as needed.    Employment status:  Disabled (Comment on whether or not currently receiving Disability) Insurance information:  Medicare PT Recommendations:  [place] / Referral to community resources:  [place]  Patient/Family's Response to care:  Patient's daughter is deciding between SNF and home health.   Patient/Family's Understanding of and Emotional Response to Diagnosis, Current Treatment, and Prognosis:  Patient's daughter was very pleasant and thanked CSW for assistance.   Emotional Assessment Appearance:  Appears stated age Attitude/Demeanor/Rapport:  Unable to Assess Affect (typically observed):  Unable to Assess Orientation:  Oriented to Self, Oriented to  Time, Fluctuating Orientation (Suspected and/or reported Sundowners) Alcohol / Substance use:  Not Applicable Psych involvement (Current and /or in the community):  No (Comment)  Discharge Needs  Concerns to be addressed:  Discharge Planning Concerns Readmission within the last 30 days:  No Current discharge risk:  Dependent with Mobility, Cognitively Impaired, Chronically ill Barriers to Discharge:  Continued Medical Work up   [employer], Veronia Beets, LCSW 03/13/2017, 1:35 PM

## 2017-03-13 NOTE — Plan of Care (Signed)
Problem: Education: Goal: Knowledge of [place] [facility] Education information/materials will improve Outcome: Progressing VSS, free of falls during shift.  Denies pain.  Pt and daughter oriented to unit, aware of POC, neuro checks/VS q2h.  NIH 2 for R vision field impairment.  Neuro checks stable.  Bed in low position, call bell within reach.  [facility]

## 2017-03-13 NOTE — Progress Notes (Signed)
Assessed patient after being called by charge nurse, for patient still having shortness of breath.  Had given prn treatment  earlier due to patient complaint of shortness of breath. Oxygen saturations remain 96-97% HR 91 respirations 22.  Spoke with patient's RN and physician about earlier chest X-ray from the morning and medication changes are being ordered.

## 2017-03-13 NOTE — Progress Notes (Signed)
Anticoagulation monitoring(Lovenox):  83yo  feamle ordered Lovenox 40 mg Q24h for DVT prevention  Filed Weights   10/20/16 1445 10/20/16 2345  Weight: 103 lb (46.7 kg) 109 lb 12.8 oz (49.8 kg)    Lab Results  Component Value Date   CREATININE 0.55 03/13/2017   CREATININE 0.54 02/14/2017   CREATININE 0.51 01/29/2017   Estimated Creatinine Clearance: 28.9 mL/min (by C-G formula based on SCr of 0.55 mg/dL). Hemoglobin & Hematocrit     Component Value Date/Time   HGB 10.3 (L) 03/13/2017 0025   HGB 9.9 (L) 08/12/2012 0526   HCT 30.8 (L) 03/13/2017 0025   HCT 28.4 (L) 08/12/2012 [id number]     Per Protocol for Patient with estCrcl < 30 ml/min and BMI < 40, will transition to Lovenox 30 mg Q24h     Clovia CuffLisa Atticus Wedin, PharmD, BCPS 03/13/2017 1:34 AM

## 2017-03-13 NOTE — Evaluation (Signed)
Physical Therapy Evaluation Patient Details Name: Alyssa Landry MRN: [id number] DOB: [birth year] Today's Date: 03/13/2017   History of Present Illness  presented to ER secondary to acute vision loss/change to L eye; admitted with acute CVA (R temporal/posterior R hippocampal infarct, late subacute R occipital).  Initial NIHSS 2.  Clinical Impression  Upon evaluation, patient alert and oriented to basic information; follows simple commands and demonstrates good effort with all functional tasks.  Bilat UE/LE strength and ROM appears grossly symmetrical and WFL; no focal weakness, sensory deficit appreciated.  Visual field testing: appears to detect stimuli in R visual field approx 10-15 degrees to R of midline, L visual field approx 80 degrees to L of midline.  However, does seem to have significant difficulty visualizing/understanding environment during functional tasks.  Would benefit from more indepth visual testing to fully understand impact on ability to negotiate environments. Completes bed mobility with close sup; sit/stand, basic transfers and gait (15' x2) with RW, cga/min assist for balance, walker management and pathway negotiation.  Notably fatigued, SOB with minimal exertion; unable to tolerate additional activity as result.  Did require increase in supplemental O2 to 3L with exertion to maintain sats >90%. RN informed/aware; returned to 2L end of session. Would benefit from skilled PT to address above deficits and promote optimal return to PLOF; recommend transition to STR upon discharge from acute hospitalization.     Follow Up Recommendations SNF    Equipment Recommendations       Recommendations for Other Services       Precautions / Restrictions Precautions Precautions: Fall Restrictions Weight Bearing Restrictions: No      Mobility  Bed Mobility Overal bed mobility: Needs Assistance Bed Mobility: Supine to Sit     Supine to sit: Supervision         Transfers Overall transfer level: Needs assistance Equipment used: Rolling walker (2 wheeled) Transfers: Sit to/from Stand Sit to Stand: Min guard         General transfer comment: cuing for hand plcement  Ambulation/Gait Ambulation/Gait assistance: Min guard;Min assist Ambulation Distance (Feet): 15 Feet (x2) Assistive device: Rolling walker (2 wheeled)     Gait velocity interpretation: Below normal speed for age/gender General Gait Details: short, choppy steps throughout gait cycle; constant assist for walker management and path negotiation. Notably fatigued, SOB with minimal exertion; unable to tolerate additional activity as result.  Did require increase in supplemental O2 to 3L with exertion to maintain sats >90%. RN informed/aware; returned to 2L end of session.  Stairs            Wheelchair Mobility    Modified Rankin (Stroke Patients Only)       Balance Overall balance assessment: Needs assistance Sitting-balance support: No upper extremity supported;Feet supported Sitting balance-Leahy Scale: Fair     Standing balance support: Bilateral upper extremity supported Standing balance-Leahy Scale: Fair                               Pertinent Vitals/Pain Pain Assessment: No/denies pain    Home Living Family/patient expects to be discharged to:: Private residence Living Arrangements: Children Available Help at Discharge: Family;Available 24 hours/day Type of Home: House Home Access: Stairs to enter Entrance Stairs-Rails: None Entrance Stairs-Number of Steps: 1 (chair lift over primary set of stairs for entry/exit of home, 1 additional step once off lift) Home Layout: One level Home Equipment: Walker - 2 wheels;Grab bars - toilet  Prior Function Level of Independence: Needs assistance   Gait / Transfers Assistance Needed:       Comments: Daughter assists with ADLs as needed; ambulatory with RW for household distances, mod indep.   Denies recent fall history.  Home O2 at 2L      Hand Dominance        Extremity/Trunk Assessment   Upper Extremity Assessment Upper Extremity Assessment: Generalized weakness    Lower Extremity Assessment Lower Extremity Assessment: Generalized weakness (grossly 4-/5 throughout; denies sensory deficit)       Communication   Communication: No difficulties  Cognition Arousal/Alertness: Awake/alert Behavior During Therapy: WFL for tasks assessed/performed Overall Cognitive Status: Within Functional Limits for tasks assessed                                        General Comments      Exercises Other Exercises Other Exercises: Toilet transfer, ambulatory with Rw, min assist; constant cuing/assist for walker management, pathway negotiation and problem-solving of environment.  Cga/min assist for sit/stand from standard toilet height; cga/close sup for standing balance during clothig management; dep for hygiene. Other Exercises: Visual field testing: appears to detect stimuli in R visual field approx 10-15 degrees to R of midline, L visual field approx 80 degrees to L of midline.  However, does seem to have significant difficulty visualizing/understanding environment during functional tasks.  Would benefit from more indepth visual testing.   Assessment/Plan    PT Assessment Patient needs continued PT services  PT Problem List Decreased strength;Decreased activity tolerance;Decreased balance;Decreased mobility;Decreased knowledge of use of DME;Decreased safety awareness;Decreased knowledge of precautions;Cardiopulmonary status limiting activity       PT Treatment Interventions DME instruction;Gait training;Stair training;Functional mobility training;Therapeutic activities;Therapeutic exercise;Balance training;Neuromuscular re-education;Patient/family education    PT Goals (Current goals can be found in the Care Plan section)  Acute Rehab PT Goals Patient Stated  Goal: to get stronger PT Goal Formulation: With patient/family Time For Goal Achievement: 03/27/17 Potential to Achieve Goals: Good    Frequency 7X/week   Barriers to discharge        Co-evaluation               AM-PAC PT "6 Clicks" Daily Activity  Outcome Measure Difficulty turning over in bed (including adjusting bedclothes, sheets and blankets)?: A Little Difficulty moving from lying on back to sitting on the side of the bed? : A Little Difficulty sitting down on and standing up from a chair with arms (e.g., wheelchair, bedside commode, etc,.)?: Unable Help needed moving to and from a bed to chair (including a wheelchair)?: A Little Help needed walking in hospital room?: A Little Help needed climbing 3-5 steps with a railing? : A Little 6 Click Score: 16    End of Session Equipment Utilized During Treatment: Gait belt;Oxygen Activity Tolerance: Patient limited by fatigue Patient left: in bed;with call bell/phone within reach;with bed alarm set;with family/visitor present Nurse Communication: Mobility status PT Visit Diagnosis: Difficulty in walking, not elsewhere classified (R26.2);Muscle weakness (generalized) (M62.81)    Time: [id number] PT Time Calculation (min) (ACUTE ONLY): 33 min   Charges:   PT Evaluation $PT Eval Moderate Complexity: 1 Mod PT Treatments $Therapeutic Activity: 8-22 mins   PT G Codes:   PT G-Codes **NOT FOR INPATIENT CLASS** Functional Assessment Tool Used: AM-PAC 6 Clicks Basic Mobility Functional Limitation: Mobility: Walking and moving around Mobility: Walking and Moving Around  Current Status [phone]): At least 40 percent but less than 60 percent impaired, limited or restricted Mobility: Walking and Moving Around Goal Status [phone]): At least 1 percent but less than 20 percent impaired, limited or restricted    Eddrick Dilone H. Manson Passey, PT, DPT, NCS 03/13/17, 10:34 AM [phone]

## 2017-03-13 NOTE — Clinical Social Work Placement (Signed)
   CLINICAL SOCIAL WORK PLACEMENT  NOTE  Date:  03/13/2017  Patient Details  Name: Alyssa Landry MRN: [id number] Date of Birth: 10/25/1918  Clinical Social Work is seeking post-discharge placement for this patient at the Skilled  Nursing Facility level of care (*CSW will initial, date and re-position this form in  chart as items are completed):  Yes   Patient/family provided with [facility] Clinical Social Work Department's list of facilities offering this level of care within the geographic area requested by the patient (or if unable, by the patient's family).  Yes   Patient/family informed of their freedom to choose among providers that offer the needed level of care, that participate in Medicare, Medicaid or managed care program needed by the patient, have an available bed and are willing to accept the patient.  Yes   Patient/family informed of [facility] ownership interest [facility] and [facility], as well as of the fact that they are under no obligation to receive care at these facilities.  PASRR submitted to EDS on       PASRR number received on       Existing PASRR number confirmed on 03/13/17     FL2 transmitted to all facilities in geographic area requested by pt/family on 03/13/17     FL2 transmitted to all facilities within larger geographic area on       Patient informed that his/her managed care company has contracts with or will negotiate with certain facilities, including the following:            Patient/family informed of bed offers received.  Patient chooses bed at       Physician recommends and patient chooses bed at      Patient to be transferred to   on  .  Patient to be transferred to facility by       Patient family notified on   of transfer.  Name of family member notified:        PHYSICIAN       Additional Comment:    _______________________________________________ Alyssa Landry, Darleen CrockerBailey M, LCSW 03/13/2017, 1:35 PM

## 2017-03-13 DEATH — deceased

## 2017-03-14 LAB — BASIC METABOLIC PANEL
Anion gap: 12 (ref 5–15)
BUN: 15 mg/dL (ref 6–20)
CHLORIDE: 88 mmol/L — AB (ref 101–111)
CO2: 31 mmol/L (ref 22–32)
CREATININE: 0.55 mg/dL (ref 0.44–1.00)
Calcium: 9.8 mg/dL (ref 8.9–10.3)
GFR calc Af Amer: >60 mL/min (ref >60)
GFR calc non Af Amer: >60 mL/min (ref >60)
Glucose, Bld: 140 mg/dL — ABNORMAL HIGH (ref 65–99)
Potassium: 4.1 mmol/L (ref 3.5–5.1)
SODIUM: 131 mmol/L — AB (ref 135–145)

## 2017-03-14 LAB — CBC
HCT: 33.5 % — ABNORMAL LOW (ref 35.0–47.0)
HEMOGLOBIN: 11.5 g/dL — AB (ref 12.0–16.0)
MCH: 29.4 pg (ref 26.0–34.0)
MCHC: 34.4 g/dL (ref 32.0–36.0)
MCV: 85.5 fL (ref 80.0–100.0)
PLATELETS: 269 10*3/uL (ref 150–440)
RBC: 3.91 MIL/uL (ref 3.80–5.20)
RDW: 14.4 % (ref 11.5–14.5)
WBC: 10.9 10*3/uL (ref 3.6–11.0)

## 2017-03-14 MED ORDER — ONDANSETRON HCL 4 MG/2ML IJ SOLN
4.0000 mg | Freq: Four times a day (QID) | INTRAMUSCULAR | Status: DC | PRN
  Administered 2017-03-14 – 2017-03-15 (×3): 4 mg via INTRAVENOUS
  Filled 2017-03-14 (×3): qty 2

## 2017-03-14 MED ORDER — ONDANSETRON HCL 4 MG/2ML IJ SOLN: 4.0000 mg | Freq: Four times a day (QID) | INTRAMUSCULAR | Status: DC

## 2017-03-14 NOTE — Clinical Social Work Note (Signed)
CSW presented bed offers to patient's daughter and they chose [facility].  Patient's family may decide home with home health, daughter will confirm with CSW on Monday what her decision is.  Ervin KnackEric R. Arnol Mcgibbon, MSW, Theresia MajorsLCSWA [phone]  03/14/2017 11:21 AM

## 2017-03-14 NOTE — Progress Notes (Addendum)
OT Cancellation Note  Patient Details Name: Alyssa Landry MRN: [id number] DOB: 1919/05/15   Cancelled Treatment:    Reason Eval/Treat Not Completed: Patient declined, no reason specified. Order received, chart reviewed. Upon attempt, daughter in room helping pt wash her face after attempting to eat a couple bites of breakfast. Pt/daughter report pt has been feeling nauseated and having dry heaves this morning and pt started to again while OT was in the room. Daughter noted, RN aware. Pt/daughter requesting OT come back at later time when pt is feeling better and more able to participate. Will re-attempt at later time as pt is willing/able to participate.   Richrd PrimeJamie Stiller, MPH, MS, OTR/L ascom [phone] 03/14/17, 9:56 AM

## 2017-03-14 NOTE — Progress Notes (Signed)
OT Cancellation Note  Patient Details Name: Alyssa Landry MRN: [id number] DOB: Nov 01, 1918   Cancelled Treatment:    Reason Eval/Treat Not Completed: Fatigue/lethargy limiting ability to participate. Pt too lethargic/fatigued to participate meaningfully in OT evaluation at this time. Limited participation with PT earlier. Will re-attempt OT evaluation next date.  Richrd PrimeJamie Stiller, MPH, MS, OTR/L ascom [phone] 03/14/17, 12:51 PM

## 2017-03-14 NOTE — Plan of Care (Signed)
Problem: Safety: Goal: Ability to remain free from injury will improve Outcome: Progressing Patient remains in bed all night, [facility] with one assist 1 time. Bed alarm on, non skid socks in place. No injuries or fall noted thus far.   Problem: Pain Managment: Goal: General experience of comfort will improve Outcome: Progressing Remained comfortable thus far, no complaints raised. Needs attended.   Problem: Activity: Goal: Risk for activity intolerance will decrease Outcome: Progressing Verbalized that she feels tired and wanted to have a good night sleep. Noticed patient sleeping comfortably most of the night. Needs attended.

## 2017-03-14 NOTE — Progress Notes (Signed)
Physical Therapy Treatment Patient Details Name: TAMETRIA AHO MRN: [id number] DOB: 07/07/1919 Today's Date: 03/14/2017    History of Present Illness presented to ER secondary to acute vision loss/change to L eye; admitted with acute CVA (R temporal/posterior R hippocampal infarct, late subacute R occipital).  Initial NIHSS 2.    PT Comments    Noted decrease in level of alertness and overall participation/interaction this date.  Decreased conversiveness, answering mostly "okay" to therapist questioning.  Does not initiate any progressive mobility without hands-on assist from therapist.  Gross strength, ROM and sensory assessment generally comparable to previous date.  Vitals stable and WFL.  RN informed/aware. Unable to participate with OOB/gait due to lethargy and spontaneous sitting during each standing trial.  Will continue efforts next date as medically appropriate. May benefit from palliative care consult?  Discussed with primary RN.    Follow Up Recommendations  SNF     Equipment Recommendations       Recommendations for Other Services       Precautions / Restrictions Precautions Precautions: Fall Restrictions Weight Bearing Restrictions: No    Mobility  Bed Mobility Overal bed mobility: Needs Assistance Bed Mobility: Supine to Sit;Sit to Supine     Supine to sit: Min assist;Mod assist Sit to supine: Mod assist;Max assist      Transfers Overall transfer level: Needs assistance Equipment used: Rolling walker (2 wheeled) Transfers: Sit to/from Stand Sit to Stand: Min assist         General transfer comment: spontaneously sitting after 3-5 seconds into each trial; unable to progress to gait/mobility  Ambulation/Gait             General Gait Details: unsafe/unable to tolerate   Stairs            Wheelchair Mobility    Modified Rankin (Stroke Patients Only)       Balance Overall balance assessment: Needs assistance Sitting-balance  support: No upper extremity supported;Feet supported Sitting balance-Leahy Scale: Fair     Standing balance support: Bilateral upper extremity supported Standing balance-Leahy Scale: Fair                              Cognition Arousal/Alertness: Lethargic Behavior During Therapy: Flat affect Overall Cognitive Status: Difficult to assess                                 General Comments: less responsive, interactive this date; answering "okay" to most questions without additional prompting; poor task initiation (often requiring assist from therapist to initiate)      Exercises Other Exercises Other Exercises: Max cuing for eyes open, attention to task during session.  Able to name 3/3 items and demonstrate use.  Noted decrease in level of alertness and overall participation/interaction this date.  Decreased conversiveness, answering mostly "okay" to therapist questioning.  Does not initiate any progressive mobiltiy without hands-on assist from therapist.  Gross strength, ROM and sensory assessment generally comparable to previous date.    General Comments        Pertinent Vitals/Pain Pain Assessment: No/denies pain    Home Living                      Prior Function            PT Goals (current goals can now be found in the care plan section) Acute  Rehab PT Goals Patient Stated Goal: to get stronger PT Goal Formulation: With patient/family Time For Goal Achievement: 03/27/17 Potential to Achieve Goals: Fair Progress towards PT goals: Not progressing toward goals - comment (decrease in alertness, participation)    Frequency    7X/week      PT Plan Current plan remains appropriate    Co-evaluation              AM-PAC PT "6 Clicks" Daily Activity  Outcome Measure  Difficulty turning over in bed (including adjusting bedclothes, sheets and blankets)?: Unable Difficulty moving from lying on back to sitting on the side of the  bed? : Unable Difficulty sitting down on and standing up from a chair with arms (e.g., wheelchair, bedside commode, etc,.)?: Unable Help needed moving to and from a bed to chair (including a wheelchair)?: A Little Help needed walking in hospital room?: A Little Help needed climbing 3-5 steps with a railing? : A Lot 6 Click Score: 11    End of Session Equipment Utilized During Treatment: Oxygen Activity Tolerance: Patient limited by fatigue;Patient limited by lethargy Patient left: in bed;with call bell/phone within reach;with bed alarm set;with family/visitor present Nurse Communication: Mobility status PT Visit Diagnosis: Difficulty in walking, not elsewhere classified (R26.2);Muscle weakness (generalized) (M62.81)     Time: 1610-96041143-1200 PT Time Calculation (min) (ACUTE ONLY): 17 min  Charges:  $Therapeutic Activity: 8-22 mins                    G Codes:       Michelangelo Rindfleisch H. Manson PasseyBrown, PT, DPT, NCS 03/14/17, 12:08 PM [phone]

## 2017-03-14 NOTE — Plan of Care (Signed)
Spoke to Dr. Sherryll BurgerShah about requesting Palliative Consult.  Pt has declined in last 24 hours - more lethargic.  Very poor PO intake; SOB at rest, speaking and taking medications.  Dr. Sherryll BurgerShah didn't think Palliative would be here tomorrow.  Plan is for patient to go to rehab and he said Palliative could follow her outpt.  Spoke to daughter, Alyssa Hashimotoatricia about what Palliative involves.  Explained that the goal is to focus on quality of life - like trying to increase her appetite and making the most of the calories she consumes.  Reviewing medications and eliminating non-essential meds since she gets so short of breath.  Told her it adds another layer of care for her mother and she thought it would be a good idea. Dr. Sherryll BurgerShah did say we could put a hold on the vitamins she's taking, since taking medications is getting to be a struggle.

## 2017-03-14 NOTE — Progress Notes (Signed)
[facility] [place] [facility]   PATIENT NAME: Alyssa Landry    MR#:  [id number]  DATE OF BIRTH:  08/10/1918  SUBJECTIVE:  CHIEF COMPLAINT:   Chief Complaint  Patient presents with  . Code Stroke  feels fine. No complaints.  Sleepy REVIEW OF SYSTEMS:  Review of Systems  Constitutional: Negative for chills, fever and weight loss.  HENT: Negative for nosebleeds and sore throat.   Eyes: Negative for blurred vision.  Respiratory: Negative for cough, shortness of breath and wheezing.   Cardiovascular: Negative for chest pain, orthopnea, leg swelling and PND.  Gastrointestinal: Negative for abdominal pain, constipation, diarrhea, heartburn, nausea and vomiting.  Genitourinary: Negative for dysuria and urgency.  Musculoskeletal: Positive for neck pain. Negative for back pain.  Skin: Negative for rash.  Neurological: Negative for dizziness, speech change, focal weakness and headaches.  Endo/Heme/Allergies: Does not bruise/bleed easily.  Psychiatric/Behavioral: Negative for depression.   DRUG ALLERGIES:   Allergies  Allergen Reactions  . Aspirin Other (See Comments)    Upset stomach with high doses  . Codeine Other (See Comments)    Reaction:  Unknown   . Ditropan [Oxybutynin] Other (See Comments)    Reaction:  Unknown   . Hctz [Hydrochlorothiazide] Other (See Comments)    Reaction:  Causes pts sodium/potassium levels to drop significantly   . Metronidazole Other (See Comments)    Reaction:  Unknown   . Propranolol Other (See Comments)    Reaction:  Unknown   . Tavist [Clemastine] Other (See Comments)    Reaction:  Unknown   . Tramadol Other (See Comments)    Reaction:  Unknown   . Vicodin [Hydrocodone-Acetaminophen] Other (See Comments)    Reaction:  Unknown    VITALS:  Blood pressure (!) 139/92, pulse (!) 53, temperature 97.9 F (36.6 C), temperature source Oral, resp. rate 16, height 4\' 10"  (1.473 m), weight 49.8 kg (109 lb 12.8 oz), SpO2 97  %. PHYSICAL EXAMINATION:  Physical Exam  Constitutional: She is oriented to person, place, and time and well-developed, well-nourished, and in no distress.  HENT:  Head: Normocephalic and atraumatic.  Eyes: Pupils are equal, round, and reactive to light. Conjunctivae and EOM are normal.  Neck: Normal range of motion. Neck supple. No tracheal deviation present. No thyromegaly present.  Cardiovascular: Normal rate, regular rhythm and normal heart sounds.   Pulmonary/Chest: Effort normal and breath sounds normal. No respiratory distress. She has no wheezes. She exhibits no tenderness.  Abdominal: Soft. Bowel sounds are normal. She exhibits no distension. There is no tenderness.  Musculoskeletal: Normal range of motion.  Neurological: She is alert and oriented to person, place, and time. A cranial nerve deficit is present.  Unable to see from the left eye  Skin: Skin is warm and dry. No rash noted.  Psychiatric: Mood and affect normal.   LABORATORY PANEL:  Female CBC  Recent Labs Lab 03/14/17 0329  WBC 10.9  HGB 11.5*  HCT 33.5*  PLT 269   ------------------------------------------------------------------------------------------------------------------ Chemistries   Recent Labs Lab 03/14/17 0329  NA 131*  K 4.1  CL 88*  CO2 31  GLUCOSE 140*  BUN 15  CREATININE 0.55  CALCIUM 9.8   RADIOLOGY:  No results found. ASSESSMENT AND PLAN:  This is a 83 y.o. female with a history of afib not on AC, COPD, GERD, HLD, HTN hypothyroidism, pulmonary fibrosis, SIADH, TIA admitted with:  1. Acute CVA -  - MRI of the brain shows a new infarct of  mesial right temporal lobe that appears embolic. Also with late subacute of right occipital lobe new since last MRI, chronic hemorrhage also new relative to last MRI - Appreciate Neuro input, likely not a candidate for anticoagulation. D/w family at bedside and they are in agreement for not starting blood thinners - continue aspirin & Lipitor -  PT/ST recommends STR/SNF  2. History of HTN - Hold  Lasix, spironolactone for now. - permissive htn  3. History of COPD Continue Flovent, Spiriva  4. History of HLD Continue Lipitor  5. History of hypothyroidism Continue Synthroid  6. History of afib -Continue Cardizem for now  7. History of GERD Continue Protonix (for Pepcid)  8. Acute diastolic CHF: Mild fluid overload: seen on cxr -Stable now. continue 20 mg of oral Lasix every other day   Family deciding on STR/SNF vs home with home health.  C/s SW - d/w Fredric MareBailey   Patient's daughter shows [facility]   All the records are reviewed and case discussed with Care Management/Social Worker. Management plans discussed with the patient, family (daughter and son in law at bedside) and they are in agreement.  CODE STATUS: DNR  TOTAL TIME TAKING CARE OF THIS PATIENT: 35 minutes.   More than 50% of the time was spent in counseling/coordination of care: YES  POSSIBLE D/C IN 2 DAYS, DEPENDING ON CLINICAL CONDITION. WILL Need 3 MN stay   Delfino LovettVipul Lauramae Kneisley M.D on 03/14/2017 at 12:22 PM  Between 7am to 6pm - Pager - [phone]  After 6pm go to [email or web address] - Social research officer, governmentpassword [email or web address] [email or web address]  [facility]  Hospitalists  Office  [phone]  CC: Primary care physician; Rafael BihariWalker, John B III, MD  Note: This dictation was prepared with Dragon dictation along with smaller phrase technology. Any transcriptional errors that result from this process are unintentional.

## 2017-03-15 ENCOUNTER — Inpatient Hospital Stay: Payer: Medicare Other

## 2017-03-15 LAB — BASIC METABOLIC PANEL
ANION GAP: 11 (ref 5–15)
BUN: 43 mg/dL — ABNORMAL HIGH (ref 6–20)
CALCIUM: 9.4 mg/dL (ref 8.9–10.3)
CO2: 31 mmol/L (ref 22–32)
Chloride: 87 mmol/L — ABNORMAL LOW (ref 101–111)
Creatinine, Ser: 1.3 mg/dL — ABNORMAL HIGH (ref 0.44–1.00)
GFR calc Af Amer: 39 mL/min — ABNORMAL LOW (ref >60)
GFR, EST NON AFRICAN AMERICAN: 34 mL/min — AB (ref >60)
GLUCOSE: 148 mg/dL — AB (ref 65–99)
Potassium: 5.3 mmol/L — ABNORMAL HIGH (ref 3.5–5.1)
SODIUM: 129 mmol/L — AB (ref 135–145)

## 2017-03-15 LAB — URINALYSIS, ROUTINE W REFLEX MICROSCOPIC
BILIRUBIN URINE: NEGATIVE
Glucose, UA: NEGATIVE mg/dL
Hgb urine dipstick: NEGATIVE
KETONES UR: 5 mg/dL — AB
Leukocytes, UA: NEGATIVE
Nitrite: NEGATIVE
PROTEIN: 100 mg/dL — AB
Specific Gravity, Urine: 1.027 (ref 1.005–1.030)
pH: 5 (ref 5.0–8.0)

## 2017-03-15 LAB — CBC
HCT: 32.8 % — ABNORMAL LOW (ref 35.0–47.0)
Hemoglobin: 11.2 g/dL — ABNORMAL LOW (ref 12.0–16.0)
MCH: 29 pg (ref 26.0–34.0)
MCHC: 34.3 g/dL (ref 32.0–36.0)
MCV: 84.7 fL (ref 80.0–100.0)
PLATELETS: 227 10*3/uL (ref 150–440)
RBC: 3.86 MIL/uL (ref 3.80–5.20)
RDW: 14.5 % (ref 11.5–14.5)
WBC: 13.1 10*3/uL — AB (ref 3.6–11.0)

## 2017-03-15 LAB — LACTIC ACID, PLASMA: LACTIC ACID, VENOUS: 4.1 mmol/L — AB (ref 0.5–1.9)

## 2017-03-15 MED ORDER — VANCOMYCIN HCL IN DEXTROSE 750-5 MG/150ML-% IV SOLN
750.0000 mg | INTRAVENOUS | Status: DC
  Filled 2017-03-15: qty 150

## 2017-03-15 MED ORDER — ACETAMINOPHEN 650 MG RE SUPP
650.0000 mg | RECTAL | Status: DC | PRN
  Administered 2017-03-15 – 2017-03-16 (×2): 650 mg via RECTAL
  Filled 2017-03-15 (×3): qty 1

## 2017-03-15 MED ORDER — LORAZEPAM 0.5 MG PO TABS
0.2500 mg | ORAL_TABLET | Freq: Three times a day (TID) | ORAL | Status: DC
  Administered 2017-03-15: 0.25 mg via ORAL
  Filled 2017-03-15 (×2): qty 1

## 2017-03-15 MED ORDER — PIPERACILLIN-TAZOBACTAM 3.375 G IVPB
3.3750 g | Freq: Three times a day (TID) | INTRAVENOUS | Status: DC
  Administered 2017-03-15: 3.375 g via INTRAVENOUS
  Filled 2017-03-15: qty 50

## 2017-03-15 MED ORDER — VANCOMYCIN HCL IN DEXTROSE 750-5 MG/150ML-% IV SOLN
750.0000 mg | Freq: Once | INTRAVENOUS | Status: AC
  Administered 2017-03-15: 750 mg via INTRAVENOUS
  Filled 2017-03-15: qty 150

## 2017-03-15 MED ORDER — SODIUM CHLORIDE 0.9 % IV BOLUS (SEPSIS)
250.0000 mL | Freq: Once | INTRAVENOUS | Status: AC
  Administered 2017-03-15: 22:00:00 250 mL via INTRAVENOUS

## 2017-03-15 NOTE — Progress Notes (Addendum)
Family Meeting Note  Advance Directive:yes  Today a meeting took place with the Patient and Daughter and son in law.  Patient is unable to participate due HY:QMVHQIto:Lacked capacity Sleepy, AMS   The following clinical team members were present during this meeting:MD  The following were discussed:Patient's diagnosis: , Patient's progosis: < 6 months and Goals for treatment: DNR   This is a 83 y.o.femalewith a history of afib not on AC, COPD, GERD, HLD, HTN hypothyroidism, pulmonary fibrosis, SIADH, TIAadmitted with Acute CVA. Overall very poor prognosis. Family updated and appreciative.  Additional follow-up to be provided: Palliative care c/s - STR/SNF with palliative care to follow vs Hospice Home depending on clinical progress  Time spent during discussion:20 minutes  Delfino LovettVipul Mikkel Charrette, MD

## 2017-03-15 NOTE — Evaluation (Signed)
Occupational Therapy Evaluation Patient Details Name: Alyssa Landry MRN: [id number] DOB: 06-22-1919 Today's Date: 03/15/2017    History of Present Illness 83yo female pt presented to ER secondary to acute vision loss/change to L eye; admitted with acute CVA (R temporal/posterior R hippocampal infarct, late subacute R occipital).  Initial NIHSS 2.   Clinical Impression   Pt seen for OT Evaluation this date. At baseline, pt required remote supervision for functional mobility using a RW in the house (living with daughter and son in law) and daughter provided assist for sponge baths, dressing (was independent until a couple months ago), medication mgt, meal prep, household tasks, and driving. Pt presents with decreased alertness/lethargy, will open eyes with verbal cues briefly but unable to maintain open, following simple 1 step commands infrequently (<25% of the time), strength deficits, and impaired cognition. Difficult to assess visual fields/deficits and strength due to impaired cognition, although did squeeze therapist's hands with max verbal cues (fair- grip strength bilaterally). Pt with a few moments during the session where she would appropriately respond to questions asked briefly but most of the time unable to answer or requiring max verbal cues and additional time to respond. O2 sats >97% throughout session. HR using dinamap between 30 and 133 with pt at rest in bed, RN notified and in to assess along with medications at end of session. Pt will benefit from skilled OT services to address noted impairments and functional deficits in all aspects of self care in order to maximize return to PLOF and minimize risk of further decline/increased caregiver burden. Recommend STR following hospitalization.    Follow Up Recommendations  SNF    Equipment Recommendations  3 in 1 bedside commode    Recommendations for Other Services       Precautions / Restrictions Precautions Precautions:  Fall Restrictions Weight Bearing Restrictions: No      Mobility Bed Mobility Overal bed mobility: Needs Assistance Bed Mobility: Supine to Sit;Rolling Rolling: Max assist   Supine to sit: Max assist     General bed mobility comments: pt required max assist and max verbal cues to initiate, to sit up in order for daughter and OT to adjust upright posture for self feeding with pillows for support.  Transfers                 General transfer comment: deferred, unsafe to attempt due to pt cognition/lethargy    Balance Overall balance assessment: Needs assistance   Sitting balance-Leahy Scale: Zero Sitting balance - Comments: unable to maintain sitting balance long sitting in bed without max assist                                   ADL either performed or assessed with clinical judgement   ADL Overall ADL's : Needs assistance/impaired Eating/Feeding: Bed level Eating/Feeding Details (indicate cue type and reason): was able to take sip of coffee with set up from daughter and held coffee with eyes closed briefly before stating it was too sweet and daughter took cup. Pt refused to try any food. Grooming: Set up;Wash/dry face;Maximal assistance Grooming Details (indicate cue type and reason): verbal cues to initiate, pt unable to bring washcloth to face with max verbal cues and resisted physical assist. Upper Body Bathing: Bed level;Maximal assistance   Lower Body Bathing: Total assistance;Bed level   Upper Body Dressing : Maximal assistance;Bed level   Lower Body Dressing: Bed level;Total assistance  Toilet Transfer Details (indicate cue type and reason): unsafe to attempt           General ADL Comments: pt generally max to total assist for ADL tasks, decreased initiation requiring max verbal cues to participate     Vision Baseline Vision/History: Wears glasses (R peripheral vision deficits from previous stroke) Wears Glasses: At all  times Patient Visual Report: Other (comment) (came in with L eye deficits, daughter reports it appears to be improving but dificult to assess due to pt's cognition/lethargy) Vision Assessment?: Vision impaired- to be further tested in functional context Additional Comments: difficult to assess this date due to pt cognition/lethargy     Perception     Praxis      Pertinent Vitals/Pain Pain Assessment: No/denies pain     Hand Dominance Right   Extremity/Trunk Assessment Upper Extremity Assessment Upper Extremity Assessment: Generalized weakness;Difficult to assess due to impaired cognition (pt denies any sensory deficits)   Lower Extremity Assessment Lower Extremity Assessment: Difficult to assess due to impaired cognition;Generalized weakness (pt denies any sensory deficits)       Communication Communication Communication: No difficulties   Cognition Arousal/Alertness: Lethargic Behavior During Therapy: Flat affect Overall Cognitive Status: Difficult to assess                                 General Comments: decreased responsiveness, oriented to self, requires prompting to answer questions, follows simple 1 step commands very inconsistently (approx 25% of the time), poor task initiation   General Comments  O2 sats with nasal cannula in place >97% throughout session; HR between 30 and 133 with pt at rest in bed, RN notified and in to assess along with medications at end of session.    Exercises     Shoulder Instructions      Home Living Family/patient expects to be discharged to:: Private residence Living Arrangements: Children Available Help at Discharge: Family;Available 24 hours/day Type of Home: House Home Access: Stairs to enter [employer] of Steps: 1 (chair lift for primary entry/exit into home, 1 additional step after lift) Entrance Stairs-Rails: None Home Layout: One level     Bathroom Shower/Tub: Other (comment) (sponge bath per  chart review and pt report)   Bathroom Toilet: Standard     Home Equipment: Walker - 2 wheels;Grab bars - toilet          Prior Functioning/Environment Level of Independence: Needs assistance  Gait / Transfers Assistance Needed: mod indep with RW for household distances.  ADL's / Homemaking Assistance Needed: Per chart review, Daughter assists pt with sponge bath and dressing (as of a couple months ago), performs grooming from seated position after set up. Daughter does the cooking, cleaning, driving.  Dtr manages medications   Comments: Home O2 2L; pt endorses 1 fall (unable to provide details, daughter/son in law confirm 1 fall in bathroom)        OT Problem List: Decreased strength;Decreased cognition;Cardiopulmonary status limiting activity;Decreased activity tolerance;Decreased safety awareness;Impaired balance (sitting and/or standing);Impaired vision/perception      OT Treatment/Interventions: Self-care/ADL training;Therapeutic exercise;Therapeutic activities;DME and/or AE instruction;Neuromuscular education;Patient/family education;Balance training    OT Goals(Current goals can be found in the care plan section) Acute Rehab OT Goals Patient Stated Goal: for pt to get better OT Goal Formulation: With family Time For Goal Achievement: 03/29/17 Potential to Achieve Goals: Fair  OT Frequency: Min 2X/week   Barriers to D/C:  Co-evaluation              AM-PAC PT "6 Clicks" Daily Activity     Outcome Measure Help from another person eating meals?: A Little Help from another person taking care of personal grooming?: A Lot Help from another person toileting, which includes using toliet, bedpan, or urinal?: Total Help from another person bathing (including washing, rinsing, drying)?: Total Help from another person to put on and taking off regular upper body clothing?: A Lot Help from another person to put on and taking off regular lower body clothing?:  Total 6 Click Score: 10   End of Session Nurse Communication: Other (comment) (HR 30-133 at rest using dinamap)  Activity Tolerance: Patient limited by lethargy Patient left: in bed;with call bell/phone within reach;with bed alarm set;with nursing/sitter in room;with family/visitor present  OT Visit Diagnosis: Other abnormalities of gait and mobility (R26.89);Other symptoms and signs involving cognitive function;Muscle weakness (generalized) (M62.81)                Time: [id number] OT Time Calculation (min): 37 min Charges:  OT General Charges $OT Visit: 1 Visit OT Evaluation $OT Eval Moderate Complexity: 1 Mod OT Treatments $Self Care/Home Management : 8-22 mins G-Codes: OT G-codes **NOT FOR INPATIENT CLASS** Functional Assessment Tool Used: AM-PAC 6 Clicks Daily Activity;Clinical judgement Functional Limitation: Self care Self Care Current Status (V4098(G8987): At least 80 percent but less than 100 percent impaired, limited or restricted Self Care Goal Status (J1914(G8988): At least 40 percent but less than 60 percent impaired, limited or restricted   Richrd PrimeJamie Stiller, MPH, MS, OTR/L ascom [phone] 03/15/17, 9:47 AM

## 2017-03-15 NOTE — Progress Notes (Addendum)
Patient ID: Alyssa Landry, female   DOB: [birth year], 83 y.o.   MRN: [id number]   Rec'd a call from nursing regarding decline in patient's status.  Nurse Rande LawmanErwin reports tachycardia, tachypnea, fever and hypoxia as well as increased work of breathing and abdominal distention.  Lactic acid level is 4.1  BP 123/77 (BP Location: Right Arm)   Pulse 80   Temp (!) 102.9 F (39.4 C)   Resp (!) 32   Ht 4\' 10"  (1.473 m)   Wt 49.8 kg (109 lb 12.8 oz)   SpO2 99%   BMI 22.95 kg/m   Minimally responsive with tachypnea, diminished breath sounds at the bases Mildly distended abdomen with positive bowel sounds.   Will obtain blood cultures, give fluid bolus, Tylenol, start Zosyn and Vanco empirically.  Repeat labs and CXR as well as an abdominal XR for distention.    Discussed plan of care with patient's daughter Alyssa Landry who agrees with pursuing workup and treatment.  Confirms DNR.  Palliative to see patient tomorrow.  Monitor closely.

## 2017-03-15 NOTE — Progress Notes (Signed)
[facility] [place] [facility]   PATIENT NAME: Alyssa Landry    MR#:  [id number]  DATE OF BIRTH:  Aug 03, 1918  SUBJECTIVE:  CHIEF COMPLAINT:   Chief Complaint  Patient presents with  . Code Stroke  Not eating much, seemed to be declining clinically.  Not wanting to go to rehab all the family agrees that she does need to go to rehab. REVIEW OF SYSTEMS:  Review of Systems  Constitutional: Negative for chills, fever and weight loss.  HENT: Negative for nosebleeds and sore throat.   Eyes: Negative for blurred vision.  Respiratory: Negative for cough, shortness of breath and wheezing.   Cardiovascular: Negative for chest pain, orthopnea, leg swelling and PND.  Gastrointestinal: Negative for abdominal pain, constipation, diarrhea, heartburn, nausea and vomiting.  Genitourinary: Negative for dysuria and urgency.  Musculoskeletal: Positive for neck pain. Negative for back pain.  Skin: Negative for rash.  Neurological: Negative for dizziness, speech change, focal weakness and headaches.  Endo/Heme/Allergies: Does not bruise/bleed easily.  Psychiatric/Behavioral: Negative for depression.   DRUG ALLERGIES:   Allergies  Allergen Reactions  . Aspirin Other (See Comments)    Upset stomach with high doses  . Codeine Other (See Comments)    Reaction:  Unknown   . Ditropan [Oxybutynin] Other (See Comments)    Reaction:  Unknown   . Hctz [Hydrochlorothiazide] Other (See Comments)    Reaction:  Causes pts sodium/potassium levels to drop significantly   . Metronidazole Other (See Comments)    Reaction:  Unknown   . Propranolol Other (See Comments)    Reaction:  Unknown   . Tavist [Clemastine] Other (See Comments)    Reaction:  Unknown   . Tramadol Other (See Comments)    Reaction:  Unknown   . Vicodin [Hydrocodone-Acetaminophen] Other (See Comments)    Reaction:  Unknown    VITALS:  Blood pressure 138/67, pulse 99, temperature 97.9 F (36.6 C), temperature  source Oral, resp. rate 20, height 4\' 10"  (1.473 m), weight 49.8 kg (109 lb 12.8 oz), SpO2 95 %. PHYSICAL EXAMINATION:  Physical Exam  Constitutional: She is oriented to person, place, and time and well-developed, well-nourished, and in no distress.  HENT:  Head: Normocephalic and atraumatic.  Eyes: Pupils are equal, round, and reactive to light. Conjunctivae and EOM are normal.  Neck: Normal range of motion. Neck supple. No tracheal deviation present. No thyromegaly present.  Cardiovascular: Normal rate, regular rhythm and normal heart sounds.   Pulmonary/Chest: Effort normal and breath sounds normal. No respiratory distress. She has no wheezes. She exhibits no tenderness.  Abdominal: Soft. Bowel sounds are normal. She exhibits no distension. There is no tenderness.  Musculoskeletal: Normal range of motion.  Neurological: She is alert and oriented to person, place, and time. A cranial nerve deficit is present.  Unable to see from the left eye  Skin: Skin is warm and dry. No rash noted.  Psychiatric: Mood and affect normal.   LABORATORY PANEL:  Female CBC  Recent Labs Lab 03/14/17 0329  WBC 10.9  HGB 11.5*  HCT 33.5*  PLT 269   ------------------------------------------------------------------------------------------------------------------ Chemistries   Recent Labs Lab 03/14/17 0329  NA 131*  K 4.1  CL 88*  CO2 31  GLUCOSE 140*  BUN 15  CREATININE 0.55  CALCIUM 9.8   RADIOLOGY:  No results found. ASSESSMENT AND PLAN:  This is a 83 y.o. female with a history of afib not on AC, COPD, GERD, HLD, HTN hypothyroidism, pulmonary fibrosis, SIADH,  TIA admitted with:  1. Acute CVA -  - MRI of the brain shows a new infarct of mesial right temporal lobe that appears embolic. Also with late subacute of right occipital lobe new since last MRI, chronic hemorrhage also new relative to last MRI - Appreciate Neuro input, likely not a candidate for anticoagulation. D/w family at  bedside and they are in agreement for not starting blood thinners - continue aspirin & Lipitor - PT/ST recommends STR/SNF  2. History of HTN - Hold Lasix, spironolactone for now. - permissive htn.  Pressure well controlled at this time  3. History of COPD Continue Flovent, Spiriva  4. History of HLD Continue Lipitor  5. History of hypothyroidism Continue Synthroid  6. History of afib -Continue Cardizem for now  7. History of GERD Continue Protonix (for Pepcid)  8. Acute diastolic CHF: Mild fluid overload: seen on cxr -Stable now. continue 20 mg of oral Lasix every other day  *Family is concerned that she is declined medical condition -will order UA and chest x-ray today.    Family deciding on STR/SNF palliative care to follow.  If she continues to have more decline may be candidate for hospice home.  Discussed with palliative care  C/s SW - d/w Minerva Areola   Patient's daughter shows [facility]   All the records are reviewed and case discussed with Care Management/Social Worker. Management plans discussed with the patient, family (daughter and son in law at bedside) and they are in agreement.  CODE STATUS: DNR  TOTAL TIME TAKING CARE OF THIS PATIENT: 35 minutes.   More than 50% of the time was spent in counseling/coordination of care: YES  POSSIBLE D/C IN 1 DAYS, DEPENDING ON CLINICAL CONDITION. WILL Need 3 MN stay   Delfino Lovett M.D on 03/15/2017 at 12:32 PM  Between 7am to 6pm - Pager - [phone]  After 6pm go to [email or web address] - Social research officer, government  [facility] [place]  Office  [phone]  CC: Primary care physician; Rafael Bihari, MD  Note: This dictation was prepared with Dragon dictation along with smaller phrase technology. Any transcriptional errors that result from this process are unintentional.

## 2017-03-15 NOTE — Progress Notes (Signed)
Clinical Social Worker (CSW) met with patient's daughter Mardene Celeste at bedside to confirm D/C plan. Daughter has chosen for patient to go to [facility] with palliative following. [facility] liaison is aware of above. CSW will continue to follow and assist as needed.   [employer], LCSW [phone]

## 2017-03-15 NOTE — Progress Notes (Signed)
Pharmacy Antibiotic Note  Alyssa Landry is a 83 y.o. female admitted on 01-14-2017 with sepsis.  Pharmacy has been consulted for vancomycin and Zosyn dosing.  Plan: DW 50kg  Vd 35L kei 0.037 hr-1  T1/2 19 hours Vancomycin 750 mg q 24 hours ordered with stacked dosing  Zosyn 3.375 grams q 8 hours ordered  Height: 4\' 10"  (147.3 cm) Weight: 109 lb 12.8 oz (49.8 kg) IBW/kg (Calculated) : 40.9  Temp (24hrs), Avg:98.7 F (37.1 C), Min:97.6 F (36.4 C), Max:101.3 F (38.5 C)   Recent Labs Lab 03/13/17 0025 03/14/17 0329 03/15/17 2201  WBC 7.2 10.9  --   CREATININE 0.55 0.55  --   LATICACIDVEN  --   --  4.1*    Estimated Creatinine Clearance: 28.9 mL/min (by C-G formula based on SCr of 0.55 mg/dL).    Allergies  Allergen Reactions  . Aspirin Other (See Comments)    Upset stomach with high doses  . Codeine Other (See Comments)    Reaction:  Unknown   . Ditropan [Oxybutynin] Other (See Comments)    Reaction:  Unknown   . Hctz [Hydrochlorothiazide] Other (See Comments)    Reaction:  Causes pts sodium/potassium levels to drop significantly   . Metronidazole Other (See Comments)    Reaction:  Unknown   . Propranolol Other (See Comments)    Reaction:  Unknown   . Tavist [Clemastine] Other (See Comments)    Reaction:  Unknown   . Tramadol Other (See Comments)    Reaction:  Unknown   . Vicodin [Hydrocodone-Acetaminophen] Other (See Comments)    Reaction:  Unknown     Antimicrobials this admission: vancomycin Zosyn 9/4 >>    >>   Dose adjustments this admission:   Microbiology results: 9/3 BCx: pending 9/1 MRSA PCR: (+)      9/3 UA: (-)  Thank you for allowing pharmacy to be a part of this patient's care.  Guss Farruggia S 03/15/2017 11:22 PM

## 2017-03-15 NOTE — Progress Notes (Signed)
Physical Therapy Treatment Patient Details Name: Alyssa Landry MRN: [id number] DOB: [birth year] Today's Date: 03/15/2017    History of Present Illness presented to ER secondary to acute vision loss/change to L eye; admitted with acute CVA (R temporal/posterior R hippocampal infarct, late subacute R occipital).  Initial NIHSS 2.    PT Comments    Patient continues to be quite lethargic and has difficulty with maintaining her eyes open throughout this treatment session. She is able to verbally interact with therapist intermittently, however she appears to come in and out of alertness and fatigues quite quickly with sitting (3-4 minutes). She appears to have improved somewhat from yesterday's session, but is still not quite where she was at in initial evaluation (ambulated 40' with RW).    Follow Up Recommendations  SNF     Equipment Recommendations       Recommendations for Other Services       Precautions / Restrictions Precautions Precautions: Fall Restrictions Weight Bearing Restrictions: No    Mobility  Bed Mobility Overal bed mobility: Needs Assistance Bed Mobility: Supine to Sit;Sit to Supine     Supine to sit: Max assist Sit to supine: Max assist   General bed mobility comments: Patient is able to provide very minimal LE assistance to bring her LEs off the edge of the bed, however she is unable to provide any assistance through her torso.   Transfers                 General transfer comment: deferred, unsafe to attempt due to pt cognition/lethargy  Ambulation/Gait             General Gait Details: unsafe/unable to tolerate   Stairs            Wheelchair Mobility    Modified Rankin (Stroke Patients Only)       Balance Overall balance assessment: Needs assistance Sitting-balance support: No upper extremity supported;Feet supported Sitting balance-Leahy Scale: Fair Sitting balance - Comments: Patient is able to sit upright without  significant loss of balance, but fatigues after 3-4 minutes.                                     Cognition Arousal/Alertness: Lethargic Behavior During Therapy: Flat affect Overall Cognitive Status: Difficult to assess                                 General Comments: Patient is able to appropriately interact intermittently with therapist then appears to fall asleep only to wake up and interact again. She keeps her eyes closed throughout the majority of the session.       Exercises General Exercises - Lower Extremity Long Arc Quad: AAROM;Both;15 reps Heel Slides: AAROM;Both;10 reps Straight Leg Raises: AAROM;Both;10 reps Hip Flexion/Marching: AAROM;Both;5 reps    General Comments        Pertinent Vitals/Pain Pain Assessment: No/denies pain    Home Living                      Prior Function            PT Goals (current goals can now be found in the care plan section) Acute Rehab PT Goals Patient Stated Goal: for pt to get better PT Goal Formulation: With patient/family Time For Goal Achievement: 03/27/17 Potential to Achieve Goals: Fair  Progress towards PT goals: Progressing toward goals (Improved from yesterday but not where she was at initial evaluation)    Frequency    7X/week      PT Plan Current plan remains appropriate    Co-evaluation              AM-PAC PT "6 Clicks" Daily Activity  Outcome Measure  Difficulty turning over in bed (including adjusting bedclothes, sheets and blankets)?: Unable Difficulty moving from lying on back to sitting on the side of the bed? : Unable Difficulty sitting down on and standing up from a chair with arms (e.g., wheelchair, bedside commode, etc,.)?: Unable Help needed moving to and from a bed to chair (including a wheelchair)?: Total Help needed walking in hospital room?: Total Help needed climbing 3-5 steps with a railing? : Total 6 Click Score: 6    End of Session  Equipment Utilized During Treatment: Oxygen Activity Tolerance: Patient limited by fatigue;Patient limited by lethargy Patient left: in bed;with call bell/phone within reach;with bed alarm set;with family/visitor present Nurse Communication: Mobility status PT Visit Diagnosis: Difficulty in walking, not elsewhere classified (R26.2);Muscle weakness (generalized) (M62.81)     Time: [id number] PT Time Calculation (min) (ACUTE ONLY): 17 min  Charges:  $Therapeutic Exercise: 8-22 mins                    G Codes:       Alva GarnetPatrick Razi Hickle PT, DPT, CSCS    03/15/2017, 3:09 PM

## 2017-03-15 NOTE — Progress Notes (Signed)
CRITICAL VALUE ALERT  Critical Value:  Lactic Acid of 4.1 Date & Time Notied: 03/15/17 at 2250  Provider Notified: Dr. Emmit PomfretHugelmeyer  Orders Received/Actions taken: New orders made and carried out.

## 2017-03-15 NOTE — Plan of Care (Signed)
Problem: Nutrition: Goal: Adequate nutrition will be maintained Outcome: Not Progressing Poor appetite. Pt encouraged to eat/drink, but had only sips. Pt does not drink the Ensure.  Problem: Nutrition: Goal: Dietary intake will improve Outcome: Not Progressing As above.

## 2017-03-15 NOTE — Progress Notes (Signed)
Pt had nose bleed from R nostril. Pressure applied, bleeding stopped. Per pt's daughter pt had nose bleed x2 prior to admission. Dr Sherryll BurgerShah notified. lovenox and aspirin d/c per MD order.

## 2017-03-16 LAB — LACTIC ACID, PLASMA: LACTIC ACID, VENOUS: 1.9 mmol/L (ref 0.5–1.9)

## 2017-03-16 MED ORDER — SODIUM CHLORIDE 0.9 % IV BOLUS (SEPSIS): 1000.0000 mL | Freq: Once | INTRAVENOUS | Status: DC

## 2017-03-19 IMAGING — MR MR HEAD WO/W CM
13 series · 47 of 48 positions shown · IV contrast (multihance)
Comparison: CT head without contrast 08/20/2016. MRI brain
09/26/2015

CLINICAL DATA: Cerebral infarction.

EXAM:
MRI HEAD WITHOUT AND WITH CONTRAST
TECHNIQUE: Multiplanar, multiecho pulse sequences of the brain and surrounding
structures were obtained without and with intravenous contrast.
CONTRAST:  9mL MULTIHANCE GADOBENATE DIMEGLUMINE 529 MG/ML IV SOLN

[Series 2: T1 · sagittal · 5.0mm · 0.45mm/px · 3 of 26 slices shown (1 of 2)]
[im 1/26]
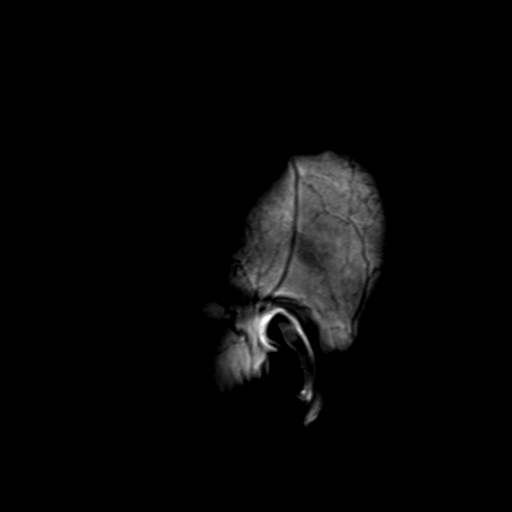
[im 13/26]
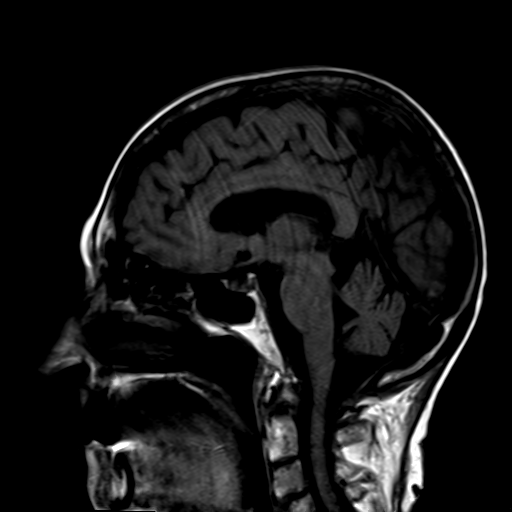
[im 26/26]
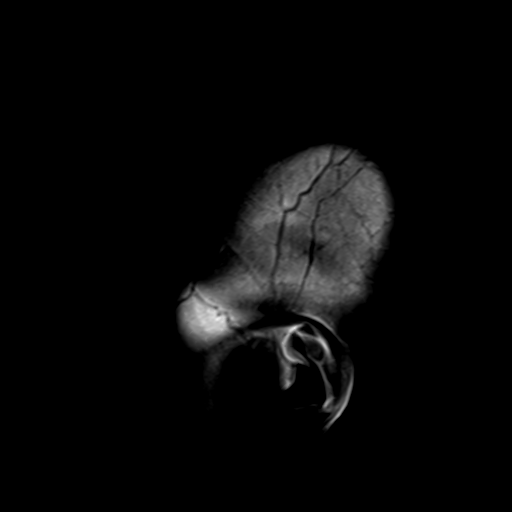

[Series 4: DWI · axial · 3.0mm · 1.80mm/px · z∈[-88,+73]mm · 5 of 54 slices shown (1 of 4)]
[im 1/54]
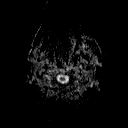
[im 14/54]
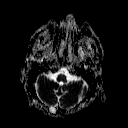
[im 27/54]
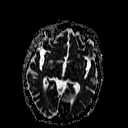
[im 40/54]
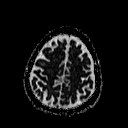
[im 54/54]
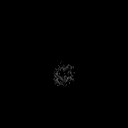

[Series 6: DWI · coronal · 3.0mm · 1.80mm/px · 4 of 45 slices shown (2 of 4)]
[im 1/45]
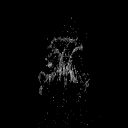
[im 15/45]
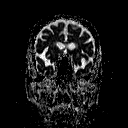
[im 30/45]
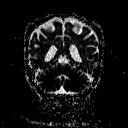
[im 45/45]
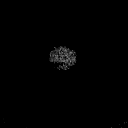

[Series 7: T2 · axial · 5.0mm · 0.60mm/px · z∈[-90,+66]mm · 2 of 25 slices shown (1 of 3)]
[im 1/25]
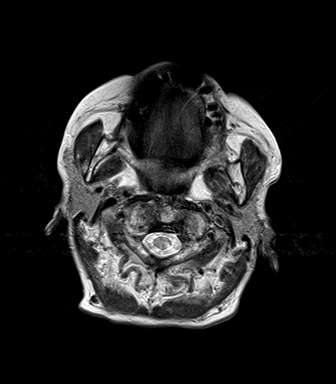
[im 25/25]
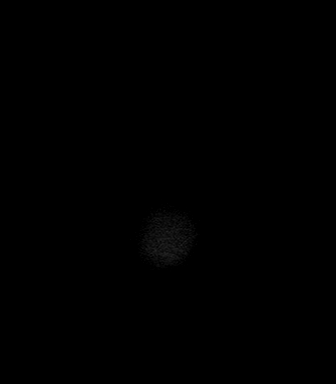

[Series 8: FLAIR · axial · 5.0mm · 0.45mm/px · z∈[-90,+66]mm · 2 of 25 slices shown]
[im 1/25]
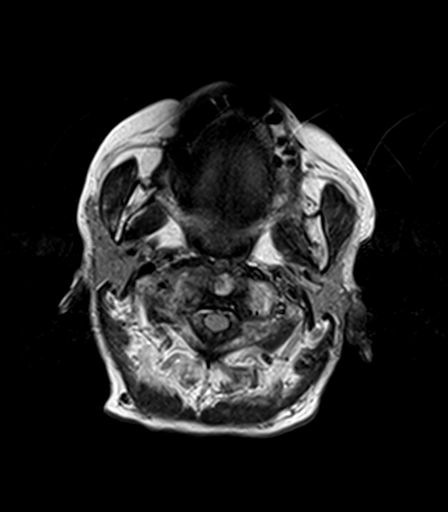
[im 25/25]
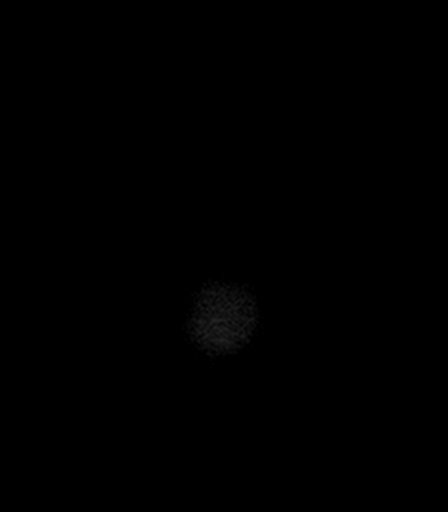

[Series 9: T2 · axial · 5.0mm · 0.45mm/px · z∈[-90,+66]mm · 2 of 25 slices shown (2 of 3)]
[im 1/25]
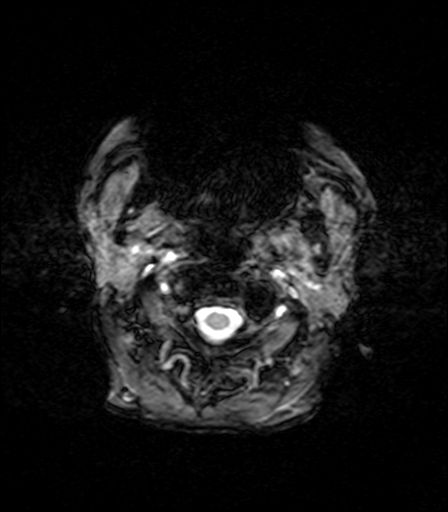
[im 25/25]
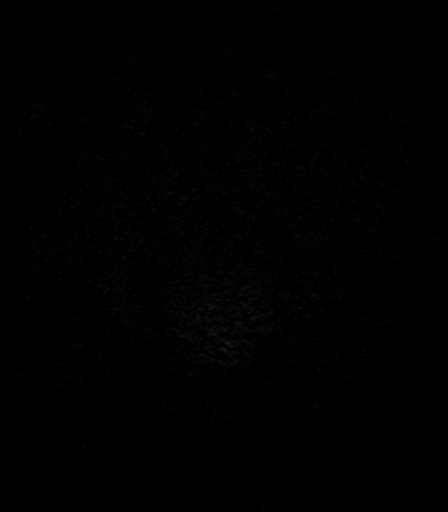

[Series 10: T1 · axial · 3.0mm · 1.00mm/px · z∈[-100,+41]mm · 5 of 60 slices shown (2 of 2)]
[im 1/60]
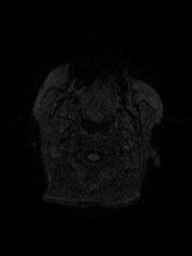
[im 12/60]
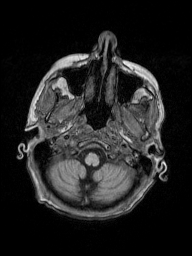
[im 24/60]
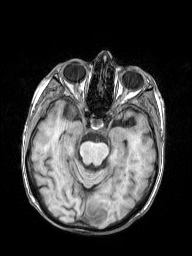
[im 36/60]
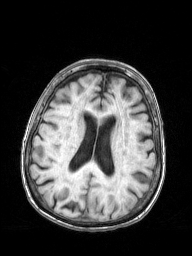
[im 48/60]
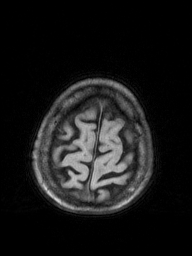

[Series 11: T2 · coronal · 5.0mm · 0.49mm/px · 3 of 27 slices shown (3 of 3)]
[im 1/27]
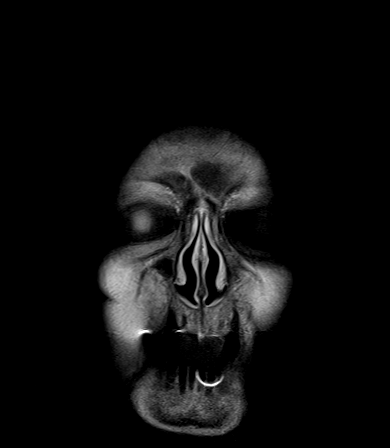
[im 14/27]
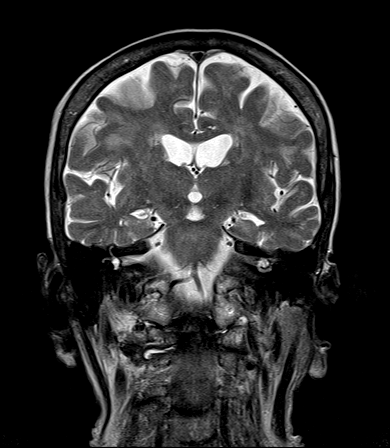
[im 27/27]
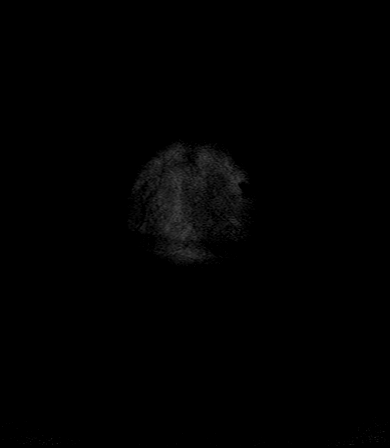

[Series 13: T1 post-contrast · axial · 3.0mm · 1.00mm/px · z∈[-100,+77]mm · 6 of 60 slices shown (1 of 3)]
[im 1/60]
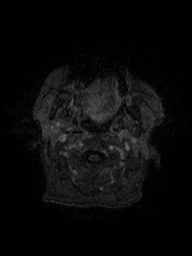
[im 12/60]
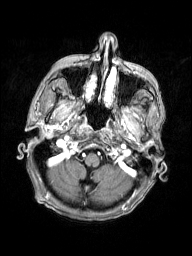
[im 24/60]
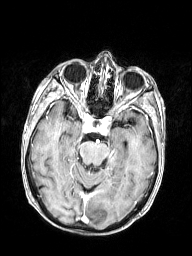
[im 36/60]
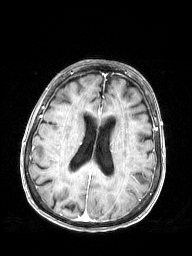
[im 48/60]
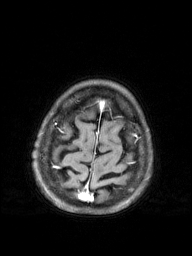
[im 60/60]
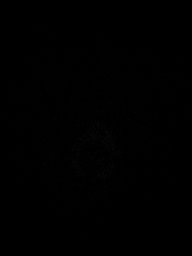

[Series 14: T1 post-contrast · coronal · 5.0mm · 0.43mm/px · 3 of 27 slices shown (2 of 3)]
[im 1/27]
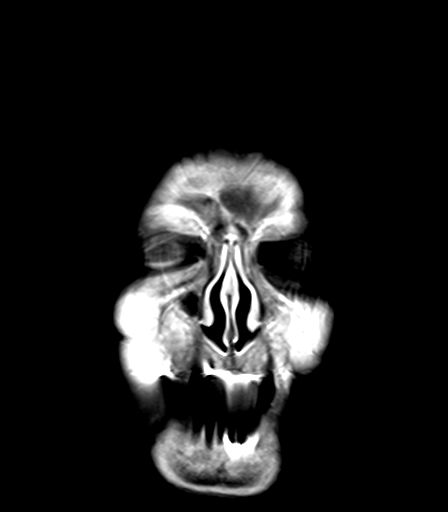
[im 14/27]
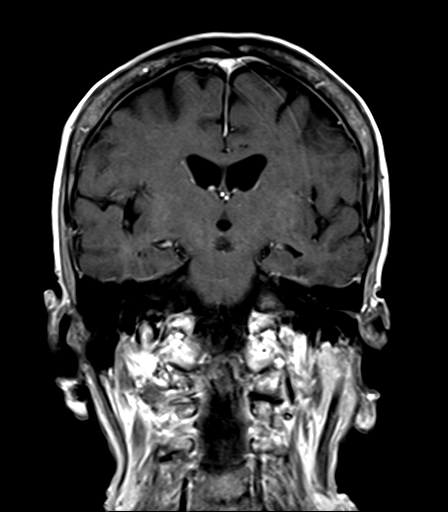
[im 27/27]
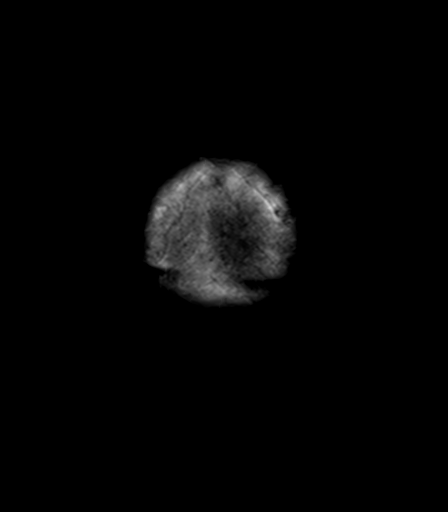

[Series 15: T1 post-contrast · sagittal · 5.0mm · 0.45mm/px · 3 of 26 slices shown (3 of 3)]
[im 1/26]
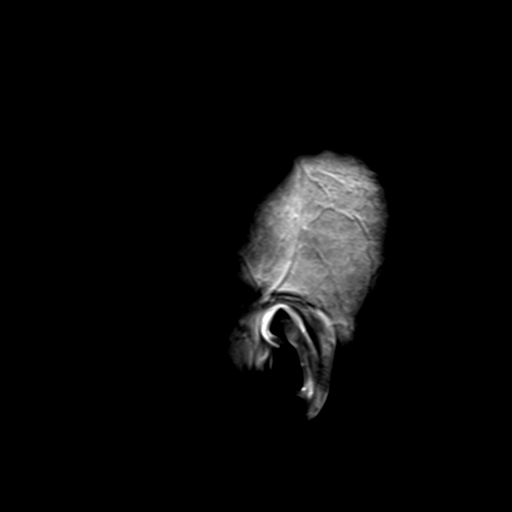
[im 13/26]
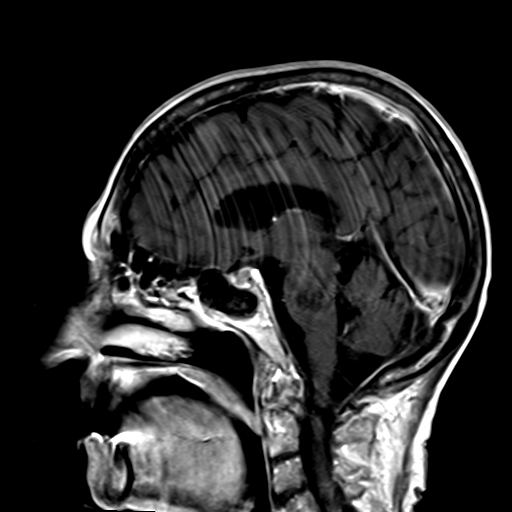
[im 26/26]
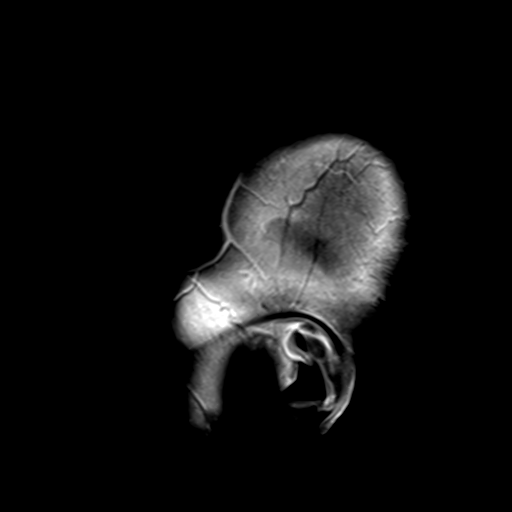

[Series 100: DWI · axial · 3.0mm · 1.80mm/px · z∈[-88,+73]mm · 5 of 54 slices shown (3 of 4)]
[im 1/54]
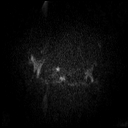
[im 14/54]
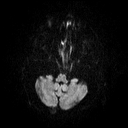
[im 27/54]
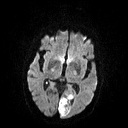
[im 40/54]
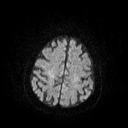
[im 54/54]
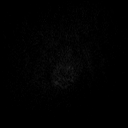

[Series 101: DWI · coronal · 3.0mm · 1.80mm/px · 4 of 45 slices shown (4 of 4)]
[im 1/45]
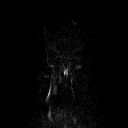
[im 15/45]
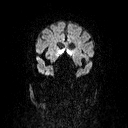
[im 30/45]
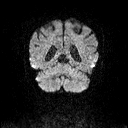
[im 45/45]
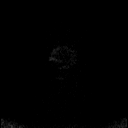

[47 of 48 positions shown; findings below may reference images not displayed]

FINDINGS: Brain: The diffusion-weighted images demonstrate restricted
diffusion within the left occipital lobe. Susceptibility suggest
blood products within the cortical regions. There is also mild
enhancement. No enhancing mass lesion is present.

Mild atrophy is within normal limits for age. Moderate diffuse white
matter changes are seen bilaterally. No other focal cortical infarct
is present.

Vascular: Flow is present in the major intracranial arteries.

Skull and upper cervical spine: The skullbase is within normal
limits. Midline sagittal structures are unremarkable. Grade 1
anterolisthesis at C3-4 measures 2 mm. Marrow signal is normal.

Sinuses/Orbits: The paranasal sinuses and mastoid air cells are
clear. Bilateral lens replacements are present. The globes and
orbits are otherwise within normal limits.
IMPRESSION: 1. Acute/subacute left occipital lobe infarct with hemorrhagic
conversion.
2. Stable atrophy and moderate diffuse white matter disease
otherwise.

## 2017-03-20 LAB — CULTURE, BLOOD (ROUTINE X 2)
CULTURE: NO GROWTH
Culture: NO GROWTH
Special Requests: ADEQUATE
Special Requests: ADEQUATE

## 2017-04-12 NOTE — Progress Notes (Signed)
At around 0400, on assessment, patient was unresponsive with no visible rise and fall of chest and pulses were undetected. Writer and a fellow RN pronounced the patient death. Dr. Vivien RotaPavan Pyreddy, House Supervisor were made aware. Chaplain was paged. Contacted family member [name].  [facility] was contacted and they gave a full release of the body. At 0430, Family member at bedside with Clinical research associatewriter, charge nurse and chaplain. Emotional support provided to family members. Needs attended.

## 2017-04-12 NOTE — Progress Notes (Signed)
  Informed patients daughter patricia regarding patients worsening shortness of breath and low blood pressure.  Over all prognosis poor. Patient is DNR by code status.

## 2017-04-12 NOTE — Death Summary Note (Signed)
DEATH SUMMARY   Patient Details  Name: Alyssa Landry MRN: [id number] DOB: 1919-05-09  Admission/Discharge Information   Admit Date:  03-19-2017  Date of Death: Date of Death: Mar 23, 2017  Time of Death: Time of Death: 0400  Length of Stay: 3  Referring Physician: Rafael Bihari, MD   Reason(s) for Hospitalization  Stroke  Diagnoses  Preliminary cause of death: Acute stroke Secondary Diagnoses (including complications and co-morbidities):  Active Problems:   CVA (cerebral vascular accident) (HCC)   Pressure injury of skin   Stroke (cerebrum) (HCC) Hypertension COPD Atrial fibrillation Diastolic CHF Hypothyroidism GERD  Brief Hospital Course (including significant findings, care, treatment, and services provided and events leading to death)  Alyssa Landry is a 83 y.o. year old female with a history of afib not on AC, COPD, GERD, HLD, HTN, hypothyroidism, pulmonary fibrosis, SIADH, TIAadmitted with:  1. Acute CVA -  - MRI of the brain showed a new infarct of mesial right temporal lobe that appears embolic. Also with late subacute of right occipital lobe new since last MRI, chronic hemorrhage also new relative to last MRI  2. History of HTN  3. History of COPD  4. History of HLD  5. History of hypothyroidism  6. History of afib  7. History of GERD  8. Acute diastolic CHF:  Pertinent Labs and Studies  Significant Diagnostic Studies Dg Chest 1 View  Result Date: 03/13/2017 CLINICAL DATA:  Asthma exacerbation with shortness of breath that worsened after her breathing treatments. EXAM: Portable CHEST 1 VIEW COMPARISON:  02/14/2017, 01/28/2017 and earlier, including CT chest 02/21/2016. FINDINGS: Cardiac silhouette moderately enlarged, unchanged. Thoracic aorta atherosclerotic, unchanged. Hilar and mediastinal contours otherwise unremarkable. Chronic moderately large left pleural effusion, unchanged since the x-ray 3 weeks ago, slightly increased in size  since March, 2018. Associated consolidation in the left lower lobe. Pulmonary venous hypertension and mild interstitial pulmonary edema, new since the most recent prior examination. Small right pleural effusion suspected. Fluid in the major and minor fissure on the right as noted previously. IMPRESSION: 1. Mild CHF suspected, with stable moderate cardiac enlargement and mild interstitial pulmonary edema. 2. Stable moderately large chronic left pleural effusion and associated passive atelectasis in the left lower lobe. 3.  Aortic Atherosclerosis (ICD10-170.0) Electronically Signed   By: Hulan Saas M.D.   On: 03/13/2017 10:27   Mr Angiogram Head Wo Contrast  Result Date: 03/19/17 CLINICAL DATA:  Initial evaluation for acute visual field loss in left eye. History of prior stroke. EXAM: MRI HEAD WITHOUT CONTRAST MRA HEAD WITHOUT CONTRAST MRA NECK WITHOUT CONTRAST TECHNIQUE: Multiplanar, multiecho pulse sequences of the brain and surrounding structures were obtained without intravenous contrast. Angiographic images of the Circle of Willis were obtained using MRA technique without intravenous contrast. Angiographic images of the neck were obtained using MRA technique without intravenous contrast. Carotid stenosis measurements (when applicable) are obtained utilizing NASCET criteria, using the distal internal carotid diameter as the denominator. CONTRAST:  None. COMPARISON:  Prior CT from earlier the same day as well as previous MRI from 09/05/2016. FINDINGS: MRI HEAD FINDINGS Advance generalized age-related cerebral atrophy. Patchy and confluent T2/FLAIR hyperintensity within the periventricular and deep white matter both cerebral hemispheres, most consistent with chronic microvascular ischemic disease, also fairly advanced in nature. Overall, changes are similar to previous. Normal expected interval evolution of chronic hemorrhagic left PCA territory infarct. There is a chronic anterior right MCA territory  infarct involving the right frontal operculum (series 7, image 16). While this  is chronic in appearance, this is new relative to previous MRI from 09/05/2016. Scattered associated chronic blood products an laminar necrosis about this area of chronic infarction. Approximate 1 cm focus of restricted diffusion within the posterior aspect of the mesial right temporal lobe, posterior right hippocampal formation (series 100, image 26), consistent with acute ischemic infarct. No associated mass effect or hemorrhage. Linear susceptibility artifact with mild diffusion abnormality within the adjacent mesial right occipital lobe, suspected to reflect sequelae of late subacute to chronic ischemia (series 100, image 28). This is also new from previous. No other evidence for acute or subacute ischemia. Gray-white matter differentiation otherwise maintained. No evidence for acute intracranial hemorrhage. No mass lesion, midline shift or mass effect. Mild ventricular prominence related to global parenchymal volume loss without hydrocephalus. No extra-axial fluid collection. Major dural sinuses grossly patent. Pituitary gland within normal limits. Major intracranial vascular flow voids maintained. Degenerative thickening of the tectorial membrane noted. Craniocervical junction otherwise unremarkable without significant stenosis. Visualized upper cervical spine unremarkable. Bone marrow signal intensity within normal limits. No scalp soft tissue abnormality. Globes and orbital soft tissues within normal limits. Patient status post lens extraction bilaterally. Paranasal sinuses are clear. No mastoid effusion. Inner ear structures normal. MRA HEAD FINDINGS ANTERIOR CIRCULATION: Distal cervical segments of the internal carotid arteries are patent with antegrade flow. Petrous segments patent without flow-limiting stenosis. Multifocal atheromatous irregularity within the cavernous ICAs without high-grade stenosis. Supraclinoid segments  patent. ICA termini widely patent. A1 segments patent bilaterally. Anterior communicating artery normal. Anterior cerebral artery is widely patent to their distal aspects without flow-limiting stenosis. M1 segments demonstrate multifocal atheromatous irregularity but are widely patent without flow-limiting stenosis or occlusion. MCA bifurcations normal. No proximal M2 occlusion. Distal MCA branches well opacified and symmetric. POSTERIOR CIRCULATION: Vertebral artery's patent to the vertebrobasilar junction without flow-limiting stenosis. Posterior inferior cerebral artery is partially visualized but patent bilaterally. Basilar artery widely patent to its distal aspect without stenosis. Superior cerebral arteries patent bilaterally. Both of the posterior cerebral artery is supplied via the basilar artery. PCAs patent to their distal aspects without proximal stenosis or occlusion. No aneurysm or vascular malformation. MRA NECK FINDINGS Examination limited due to lack of IV contrast. Aortic arch and origin of great vessels not evaluated on this exam. Visualize right common and internal carotid artery's patent within the neck without obvious flow-limiting stenosis no significant atheromatous narrowing about the right carotid bifurcation. Please note that the proximal right common carotid artery not well evaluated. Visualized left common and internal carotid artery's patent within the neck without obvious flow-limiting stenosis. No significant atheromatous narrowing about the left carotid bifurcation. Please note that the proximal left common carotid artery not well evaluated. Proximal/origin of the vertebral arteries not evaluated. Right vertebral artery patent with antegrade flow without significant stenosis. Focal irregularity with moderate to severe stenosis present within the mid left V2 segment (series 111, image 10). Left vertebral artery otherwise patent to the skullbase. IMPRESSION: MRI HEAD IMPRESSION: 1. 1 cm  acute ischemic nonhemorrhagic infarct involving the mesial right temporal lobe/posterior right hippocampal formation. No associated mass effect. 2. Additional probable late subacute to chronic infarct within the adjacent right occipital lobe (right PCA territory), not acute in appearance, but new relative to most recent MRI. 3. 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 4. Normal expected interval evolution of chronic hemorrhagic left PCA territory infarct. 5. Generalized age-related cerebral atrophy with advanced chronic microvascular ischemic disease. MRA  HEAD IMPRESSION: Negative intracranial MRA. No large vessel occlusion. No high-grade or correctable stenosis. MRA NECK IMPRESSION: 1. Limited study due to lack of IV contrast. 2. Patent carotid artery systems bilaterally without obvious flow-limiting or critical stenosis. 3. Short-segment moderate to severe mid left V2 stenosis. Vertebral arteries otherwise widely patent within the neck. Electronically Signed   By: Rise Mu M.D.   On: 02/22/2017 18:58   Mr Maxine Glenn Neck Wo Contrast  Result Date: 02/11/2017 CLINICAL DATA:  Initial evaluation for acute visual field loss in left eye. History of prior stroke. EXAM: MRI HEAD WITHOUT CONTRAST MRA HEAD WITHOUT CONTRAST MRA NECK WITHOUT CONTRAST TECHNIQUE: Multiplanar, multiecho pulse sequences of the brain and surrounding structures were obtained without intravenous contrast. Angiographic images of the Circle of Willis were obtained using MRA technique without intravenous contrast. Angiographic images of the neck were obtained using MRA technique without intravenous contrast. Carotid stenosis measurements (when applicable) are obtained utilizing NASCET criteria, using the distal internal carotid diameter as the denominator. CONTRAST:  None. COMPARISON:  Prior CT from earlier the same day as well as previous MRI from 09/05/2016. FINDINGS: MRI  HEAD FINDINGS Advance generalized age-related cerebral atrophy. Patchy and confluent T2/FLAIR hyperintensity within the periventricular and deep white matter both cerebral hemispheres, most consistent with chronic microvascular ischemic disease, also fairly advanced in nature. Overall, changes are similar to previous. Normal expected interval evolution of chronic hemorrhagic left PCA territory infarct. There is a chronic anterior right MCA territory infarct involving the right frontal operculum (series 7, image 16). While this is chronic in appearance, this is new relative to previous MRI from 09/05/2016. Scattered associated chronic blood products an laminar necrosis about this area of chronic infarction. Approximate 1 cm focus of restricted diffusion within the posterior aspect of the mesial right temporal lobe, posterior right hippocampal formation (series 100, image 26), consistent with acute ischemic infarct. No associated mass effect or hemorrhage. Linear susceptibility artifact with mild diffusion abnormality within the adjacent mesial right occipital lobe, suspected to reflect sequelae of late subacute to chronic ischemia (series 100, image 28). This is also new from previous. No other evidence for acute or subacute ischemia. Gray-white matter differentiation otherwise maintained. No evidence for acute intracranial hemorrhage. No mass lesion, midline shift or mass effect. Mild ventricular prominence related to global parenchymal volume loss without hydrocephalus. No extra-axial fluid collection. Major dural sinuses grossly patent. Pituitary gland within normal limits. Major intracranial vascular flow voids maintained. Degenerative thickening of the tectorial membrane noted. Craniocervical junction otherwise unremarkable without significant stenosis. Visualized upper cervical spine unremarkable. Bone marrow signal intensity within normal limits. No scalp soft tissue abnormality. Globes and orbital soft  tissues within normal limits. Patient status post lens extraction bilaterally. Paranasal sinuses are clear. No mastoid effusion. Inner ear structures normal. MRA HEAD FINDINGS ANTERIOR CIRCULATION: Distal cervical segments of the internal carotid arteries are patent with antegrade flow. Petrous segments patent without flow-limiting stenosis. Multifocal atheromatous irregularity within the cavernous ICAs without high-grade stenosis. Supraclinoid segments patent. ICA termini widely patent. A1 segments patent bilaterally. Anterior communicating artery normal. Anterior cerebral artery is widely patent to their distal aspects without flow-limiting stenosis. M1 segments demonstrate multifocal atheromatous irregularity but are widely patent without flow-limiting stenosis or occlusion. MCA bifurcations normal. No proximal M2 occlusion. Distal MCA branches well opacified and symmetric. POSTERIOR CIRCULATION: Vertebral artery's patent to the vertebrobasilar junction without flow-limiting stenosis. Posterior inferior cerebral artery is partially visualized but patent bilaterally. Basilar artery widely patent to its distal aspect without  stenosis. Superior cerebral arteries patent bilaterally. Both of the posterior cerebral artery is supplied via the basilar artery. PCAs patent to their distal aspects without proximal stenosis or occlusion. No aneurysm or vascular malformation. MRA NECK FINDINGS Examination limited due to lack of IV contrast. Aortic arch and origin of great vessels not evaluated on this exam. Visualize right common and internal carotid artery's patent within the neck without obvious flow-limiting stenosis no significant atheromatous narrowing about the right carotid bifurcation. Please note that the proximal right common carotid artery not well evaluated. Visualized left common and internal carotid artery's patent within the neck without obvious flow-limiting stenosis. No significant atheromatous narrowing  about the left carotid bifurcation. Please note that the proximal left common carotid artery not well evaluated. Proximal/origin of the vertebral arteries not evaluated. Right vertebral artery patent with antegrade flow without significant stenosis. Focal irregularity with moderate to severe stenosis present within the mid left V2 segment (series 111, image 10). Left vertebral artery otherwise patent to the skullbase. IMPRESSION: MRI HEAD IMPRESSION: 1. 1 cm acute ischemic nonhemorrhagic infarct involving the mesial right temporal lobe/posterior right hippocampal formation. No associated mass effect. 2. Additional probable late subacute to chronic infarct within the adjacent right occipital lobe (right PCA territory), not acute in appearance, but new relative to most recent MRI. 3. 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 4. Normal expected interval evolution of chronic hemorrhagic left PCA territory infarct. 5. Generalized age-related cerebral atrophy with advanced chronic microvascular ischemic disease. MRA HEAD IMPRESSION: Negative intracranial MRA. No large vessel occlusion. No high-grade or correctable stenosis. MRA NECK IMPRESSION: 1. Limited study due to lack of IV contrast. 2. Patent carotid artery systems bilaterally without obvious flow-limiting or critical stenosis. 3. Short-segment moderate to severe mid left V2 stenosis. Vertebral arteries otherwise widely patent within the neck. Electronically Signed   By: Rise Mu M.D.   On: 04-04-17 18:58   Mr Brain Wo Contrast  Result Date: 2017-04-04 CLINICAL DATA:  Initial evaluation for acute visual field loss in left eye. History of prior stroke. EXAM: MRI HEAD WITHOUT CONTRAST MRA HEAD WITHOUT CONTRAST MRA NECK WITHOUT CONTRAST TECHNIQUE: Multiplanar, multiecho pulse sequences of the brain and surrounding structures were obtained without intravenous contrast. Angiographic  images of the Circle of Willis were obtained using MRA technique without intravenous contrast. Angiographic images of the neck were obtained using MRA technique without intravenous contrast. Carotid stenosis measurements (when applicable) are obtained utilizing NASCET criteria, using the distal internal carotid diameter as the denominator. CONTRAST:  None. COMPARISON:  Prior CT from earlier the same day as well as previous MRI from 09/05/2016. FINDINGS: MRI HEAD FINDINGS Advance generalized age-related cerebral atrophy. Patchy and confluent T2/FLAIR hyperintensity within the periventricular and deep white matter both cerebral hemispheres, most consistent with chronic microvascular ischemic disease, also fairly advanced in nature. Overall, changes are similar to previous. Normal expected interval evolution of chronic hemorrhagic left PCA territory infarct. There is a chronic anterior right MCA territory infarct involving the right frontal operculum (series 7, image 16). While this is chronic in appearance, this is new relative to previous MRI from 09/05/2016. Scattered associated chronic blood products an laminar necrosis about this area of chronic infarction. Approximate 1 cm focus of restricted diffusion within the posterior aspect of the mesial right temporal lobe, posterior right hippocampal formation (series 100, image 26), consistent with acute ischemic infarct. No associated mass effect or hemorrhage. Linear susceptibility artifact with mild diffusion  abnormality within the adjacent mesial right occipital lobe, suspected to reflect sequelae of late subacute to chronic ischemia (series 100, image 28). This is also new from previous. No other evidence for acute or subacute ischemia. Gray-white matter differentiation otherwise maintained. No evidence for acute intracranial hemorrhage. No mass lesion, midline shift or mass effect. Mild ventricular prominence related to global parenchymal volume loss without  hydrocephalus. No extra-axial fluid collection. Major dural sinuses grossly patent. Pituitary gland within normal limits. Major intracranial vascular flow voids maintained. Degenerative thickening of the tectorial membrane noted. Craniocervical junction otherwise unremarkable without significant stenosis. Visualized upper cervical spine unremarkable. Bone marrow signal intensity within normal limits. No scalp soft tissue abnormality. Globes and orbital soft tissues within normal limits. Patient status post lens extraction bilaterally. Paranasal sinuses are clear. No mastoid effusion. Inner ear structures normal. MRA HEAD FINDINGS ANTERIOR CIRCULATION: Distal cervical segments of the internal carotid arteries are patent with antegrade flow. Petrous segments patent without flow-limiting stenosis. Multifocal atheromatous irregularity within the cavernous ICAs without high-grade stenosis. Supraclinoid segments patent. ICA termini widely patent. A1 segments patent bilaterally. Anterior communicating artery normal. Anterior cerebral artery is widely patent to their distal aspects without flow-limiting stenosis. M1 segments demonstrate multifocal atheromatous irregularity but are widely patent without flow-limiting stenosis or occlusion. MCA bifurcations normal. No proximal M2 occlusion. Distal MCA branches well opacified and symmetric. POSTERIOR CIRCULATION: Vertebral artery's patent to the vertebrobasilar junction without flow-limiting stenosis. Posterior inferior cerebral artery is partially visualized but patent bilaterally. Basilar artery widely patent to its distal aspect without stenosis. Superior cerebral arteries patent bilaterally. Both of the posterior cerebral artery is supplied via the basilar artery. PCAs patent to their distal aspects without proximal stenosis or occlusion. No aneurysm or vascular malformation. MRA NECK FINDINGS Examination limited due to lack of IV contrast. Aortic arch and origin of great  vessels not evaluated on this exam. Visualize right common and internal carotid artery's patent within the neck without obvious flow-limiting stenosis no significant atheromatous narrowing about the right carotid bifurcation. Please note that the proximal right common carotid artery not well evaluated. Visualized left common and internal carotid artery's patent within the neck without obvious flow-limiting stenosis. No significant atheromatous narrowing about the left carotid bifurcation. Please note that the proximal left common carotid artery not well evaluated. Proximal/origin of the vertebral arteries not evaluated. Right vertebral artery patent with antegrade flow without significant stenosis. Focal irregularity with moderate to severe stenosis present within the mid left V2 segment (series 111, image 10). Left vertebral artery otherwise patent to the skullbase. IMPRESSION: MRI HEAD IMPRESSION: 1. 1 cm acute ischemic nonhemorrhagic infarct involving the mesial right temporal lobe/posterior right hippocampal formation. No associated mass effect. 2. Additional probable late subacute to chronic infarct within the adjacent right occipital lobe (right PCA territory), not acute in appearance, but new relative to most recent MRI. 3. 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 4. Normal expected interval evolution of chronic hemorrhagic left PCA territory infarct. 5. Generalized age-related cerebral atrophy with advanced chronic microvascular ischemic disease. MRA HEAD IMPRESSION: Negative intracranial MRA. No large vessel occlusion. No high-grade or correctable stenosis. MRA NECK IMPRESSION: 1. Limited study due to lack of IV contrast. 2. Patent carotid artery systems bilaterally without obvious flow-limiting or critical stenosis. 3. Short-segment moderate to severe mid left V2 stenosis. Vertebral arteries otherwise widely patent within the neck.  Electronically Signed   By: Rise MuBenjamin  McClintock M.D.   On: 03/24/2017  18:58   Dg Chest Port 1 View  Result Date: 03/14/2017 CLINICAL DATA:  Tachypnea EXAM: PORTABLE CHEST 1 VIEW COMPARISON:  03/15/2017, 05/01/2016 FINDINGS: Continued left greater than right pleural effusions with consolidation at the left lung base. Stable cardiomegaly with central congestion, and mild perihilar edema. Aortic atherosclerosis. No pneumothorax. IMPRESSION: No significant interval change in left greater than right pleural effusions and left basilar consolidative process. Continued cardiomegaly with central congestion and mild perihilar opacity suggestive of minimal edema Electronically Signed   By: Jasmine Pang M.D.   On: 03/27/2017 00:05   Dg Chest Port 1 View  Result Date: 03/15/2017 CLINICAL DATA:  Cough, history of A-Fib, asthma, COPD, hypertension, TIA EXAM: PORTABLE CHEST 1 VIEW COMPARISON:  03/13/2017 FINDINGS: Stable moderate size left pleural effusion. Small right pleural effusion with fluid in the minor fissure. Mild to moderate enlargement of the cardiopericardial silhouette with atherosclerotic calcification of the aortic arch. Faint interstitial accentuation unchanged. Biapical pleuroparenchymal scarring. IMPRESSION: 1. No significant change. Moderate left and small right pleural effusions with cardiomegaly and mild interstitial pulmonary edema. 2.  Aortic Atherosclerosis (ICD10-I70.0). Electronically Signed   By: Gaylyn Rong M.D.   On: 03/15/2017 13:04   Dg Abd Portable 1v  Result Date: 04/09/2017 CLINICAL DATA:  Abdomen distension EXAM: PORTABLE ABDOMEN - 1 VIEW COMPARISON:  02/21/2016 FINDINGS: Bilateral effusions and left basilar consolidation. Mild diffuse increased bowel gas without definite obstructive pattern. Moderate feces retention in the rectum. No abnormal calcification. IMPRESSION: Mild diffuse increased bowel gas without definitive obstruction. Moderate feces retention in the rectum  Electronically Signed   By: Jasmine Pang M.D.   On: 03/20/2017 00:06   Ct Head Code Stroke Wo Contrast`  Result Date: 2017-03-27 CLINICAL DATA:  Code stroke.  Vision loss and neck pain. EXAM: CT HEAD WITHOUT CONTRAST TECHNIQUE: Contiguous axial images were obtained from the base of the skull through the vertex without intravenous contrast. COMPARISON:  02/14/2017 FINDINGS: Brain: Moderate remote right frontal infarct affecting the operculum and anterior insula. Remote moderate left parasagittal occipital infarct. Patchy chronic microvascular ischemic gliosis in the cerebral white matter. Age congruent generalized cerebral volume loss. No hemorrhage, hydrocephalus or masslike finding. Vascular: Atherosclerotic calcification.  No hyperdense vessel. Skull: Negative. Sinuses/Orbits: Incomplete coverage of the orbits without acute finding. Bilateral cataract resection. Other: These results were called by telephone at the time of interpretation on 03/27/2017 at 2:37 pm to Dr. Scotty Court , who verbally acknowledged these results. ASPECTS [facility] Stroke Program Early CT Score) Not scored with this symptomatology IMPRESSION: 1. No acute finding or change from prior. 2. 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 Electronically Signed   By: Marnee Spring M.D.   On: March 27, 2017 14:42    Microbiology Recent Results (from the past 240 hour(s))  MRSA PCR Screening     Status: Abnormal   Collection Time: 03/13/17  6:31 AM  Result Value Ref Range Status   MRSA by PCR POSITIVE (A) NEGATIVE Final    Comment:        The GeneXpert MRSA Assay (FDA approved for NASAL specimens only), is one component of a comprehensive MRSA colonization surveillance program. It is not intended to diagnose MRSA infection nor to guide or monitor treatment for MRSA infections. RESULT CALLED TO, READ BACK BY AND VERIFIED WITH: Zettie Cooley @ [phone] 03/13/17 by [facility]   CULTURE, BLOOD (ROUTINE X 2) w Reflex  to ID Panel     Status: None (Preliminary result)   Collection Time:  03/15/17 11:08 PM  Result Value Ref Range Status   Specimen Description BLOOD LEFT HAND  Final   Special Requests   Final    BOTTLES DRAWN AEROBIC AND ANAEROBIC Blood Culture adequate volume   Culture NO GROWTH < 12 HOURS  Final   Report Status PENDING  Incomplete  CULTURE, BLOOD (ROUTINE X 2) w Reflex to ID Panel     Status: None (Preliminary result)   Collection Time: 03/15/17 11:08 PM  Result Value Ref Range Status   Specimen Description BLOOD RIGHT HAND  Final   Special Requests   Final    BOTTLES DRAWN AEROBIC AND ANAEROBIC Blood Culture adequate volume   Culture NO GROWTH < 12 HOURS  Final   Report Status PENDING  Incomplete    Lab Basic Metabolic Panel:  Recent Labs Lab 03/13/17 0025 03/14/17 0329 03/15/17 2308  NA  --  131* 129*  K  --  4.1 5.3*  CL  --  88* 87*  CO2  --  31 31  GLUCOSE  --  140* 148*  BUN  --  15 43*  CREATININE 0.55 0.55 1.30*  CALCIUM  --  9.8 9.4   Liver Function Tests: No results for input(s): AST, ALT, ALKPHOS, BILITOT, PROT, ALBUMIN in the last 168 hours. No results for input(s): LIPASE, AMYLASE in the last 168 hours. No results for input(s): AMMONIA in the last 168 hours. CBC:  Recent Labs Lab 03/13/17 0025 03/14/17 0329 03/15/17 2308  WBC 7.2 10.9 13.1*  HGB 10.3* 11.5* 11.2*  HCT 30.8* 33.5* 32.8*  MCV 86.3 85.5 84.7  PLT 230 269 227   Cardiac Enzymes:  Recent Labs Lab 03/13/17 0025 03/13/17 0613 03/13/17 1143  TROPONINI 0.03* 0.03* 0.03*   Sepsis Labs:  Recent Labs Lab 03/13/17 0025 03/14/17 0329 03/15/17 2201 03/15/17 2308 April 11, 2017 0031  WBC 7.2 10.9  --  13.1*  --   LATICACIDVEN  --   --  4.1*  --  1.9    Procedures/Operations  None   Maye Parkinson Sherryll Burger Apr 11, 2017, 4:11 PM

## 2017-04-12 NOTE — Progress Notes (Signed)
Patient was noted for significant decline in vital signs: Febrile,body temperature of 101.3 axillary, respiratory rate of up to 28 breaths/min, heart rate in 120s to 140s and an oxygen saturation of 75 on 2LPM/[place]. Patient repositioned and placed on high fowlers position, oxygen flow increased to 4LPM/[place]. Oxygen level went up to 94% on 4LPM/[place].  Patient also noted to be minimally responsive and with a distended abdomen. Referred the matter to Dr. Emmit PomfretHugelmeyer. Orders were made and carried out; NS 250 bolus I.V administered, Lactic acid and other blood works, xray chest and abdomen done. Tylenol 650mg  Suppository administered. Started patient on IV antibiotics (Zosyn and Vancomycin).  Patient kept safe and comfortable. Needs attended. Will continue to monitor.

## 2017-04-12 DEATH — deceased

## 2017-09-22 ENCOUNTER — Ambulatory Visit: Payer: Medicare Other | Admitting: Urology
